# Patient Record
Sex: Male | Born: 1970 | Hispanic: Yes | Smoking: Former smoker
Health system: Southern US, Community
[De-identification: ages and names within clinical notes are randomized; demographics above are authoritative.]

## PROBLEM LIST (undated history)

## (undated) ENCOUNTER — Ambulatory Visit (HOSPITAL_COMMUNITY): Discharge status: Absent without leave | Payer: Self-pay

## (undated) DIAGNOSIS — K429 Umbilical hernia without obstruction or gangrene: Secondary | ICD-10-CM

## (undated) DIAGNOSIS — F419 Anxiety disorder, unspecified: Secondary | ICD-10-CM

## (undated) DIAGNOSIS — J45909 Unspecified asthma, uncomplicated: Secondary | ICD-10-CM

## (undated) DIAGNOSIS — B029 Zoster without complications: Secondary | ICD-10-CM

## (undated) DIAGNOSIS — B009 Herpesviral infection, unspecified: Secondary | ICD-10-CM

## (undated) DIAGNOSIS — K219 Gastro-esophageal reflux disease without esophagitis: Secondary | ICD-10-CM

## (undated) HISTORY — PX: MOUTH SURGERY: SHX715

---

## 2004-10-08 ENCOUNTER — Emergency Department (HOSPITAL_COMMUNITY): Admission: EM | Admit: 2004-10-08 | Discharge: 2004-10-08 | Payer: Self-pay | Admitting: Emergency Medicine

## 2005-04-05 ENCOUNTER — Emergency Department (HOSPITAL_COMMUNITY): Admission: EM | Admit: 2005-04-05 | Discharge: 2005-04-06 | Payer: Self-pay | Admitting: Emergency Medicine

## 2005-04-14 ENCOUNTER — Emergency Department (HOSPITAL_COMMUNITY): Admission: EM | Admit: 2005-04-14 | Discharge: 2005-04-15 | Payer: Self-pay | Admitting: Emergency Medicine

## 2005-06-06 ENCOUNTER — Emergency Department (HOSPITAL_COMMUNITY): Admission: EM | Admit: 2005-06-06 | Discharge: 2005-06-06 | Payer: Self-pay | Admitting: Emergency Medicine

## 2005-07-28 ENCOUNTER — Emergency Department (HOSPITAL_COMMUNITY): Admission: EM | Admit: 2005-07-28 | Discharge: 2005-07-28 | Payer: Self-pay | Admitting: Emergency Medicine

## 2006-02-08 ENCOUNTER — Emergency Department (HOSPITAL_COMMUNITY): Admission: EM | Admit: 2006-02-08 | Discharge: 2006-02-08 | Payer: Self-pay | Admitting: Emergency Medicine

## 2006-08-26 ENCOUNTER — Emergency Department (HOSPITAL_COMMUNITY): Admission: EM | Admit: 2006-08-26 | Discharge: 2006-08-26 | Payer: Self-pay | Admitting: Emergency Medicine

## 2006-09-15 ENCOUNTER — Emergency Department (HOSPITAL_COMMUNITY): Admission: EM | Admit: 2006-09-15 | Discharge: 2006-09-15 | Payer: Self-pay | Admitting: Emergency Medicine

## 2006-09-25 ENCOUNTER — Emergency Department (HOSPITAL_COMMUNITY): Admission: EM | Admit: 2006-09-25 | Discharge: 2006-09-25 | Payer: Self-pay | Admitting: Emergency Medicine

## 2008-01-23 ENCOUNTER — Emergency Department (HOSPITAL_COMMUNITY): Admission: EM | Admit: 2008-01-23 | Discharge: 2008-01-23 | Payer: Self-pay | Admitting: Emergency Medicine

## 2010-05-16 ENCOUNTER — Emergency Department (HOSPITAL_COMMUNITY)
Admission: EM | Admit: 2010-05-16 | Discharge: 2010-05-16 | Disposition: A | Discharge status: Home / Self Care | Payer: Self-pay | Attending: Emergency Medicine | Admitting: Emergency Medicine

## 2010-05-16 DIAGNOSIS — Y929 Unspecified place or not applicable: Secondary | ICD-10-CM | POA: Insufficient documentation

## 2010-05-16 DIAGNOSIS — S91009A Unspecified open wound, unspecified ankle, initial encounter: Secondary | ICD-10-CM | POA: Insufficient documentation

## 2010-05-16 DIAGNOSIS — M79609 Pain in unspecified limb: Secondary | ICD-10-CM | POA: Insufficient documentation

## 2010-05-16 DIAGNOSIS — IMO0002 Reserved for concepts with insufficient information to code with codable children: Secondary | ICD-10-CM | POA: Insufficient documentation

## 2010-05-16 DIAGNOSIS — W268XXA Contact with other sharp object(s), not elsewhere classified, initial encounter: Secondary | ICD-10-CM | POA: Insufficient documentation

## 2010-05-16 DIAGNOSIS — S81009A Unspecified open wound, unspecified knee, initial encounter: Secondary | ICD-10-CM | POA: Insufficient documentation

## 2010-05-16 LAB — GLUCOSE, CAPILLARY: Glucose-Capillary: 119 mg/dL — ABNORMAL HIGH (ref 70–99)

## 2010-05-22 ENCOUNTER — Emergency Department (HOSPITAL_COMMUNITY)
Admission: EM | Admit: 2010-05-22 | Discharge: 2010-05-22 | Disposition: A | Discharge status: Home / Self Care | Payer: Self-pay | Attending: Emergency Medicine | Admitting: Emergency Medicine

## 2010-05-22 DIAGNOSIS — J029 Acute pharyngitis, unspecified: Secondary | ICD-10-CM | POA: Insufficient documentation

## 2010-05-22 DIAGNOSIS — J3489 Other specified disorders of nose and nasal sinuses: Secondary | ICD-10-CM | POA: Insufficient documentation

## 2010-05-22 DIAGNOSIS — R05 Cough: Secondary | ICD-10-CM | POA: Insufficient documentation

## 2010-05-22 DIAGNOSIS — W268XXA Contact with other sharp object(s), not elsewhere classified, initial encounter: Secondary | ICD-10-CM | POA: Insufficient documentation

## 2010-05-22 DIAGNOSIS — S81009A Unspecified open wound, unspecified knee, initial encounter: Secondary | ICD-10-CM | POA: Insufficient documentation

## 2010-05-22 DIAGNOSIS — J069 Acute upper respiratory infection, unspecified: Secondary | ICD-10-CM | POA: Insufficient documentation

## 2010-05-22 DIAGNOSIS — Z09 Encounter for follow-up examination after completed treatment for conditions other than malignant neoplasm: Secondary | ICD-10-CM | POA: Insufficient documentation

## 2010-05-22 DIAGNOSIS — R059 Cough, unspecified: Secondary | ICD-10-CM | POA: Insufficient documentation

## 2010-07-28 ENCOUNTER — Emergency Department (HOSPITAL_COMMUNITY)
Admission: EM | Admit: 2010-07-28 | Discharge: 2010-07-28 | Disposition: A | Discharge status: Home / Self Care | Payer: Self-pay | Attending: Emergency Medicine | Admitting: Emergency Medicine

## 2010-07-28 DIAGNOSIS — H579 Unspecified disorder of eye and adnexa: Secondary | ICD-10-CM | POA: Insufficient documentation

## 2010-07-28 DIAGNOSIS — H5789 Other specified disorders of eye and adnexa: Secondary | ICD-10-CM | POA: Insufficient documentation

## 2010-07-28 DIAGNOSIS — H1089 Other conjunctivitis: Secondary | ICD-10-CM | POA: Insufficient documentation

## 2010-09-24 ENCOUNTER — Emergency Department (HOSPITAL_COMMUNITY)
Admission: EM | Admit: 2010-09-24 | Discharge: 2010-09-24 | Disposition: A | Discharge status: Home / Self Care | Payer: Self-pay | Attending: Emergency Medicine | Admitting: Emergency Medicine

## 2010-09-24 DIAGNOSIS — L293 Anogenital pruritus, unspecified: Secondary | ICD-10-CM | POA: Insufficient documentation

## 2010-09-24 DIAGNOSIS — B356 Tinea cruris: Secondary | ICD-10-CM | POA: Insufficient documentation

## 2010-11-02 LAB — URINALYSIS, ROUTINE W REFLEX MICROSCOPIC
Glucose, UA: NEGATIVE
Hgb urine dipstick: NEGATIVE
Ketones, ur: 15 — AB
Urobilinogen, UA: 1
pH: 6

## 2010-11-02 LAB — URINE CULTURE

## 2011-06-18 ENCOUNTER — Emergency Department (HOSPITAL_COMMUNITY)
Admission: EM | Admit: 2011-06-18 | Discharge: 2011-06-18 | Disposition: A | Discharge status: Home / Self Care | Payer: Self-pay | Attending: Emergency Medicine | Admitting: Emergency Medicine

## 2011-06-18 ENCOUNTER — Encounter (HOSPITAL_COMMUNITY): Payer: Self-pay

## 2011-06-18 DIAGNOSIS — K130 Diseases of lips: Secondary | ICD-10-CM | POA: Insufficient documentation

## 2011-06-18 HISTORY — DX: Herpesviral infection, unspecified: B00.9

## 2011-06-18 LAB — CBC
HCT: 44.5 % (ref 39.0–52.0)
MCH: 30.1 pg (ref 26.0–34.0)
MCHC: 36 g/dL (ref 30.0–36.0)
Platelets: 238 10*3/uL (ref 150–400)

## 2011-06-18 LAB — URINALYSIS, ROUTINE W REFLEX MICROSCOPIC
Glucose, UA: NEGATIVE mg/dL
Ketones, ur: NEGATIVE mg/dL
Nitrite: NEGATIVE
Protein, ur: NEGATIVE mg/dL
Specific Gravity, Urine: 1.02 (ref 1.005–1.030)
Urobilinogen, UA: 0.2 mg/dL (ref 0.0–1.0)

## 2011-06-18 LAB — DIFFERENTIAL
Basophils Absolute: 0 10*3/uL (ref 0.0–0.1)
Lymphocytes Relative: 27 % (ref 12–46)
Lymphs Abs: 2 10*3/uL (ref 0.7–4.0)
Monocytes Absolute: 0.6 10*3/uL (ref 0.1–1.0)
Monocytes Relative: 9 % (ref 3–12)
Neutrophils Relative %: 61 % (ref 43–77)

## 2011-06-18 LAB — BASIC METABOLIC PANEL
Calcium: 9 mg/dL (ref 8.4–10.5)
Chloride: 103 mEq/L (ref 96–112)
GFR calc Af Amer: >90 mL/min (ref >90)
Potassium: 3.7 mEq/L (ref 3.5–5.1)
Sodium: 139 mEq/L (ref 135–145)

## 2011-06-18 MED ORDER — TRAMADOL HCL 50 MG PO TABS: 50.0000 mg | ORAL_TABLET | Freq: Four times a day (QID) | ORAL | Status: AC | PRN

## 2011-06-18 MED ORDER — NAPROXEN 500 MG PO TABS: 500.0000 mg | ORAL_TABLET | Freq: Two times a day (BID) | ORAL | Status: DC

## 2011-06-18 NOTE — ED Provider Notes (Signed)
History     CSN: [id number]  Arrival date & time 06/18/11  1346   First MD Initiated Contact with Patient 06/18/11 1401      Chief Complaint  Patient presents with  . Penis Pain    (Consider location/radiation/quality/duration/timing/severity/associated sxs/prior treatment) Patient is a 41 y.o. male presenting with penile pain. The history is provided by the patient. A language interpreter was used.  Penis Pain  He complains of pain and itching in his mouth, tongue, and groin for the last 2 months. He states he sees blisters on his tongue and sometimes his lips are cracked. His mouth is dry all the time and he has been very thirsty and been drinking a lot. He has been urinating a lot. He thinks that the pain is due 2 herpes. He has also had a groin pain and itching. He denies any discharge and has not noted any blisters in his groin. He denies fever, chills, sweats. He denies nausea, vomiting, diarrhea. Denies arthralgias and myalgias.  Past Medical History  Diagnosis Date  . Herpes     History reviewed. No pertinent past surgical history.  No family history on file.  History  Substance Use Topics  . Smoking status: Never Smoker   . Smokeless tobacco: Not on file  . Alcohol Use: Yes      Review of Systems  Genitourinary: Positive for penile pain.  All other systems reviewed and are negative.    Allergies  Review of patient's allergies indicates no known allergies.  Home Medications  No current outpatient prescriptions on file.  BP 113/45  Pulse 88  Temp(Src) 99.1 F (37.3 C) (Oral)  Resp 14  Ht 5\' 7"  (1.702 m)  Wt 180 lb (81.647 kg)  BMI 28.19 kg/m2  SpO2 98%  Physical Exam  Nursing note and vitals reviewed.  41 year old male resting comfortably and in no acute distress. Vital signs are normal. Oxygen saturation is 98% which is normal. Head is normocephalic and atraumatic. PERRLA, EOMI. Oral cavity appears normal. I do not see any vesicles on the tongue  or buccal mucosa. Lips appear normal without any cracking or redness or vesicles. Neck is nontender and supple without adenopathy. Back is nontender. Lungs are clear without rales, wheezes, or rhonchi. Heart has regular rate and rhythm without murmur. Abdomen is soft, flat, nontender without masses or hepatosplenomegaly. Genitalia: Uncircumcised penis without discharge. Doses are descended without masses. I do not see any scrotal rash. There is no inguinal adenopathy. Extremities have full range of motion, no cyanosis or edema. Skin is warm and dry without rash. Neurologic: Mental status is normal, cranial nerves are intact, there no focal motor or sensory deficits.  ED Course  Procedures (including critical care time)  Results for orders placed during the hospital encounter of 06/18/11  URINALYSIS, ROUTINE W REFLEX MICROSCOPIC      Component Value Range   Color, Urine YELLOW  YELLOW    APPearance CLEAR  CLEAR    Specific Gravity, Urine 1.020  1.005 - 1.030    pH 8.0  5.0 - 8.0    Glucose, UA NEGATIVE  NEGATIVE (mg/dL)   Hgb urine dipstick NEGATIVE  NEGATIVE    Bilirubin Urine NEGATIVE  NEGATIVE    Ketones, ur NEGATIVE  NEGATIVE (mg/dL)   Protein, ur NEGATIVE  NEGATIVE (mg/dL)   Urobilinogen, UA 0.2  0.0 - 1.0 (mg/dL)   Nitrite NEGATIVE  NEGATIVE    Leukocytes, UA NEGATIVE  NEGATIVE   CBC  Component Value Range   WBC 7.3  4.0 - 10.5 (K/uL)   RBC 5.32  4.22 - 5.81 (MIL/uL)   Hemoglobin 16.0  13.0 - 17.0 (g/dL)   HCT 46.9  62.9 - 52.8 (%)   MCV 83.6  78.0 - 100.0 (fL)   MCH 30.1  26.0 - 34.0 (pg)   MCHC 36.0  30.0 - 36.0 (g/dL)   RDW 41.3  24.4 - 01.0 (%)   Platelets 238  150 - 400 (K/uL)  DIFFERENTIAL      Component Value Range   Neutrophils Relative 61  43 - 77 (%)   Neutro Abs 4.4  1.7 - 7.7 (K/uL)   Lymphocytes Relative 27  12 - 46 (%)   Lymphs Abs 2.0  0.7 - 4.0 (K/uL)   Monocytes Relative 9  3 - 12 (%)   Monocytes Absolute 0.6  0.1 - 1.0 (K/uL)   Eosinophils Relative 3   0 - 5 (%)   Eosinophils Absolute 0.2  0.0 - 0.7 (K/uL)   Basophils Relative 0  0 - 1 (%)   Basophils Absolute 0.0  0.0 - 0.1 (K/uL)  BASIC METABOLIC PANEL      Component Value Range   Sodium 139  135 - 145 (mEq/L)   Potassium 3.7  3.5 - 5.1 (mEq/L)   Chloride 103  96 - 112 (mEq/L)   CO2 25  19 - 32 (mEq/L)   Glucose, Bld 97  70 - 99 (mg/dL)   BUN 8  6 - 23 (mg/dL)   Creatinine, Ser 2.72  0.50 - 1.35 (mg/dL)   Calcium 9.0  8.4 - 53.6 (mg/dL)   GFR calc non Af Amer >90  >90 (mL/min)   GFR calc Af Amer >90  >90 (mL/min)    1. Cheilitis       MDM  Dry mouth with the intermittent pain with reports of polyuria and polydipsia worrisome for new onset of diabetes. Urinalysis and metabolic panel will be checked. I do not see any lesions that are suggestive of herpes.   Workup is negative including normal blood sugar. It is possible that his complaints of constant thirst and dry mouth may be early presentation of Sjogren's syndrome. He is referred to ENT for followup of his oral pain and urine prescriptions for naproxen and tramadol.     Dione Booze, MD 06/18/11 [phone]

## 2011-06-18 NOTE — Discharge Instructions (Signed)
I do not see any sign of herpes infection. Follow up with the ENT doctor.  Naproxen and naproxen sodium oral immediate-release tablets Qu es este medicamento? El NAPROXENO es un medicamento antiinflamatorio no esteroideo (AINE). Se utiliza para reducir la inflamacin y Corporate treasurer. Este medicamento se puede Chemical engineer para Nurse, adult, dolor de [place] y perodos menstruales dolorosos. Este medicamento tambin sirve para tratar problemas dolorosos de las articulaciones y los msculos, tales como artritis, tendinitis, bursitis y Secondary school teacher. Este medicamento puede ser utilizado para otros usos; si tiene alguna pregunta consulte con su proveedor de atencin mdica o con su farmacutico. Qu le debo informar a mi profesional de la salud antes de tomar este medicamento? Necesita saber si usted presenta alguno de los siguientes problemas o situaciones: -asma -fuma -consume ms de 3 bebidas alcohlicas por da -enfermedad cardiaca o problemas circulatorios, tales como insuficiencia cardiaca o edema de pierna (retencin de lquido) -alta presin sangunea -enfermedad renal -enfermedad heptica -lceras o sangrado estomacal -una reaccin alrgica o inusual al naproxeno, a la aspirina, a otros AINE, otros medicamentos, alimentos, colorantes o conservantes -si est embarazada o buscando quedar embarazada -si est amamantando a un beb Cmo debo [employer]? Tome este medicamento por va oral con un vaso de agua. Siga las instrucciones de la etiqueta del [place]. Si este medicamento le produce Programme researcher, broadcasting/film/video, tmelo con alimentos. Trate de no acostarse por lo menos 10 minutos despus de tomarlo. Tome sus dosis a intervalos regulares. No tome su medicamento con una frecuencia mayor a la indicada. El uso prolongado puede aumentar el riesgo de sufrir un ataque cardiaco o un derrame cerebral. Su farmacutico le dar una [place] del medicamento especial con cada receta y relleno. Asegrese  de leer esta informacin cada vez cuidadosamente. Hable con su pediatra para informarse acerca del uso de este medicamento en nios. Puede requerir atencin especial. Sobredosis: Pngase en contacto inmediatamente con un centro toxicolgico o una sala de urgencia si usted cree que haya tomado demasiado medicamento. ATENCIN: [employer] es solo para usted. No comparta este medicamento con nadie. Qu sucede si me olvido de una dosis? Si olvida una dosis, tmela lo antes posible. Si es casi la hora de la prxima dosis, tome slo esa dosis. No tome dosis adicionales o dobles. Qu puede interactuar con este medicamento? -alcohol -aspirina -cidofovir -diurticos -litio -metotrexato -otros medicamentos antiinflamatorios, tales Teacher, English as a foreign language o prednisona -pemetrexed -probenecid -warfarina Puede ser que esta lista no menciona todas las posibles interacciones. Informe a su profesional de [employer] de [employer] productos a base de hierbas, medicamentos de [place] o suplementos nutritivos que est tomando. Si usted fuma, consume bebidas alcohlicas o si utiliza drogas ilegales, indqueselo tambin a su profesional de [employer]. Algunas sustancias pueden interactuar con su medicamento. A qu debo estar atento al usar [employer]? Si el dolor no mejora, informe a su mdico o a su profesional de [employer]. Consulte con su mdico antes de tomar otros analgsicos. No se trate usted mismo. Este medicamento no previene ataques cardiacos o derrames cerebrales. De hecho, este medicamento puede aumentar la posibilidad de Marine scientist un ataque cardiaco o un derrame cerebral. La posibilidad puede aumentar con el uso prolongado de este medicamento y en pacientes con enfermedad cardiaca. Si est tomando aspirina para la prevencin de ataques cardiacos o derrames cerebrales, comunquese con su mdico o su profesional de [employer]. Evite tomar otros medicamentos que contienen aspirina, ibuprofeno o naproxeno  con [employer]. Es probable que se  produzcan efectos secundarios, tales como molestias estomacales, nuseas o lceras. No debe de tomar [employer] con muchos medicamentos disponibles de [employer]. Este medicamento puede provocar lceras y hemorragia del estmago e intestinos en cualquier momento durante tratamiento. No fume ni ingiera alcohol. Esto irrita an ms el estmago y puede hacerlo ms susceptible a dao por el uso de [employer]. Pueden ocurrir lceras y hemorragia sin sntomas de Control and instrumentation engineer y Financial controller la [place]. Puede experimentar mareos o somnolencia. No conduzca ni utilice maquinaria ni haga nada que Scientist, research (life sciences) en estado de alerta hasta que sepa cmo le afecta este medicamento. No se siente ni se ponga de pie con rapidez, especialmente si es un paciente de edad avanzada. Esto reduce el riesgo de mareos o [employer]. Este medicamento puede hacerle sangrar con mayor facilidad. Trate de no lastimarse los dientes y las encas al cepillarlos o limpiarlos con hilo dental. Qu efectos secundarios puedo tener al [employer] este medicamento? Efectos secundarios que debe informar a su mdico o a Producer, television/film/video de la salud tan pronto como sea posible: -heces de color oscuro o con sangre, sangre en la orina o vmito con sangre -visin borrosa -dolor en el pecho -dificultad al respirar o sibilancias -nuseas o vmito -dolor de estmago severo -erupcin cutnea, enrojecimiento, ampollas o descamacin de la piel, urticarias o picazn -hablar arrastrando las palabras o debilidad en un lado del cuerpo -hinchazn de prpados, garganta o labios -aumento de peso o hinchazn que no tienen explicacin -cansancio o debilidad inusual -color amarillento de los ojos o la piel Efectos secundarios que, por lo general, no requieren atencin mdica (debe informarlos a su mdico o a su profesional de la salud si persisten o si son molestos): -estreimiento -dolor de cabeza -acidez de  estmago Puede ser que esta lista no menciona todos los posibles efectos secundarios. Comunquese a su mdico por asesoramiento mdico [employer]. Usted puede informar los efectos secundarios a la FDA por telfono al 1-800-FDA-1088. Dnde debo guardar mi medicina? Mantngala fuera del alcance de los nios. Gurdela a [employer], entre 15 y 30 grados C (14 y 48 grados F). Mantenga le envase bien cerrado. Deseche todo el medicamento que no haya utilizado, despus de la fecha de vencimiento. ATENCIN: Este folleto es un resumen. Puede ser que no cubra toda la posible informacin. Si usted tiene preguntas acerca de esta medicina, consulte con su mdico, su farmacutico o su profesional de Radiographer, therapeutic.  2012, Elsevier/Gold Standard. (02/10/2009 4:00:30 PM)  Tramadol tablets Qu es este medicamento? El TRAMADOL es un analgsico. Se utiliza para tratar dolores moderados o severos en adultos. Este medicamento puede ser utilizado para otros usos; si tiene alguna pregunta consulte con su proveedor de atencin mdica o con su farmacutico. Qu le debo informar a mi profesional de la salud antes de tomar este medicamento? Necesita saber si usted presenta alguno de los [employer] o situaciones: -tumor cerebral -depresin  -abuso de drogas o drogadiccin -lesin de la cabeza -si consume bebidas alcohlicas con frecuencia -enfermedad renal o problemas al orinar -enfermedad heptica -enfermedad pulmonar, asma o problemas respiratorios -convulsiones o epilepsia -ideas suicidas, planes o intento; si usted o alguien de su familia ha intentado un suicidio previo -una reaccin alrgica o inusual al tramadol, a la codena, a otros medicamentos, alimentos, colorantes o conservantes -si est embarazada o buscando quedar embarazada -si est amamantando a un beb Cmo debo utilizar este medicamento? Tome este medicamento por va oral con un vaso lleno de agua.  Siga las  instrucciones de la etiqueta del [place]. Si el Social worker, tmelo con alimentos o con [place]. No tome su medicamento con una frecuencia mayor que la indicada. Hable con su pediatra para informarse acerca del uso de este medicamento en nios. Puede requerir atencin especial. Sobredosis: Pngase en contacto inmediatamente con un centro toxicolgico o una sala de urgencia si usted cree que haya tomado demasiado medicamento. ATENCIN: [employer] es solo para usted. No comparta este medicamento con nadie. Qu sucede si me olvido de una dosis? Si olvida una dosis, tmela lo antes posible. Si es casi la hora de la prxima dosis, tome slo esa dosis. No tome dosis adicionales o dobles. Qu puede interactuar con este medicamento? No tome esta medicina con ninguno de los siguientes medicamentos: -IMAOs, tales como Carbex, Eldepryl, Marplan, [place] y [place] Esta medicina tambin puede interactuar con los siguientes medicamentos: -alcohol o medicamentos que contienen alcohol -antihistamnicos -benzodiacepinas -bupropion -carbamazepina u oxcarbazepina -clozapina -ciclobenzaprina -digoxina -furazolidona -linezolid -medicamentos para la depresin, ansiedad o trastornos psicticos -medicamentos para las migraas, tales como almotriptn, eletriptn, frovatriptn, naratriptn, rizatriptn, sumatriptn, zolmitriptn -analgsicos, incluyendo pentazocina, buprenorfina, butorfanol, meperidina, nalbufina y propoxifeno -medicamentos para conciliar el sueo -relajantes musculares -naltrexona -fenobarbital -fenotiazinas, tales como perfenacina, tioridazina, clorpromacina, mesoridazina, flufenazina, proclorperazina, promazina y trifluoperazina -procarbazina -warfarina Puede ser que esta lista no menciona todas las posibles interacciones. Informe a su profesional de [employer] de [employer] productos a base de hierbas, medicamentos de [place] o suplementos nutritivos  que est tomando. Si usted fuma, consume bebidas alcohlicas o si utiliza drogas ilegales, indqueselo tambin a su profesional de [employer]. Algunas sustancias pueden interactuar con su medicamento. A qu debo estar atento al usar [employer]? Si el dolor no desaparece, si empeora o si experimenta un dolor nuevo o de tipo diferente, consulte a su mdico o a su profesional de [employer]. Usted puede desarrollar tolerancia al medicamento. La tolerancia significa que necesitar una dosis ms alta para Engineer, materials. Tolerancia es normal y esperada cuando est tomando este medicamento por un largo perodo de [place]. No suspenda el uso de su medicamento repentinamente debido a que puede Copywriter, advertising reaccin severa. Su cuerpo se acostumbra a Industrial/product designer. Esto NO significa que sea adicto. La adiccin es un comportamiento que hace referencia a la obtencin y utilizacin de un medicamento con fines que no son mdicos. Si tiene Engineer, mining, existe una razn mdica para que usted tome un analgsico. Su mdico le indicar la cantidad de medicamento que Mudlogger. Si su mdico desea que [employer], la dosis ser reducida gradualmente para Psychiatric nurse secundarios. Puede experimentar mareos o somnolencia. No conduzca ni utilice maquinaria, ni haga nada que Scientist, research (life sciences) en estado de alerta hasta que sepa cmo le afecta este medicamento. No se siente ni se ponga de pie con rapidez, especialmente si es un paciente de edad avanzada. Esto reduce el riesgo de mareos o [employer]. El alcohol puede aumentar o disminuir el efecto de [place]. Evite consumir bebidas alcohlicas. Este medicamento puede causar estreimiento. Trate de evacuar los intestinos al menos cada 2  3 das. Si no evacua los intestinos durante [address]  [address], comunquese con su mdico o con su profesional de [employer]. Se le podr secar la boca. Masticar chicle sin azcar, chupar caramelos duros y tomar agua en abundancia le  ayudar a mantener la boca hmeda. Si el problema no desaparece o es severo, consulte a su mdico. Qu efectos secundarios puedo  tener al [employer] este medicamento? Efectos secundarios que debe informar a su mdico o a Producer, television/film/video de la salud tan pronto como sea posible: -Therapist, art como erupcin cutnea, picazn o urticarias, hinchazn de la cara, labios o lengua -dificultades respiratorias, sibilancias -confusin -picazn -aturdimiento o desmayos -enrojecimiento, formacin de ampollas, descamacin o aflojamiento de la piel, inclusive dentro de la boca -convulsiones Efectos secundarios que, por lo general, no requieren atencin mdica (debe informarlos a su mdico o a su profesional de la salud si persisten o si son molestos): -estreimiento -mareos -somnolencia -dolor de cabeza -nuseas, vmitos Puede ser que esta lista no menciona todos los posibles efectos secundarios. Comunquese a su mdico por asesoramiento mdico [employer]. Usted puede informar los efectos secundarios a la FDA por telfono al 1-800-FDA-1088. Dnde debo guardar mi medicina? Mantngala fuera del alcance de los nios. Gurdela a [employer], entre 15 y 30 grados C (41 y 43 grados F). Mantenga el envase bien cerrado. Deseche los medicamentos que no haya utilizado, despus de la fecha de vencimiento. ATENCIN: Este folleto es un resumen. Puede ser que no cubra toda la posible informacin. Si usted tiene preguntas acerca de esta medicina, consulte con su mdico, su farmacutico o su profesional de Radiographer, therapeutic.  2012, Elsevier/Gold Standard. (10/15/2009 5:11:17 PM)

## 2011-06-18 NOTE — ED Notes (Signed)
Pt presents with 2 month h/o penile pain.  Pt reports "I have herpes" reports to mouth and penis.

## 2011-06-28 ENCOUNTER — Encounter (HOSPITAL_COMMUNITY): Payer: Self-pay

## 2011-06-28 ENCOUNTER — Emergency Department (HOSPITAL_COMMUNITY)
Admission: EM | Admit: 2011-06-28 | Discharge: 2011-06-28 | Disposition: A | Discharge status: Home / Self Care | Payer: Self-pay | Attending: Emergency Medicine | Admitting: Emergency Medicine

## 2011-06-28 DIAGNOSIS — R220 Localized swelling, mass and lump, head: Secondary | ICD-10-CM

## 2011-06-28 DIAGNOSIS — K0510 Chronic gingivitis, plaque induced: Secondary | ICD-10-CM | POA: Insufficient documentation

## 2011-06-28 NOTE — ED Notes (Signed)
Lips are swollen and red. Airway patent

## 2011-06-28 NOTE — Discharge Instructions (Signed)
Gingivitis  (Gingivitis) La gingivitis es una forma de enfermedad de las encas (periodontal) que causa enrojecimiento, dolor e hinchazn (inflamacin).  CAUSAS  La causa ms comn es la mala higiene bucal. Se deposita una sustancia pegajosa formada por bacterias, mucus y partculas de comida (placa) se deposita en la parte expuesta del diente. A medida que la placa se forma, reacciona con la saliva dentro de la boca para formar lo que se denomina sarro .El sarro es un depsito duro que queda pegado alrededor de la base del diente. La placa y el sarro irritan las encas, y conduce a la aparicin de la gingivitis. Otros factores que aumentan el riesgo de gingivitis son:   Consumo de tabaco.   Diabetes.   La edad avanzada.   Ciertos medicamentos.   Ciertas infecciones virales o por hongos.   [employer].   Cambios hormonales [employer] se producen durante el [place].   Dficit nutricional.   Abuso de drogas.   Arreglos dentales o prtesis que no ajustan bien.  SNTOMAS  Nota inflamacin del tejido blando (gingiva) alrededor del diente. Cuando estos tejidos se inflaman, sangran con facilidad, especialmente durante la limpieza con hilo dental o el cepillado. Tambin las encas podrn estar:   Sensibles al tacto.   Tener un color rojo brillante, prpura o estar brillantes.   Hinchadas   Separarse del diente (retroceder) lo que hace que el diente se exponga an ms.  Con frecuencia hay mal aliento. Una infeccin continua alrededor del diente puede Interior and spatial designer caries y hacer que el diente se afloje. Esto puede causarle la prdida del diente.  DIAGNSTICO  Deben tomarle una historia clnica y dental. Le examinarn la boca, los dientes y las encas. El dentista observar las encas para ver si estn blandas, hinchadas, de color prpura o rojas o irritadas. Puede haber depsitos de placa y sarro en la base del diente. Le evaluarn las encas para ver el grado de irritacin,  hinchazn o tendencia a la hemorragia. El dentista controlar si hay dientes flojos. Le tomarn radiografas para ver si la inflamacin se ha extendido a Nurse, learning disability.  TRATAMIENTO  El [place] del tratamiento es reducir y Hydrographic surveyor. Un tratamiento adecuado puede [employer] sntomas de la gingivitis y evitar el avance de la enfermedad. Hgase una limpieza de dientes. Durante la limpieza le quitarn toda la placa y el sarro. Le darn instrucciones para un cuidado Librarian, academic. Necesitar limpiezas profesionales y Forensic scientist futuro.  CUIDADOS EN EL HOGAR   Cepllese los dientes al [employer] veces por da y psese el hilo dental al menos una vez por da. Cuando se pase el hilo dental, es conveniente hacerlo antes del cepillado.   Limite el consumo de azcar entre comidas y Mindi Slicker dieta equilibrada.   La mejor higiene bucal no impedir que la placa se desarrolle. Es necesario que consulte con su odontlogo regularmente, segn se lo sugieran para Education officer, environmental limpiezas y Paediatric nurse.   El dentista lo asesorar acerca de la correcta higiene bucal y el cuidado de la boca y sugerir dentfricos o enjuagues bucales.   Deje de fumar.  SOLICITE ATENCIN ODONTOLGICA O MDICA SI:   Siente dolor u observa enrojecimiento en los tejidos que rodean los dientes o tiene las encas hinchadas.   Tiene dificultad para Product manager.   Nota que algn diente est flojo o infectado.   Tiene los ganglios hinchados.   Las [employer] sangran con facilidad cuando  se cepilla los dientes o las siente muy sensibles al tacto.  Document Released: 01/04/2005 Document Revised: 12/24/2010 [facility] Patient Information 2012 [place], [place].

## 2011-06-28 NOTE — ED Provider Notes (Signed)
History    This chart was scribed for Gabriel Gaskins, MD, MD by Smitty Pluck. The patient was seen in room STRE5 and the patient's care was started at 2:47PM.   CSN: [id number]  Arrival date & time 06/28/11  1420   First MD Initiated Contact with Patient 06/28/11 1445      Chief Complaint  Patient presents with  . Facial Swelling     The history is provided by the patient. A language interpreter was used.   Iam Lipson is a 41 y.o. male who presents to the Emergency Department complaining of intermittent mild lip swelling, itching and pain onset 2 months ago. Pt reports not responding to medication. Denies vomiting, difficulty breathing, rash and insect bites. Pt reports mild fever. There is no radiation of pain. Pt denies any other pain. Pt reports having swollen and dry but symptoms have improved.  Interpreter used for HPI.   Past Medical History  Diagnosis Date  . Herpes     History reviewed. No pertinent past surgical history.  No family history on file.  History  Substance Use Topics  . Smoking status: Never Smoker   . Smokeless tobacco: Not on file  . Alcohol Use: Yes      Review of Systems  Constitutional: Positive for fever. Negative for diaphoresis.  HENT: Negative for facial swelling.   Gastrointestinal: Negative for nausea and vomiting.    Allergies  Review of patient's allergies indicates no known allergies.  Home Medications   Current Outpatient Rx  Name Route Sig Dispense Refill  . DIPHENHYDRAMINE HCL 25 MG PO CAPS Oral Take 25 mg by mouth every 6 (six) hours as needed. For swelling/itching    . NAPROXEN 500 MG PO TABS Oral Take 1 tablet (500 mg total) by mouth 2 (two) times daily. 30 tablet 0  . TRAMADOL HCL 50 MG PO TABS Oral Take 1 tablet (50 mg total) by mouth every 6 (six) hours as needed for pain. 20 tablet 0    BP 113/64  Pulse 74  Temp(Src) 98.9 F (37.2 C) (Oral)  Resp 16  SpO2 100%  Physical Exam  Nursing note and vitals  reviewed.  CONSTITUTIONAL: Well developed/well nourished HEAD AND FACE: Normocephalic/atraumatic EYES: EOMI/PERRL ENMT: Mucous membranes moist, no angioedema, tongue appears normal, no herpetic lesions, no facial swelling, uvula midline.   Poor dentition (pt reports gum bleeding) NECK: supple no meningeal signs SPINE:entire spine nontender CV: S1/S2 noted, no murmurs/rubs/gallops noted LUNGS: Lungs are clear to auscultation bilaterally, no apparent distress ABDOMEN: soft, nontender, no rebound or guarding GU:no cva tenderness NEURO: Pt is awake/alert, moves all extremitiesx4 EXTREMITIES: pulses normal, full ROM SKIN: warm, color normal PSYCH: no abnormalities of mood noted  ED Course  Procedures  DIAGNOSTIC STUDIES: Oxygen Saturation is 100% on room air, normal by my interpretation.    COORDINATION OF CARE: 2:52PM EDP discusses pt ED treatment with pt and pt post ED treatment.  Pt essentially has normal exam For gingivitis referred to dental Similar to prior ED visit, I have referred to ENT as possibility of sjogren Pt has no signs of acute infection/allergic rxn to face  The patient appears reasonably screened and/or stabilized for discharge and I doubt any other medical condition or other [facility] requiring further screening, evaluation, or treatment in the ED at this time prior to discharge.     MDM  Nursing notes including past medical history, social history and family history reviewed and considered in documentation Previous records reviewed and considered -  previous ED visit reviewed    I personally performed the services described in this documentation, which was scribed in my presence. The recorded information has been reviewed and considered.         Gabriel Gaskins, MD 06/28/11 [phone]

## 2011-07-23 ENCOUNTER — Encounter (HOSPITAL_COMMUNITY): Payer: Self-pay | Admitting: Physical Medicine and Rehabilitation

## 2011-07-23 ENCOUNTER — Emergency Department (HOSPITAL_COMMUNITY)
Admission: EM | Admit: 2011-07-23 | Discharge: 2011-07-23 | Disposition: A | Discharge status: Home / Self Care | Payer: Self-pay | Attending: Emergency Medicine | Admitting: Emergency Medicine

## 2011-07-23 DIAGNOSIS — G510 Bell's palsy: Secondary | ICD-10-CM | POA: Insufficient documentation

## 2011-07-23 MED ORDER — ACYCLOVIR 400 MG PO TABS: 400.0000 mg | ORAL_TABLET | Freq: Every day | ORAL | Status: AC

## 2011-07-23 MED ORDER — OXYCODONE-ACETAMINOPHEN 5-325 MG PO TABS: 1.0000 | ORAL_TABLET | Freq: Four times a day (QID) | ORAL | Status: AC | PRN

## 2011-07-23 MED ORDER — TETRACAINE HCL 0.5 % OP SOLN
2.0000 [drp] | Freq: Once | OPHTHALMIC | Status: AC
  Administered 2011-07-23: 2 [drp] via OPHTHALMIC
  Filled 2011-07-23: qty 2

## 2011-07-23 MED ORDER — FLUORESCEIN SODIUM 1 MG OP STRP
1.0000 | ORAL_STRIP | Freq: Once | OPHTHALMIC | Status: AC
  Administered 2011-07-23: 1 via OPHTHALMIC
  Filled 2011-07-23: qty 1

## 2011-07-23 MED ORDER — ACYCLOVIR 200 MG PO CAPS
400.0000 mg | ORAL_CAPSULE | Freq: Once | ORAL | Status: AC
  Administered 2011-07-23: 400 mg via ORAL
  Filled 2011-07-23: qty 2

## 2011-07-23 NOTE — ED Notes (Signed)
Pt presents to department for evaluation of R eye pain. Ongoing x2 days. Pt speaks some english. States R eye has been watery and irritated. Also states blurred vision. Pupils equal and reactive. Pt is conscious alert and oriented x4. 5/10 pain at the time.

## 2011-07-23 NOTE — ED Provider Notes (Signed)
History     CSN: [id number]  Arrival date & time 07/23/11  1242   First MD Initiated Contact with Patient 07/23/11 1331      Chief Complaint  Patient presents with  . Eye Pain    (Consider location/radiation/quality/duration/timing/severity/associated sxs/prior treatment) HPI History via [employer] interpreter. Pt reports onset of severe R sided face pain 4 days ago, associated with numbness, but no swelling. He has had pain and tearing in his R eye. He denies any injury. He had similar symptoms about 13 years ago which he attributes to "stroke" but from his description was probably Bell's Palsy. He has been unable to sleep.    Past Medical History  Diagnosis Date  . Herpes     No past surgical history on file.  History reviewed. No pertinent family history.  History  Substance Use Topics  . Smoking status: Never Smoker   . Smokeless tobacco: Not on file  . Alcohol Use: Yes      Review of Systems All other systems reviewed and are negative except as noted in HPI.   Allergies  Review of patient's allergies indicates no known allergies.  Home Medications  No current outpatient prescriptions on file.  BP 119/70  Pulse 91  Temp 98.9 F (37.2 C) (Oral)  Resp 20  SpO2 100%  Physical Exam  Nursing note and vitals reviewed. Constitutional: He is oriented to person, place, and time. He appears well-developed and well-nourished.  HENT:  Head: Normocephalic and atraumatic.  Right Ear: Tympanic membrane and ear canal normal.  Left Ear: Tympanic membrane and ear canal normal.  Eyes: EOM are normal. Pupils are equal, round, and reactive to light. Right eye exhibits no discharge. Left eye exhibits no discharge. No scleral icterus.       Mild R conjunctival injection, fluorescein neg for abrasion  Neck: Normal range of motion. Neck supple.  Cardiovascular: Normal rate, normal heart sounds and intact distal pulses.   Pulmonary/Chest: Effort normal and breath sounds normal.    Abdominal: Bowel sounds are normal. He exhibits no distension. There is no tenderness.  Musculoskeletal: Normal range of motion. He exhibits no edema and no tenderness.  Neurological: He is alert and oriented to person, place, and time. He has normal strength and normal reflexes. A cranial nerve deficit (Slight flattening of the R nasolabial fold, and decreased strength of R eyelid) is present. No sensory deficit. Coordination and gait normal.  Skin: Skin is warm and dry. No rash noted.  Psychiatric: He has a normal mood and affect.    ED Course  Procedures (including critical care time)  Labs Reviewed - No data to display No results found.   No diagnosis found.    MDM  Symptoms consistent with early and mild R Bell's Palsy. Will Rx Acyclovir, pain medications. Advised to tape eye shut at night.         Charles B. Bernette Mayers, MD 07/23/11 1408

## 2011-07-23 NOTE — ED Notes (Signed)
Patient to [facility] ED with C/O right eye pain.  Patient is non-english speaking. [employer] interpreters contacted.

## 2011-07-29 ENCOUNTER — Encounter (HOSPITAL_COMMUNITY): Payer: Self-pay | Admitting: Nurse Practitioner

## 2011-07-29 ENCOUNTER — Emergency Department (HOSPITAL_COMMUNITY)
Admission: EM | Admit: 2011-07-29 | Discharge: 2011-07-29 | Disposition: A | Discharge status: Home / Self Care | Payer: Self-pay | Attending: Emergency Medicine | Admitting: Emergency Medicine

## 2011-07-29 DIAGNOSIS — R21 Rash and other nonspecific skin eruption: Secondary | ICD-10-CM | POA: Insufficient documentation

## 2011-07-29 DIAGNOSIS — F17200 Nicotine dependence, unspecified, uncomplicated: Secondary | ICD-10-CM | POA: Insufficient documentation

## 2011-07-29 MED ORDER — PREDNISONE 20 MG PO TABS: 40.0000 mg | ORAL_TABLET | Freq: Every day | ORAL | Status: AC

## 2011-07-29 MED ORDER — PREDNISONE 20 MG PO TABS
60.0000 mg | ORAL_TABLET | Freq: Once | ORAL | Status: AC
  Administered 2011-07-29: 60 mg via ORAL
  Filled 2011-07-29: qty 3

## 2011-07-29 NOTE — ED Provider Notes (Signed)
History   This chart was scribed for Raeford Razor, MD by Charolett Bumpers . The patient was seen in room TR04C/TR04C.    CSN: [id number]  Arrival date & time 07/29/11  1215   First MD Initiated Contact with Patient 07/29/11 1231      Chief Complaint  Patient presents with  . Rash    (Consider location/radiation/quality/duration/timing/severity/associated sxs/prior treatment) HPI Translated by son.   Gabriel Ball is a 41 y.o. male who presents to the Emergency Department complaining of constant, moderate rash to his left armpit and chest for the past 2 day. Pt reports that the rash is itchy but denies any pain. Pt states that the rash is worsening. Pt denies any fevers. Pt denies any difficulty breathing, but reports an associated sore throat. Pt denies any cough.  Pt denies any new medications but started taking Acyclovir last week for Bells palsy on July 5th as well as Percocet.  Pt denies any other new exposures.     Past Medical History  Diagnosis Date  . Herpes     History reviewed. No pertinent past surgical history.  History reviewed. No pertinent family history.  History  Substance Use Topics  . Smoking status: Current Some Day Smoker  . Smokeless tobacco: Not on file  . Alcohol Use: Yes     rare      Review of Systems  Constitutional: Negative for fever.  HENT: Positive for sore throat.   Respiratory: Negative for cough and shortness of breath.   Skin: Positive for rash.    Allergies  Review of patient's allergies indicates no known allergies.  Home Medications   Current Outpatient Rx  Name Route Sig Dispense Refill  . ACYCLOVIR 400 MG PO TABS Oral Take 1 tablet (400 mg total) by mouth 5 (five) times daily. 50 tablet 0  . OXYCODONE-ACETAMINOPHEN 5-325 MG PO TABS Oral Take 1-2 tablets by mouth every 6 (six) hours as needed for pain. 20 tablet 0    BP 113/69  Pulse 85  Temp 98.6 F (37 C) (Oral)  Resp 18  SpO2 99%  Physical Exam    Nursing note and vitals reviewed. Constitutional: He is oriented to person, place, and time. He appears well-developed and well-nourished. No distress.  HENT:  Head: Normocephalic and atraumatic.  Mouth/Throat: Oropharynx is clear and moist. No oropharyngeal exudate.  Eyes: Conjunctivae and EOM are normal. Pupils are equal, round, and reactive to light.  Neck: Neck supple. No tracheal deviation present.  Cardiovascular: Normal rate, regular rhythm and normal heart sounds.   No murmur heard. Pulmonary/Chest: Effort normal and breath sounds normal. No respiratory distress. He has no wheezes.  Musculoskeletal: Normal range of motion.  Neurological: He is alert and oriented to person, place, and time. He exhibits normal muscle tone. Coordination normal.       Slight R eye ptosis and facial droop. CN otherwise intact.  Skin: Skin is warm and dry. Rash noted.       Scattered slightly raised rash to the upper trunk, worse in central chest, extending to axilla. No mucous membrane involvement.   Psychiatric: He has a normal mood and affect. His behavior is normal.    ED Course  Procedures (including critical care time)  DIAGNOSTIC STUDIES: Oxygen Saturation is 99% on room air, normal by my interpretation.    COORDINATION OF CARE:  1237: Discussed planned course of treatment with the patient, who is agreeable at this time. Discussed stopping Acyclovir and starting pt on steroids.  1245: Medication Orders: Prednisone (Deltasone) tablet 60 mg-once.    Labs Reviewed - No data to display No results found.   1. Rash and nonspecific skin eruption       MDM  41 year old gentleman with pruritic rash. Nonspecific. This pain possibly be a drug rash. Patient was recently started on acyclovir for Bell's palsy. Given the questionable efficacy of antivirals for bells palsy and possible precipitator of rash, will have patient discontinue. Will place him course of steroids. This may potentially help  his Bell's palsy as well. There is no evidence of airway compromise. No mucous membrane or her ocular involvement. Return precautions discussed. Outpatient followup.  I personally preformed the services scribed in my presence. The recorded information has been reviewed and considered. Raeford Razor, MD.         Raeford Razor, MD 07/29/11 [phone]

## 2011-07-29 NOTE — ED Notes (Signed)
Pt reports itchy rash under L armpit and spreading onto chest x 2 days. No new products but did start taking acyclovir last week for bells palsy.

## 2011-07-29 NOTE — ED Notes (Signed)
Red, pin point itchy rash to chest and left armpit. Pt in no respiratory distress.

## 2011-08-29 ENCOUNTER — Emergency Department (HOSPITAL_COMMUNITY)
Admission: EM | Admit: 2011-08-29 | Discharge: 2011-08-29 | Disposition: A | Discharge status: Home / Self Care | Payer: Self-pay | Attending: Emergency Medicine | Admitting: Emergency Medicine

## 2011-08-29 ENCOUNTER — Encounter (HOSPITAL_COMMUNITY): Payer: Self-pay | Admitting: Physical Medicine and Rehabilitation

## 2011-08-29 ENCOUNTER — Emergency Department (HOSPITAL_COMMUNITY): Payer: Self-pay

## 2011-08-29 DIAGNOSIS — IMO0001 Reserved for inherently not codable concepts without codable children: Secondary | ICD-10-CM

## 2011-08-29 DIAGNOSIS — S41111A Laceration without foreign body of right upper arm, initial encounter: Secondary | ICD-10-CM

## 2011-08-29 DIAGNOSIS — S62639A Displaced fracture of distal phalanx of unspecified finger, initial encounter for closed fracture: Secondary | ICD-10-CM | POA: Insufficient documentation

## 2011-08-29 DIAGNOSIS — S39012A Strain of muscle, fascia and tendon of lower back, initial encounter: Secondary | ICD-10-CM

## 2011-08-29 DIAGNOSIS — S335XXA Sprain of ligaments of lumbar spine, initial encounter: Secondary | ICD-10-CM | POA: Insufficient documentation

## 2011-08-29 DIAGNOSIS — Y92009 Unspecified place in unspecified non-institutional (private) residence as the place of occurrence of the external cause: Secondary | ICD-10-CM | POA: Insufficient documentation

## 2011-08-29 DIAGNOSIS — W1789XA Other fall from one level to another, initial encounter: Secondary | ICD-10-CM | POA: Insufficient documentation

## 2011-08-29 DIAGNOSIS — S41109A Unspecified open wound of unspecified upper arm, initial encounter: Secondary | ICD-10-CM | POA: Insufficient documentation

## 2011-08-29 DIAGNOSIS — F17200 Nicotine dependence, unspecified, uncomplicated: Secondary | ICD-10-CM | POA: Insufficient documentation

## 2011-08-29 MED ORDER — IBUPROFEN 800 MG PO TABS: 800.0000 mg | ORAL_TABLET | Freq: Three times a day (TID) | ORAL | Status: AC | PRN

## 2011-08-29 MED ORDER — BACITRACIN-NEOMYCIN-POLYMYXIN 400-5-5000 EX OINT: TOPICAL_OINTMENT | Freq: Two times a day (BID) | CUTANEOUS | Status: AC

## 2011-08-29 MED ORDER — BACITRACIN-NEOMYCIN-POLYMYXIN 400-5-5000 EX OINT
TOPICAL_OINTMENT | Freq: Once | CUTANEOUS | Status: AC
  Administered 2011-08-29: 1 via TOPICAL
  Filled 2011-08-29: qty 1

## 2011-08-29 NOTE — ED Notes (Signed)
Pt presents to department for evaluation of R first finger injury. States he was "scratched" by a nail x3 days ago. Red area with yellow discharge also noted to R forearm. R first finger red and painful to touch. Tetanus x1 year ago. Does not speak english, son at bedside to translate.

## 2011-08-29 NOTE — ED Notes (Signed)
Interpreter phone used to explain discharge instructions. Pt had no further questions. Wound care instructions explained. Pt encouraged to follow up with PCP if symptoms worsen.

## 2011-08-29 NOTE — ED Provider Notes (Signed)
History     CSN: [id number]  Arrival date & time 08/29/11  1324   First MD Initiated Contact with Patient 08/29/11 0831      Chief Complaint  Patient presents with  . Finger Injury   HPI:  Patient fell 3 days ago.  He was leaning up against some decking when it broke and he fell to the pavement.  During the fall, a nail lacerated the medial aspect of his right arm and he injured his right fingers when they hit the pavement.  He also injured his back with the fall and was unable to walk for a day because of the back pain.  He has had no numbness or weakness in his legs and no urinary problems.  He has been washing his wounds each day with soap and water and he has been using an OTC antibiotic ointment on his arm wound.  It began producing "puss" 1 day ago.  He received vaccinations as a child and received a Td booster 1 year ago for an injury.  Past Medical History  Diagnosis Date  . Herpes     No past surgical history on file.  History reviewed. No pertinent family history.  History  Substance Use Topics  . Smoking status: Current Some Day Smoker  . Smokeless tobacco: Not on file  . Alcohol Use: Yes     rare      Review of Systems  All other systems reviewed and are negative.    Allergies  Review of patient's allergies indicates no known allergies.  Home Medications  No current outpatient prescriptions on file.  BP 106/74  Pulse 65  Temp 98.5 F (36.9 C) (Oral)  Resp 16  SpO2 98%  Physical Exam  Constitutional: He appears well-developed and well-nourished. No distress.  Cardiovascular:  Pulses:      Radial pulses are 2+ on the right side, and 2+ on the left side.  Musculoskeletal:       Right shoulder: Normal.       Left shoulder: Normal.       Right elbow: Normal.      Left elbow: Normal.       Right wrist: Normal.       Left wrist: Normal.       Cervical back: Normal.       Thoracic back: Normal.       Lumbar back: He exhibits tenderness and bony  tenderness. He exhibits no swelling, no deformity and no spasm.       Right upper arm: He exhibits laceration (4cm long with 4cm diameter area of surround erythema). He exhibits no tenderness, no bony tenderness and no deformity.       Left upper arm: Normal.       Right forearm: He exhibits tenderness. He exhibits no bony tenderness, no swelling, no edema, no deformity and no laceration.       Left forearm: Normal.       Right hand: He exhibits decreased range of motion (inability to flex 2nd digit completely), tenderness (distal phalanx of 2nd and 4th digits, DIP 4th digit) and laceration (distal 2nd digit). He exhibits no deformity.       Left hand: Normal.  Neurological: He has normal strength.    ED Course  Procedures (including critical care time)   9:18 AM - Radiographs of lumbar spine and right hand ordered.  10:43 AM - Right upper arm wound dressed and splint applied to finger.  Used interpreter to  instruct patient on care of his finger and wound.  Patient acknowledged understanding.  Pt also educated on signs of worsening infections and instructed to return to the ED if wound worsens or fails to improve.   Labs Reviewed - No data to display Dg Lumbar Spine Complete  08/29/2011  *RADIOLOGY REPORT*  Clinical Data: Fall, lower back pain  LUMBAR SPINE - COMPLETE 4+ VIEW  Comparison: None.  Findings: Five lumbar-type vertebral bodies.  Normal lumbar lordosis.  No evidence of fracture or dislocation.  Vertebral body heights and intervertebral disc spaces are maintained.  Mild multilevel degenerative changes.  IMPRESSION: No fracture or dislocation is seen.  Mild multilevel degenerative changes.  Original Report Authenticated By: Charline Bills, M.D.   Dg Hand Complete Right  08/29/2011  *RADIOLOGY REPORT*  Clinical Data: Fall, pain in distal second and fourth digit  RIGHT HAND - COMPLETE 3+ VIEW  Comparison: None.  Findings: Nondisplaced fracture at the radial base of the fourth distal  phalanx, extending to the articular surface.  No additional fracture is seen.  The joint spaces are preserved.  The visualized soft tissues are unremarkable.  IMPRESSION: Nondisplaced fracture of the fourth distal phalanx, as described above.  Original Report Authenticated By: Charline Bills, M.D.    1. Laceration of upper arm, right, without mention of complication   2. Fracture of fourth finger, distal phalanx, right, closed   3. Low back strain     MDM  1.   Arm laceration:  Wound appears infected but it is not rapidly progressing.  Treatment with topical triple antibiotic ointment should be sufficient.  If this fails to improve or continues to worsen, patient will return to the ED. 2.   Finger laceration:  Does not appear infected on exam and is healing well. 3.   Finger pain:  Concerning for fracture of distal phalanx.  Radiographs demonstrate non-displaced fx of 4th distal phalanx.  D/C with finger splint for 3 to 4 week. 4.   Back pain:  Exam concerning for bony injury including spondylolysis and spondylolisthesis.  Radiographs negative.  D/C with ibuprofen 800mg  for 1 week.  Lollie Sails, MD 08/29/11 1045

## 2011-08-29 NOTE — ED Provider Notes (Signed)
I saw and evaluated the patient, reviewed the resident's note and I agree with the findings and plan. Patient with a mechanical fall who is complaining of hand pain and back pain. Patient denied any head injury or LOC. He was cut by a rusty nail on his upper arm has been cleaning and applying Neosporin.   Tetanus is up-to-date and patient's plain films were positive only for a distal phalanx fracture. Patient was placed in a splint and discharged  [name], MD 08/29/11 1304

## 2011-08-29 NOTE — ED Notes (Signed)
EDP and resident at the bedside using interpreter phones to obtain assessment.

## 2011-08-29 NOTE — Progress Notes (Signed)
Orthopedic Tech Progress Note Patient Details:  Gabriel Ball [birth year] [id number]  Ortho Devices Type of Ortho Device: Finger splint Ortho Device/Splint Interventions: Application   Cammer, Mickie Bail 08/29/2011, 10:37 AM

## 2012-06-23 ENCOUNTER — Emergency Department (HOSPITAL_COMMUNITY)
Admission: EM | Admit: 2012-06-23 | Discharge: 2012-06-23 | Disposition: A | Discharge status: Home / Self Care | Payer: Self-pay | Attending: Emergency Medicine | Admitting: Emergency Medicine

## 2012-06-23 ENCOUNTER — Encounter (HOSPITAL_COMMUNITY): Payer: Self-pay | Admitting: *Deleted

## 2012-06-23 DIAGNOSIS — F17200 Nicotine dependence, unspecified, uncomplicated: Secondary | ICD-10-CM | POA: Insufficient documentation

## 2012-06-23 DIAGNOSIS — B009 Herpesviral infection, unspecified: Secondary | ICD-10-CM | POA: Insufficient documentation

## 2012-06-23 DIAGNOSIS — J029 Acute pharyngitis, unspecified: Secondary | ICD-10-CM | POA: Insufficient documentation

## 2012-06-23 DIAGNOSIS — R1310 Dysphagia, unspecified: Secondary | ICD-10-CM | POA: Insufficient documentation

## 2012-06-23 DIAGNOSIS — B002 Herpesviral gingivostomatitis and pharyngotonsillitis: Secondary | ICD-10-CM

## 2012-06-23 MED ORDER — LIDOCAINE VISCOUS 2 % MT SOLN
15.0000 mL | Freq: Once | OROMUCOSAL | Status: AC
Start: 2012-06-23 — End: 2012-06-23
  Administered 2012-06-23: 15 mL via OROMUCOSAL
  Filled 2012-06-23: qty 15

## 2012-06-23 MED ORDER — ACYCLOVIR 400 MG PO TABS: 400.0000 mg | ORAL_TABLET | Freq: Four times a day (QID) | ORAL | Status: DC

## 2012-06-23 MED ORDER — ACYCLOVIR 200 MG PO CAPS
400.0000 mg | ORAL_CAPSULE | Freq: Once | ORAL | Status: AC
  Administered 2012-06-23: 400 mg via ORAL
  Filled 2012-06-23 (×2): qty 2

## 2012-06-23 MED ORDER — LIDOCAINE VISCOUS 2 % MT SOLN: 15.0000 mL | OROMUCOSAL | Status: DC | PRN

## 2012-06-23 NOTE — ED Notes (Signed)
The pt thinks he has herpes in his mouth.  Pain for 2 days

## 2012-06-23 NOTE — ED Provider Notes (Signed)
History  This chart was scribed for Gabriel Pel, MS, PA-C working with Gabriel Kaplan, MD by Gabriel Ball, ED Scribe. This patient was seen in room TR10C/TR10C and the patient's care was started at 7:55 PM.   CSN: [id number]  Arrival date & time 06/23/12  1951     Chief Complaint  Patient presents with  . Mouth Lesions    The history is provided by the patient. No language interpreter was used.   Level 5 Caveat due to language barrier. Interview done by Gabriel Pel, MS, PA-C.  HPI Comments: Gabriel Ball is a 42 y.o. male with a history of herpes who presents to the Emergency Department complaining of 2 days of constant, severe "sharp" oral pain which the patient believes to be due to a herpes outbreak. There is associated throat pain that is worsened with swallowing. Pt rates his pain at a 10/10. Pt states that he has lesions under his upper lip. Pt states that he does not have lesions on his tongue. Pt states that has tried Acyclovir which he was prescribed in 2013, with some relief, but he is now out of medication. Pt denies ear pain, fever, nausea, diarrhea, ras or any other symptoms.     Past Medical History  Diagnosis Date  . Herpes     History reviewed. No pertinent past surgical history.  No family history on file.  History  Substance Use Topics  . Smoking status: Current Some Day Smoker  . Smokeless tobacco: Not on file  . Alcohol Use: Yes     Comment: rare      Review of Systems  Constitutional: Negative for fever and chills.  HENT: Positive for sore throat, mouth sores and trouble swallowing. Negative for ear pain.   Gastrointestinal: Negative for nausea, vomiting and diarrhea.  Skin: Negative for rash.   A complete 10 system review of systems was obtained and all systems are negative except as noted in the HPI and PMH.   Allergies  Review of patient's allergies indicates no known allergies.  Home Medications   Current Outpatient Rx  Name  Route   Sig  Dispense  Refill  . acyclovir (ZOVIRAX) 400 MG tablet   Oral   Take 1 tablet (400 mg total) by mouth 4 (four) times daily.   50 tablet   0   . lidocaine (XYLOCAINE) 2 % solution   Oral   Take 15 mLs by mouth as needed for pain.   100 mL   0     Triage Vitals: BP 112/63  Pulse 88  Temp(Src) 99.8 F (37.7 C)  Resp 18  SpO2 99%  Physical Exam  Nursing note and vitals reviewed. Constitutional: He is oriented to person, place, and time. He appears well-developed and well-nourished.  HENT:  Head: Normocephalic.  Eyes: EOM are normal.  Neck: Normal range of motion.  Pulmonary/Chest: Effort normal.  Abdominal: He exhibits no distension.  Musculoskeletal: Normal range of motion.  Neurological: He is alert and oriented to person, place, and time.  Psychiatric: He has a normal mood and affect.    ED Course  Procedures (including critical care time)  DIAGNOSTIC STUDIES: Oxygen Saturation is 99% on RA, normal by my interpretation.    COORDINATION OF CARE: 8:03 PM- Pt advised of plan for treatment and pt agrees.  acyclovir (ZOVIRAX) 400 MG tablet Take 1 tablet (400 mg total) by mouth 4 (four) times daily. 50 tablet Dorthula Matas, PA-C lidocaine (XYLOCAINE) 2 % solution Take 15  mLs by mouth as needed for pain. 100 mL Dorthula Matas, PA-C    Labs Reviewed - No data to display No results found.   1. Oral herpes       MDM   41 y.o.Gabriel Ball's evaluation in the Emergency Department is complete. It has been determined that no acute conditions requiring further emergency intervention are present at this time. The patient/guardian have been advised of the diagnosis and plan. We have discussed signs and symptoms that warrant return to the ED, such as changes or worsening in symptoms.  Vital signs are stable at discharge. Filed Vitals:   06/23/12 1956  BP: 112/63  Pulse: 88  Temp: 99.8 F (37.7 C)  Resp: 18    Patient/guardian has voiced understanding and  agreed to follow-up with the PCP or specialist.  I personally performed the services described in this documentation, which was scribed in my presence. The recorded information has been reviewed and is accurate.    Dorthula Matas, PA-C 06/23/12 2346

## 2012-06-28 NOTE — ED Provider Notes (Signed)
Medical screening examination/treatment/procedure(s) were performed by non-physician practitioner and as supervising physician I was immediately available for consultation/collaboration.  Portia Wisdom, MD 06/28/12 1629 

## 2012-07-08 ENCOUNTER — Encounter (HOSPITAL_COMMUNITY): Payer: Self-pay | Admitting: *Deleted

## 2012-07-08 ENCOUNTER — Emergency Department (HOSPITAL_COMMUNITY)
Admission: EM | Admit: 2012-07-08 | Discharge: 2012-07-08 | Disposition: A | Discharge status: Home / Self Care | Payer: Self-pay | Attending: Emergency Medicine | Admitting: Emergency Medicine

## 2012-07-08 DIAGNOSIS — R21 Rash and other nonspecific skin eruption: Secondary | ICD-10-CM | POA: Insufficient documentation

## 2012-07-08 DIAGNOSIS — Z8619 Personal history of other infectious and parasitic diseases: Secondary | ICD-10-CM | POA: Insufficient documentation

## 2012-07-08 DIAGNOSIS — B356 Tinea cruris: Secondary | ICD-10-CM | POA: Insufficient documentation

## 2012-07-08 DIAGNOSIS — F17200 Nicotine dependence, unspecified, uncomplicated: Secondary | ICD-10-CM | POA: Insufficient documentation

## 2012-07-08 MED ORDER — FLUOCINONIDE EMULSIFIED BASE 0.05 % EX CREA: 1.0000 "application " | TOPICAL_CREAM | Freq: Two times a day (BID) | CUTANEOUS | Status: DC

## 2012-07-08 MED ORDER — TERBINAFINE HCL 1 % EX CREA: TOPICAL_CREAM | Freq: Two times a day (BID) | CUTANEOUS | Status: DC

## 2012-07-08 NOTE — ED Provider Notes (Signed)
History     CSN: [id number]  Arrival date & time 07/08/12  1315   First MD Initiated Contact with Patient 07/08/12 1334      Chief Complaint  Patient presents with  . Groin Pain    (Consider location/radiation/quality/duration/timing/severity/associated sxs/prior treatment) [name] is a 42 y/o Male with a PMH of herpes who presents to the ED wit cc of itching crotch.  Patient states for approximately 5 days.  He states that he sweats profusely since it is so hot out.  The patient has been applying Neosporin and Desitin without relief of his itching.  He denies any discharge from his penis, urinary symptoms, groin swelling or testicle pain.  He denies any recent unprotected sexual intercourse, Denies blisters or sxs of herpetic outbreak.  Past Medical History  Diagnosis Date  . Herpes     History reviewed. No pertinent past surgical history.  History reviewed. No pertinent family history.  History  Substance Use Topics  . Smoking status: Current Some Day Smoker  . Smokeless tobacco: Not on file  . Alcohol Use: Yes     Comment: rare      Review of Systems  Constitutional: Negative for fever and chills.  Respiratory: Negative for cough and shortness of breath.   Cardiovascular: Negative for chest pain and palpitations.  Gastrointestinal: Negative for vomiting, abdominal pain, diarrhea and constipation.  Genitourinary: Negative for dysuria, urgency, frequency, hematuria, flank pain, discharge, penile swelling, scrotal swelling, difficulty urinating, genital sores, penile pain and testicular pain.  Musculoskeletal: Negative for myalgias and arthralgias.  Skin: Positive for rash.  Neurological: Negative for headaches.    Allergies  Review of patient's allergies indicates no known allergies.  Home Medications  No current outpatient prescriptions on file.  BP 108/59  Pulse 80  Temp(Src) 98.3 F (36.8 C) (Oral)  Resp 16  SpO2 97%  Physical Exam  Nursing  note and vitals reviewed. Constitutional: He appears well-developed and well-nourished. No distress.  HENT:  Head: Normocephalic and atraumatic.  Eyes: Conjunctivae are normal. No scleral icterus.  Neck: Normal range of motion. Neck supple.  Cardiovascular: Normal rate, regular rhythm and normal heart sounds.   Pulmonary/Chest: Effort normal and breath sounds normal. No respiratory distress.  Abdominal: Soft. There is no tenderness.  Genitourinary: Testes normal and penis normal. Right testis shows no mass, no swelling and no tenderness. Left testis shows no mass, no swelling and no tenderness. Circumcised.     Musculoskeletal: He exhibits no edema.  Neurological: He is alert.  Skin: Skin is warm and dry. He is not diaphoretic.  Psychiatric: His behavior is normal.    ED Course  Procedures (including critical care time)  Labs Reviewed - No data to display No results found.   1. Tinea cruris       MDM  Patient with tinea cruris. The patient may have a component of neomycin allergy as well. Will d/c with lidex and Lamisil.  Discussed signs/sxs of infection. And resasons to seek immediate care.        Arthor Captain, PA-C 07/08/12 1651

## 2012-07-08 NOTE — ED Notes (Signed)
Pt reports having scratch to groin area x 5 days and thinks its irritated from the heat, having itching and no relief with otc creams.

## 2012-07-10 NOTE — ED Provider Notes (Signed)
Medical screening examination/treatment/procedure(s) were performed by non-physician practitioner and as supervising physician I was immediately available for consultation/collaboration.   Laray Anger, DO 07/10/12 1755

## 2012-09-15 ENCOUNTER — Emergency Department (HOSPITAL_COMMUNITY)
Admission: EM | Admit: 2012-09-15 | Discharge: 2012-09-15 | Disposition: A | Discharge status: Home / Self Care | Payer: Self-pay | Attending: Emergency Medicine | Admitting: Emergency Medicine

## 2012-09-15 ENCOUNTER — Encounter (HOSPITAL_COMMUNITY): Payer: Self-pay | Admitting: Emergency Medicine

## 2012-09-15 DIAGNOSIS — F17200 Nicotine dependence, unspecified, uncomplicated: Secondary | ICD-10-CM | POA: Insufficient documentation

## 2012-09-15 DIAGNOSIS — K089 Disorder of teeth and supporting structures, unspecified: Secondary | ICD-10-CM | POA: Insufficient documentation

## 2012-09-15 DIAGNOSIS — K0889 Other specified disorders of teeth and supporting structures: Secondary | ICD-10-CM

## 2012-09-15 DIAGNOSIS — Z8619 Personal history of other infectious and parasitic diseases: Secondary | ICD-10-CM | POA: Insufficient documentation

## 2012-09-15 MED ORDER — HYDROCODONE-ACETAMINOPHEN 5-325 MG PO TABS: 1.0000 | ORAL_TABLET | Freq: Four times a day (QID) | ORAL | Status: DC | PRN

## 2012-09-15 MED ORDER — PENICILLIN V POTASSIUM 500 MG PO TABS: 500.0000 mg | ORAL_TABLET | Freq: Four times a day (QID) | ORAL | Status: DC

## 2012-09-15 NOTE — ED Notes (Signed)
Patient states he is having dental pain in L upper molar area.   Patient states that he has been running a fever, but has not checked his temperature.   Patient states he went to the dentist 6 months ago and they advised no dental problems.

## 2012-09-15 NOTE — ED Provider Notes (Signed)
CSN: [id number]     Arrival date & time 09/15/12  0840 History   First MD Initiated Contact with Patient 09/15/12 [phone]     Chief Complaint  Patient presents with  . Dental Pain   (Consider location/radiation/quality/duration/timing/severity/associated sxs/prior Treatment) HPI Comments: Patient is a 42 year old male with no significant past medical history who presents for dental pain x10 days. Patient states the pain is been worsening since onset. The pain is constant, but waxing and waning in severity, and nonradiating. Patient has been trying ibuprofen and viscous lidocaine for the pain with mild relief. Patient states pain is worse when chewing. Patient denies associated fever, oral bleeding, difficulty swallowing, drooling, or shortness of breath.  Patient is a 42 y.o. male presenting with tooth pain. The history is provided by the patient. No language interpreter was used.  Dental Pain Associated symptoms: no drooling, no fever and no neck pain     Past Medical History  Diagnosis Date  . Herpes    History reviewed. No pertinent past surgical history. No family history on file. History  Substance Use Topics  . Smoking status: Current Some Day Smoker  . Smokeless tobacco: Not on file  . Alcohol Use: Yes     Comment: rare    Review of Systems  Constitutional: Negative for fever.  HENT: Positive for dental problem. Negative for drooling, trouble swallowing, neck pain and neck stiffness.   Respiratory: Negative for shortness of breath.   All other systems reviewed and are negative.    Allergies  Review of patient's allergies indicates no known allergies.  Home Medications   Current Outpatient Rx  Name  Route  Sig  Dispense  Refill  . HYDROcodone-acetaminophen (NORCO/VICODIN) 5-325 MG per tablet   Oral   Take 1 tablet by mouth every 6 (six) hours as needed for pain.   7 tablet   0   . penicillin v potassium (VEETID) 500 MG tablet   Oral   Take 1 tablet (500 mg  total) by mouth 4 (four) times daily.   40 tablet   0    BP 106/71  Pulse 70  Temp(Src) 97.4 F (36.3 C) (Oral)  Resp 20  Ht 5\' 7"  (1.702 m)  Wt 190 lb (86.183 kg)  BMI 29.75 kg/m2  SpO2 97%  Physical Exam  Nursing note and vitals reviewed. Constitutional: He is oriented to person, place, and time. He appears well-developed and well-nourished. No distress.  HENT:  Head: Normocephalic and atraumatic.  Right Ear: Tympanic membrane, external ear and ear canal normal. No mastoid tenderness.  Left Ear: Tympanic membrane, external ear and ear canal normal. No mastoid tenderness.  Nose: Nose normal.  Mouth/Throat: Uvula is midline, oropharynx is clear and moist and mucous membranes are normal. No oral lesions. No trismus in the jaw. Abnormal dentition. Dental caries present. No dental abscesses. No oropharyngeal exudate, posterior oropharyngeal edema or posterior oropharyngeal erythema.    Airway patent. No trismus or stridor. No area of fluctuance to suspect drainable dental abscess. Uvula midline.  Eyes: Conjunctivae and EOM are normal. Pupils are equal, round, and reactive to light. No scleral icterus.  Neck: Normal range of motion. Neck supple.  Cardiovascular: Normal rate, regular rhythm, normal heart sounds and intact distal pulses.   Pulmonary/Chest: Effort normal and breath sounds normal. No stridor. No respiratory distress. He has no wheezes. He has no rales.  Musculoskeletal: Normal range of motion.  Neurological: He is alert and oriented to person, place, and time.  Skin:  Skin is warm and dry. No rash noted. He is not diaphoretic. No erythema. No pallor.  Psychiatric: He has a normal mood and affect. His behavior is normal.    ED Course  Procedures (including critical care time) Labs Review Labs Reviewed - No data to display Imaging Review No results found.  MDM   1. Pain, dental    Uncomplicated dental pain. Patient well and nontoxic appearing, hemodynamically  stable, and afebrile. Physical exam findings as above. There is no trismus or stridor. Uvula midline without evidence of peritonsillar abscess. No area of fluctuance to suspect drainable dental abscess. Airway patent and patient tolerating secretions without difficulty. No red flags or signs concerning for Ludwig's angina. Patient appropriate for discharge with instruction for dental followup. Prescription for penicillin given as well as short course of Norco to take as needed for pain. Return precautions advised and patient agreeable to plan.     Antony Madura, PA-C 09/15/12 0930

## 2012-09-15 NOTE — ED Provider Notes (Signed)
Medical screening examination/treatment/procedure(s) were performed by non-physician practitioner and as supervising physician I was immediately available for consultation/collaboration.   Shanna Cisco, MD 09/15/12 [phone]

## 2012-09-28 ENCOUNTER — Ambulatory Visit: Payer: Self-pay | Attending: Internal Medicine

## 2012-10-19 ENCOUNTER — Ambulatory Visit: Payer: Self-pay | Admitting: Family Medicine

## 2012-10-31 ENCOUNTER — Encounter (HOSPITAL_COMMUNITY): Payer: Self-pay | Admitting: Emergency Medicine

## 2012-10-31 ENCOUNTER — Emergency Department (HOSPITAL_COMMUNITY)
Admission: EM | Admit: 2012-10-31 | Discharge: 2012-10-31 | Disposition: A | Discharge status: Home / Self Care | Payer: No Typology Code available for payment source | Attending: Emergency Medicine | Admitting: Emergency Medicine

## 2012-10-31 DIAGNOSIS — K148 Other diseases of tongue: Secondary | ICD-10-CM

## 2012-10-31 DIAGNOSIS — Z8619 Personal history of other infectious and parasitic diseases: Secondary | ICD-10-CM | POA: Insufficient documentation

## 2012-10-31 DIAGNOSIS — F17200 Nicotine dependence, unspecified, uncomplicated: Secondary | ICD-10-CM | POA: Insufficient documentation

## 2012-10-31 MED ORDER — ACYCLOVIR 400 MG PO TABS: 400.0000 mg | ORAL_TABLET | Freq: Four times a day (QID) | ORAL | Status: DC

## 2012-10-31 MED ORDER — LIDOCAINE VISCOUS 2 % MT SOLN: 15.0000 mL | OROMUCOSAL | Status: DC | PRN

## 2012-10-31 NOTE — ED Notes (Signed)
Pt discharged.Vital signs stable and GCS 15.Discharge instruction given. 

## 2012-10-31 NOTE — ED Provider Notes (Signed)
CSN: [id number]     Arrival date & time 10/31/12  2048 History  This chart was scribed for Gabriel Morn, NP working with Enid Skeens, MD by Carl Best, ED Scribe. This patient was seen in room TR08C/TR08C and the patient's care was started at 10:51 PM.     Chief Complaint  Patient presents with  . Oral Swelling    The history is provided by the patient. The history is limited by a language barrier. No language interpreter was used.   HPI Comments: Virgil Lightner is a 42 y.o. male who presents to the Emergency Department complaining of left lateral tongue pain and swelling that has persisted for the past three days.  He denies fever, cough, sore throat, and difficulty swallowing as associated symptoms.  The patient states that has a history of tongue and mouth sores for which he has been prescribed acyclovir.  He is requesting refill of acyclovir today. Past Medical History  Diagnosis Date  . Herpes    History reviewed. No pertinent past surgical history. No family history on file. History  Substance Use Topics  . Smoking status: Current Some Day Smoker  . Smokeless tobacco: Not on file  . Alcohol Use: Yes     Comment: rare    Review of Systems  Constitutional: Negative for fever.  HENT: Negative for sore throat and trouble swallowing.   Respiratory: Negative for cough.   All other systems reviewed and are negative.    Allergies  Review of patient's allergies indicates no known allergies.  Home Medications   Current Outpatient Rx  Name  Route  Sig  Dispense  Refill  . acyclovir (ZOVIRAX) 400 MG tablet   Oral   Take 400 mg by mouth 4 (four) times daily.          Triage Vitals: Pulse 87  Temp(Src) 98.2 F (36.8 C) (Oral)  Resp 14  Wt 198 lb (89.812 kg)  BMI 31 kg/m2  SpO2 98%  Physical Exam  Nursing note and vitals reviewed. Constitutional: He is oriented to person, place, and time. He appears well-developed and well-nourished. No distress.  HENT:  Head:  Normocephalic and atraumatic.  Painful lesion to tongue bilaterally.  Eyes: EOM are normal.  Neck: Neck supple. No tracheal deviation present.  Cardiovascular: Normal rate.   Pulmonary/Chest: Effort normal. No respiratory distress.  Musculoskeletal: Normal range of motion.  Neurological: He is alert and oriented to person, place, and time.  Skin: Skin is warm and dry.  Psychiatric: He has a normal mood and affect. His behavior is normal.  Tongue lesions appear herpetic.  ED Course  Procedures (including critical care time)  DIAGNOSTIC STUDIES: Oxygen Saturation is 98% on room air, normal by my interpretation.    COORDINATION OF CARE: 10:54 PM- Researched the medication the patient took pta.    Labs Review Labs Reviewed - No data to display Imaging Review No results found.  EKG Interpretation   None       MDM   1. Tongue lesion    I personally performed the services described in this documentation, which was scribed in my presence. The recorded information has been reviewed and is accurate.    Jimmye Norman, NP 11/01/12 [phone]

## 2012-10-31 NOTE — ED Notes (Signed)
Pt. reports left lateral tongue pain / swelling for 3 days , denies injury , pt. stated he has taken prescription Acyclovir with no relief , friend translating ( Spanish) for pt. Denies fever .

## 2012-11-01 NOTE — ED Provider Notes (Signed)
Medical screening examination/treatment/procedure(s) were performed by non-physician practitioner and as supervising physician I was immediately available for consultation/collaboration.   Enid Skeens, MD 11/01/12 [phone]

## 2013-01-22 ENCOUNTER — Emergency Department (HOSPITAL_COMMUNITY)
Admission: EM | Admit: 2013-01-22 | Discharge: 2013-01-22 | Discharge status: Against Medical Advice | Payer: No Typology Code available for payment source | Attending: Emergency Medicine | Admitting: Emergency Medicine

## 2013-01-22 ENCOUNTER — Encounter (HOSPITAL_COMMUNITY): Payer: Self-pay | Admitting: Emergency Medicine

## 2013-01-22 DIAGNOSIS — F17200 Nicotine dependence, unspecified, uncomplicated: Secondary | ICD-10-CM | POA: Insufficient documentation

## 2013-01-22 DIAGNOSIS — R51 Headache: Secondary | ICD-10-CM | POA: Insufficient documentation

## 2013-01-22 NOTE — ED Notes (Signed)
Pt c/o right sided head and face pain x 3 days

## 2013-01-22 NOTE — ED Notes (Signed)
Pt states he can not wait any longer he must go to work. Encouraged pt to stay for medical screening but he states he is going to come back later

## 2013-01-23 ENCOUNTER — Emergency Department (HOSPITAL_COMMUNITY)
Admission: EM | Admit: 2013-01-23 | Discharge: 2013-01-23 | Disposition: A | Discharge status: Home / Self Care | Payer: No Typology Code available for payment source | Source: Home / Self Care

## 2013-01-23 ENCOUNTER — Encounter (HOSPITAL_COMMUNITY): Payer: Self-pay | Admitting: Emergency Medicine

## 2013-01-23 DIAGNOSIS — H109 Unspecified conjunctivitis: Secondary | ICD-10-CM

## 2013-01-23 DIAGNOSIS — L538 Other specified erythematous conditions: Secondary | ICD-10-CM

## 2013-01-23 DIAGNOSIS — H938X9 Other specified disorders of ear, unspecified ear: Secondary | ICD-10-CM

## 2013-01-23 DIAGNOSIS — G519 Disorder of facial nerve, unspecified: Secondary | ICD-10-CM

## 2013-01-23 DIAGNOSIS — G510 Bell's palsy: Secondary | ICD-10-CM

## 2013-01-23 MED ORDER — METHYLPREDNISOLONE 4 MG PO KIT: PACK | ORAL | Status: DC

## 2013-01-23 MED ORDER — GABAPENTIN 300 MG PO CAPS: ORAL_CAPSULE | ORAL | Status: DC

## 2013-01-23 MED ORDER — CEPHALEXIN 500 MG PO CAPS: 500.0000 mg | ORAL_CAPSULE | Freq: Four times a day (QID) | ORAL | Status: DC

## 2013-01-23 MED ORDER — POLYMYXIN B-TRIMETHOPRIM 10000-0.1 UNIT/ML-% OP SOLN: 1.0000 [drp] | OPHTHALMIC | Status: DC

## 2013-01-23 MED ORDER — TETRACAINE HCL 0.5 % OP SOLN
OPHTHALMIC | Status: AC
  Filled 2013-01-23: qty 2

## 2013-01-23 NOTE — ED Notes (Signed)
Pt c/o right side face/head pain x4 days... [facility] due to long wait Hx of left side stroke about 10 yrs ago Also c/o right side eye irritation Denies: weakness, abn speech... Symmetrical facial w/NAD Alert w/no signs of acute distress.

## 2013-01-23 NOTE — Discharge Instructions (Signed)
Conjuntivitis (Conjunctivitis) Usted padece conjuntivitis. La conjuntivitis se conoce frecuentemente como "ojo rojo". Las causas de la conjuntivitis pueden ser las infecciones virales o Pepeekeobacterianas, Environmental consultantalergias o lesiones. Los sntomas son: enrojecimiento de la superficie del ojo, picazn, molestias y en algunos casos, secreciones. La secrecin se deposita en las pestaas. Las infecciones virales causan una secrecin acuosa, mientras que las infecciones bacterianas causan una secrecin amarillenta y espesa. La conjuntivitis es muy contagiosa y se disemina por el contacto directo. Devon EnergyComo parte del tratamiento le indicaran gotas oftlmicas con antibiticos. Antes de Apache Corporationutilizar el medicamento, retire todas la secreciones del ojo, lavndolo suavemente con agua tibia y algodn. Contine con el uso del medicamento hasta que se haya Entergy Corporationdespertado dos maanas sin secrecin ocular. No se frote los ojos. Esto hace que aumente la irritacin y favorece la extensin de la infeccin. No utilice las Lear Corporationmismas toallas que los miembros de Floridasu familia. Lvese las manos con agua y Bel[place]jabn antes y despus de tocarse los ojos. Utilice compresas fras para reducir Chief Technology Officerel dolor y anteojos de sol para disminuir la irritacin que ocasiona la luz. No debe usarse maquillaje ni lentes de contacto hasta que la infeccin haya desaparecido. SOLICITE ATENCIN MDICA SI:  Sus sntomas no mejoran luego de [address]  [address]das de Randalltratamiento.  Aumenta el dolor o las dificultades para ver.  La zona externa de los prpados est muy roja o hinchada. Document Released: 01/04/2005 Document Revised: 03/29/2011 [facility] Patient Information 2014 [place], [place].  Parestesia  (Paresthesia) La parestesia remite a un ardor anormal o sensacin de picor. Esta sensacin por lo general afecta a brazos, manos, piernas o pies. Puede ocurrir en cualquier parte del cuerpo. Esta enfermedad no es dolorosa. Tambin puede describirse como:  Hormigueo o  adormecimiento.  Pinchazos.  Piel de gallina.  Sensibilidad.  Miembros que se "duermen".  Picazn. La mayora de las personas ha experimentado parestesia temporaria (transitoria) en algn momento de sus vidas. CAUSAS La parestesia puede producirse al respirar muy rpidamente (hiperventilacin). Tambin puede ocurrir sin causa aparente. Comnmente se produce cuando se hace presin sobre un nervio. La sensacin desaparece rpidamente una vez que se elimina la presin. Sin embargo, en Time Warneralgunas personas la parestesia es una enfermedad de larga duracin (crnica) y la causa un trastorno subyacente. El trastorno subyacente puede ser:   Neomia DearUna lesin traumtica, directa, en los nervios. Algunos ejemplos son:  Clois ComberQuebradura Customer service manager(fractura) del cuello.  Crneo fracturado.  Una enfermedad que afecte el cerebro y la mdula espinal (sistema nervioso central). Por ejemplo:  Mielitis transversa.  Encefalitis.  Ataque isqumico transitorio.  Esclerosis mltiple.  Ictus.  Un problema de tumor o vaso sanguneo como una malformacin arteriovenosa que presiona contra el cerebro o la mdula espinal.  Un amplio rango de enfermedades tambin pueden daar a los nervios perifricos (neuropata perifrica). Los nervios perifricos son nervios que no son parte del cerebro y la mdula espinal. Estas enfermedades incluyen:  Diabetes.  Enfermedad vascular perifrica.  Sndrome de atrapamiento de nervios, como el sndrome de tnel carpiano.  Culebrilla.  Hipotiroidismo.  Dficit de vitamina B12.  Alcoholismo.  Envenenamiento con metales pesados (plomo, arsnico).  Artritis reumatoidea.  Lupus sistmico eritematoso. DIAGNSTICO El mdico tratar de encontrar la causa subyacente de la parestesia. El medico podr:   Education officer, environmentalealizar una historia clnica.  Hacer un examen fsico.  Indicar varios anlisis de laboratorio.  Solicitar diagnsticos por imgenes. TRATAMIENTO El tratamiento para la parestesia  depende de la causa subyacente.  INSTRUCCIONES PARA EL CUIDADO EN EL HOGAR   Evite consumir alcohol.  Considere los masajes o la acupuntura para [employer] sntomas.  Cumpla con todas las visitas de control, segn le indique su mdico. SOLICITE ATENCIN MDICA DE INMEDIATO SI:   Se siente dbil.  Tiene dificultad para caminar o moverse.  Tiene problemas para hablar o visuales.  Se siente confundido.  No puede controlar la vejiga o los movimientos intestinales.  Siente adormecimiento despus de un traumatismo.  Se desmaya.  La sensacin de ardor o pinchazos empeoran al caminar.  Siente dolor, calambres o mareos.  Aparece una erupcin cutnea. ASEGRESE DE QUE:   Comprende estas instrucciones.  Controlar su enfermedad.  Solicitar ayuda de inmediato si no mejora o si empeora. Document Released: 04/22/2008 Document Revised: 03/29/2011 [facility] Patient Information 2014 [place], [place].

## 2013-01-23 NOTE — ED Provider Notes (Signed)
CSN: [id number]     Arrival date & time 01/23/13  1424 History   First MD Initiated Contact with Patient 01/23/13 1508     Chief Complaint  Patient presents with  . Facial Pain   (Consider location/radiation/quality/duration/timing/severity/associated sxs/prior Treatment) HPI Comments: 43 year old Hispanic male is not speaking which (Ramon interpreter) presents with a four-day history of discomfort to the right side of his face and parietal scalp. There was a gradual onset of an abnormal feeling like a tingling sensation or bugs crawling on the right side of his scalp. He has noticed that there is erythema and mild swelling to the right earlobe as well as a change in sensation to the portion of the right side of the face and inability to completely close his right eye. He states his right eye feels dry and is often tearing. He feels as though there may be a foreign body in that eye. Approximately 10 years ago he had a similar episode except for the fact that he had no ear pain. He was told at that time as well as a neurological problem and he does not recall the treatment however it did get better. Denies headache or pain.   Past Medical History  Diagnosis Date  . Herpes    History reviewed. No pertinent past surgical history. No family history on file. History  Substance Use Topics  . Smoking status: Current Some Day Smoker  . Smokeless tobacco: Not on file  . Alcohol Use: Yes     Comment: rare    Review of Systems  Constitutional: Negative.  Negative for fever and fatigue.  HENT: Positive for ear pain. Negative for congestion, dental problem, drooling, ear discharge, facial swelling, hearing loss, mouth sores, postnasal drip, rhinorrhea, sinus pressure, sneezing, sore throat and tinnitus.   Eyes: Positive for discharge and redness.  Respiratory: Negative.   Cardiovascular: Negative.   Gastrointestinal: Negative.   Genitourinary: Negative.   Musculoskeletal: Negative for  arthralgias, gait problem, joint swelling and neck pain.  Neurological: Positive for light-headedness and numbness. Negative for dizziness, tremors, seizures, syncope, facial asymmetry, speech difficulty, weakness and headaches.  Psychiatric/Behavioral: Negative.     Allergies  Review of patient's allergies indicates no known allergies.  Home Medications   Current Outpatient Rx  Name  Route  Sig  Dispense  Refill  . acyclovir (ZOVIRAX) 400 MG tablet   Oral   Take 1 tablet (400 mg total) by mouth 4 (four) times daily.   50 tablet   0   . cephALEXin (KEFLEX) 500 MG capsule   Oral   Take 1 capsule (500 mg total) by mouth 4 (four) times daily.   28 capsule   0   . gabapentin (NEURONTIN) 300 MG capsule      1 tab po at bedtime 1st day, 1 tablet bid second day, then 1 tablet tid   20 capsule   0   . ibuprofen (ADVIL,MOTRIN) 200 MG tablet   Oral   Take 400 mg by mouth every 6 (six) hours as needed for moderate pain.         . methylPREDNISolone (MEDROL DOSEPAK) 4 MG tablet      follow package directions   21 tablet   0   . trimethoprim-polymyxin b (POLYTRIM) ophthalmic solution   Right Eye   Place 1 drop into the right eye every 4 (four) hours.   10 mL   0    BP 107/64  Pulse 77  Temp(Src) 98.5 F (36.9  C) (Oral)  Resp 16  SpO2 99% Physical Exam  Nursing note and vitals reviewed. Constitutional: He is oriented to person, place, and time. He appears well-developed and well-nourished. No distress.  HENT:  Head: Normocephalic and atraumatic.  Mouth/Throat: No oropharyngeal exudate.  The right earlobe has mild swelling and deep erythema. Minimal tenderness. TM partially visible but mostly it is cured by cerumen. Left outer ear and TM within normal limits.  Oropharynx with no erythema or other abnormalities. Soft palate rises symmetrically.  Eyes: EOM are normal. Pupils are equal, round, and reactive to light. No scleral icterus.  Mild erythema to the right  conjunctiva. Some tearing  Neck: Normal range of motion. Neck supple.  Cardiovascular: Normal rate, regular rhythm, normal heart sounds and intact distal pulses.   Pulmonary/Chest: Effort normal and breath sounds normal. No respiratory distress. He has no wheezes. He has no rales.  Musculoskeletal: Normal range of motion. He exhibits no edema and no tenderness.  Lymphadenopathy:    He has no cervical adenopathy.  Neurological: He is alert and oriented to person, place, and time. He has normal strength. He displays no tremor. A cranial nerve deficit and sensory deficit is present. He exhibits normal muscle tone. Coordination and gait normal.  The patient states that light touching of the scalp and face his perceived as normal. Denies tactile sensation changes. There is symmetry in the face with muscular movement. Able to close both eyes strongly with the ability to keep the examiner from forcibly opening the eyelids.  Skin: Skin is warm and dry. No rash noted.  Psychiatric: He has a normal mood and affect.    ED Course  Procedures (including critical care time) Labs Review Labs Reviewed - No data to display Imaging Review No results found.    MDM   1. Facial neuropathy   2. Conjunctivitis, right eye   3. 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 Cranial nerve 7  appears intact without asymmetry. No other abnormal neurologic signs to include the extremities or torso.  Polytrim eyedrops as directed Cephalexin 500 mg 4 times a day Neurontin as directed #20 tabs Medrol Dosepak Followup with adult [facility] clinic. For worsening new symptoms or problems go to the emergency department. The patient is discharged in a stable condition. For any worsening new symptoms problems to the emergency department.   Hayden Rasmussen, NP 01/23/13 1626

## 2013-01-24 NOTE — ED Provider Notes (Signed)
Medical screening examination/treatment/procedure(s) were performed by a resident physician or non-physician practitioner and as the supervising physician I was immediately available for consultation/collaboration.  Darvin Dials, MD    Berk Pilot S Mickle Campton, MD 01/24/13 0800 

## 2013-01-31 ENCOUNTER — Ambulatory Visit: Payer: No Typology Code available for payment source

## 2013-02-03 ENCOUNTER — Encounter (HOSPITAL_COMMUNITY): Payer: Self-pay | Admitting: Emergency Medicine

## 2013-02-03 ENCOUNTER — Emergency Department (HOSPITAL_COMMUNITY)
Admission: EM | Admit: 2013-02-03 | Discharge: 2013-02-03 | Disposition: A | Discharge status: Home / Self Care | Payer: No Typology Code available for payment source | Source: Home / Self Care

## 2013-02-03 DIAGNOSIS — B0089 Other herpesviral infection: Secondary | ICD-10-CM

## 2013-02-03 DIAGNOSIS — B009 Herpesviral infection, unspecified: Secondary | ICD-10-CM

## 2013-02-03 MED ORDER — ACYCLOVIR 400 MG PO TABS: 400.0000 mg | ORAL_TABLET | Freq: Four times a day (QID) | ORAL | Status: DC

## 2013-02-03 NOTE — ED Provider Notes (Signed)
Medical screening examination/treatment/procedure(s) were performed by non-physician practitioner and as supervising physician I was immediately available for consultation/collaboration.  Ethridge Sollenberger, M.D.  Hendryx Ricke C Corlene Sabia, MD 02/03/13 2221 

## 2013-02-03 NOTE — ED Notes (Signed)
Needs refill of his herpes Rx for bumps on tongue

## 2013-02-03 NOTE — ED Provider Notes (Signed)
CSN: [id number]     Arrival date & time 02/03/13  1159 History   None    Chief Complaint  Patient presents with  . Medication Refill   (Consider location/radiation/quality/duration/timing/severity/associated sxs/prior Treatment) HPI Comments: C/O recurrent herpetic lesions to the tongue, burning and painful Has been going to the ED and Urgent Care for refills of acyclovir and other Primary care issues   Past Medical History  Diagnosis Date  . Herpes    History reviewed. No pertinent past surgical history. History reviewed. No pertinent family history. History  Substance Use Topics  . Smoking status: Current Some Day Smoker  . Smokeless tobacco: Not on file  . Alcohol Use: Yes     Comment: rare    Review of Systems  All other systems reviewed and are negative.    Allergies  Review of patient's allergies indicates no known allergies.  Home Medications   Current Outpatient Rx  Name  Route  Sig  Dispense  Refill  . acyclovir (ZOVIRAX) 400 MG tablet   Oral   Take 1 tablet (400 mg total) by mouth 4 (four) times daily.   50 tablet   0   . acyclovir (ZOVIRAX) 400 MG tablet   Oral   Take 1 tablet (400 mg total) by mouth 4 (four) times daily.   50 tablet   0   . cephALEXin (KEFLEX) 500 MG capsule   Oral   Take 1 capsule (500 mg total) by mouth 4 (four) times daily.   28 capsule   0   . gabapentin (NEURONTIN) 300 MG capsule      1 tab po at bedtime 1st day, 1 tablet bid second day, then 1 tablet tid   20 capsule   0   . ibuprofen (ADVIL,MOTRIN) 200 MG tablet   Oral   Take 400 mg by mouth every 6 (six) hours as needed for moderate pain.         . methylPREDNISolone (MEDROL DOSEPAK) 4 MG tablet      follow package directions   21 tablet   0   . trimethoprim-polymyxin b (POLYTRIM) ophthalmic solution   Right Eye   Place 1 drop into the right eye every 4 (four) hours.   10 mL   0    BP 108/70  Pulse 74  Temp(Src) 98.3 F (36.8 C) (Oral)  Resp 10   SpO2 98% Physical Exam  Nursing note and vitals reviewed. Constitutional: He is oriented to person, place, and time. He appears well-developed and well-nourished. No distress.  HENT:  Mouth/Throat: No oropharyngeal exudate.  Edge of tongue with multiple tender ulcerations and small vessicles.  Pulmonary/Chest: Effort normal. No respiratory distress.  Neurological: He is alert and oriented to person, place, and time.  Skin: Skin is warm and dry.    ED Course  Procedures (including critical care time) Labs Review Labs Reviewed - No data to display Imaging Review No results found.    MDM   1. Recurrent oral herpes simplex infection      Refill acyclovir 400mg  See your doctor, st has one with an appointment soon.    Hayden Rasmussenavid Kameron Blethen, NP 02/03/13 1404

## 2013-02-03 NOTE — Discharge Instructions (Signed)
Herpes simple (Herpes Simplex) El herpes simple generalmente se clasifica en tipo 1 o tipo 2. Por lo general, el tipo 1 es el responsable de las lceras peribucales. El tipo 2 generalmente se relaciona con las enfermedades de transmisin sexual. Ahora sabemos que mucho de lo que pensbamos sobre estos virus no es exacto. Se ha descubierto que el HSV1 tambin est presente en los genitales y el HSV2 puede presentarse en la boca, aunque esto variar en las diferentes zonas del mundo. El herpes simple usualmente se detecta mediante un cultivo. Los anlisis de sangre tambin estn disponibles para este virus, sin embargo, la precisin a menudo no es tan adecuada.  PREPARACIN PARA EL ESTUDIO No se requiere una preparacin especial ni ayunar. HALLAZGOS NORMALES  No se observa virus.  No se observan anticuerpos o antgenos de HSV. Los rangos para los resultados normales pueden variar entre diferentes laboratorios y hospitales. Consulte siempre con su mdico despus de hacer el estudio para conocer el significado de los resultados y si los valores se consideran "dentro de los lmites normales". SIGNIFICADO DEL ESTUDIO  El mdico leer los resultados y hablar con usted sobre la importancia y el significado de los resultados, as como las opciones de tratamiento y la necesidad de realizar pruebas adicionales, si fuera necesario. OBTENCIN DE LOS RESULTADOS DE LAS PRUEBAS  Es su responsabilidad retirar el resultado del estudio. Consulte en el laboratorio cundo y cmo podr obtener los resultados. Document Released: 10/25/2012 [employer] Patient Information 2014 [employer].  

## 2013-02-09 ENCOUNTER — Ambulatory Visit: Payer: No Typology Code available for payment source

## 2013-05-16 ENCOUNTER — Emergency Department (INDEPENDENT_AMBULATORY_CARE_PROVIDER_SITE_OTHER)
Admission: EM | Admit: 2013-05-16 | Discharge: 2013-05-16 | Disposition: A | Discharge status: Home / Self Care | Payer: Self-pay | Source: Home / Self Care | Attending: Family Medicine | Admitting: Family Medicine

## 2013-05-16 ENCOUNTER — Encounter (HOSPITAL_COMMUNITY): Payer: Self-pay | Admitting: Emergency Medicine

## 2013-05-16 DIAGNOSIS — G44099 Other trigeminal autonomic cephalgias (TAC), not intractable: Secondary | ICD-10-CM

## 2013-05-16 HISTORY — DX: Zoster without complications: B02.9

## 2013-05-16 MED ORDER — KETOROLAC TROMETHAMINE 30 MG/ML IJ SOLN
INTRAMUSCULAR | Status: AC
  Filled 2013-05-16: qty 1

## 2013-05-16 MED ORDER — DIPHENHYDRAMINE HCL 50 MG/ML IJ SOLN
25.0000 mg | Freq: Once | INTRAMUSCULAR | Status: AC
  Administered 2013-05-16: 25 mg via INTRAVENOUS

## 2013-05-16 MED ORDER — ONDANSETRON HCL 4 MG PO TABS: 4.0000 mg | ORAL_TABLET | Freq: Three times a day (TID) | ORAL | Status: DC | PRN

## 2013-05-16 MED ORDER — INDOMETHACIN 50 MG PO CAPS: 50.0000 mg | ORAL_CAPSULE | Freq: Three times a day (TID) | ORAL | Status: DC | PRN

## 2013-05-16 MED ORDER — DIPHENHYDRAMINE HCL 50 MG/ML IJ SOLN
INTRAMUSCULAR | Status: AC
  Filled 2013-05-16: qty 1

## 2013-05-16 MED ORDER — KETOROLAC TROMETHAMINE 30 MG/ML IJ SOLN
30.0000 mg | Freq: Once | INTRAMUSCULAR | Status: AC
  Administered 2013-05-16: 30 mg via INTRAMUSCULAR

## 2013-05-16 MED ORDER — ONDANSETRON 4 MG PO TBDP
ORAL_TABLET | ORAL | Status: AC
  Filled 2013-05-16: qty 1

## 2013-05-16 MED ORDER — ONDANSETRON 4 MG PO TBDP
4.0000 mg | ORAL_TABLET | Freq: Once | ORAL | Status: AC
  Administered 2013-05-16: 4 mg via ORAL

## 2013-05-16 NOTE — Discharge Instructions (Signed)
Please call our account specialist [facility](Maggy Mena) tomorrow for assistance with a referral to [facility] for continued evaluation of your headaches. Please use medications as prescribed for your headache. If symptoms become suddenly worse or severe, report to your nearest emergency room for assistance.

## 2013-05-16 NOTE — ED Notes (Signed)
C/o 2 day duration of HA, arm pain, fever; using tylenol for symptoms w minimal relief . NAD. Son in room to help translate

## 2013-05-16 NOTE — ED Provider Notes (Signed)
Medical screening examination/treatment/procedure(s) were performed by a resident physician or non-physician practitioner and as the supervising physician I was immediately available for consultation/collaboration.  Darel Ricketts, MD    Shawntae Lowy S Nejla Reasor, MD 05/16/13 2142 

## 2013-05-16 NOTE — ED Provider Notes (Signed)
CSN: [id number]     Arrival date & time 05/16/13  1734 History   First MD Initiated Contact with Patient 05/16/13 1849     Chief Complaint  Patient presents with  . Muscle Pain   (Consider location/radiation/quality/duration/timing/severity/associated sxs/prior Treatment) HPI Comments: Patient reports he has a headache that began yesterday that is located around his right eye and pain extends down his right cheek, across his right forehead and then pain radiates to his right shoulder and arm. Reviewing his old records, it would appear that patient has had several visits for same over the past 2+ years. Has yet to establish with PCP for further evaluation and management. Endorses associated mild nausea, blurred vision, photophobia.  No fever or recent head injury No loss of strength or sensation. No difficulty with speech, swallowing or balance.  Works as Doctor, hospitalcommercial painter Is a smoker Has tried two doses of tylenol at home with minimal relief.   The history is provided by the patient and a relative. The history is limited by a language barrier. A language interpreter was used.    Past Medical History  Diagnosis Date  . Herpes   . Shingles    History reviewed. No pertinent past surgical history. History reviewed. No pertinent family history. History  Substance Use Topics  . Smoking status: Current Some Day Smoker  . Smokeless tobacco: Not on file  . Alcohol Use: Yes     Comment: rare    Review of Systems  Constitutional: Negative.   HENT: Positive for sinus pressure. Negative for congestion, ear discharge, ear pain, facial swelling, hearing loss, mouth sores, postnasal drip, rhinorrhea, sneezing, sore throat and voice change.   Eyes: Positive for photophobia and visual disturbance. Negative for pain, discharge, redness and itching.  Respiratory: Negative.   Cardiovascular: Negative.   Gastrointestinal: Negative.   Endocrine: Negative for polydipsia, polyphagia and polyuria.   Genitourinary: Negative.   Musculoskeletal: Negative.   Skin: Negative.   Allergic/Immunologic: Negative for immunocompromised state.  Neurological: Positive for headaches. Negative for dizziness, tremors, seizures, syncope, speech difficulty, weakness, light-headedness and numbness.  Psychiatric/Behavioral: Negative for confusion.    Allergies  Review of patient's allergies indicates no known allergies.  Home Medications   Prior to Admission medications   Medication Sig Start Date End Date Taking? Authorizing Provider  acyclovir (ZOVIRAX) 400 MG tablet Take 1 tablet (400 mg total) by mouth 4 (four) times daily. 10/31/12   Jimmye Normanavid John Smith, NP  acyclovir (ZOVIRAX) 400 MG tablet Take 1 tablet (400 mg total) by mouth 4 (four) times daily. 02/03/13   Hayden Rasmussenavid Mabe, NP  cephALEXin (KEFLEX) 500 MG capsule Take 1 capsule (500 mg total) by mouth 4 (four) times daily. 01/23/13   Hayden Rasmussenavid Mabe, NP  gabapentin (NEURONTIN) 300 MG capsule 1 tab po at bedtime 1st day, 1 tablet bid second day, then 1 tablet tid 01/23/13   Hayden Rasmussenavid Mabe, NP  ibuprofen (ADVIL,MOTRIN) 200 MG tablet Take 400 mg by mouth every 6 (six) hours as needed for moderate pain.    Historical Provider, MD  methylPREDNISolone (MEDROL DOSEPAK) 4 MG tablet follow package directions 01/23/13   Hayden Rasmussenavid Mabe, NP  trimethoprim-polymyxin b (POLYTRIM) ophthalmic solution Place 1 drop into the right eye every 4 (four) hours. 01/23/13   Hayden Rasmussenavid Mabe, NP   BP 111/79  Pulse 87  Temp(Src) 98.1 F (36.7 C) (Oral)  Resp 16  SpO2 100% Physical Exam  Nursing note and vitals reviewed. Constitutional: He is oriented to person, place, and time. He  appears well-developed and well-nourished.  HENT:  Head: Normocephalic and atraumatic.  Right Ear: Hearing, tympanic membrane, external ear and ear canal normal. No drainage, swelling or tenderness. No mastoid tenderness. Tympanic membrane is not injected. No middle ear effusion.  Left Ear: Hearing, tympanic membrane,  external ear and ear canal normal. No drainage, swelling or tenderness. No mastoid tenderness. Tympanic membrane is not injected.  No middle ear effusion.  Nose: Nose normal.  Mouth/Throat: Uvula is midline, oropharynx is clear and moist and mucous membranes are normal. No oral lesions. No trismus in the jaw. No uvula swelling.  Eyes: Conjunctivae and EOM are normal. Pupils are equal, round, and reactive to light. Right eye exhibits no discharge. Left eye exhibits no discharge. No scleral icterus.  No ptosis  Neck: Trachea normal, normal range of motion, full passive range of motion without pain and phonation normal. Neck supple. No thyromegaly present.  Cardiovascular: Normal rate, regular rhythm and normal heart sounds.   Pulmonary/Chest: Effort normal and breath sounds normal. No respiratory distress. He has no wheezes.  Abdominal: Soft. Bowel sounds are normal. There is no tenderness.  Musculoskeletal: Normal range of motion.  Lymphadenopathy:    He has no cervical adenopathy.  Neurological: He is alert and oriented to person, place, and time. He has normal strength. No cranial nerve deficit or sensory deficit. He displays a negative Romberg sign. Coordination and gait normal. GCS eye subscore is 4. GCS verbal subscore is 5. GCS motor subscore is 6.  Facial movements symmetrical. No facial rash  Skin: Skin is warm and dry. No rash noted.  Psychiatric: He has a normal mood and affect. His behavior is normal.    ED Course  Procedures (including critical care time) Labs Review Labs Reviewed - No data to display  Imaging Review No results found.   MDM   1. Trigeminal autonomic cephalgias   Some improvement with toradol, [facility]ad[facility] and [facility]. No clinical indication of acute intracranial process. Will treat with indocin and zofran. Will have patient contact our patient access coordinator tomorrow (Maggy Mean) for referral to [facility] for further evaluation.   Jess BartersJennifer  [place], [place] 05/16/13 2012

## 2013-07-02 ENCOUNTER — Encounter (HOSPITAL_COMMUNITY): Payer: Self-pay | Admitting: Emergency Medicine

## 2013-07-02 ENCOUNTER — Emergency Department (HOSPITAL_COMMUNITY)
Admission: EM | Admit: 2013-07-02 | Discharge: 2013-07-02 | Disposition: A | Discharge status: Home / Self Care | Payer: Self-pay | Attending: Emergency Medicine | Admitting: Emergency Medicine

## 2013-07-02 DIAGNOSIS — Z79899 Other long term (current) drug therapy: Secondary | ICD-10-CM | POA: Insufficient documentation

## 2013-07-02 DIAGNOSIS — A6000 Herpesviral infection of urogenital system, unspecified: Secondary | ICD-10-CM | POA: Insufficient documentation

## 2013-07-02 DIAGNOSIS — Z792 Long term (current) use of antibiotics: Secondary | ICD-10-CM | POA: Insufficient documentation

## 2013-07-02 DIAGNOSIS — F17200 Nicotine dependence, unspecified, uncomplicated: Secondary | ICD-10-CM | POA: Insufficient documentation

## 2013-07-02 LAB — CBG MONITORING, ED: Glucose-Capillary: 85 mg/dL (ref 70–99)

## 2013-07-02 MED ORDER — ACYCLOVIR 400 MG PO TABS: 400.0000 mg | ORAL_TABLET | Freq: Three times a day (TID) | ORAL | Status: DC

## 2013-07-02 NOTE — Discharge Instructions (Signed)
Please read and follow all provided instructions.  Your diagnoses today include:  1. Herpes genitalis     Tests performed today include:  Vital signs. See below for your results today.   Medications prescribed:   Acyclovir - medication for flare of herpes infection  Home care instructions:  Follow any educational materials contained in this packet.  Follow-up instructions: Please follow-up with your primary care provider as needed for further evaluation of your symptoms.  If you do not have a primary care doctor -- see below for referral information.   Return instructions:   Please return to the Emergency Department if you experience worsening symptoms.   Please return if you have any other emergent concerns.  Additional Information:  Your vital signs today were: BP 115/62   Pulse 85   Temp(Src) 97.8 F (36.6 C) (Oral)   Resp 18   Ht 5\' 3"  (1.6 m)   Wt 190 lb (86.183 kg)   BMI 33.67 kg/m2 If your blood pressure (BP) was elevated above 135/85 this visit, please have this repeated by your doctor within one month. ---------------  Emergency Department Resource Guide 1) Find a Doctor and Pay Out of Pocket Although you won't have to find out who is covered by your insurance plan, it is a good idea to ask around and get recommendations. You will then need to call the office and see if the doctor you have chosen will accept you as a new patient and what types of options they offer for patients who are self-pay. Some doctors offer discounts or will set up payment plans for their patients who do not have insurance, but you will need to ask so you aren't surprised when you get to your appointment.  2) Contact Your Local Health Department Not all health departments have doctors that can see patients for sick visits, but many do, so it is worth a call to see if yours does. If you don't know where your local health department is, you can check in your phone book. The CDC also has a tool  to help you locate your state's health department, and many state websites also have listings of all of their local health departments.  3) Find a Walk-in Clinic If your illness is not likely to be very severe or complicated, you may want to try a walk in clinic. These are popping up all over the country in pharmacies, drugstores, and shopping centers. They're usually staffed by nurse practitioners or physician assistants that have been trained to treat common illnesses and complaints. They're usually fairly quick and inexpensive. However, if you have serious medical issues or chronic medical problems, these are probably not your best option.  No Primary Care Doctor: - Call Health Connect at  [phone] - they can help you locate a primary care doctor that  accepts your insurance, provides certain services, etc. - Physician Referral Service- [phone]  Chronic Pain Problems: Organization         Address  Phone   Notes  [facility]  [phone] Patients need to be referred by their primary care doctor.   Medication Assistance: Organization         Address  Phone   Notes  [facility] [address] [place], [place] [address] [phone] --Must be a resident of [facility] -- Must have NO insurance coverage whatsoever (no Medicaid/ Medicare, etc.) -- The pt. MUST have a primary care doctor that  directs their care regularly and follows them in the community   MedAssist  [phone]   [employer]  [phone]    Agencies that provide inexpensive medical care: Organization         Address  Phone   Notes  [facility]  [phone]   [facility]    [phone]   [facility] [address] [place], [place] [address] [phone]   [facility] [place] [address], [place] [phone]) [phone]   Planned Parenthood    [phone]    [facility]    [phone]) [phone]   [facility] and [facility]  [address], [place] Phone:  [phone], Fax:  [phone] Hours of Operation:  9 am - 6 pm, M-F.  Also accepts Medicaid/Medicare and self-pay.  [facility]  [address], [place] Phone: [phone], Fax: [phone]. Hours of Operation:  8:30 am - 5:30 pm, M-F.  Also accepts Medicaid and self-pay.  [facility] [place] [address], [place] Phone: [phone]   [facility] [address], [place], [place] [phone], Ext. [phone] Mondays & Thursdays: 7-9 AM.  First 15 patients are seen on a first come, first serve basis.    Medicaid-accepting [facility] Providers:  Organization         Address  Phone   Notes  [facility] [address], [place] [phone] Also accepts self-pay patients.  [facility] [address], [place]  [phone]) [phone]   [facility] [address], [place] [phone]) [phone]   [facility] [address] [place] [address], [place] [phone]   Renaye RakersVeita Bland [address], [place]   [phone]) [phone] Only accepts [place] Access [place] patients after they have their name applied to their card.   Self-Pay (no insurance) in [facility]:  Organization         Address  Phone   Notes  Sickle Cell Patients, [facility] [address], [place] [phone]   [facility] [address], [place] [phone]   [facility] [place]  [address], [place] [phone]) [phone]   [facility]/Dr. Osei-Bonsu  [address] [place] [address], [place] or [phone], [place] [phone]) [phone] Phone number for both [place] and [place] locations is the same.  [facility] [address], [place] [phone]   [facility] [address] [place] [address], [place] or [address] [phone]   [facility] [address], [place] [phone]) [phone], phone; [phone], fax Sees patients 1st and 3rd Saturday of every month.  Must not qualify for public or private insurance (i.e. Medicaid, Medicare, [place] Health Choice, Veterans' Benefits)  Household income should be no more than 200% of the poverty level The clinic cannot treat you if you are pregnant or think you are pregnant  Sexually transmitted diseases are not treated at the clinic.    Dental Care: Organization         Address  Phone  Notes  [facility] Department of [facility] [address], [place] [phone] Accepts children up  to age 42 who are enrolled in Medicaid or [place] Health Choice; pregnant women with a Medicaid card; and children who have applied for Medicaid or [place] Health Choice, but were declined, whose parents can pay a reduced fee at time of service.  [facility] of [facility] [place]  [address], [place] [phone]) [phone] Accepts children up to age 42 who are enrolled in [place] or [place] Health Choice; pregnant women with a Medicaid card; and children who have applied for Medicaid or [place] Health Choice, but were declined, whose parents can pay a reduced fee at time of service.  [facility]  [address], [place] [phone]) [phone] Patients are seen by appointment only. Walk-ins are not accepted. [facility] will see patients 42 years of age and older. Monday - Tuesday (8am-5pm) Most Wednesdays (8:30-5pm) $30 per visit, cash only  [facility]  [address], [facility] [phone] Patients are seen by appointment only. Walk-ins are not accepted. [facility] will see patients 42 years of age and older. One Wednesday  Evening (Monthly: Volunteer Based).  $30 per visit, cash only  [employer] [facility]  [phone] for adults; Children under age 334, call Graduate Pediatric Dentistry at [phone]. Children aged 564-14, please call [phone] to request a pediatric application.  Dental services are provided in all areas of dental care including fillings, crowns and bridges, complete and partial dentures, implants, gum treatment, root canals, and extractions. Preventive care is also provided. Treatment is provided to both adults and children. Patients are selected via a lottery and there is often a waiting list.   [facility] [address], [place]  [phone] [email or web address]   [facility] [address] [place] [address], [place], [place] [phone], Ext. [phone] Second and Fourth Thursday of each month, opens at 6:30 AM; Clinic ends at 9 AM.  Patients are seen on a first-come first-served basis, and a limited number are seen during each clinic.   [facility]  [address], [place], [place] [phone]) [phone]   Eligibility Requirements You must have lived in [place], [place], or [place] counties for at least the last three months.   You cannot be eligible for state or federal sponsored National Cityhealthcare insurance, including CIGNAVeterans Administration, [place]icaid, or [employer]Medicare.   You generally cannot be eligible for healthcare insurance through your employer.    How to apply: Eligibility screenings are held every Tuesday and Wednesday afternoon from 1:00 pm until 4:00 pm. You do not need an appointment for the interview!  [facility] [address], [place], [place] [phone]   [facility]  [phone]   [facility]  [phone]   [facility]  [phone]    Behavioral Health Resources in the Community: Intensive Outpatient Programs Organization         Address  Phone  Notes  [facility] [address], [place], [place] [phone]   [facility] [address], [place], [place] [phone]   [facility] [address], [place], [place]  [phone]   [facility] [address],  [place], [place] [phone] or [phone]   Substance Abuse Resources Organization         Address  Phone  Notes  [facility] Drug Services  [phone]   [facility]  [phone]   [facility] [place]  [phone]   Floydene Flock  [phone]   Residential & Outpatient Substance Abuse Program  [phone]   Psychological Services Organization         Address  Phone  Notes  [facility]  [phone]   [facility]  [phone]   [facility] [address], [place] [phone] or [phone]    Mobile Crisis Teams Organization         Address  Phone  Notes  [facility], Mobile Crisis Care Unit  [phone]   [facility]  [address] [place], [place] [phone]   Doristine Locks [address] [place] [phone]    Self-Help/Support Groups Organization         Address  Phone             Notes  [facility] [place] - variety of support groups  [phone]- I7437963 Call for more information  Narcotics Anonymous (NA), [facility] [address], [employer] [place]  2 meetings at this location   Statistician         Address  Phone  Notes  [facility] [address],    [place]  [phone]   [facility]  [address] [place] [address], [place], [place] [phone]   [facility] [address] [place], [place] [phone] Admissions: 8am-3pm M-F  [facility] [address],    [place], [place] [phone]   [facility] [address] [place],  [place], [place] [phone]   [facility] [address]  [place], [place] [phone]   [facility] - Intensive Outpatient [address], [place], [place] [phone]   [facility] ([facility] [address]  [place], [place] [phone] or [phone]   [facility] ([facility]) [address], [place], [place] [phone] Accepts Medicaid  [facility] [address]  [place] [phone] Substance Abuse/Addiction Treatment   [facility] Organization         Address  Phone  Notes  [facility]  [phone]   Angie Fava, PhD [address] [place], [place]   [phone] or [phone]   [facility] Behavioral   [address] [place], [place] [phone]   [facility] [address] [place], [place] [phone] Insurance/Medicaid/sponsorship through [facility] and Families [address]                                    [place], [place] [phone] Therapy/tele-psych/case  [facility] [address], [place] [phone]    Dr. Lolly Mustache  [phone]   [facility] [place]  [employer] [facility] Dept. 1) [address] [place] 2) [address], [place] 3)  [address], [place] [phone]  [phone]   [facility] Child Abuse Hotline 626-179-0659 or [phone] (After Hours)

## 2013-07-02 NOTE — ED Notes (Signed)
Offered pt a wheelchair to bring back and declined. Stated he wanted to walk

## 2013-07-02 NOTE — ED Notes (Addendum)
Pt here for bumps to penis for the past two days that are painful. Denies any urinary symptoms. Had a Rx for Acyclovir that was filled on 06/23/12. Has a hx of herpes. Also reports that when he gets cut at work takes a while to heal. Not sure if he has high blood sugar. No hx of diabetes in his family.

## 2013-07-02 NOTE — ED Provider Notes (Signed)
Medical screening examination/treatment/procedure(s) were performed by non-physician practitioner and as supervising physician I was immediately available for consultation/collaboration.   EKG Interpretation None        Adellyn Capek W Bralyn Espino, MD 07/02/13 1527 

## 2013-07-02 NOTE — ED Provider Notes (Signed)
CSN: [id number]     Arrival date & time 07/02/13  [id number] History   First MD Initiated Contact with Patient 07/02/13 1002     Chief Complaint  Patient presents with  . SEXUALLY TRANSMITTED DISEASE     (Consider location/radiation/quality/duration/timing/severity/associated sxs/prior Treatment) HPI Comments: Patient with history of herpes presents with 2 days of penile lesions consistent with previous herpes infection. No dysuria or penile discharge. No other lesions. Patient has been on acyclovir in the past.   Patient also raises concerns over slow wound healing. Patient states that when he cuts himself at work it takes 3 weeks for the cut to heal. No history of diabetes. Patient states that sometimes he urinates frequently. No polydipsia.  The onset of this condition was acute. The course is constant. Aggravating factors: none. Alleviating factors: none.    The history is provided by the patient.    Past Medical History  Diagnosis Date  . Herpes   . Shingles    History reviewed. No pertinent past surgical history. No family history on file. History  Substance Use Topics  . Smoking status: Current Some Day Smoker  . Smokeless tobacco: Not on file  . Alcohol Use: Yes     Comment: rare    Review of Systems  Constitutional: Negative for fever.  HENT: Negative for sore throat.   Eyes: Negative for discharge.  Gastrointestinal: Negative for rectal pain.  Genitourinary: Positive for frequency and penile pain. Negative for dysuria, discharge, genital sores and testicular pain.  Musculoskeletal: Negative for arthralgias.  Skin: Positive for rash.  Hematological: Negative for adenopathy.   Allergies  Review of patient's allergies indicates no known allergies.  Home Medications   Prior to Admission medications   Medication Sig Start Date End Date Taking? Authorizing Provider  acyclovir (ZOVIRAX) 400 MG tablet Take 1 tablet (400 mg total) by mouth 4 (four) times daily. 10/31/12    Jimmye Normanavid John Smith, NP  acyclovir (ZOVIRAX) 400 MG tablet Take 1 tablet (400 mg total) by mouth 4 (four) times daily. 02/03/13   Hayden Rasmussenavid Mabe, NP  cephALEXin (KEFLEX) 500 MG capsule Take 1 capsule (500 mg total) by mouth 4 (four) times daily. 01/23/13   Hayden Rasmussenavid Mabe, NP  gabapentin (NEURONTIN) 300 MG capsule 1 tab po at bedtime 1st day, 1 tablet bid second day, then 1 tablet tid 01/23/13   Hayden Rasmussenavid Mabe, NP  ibuprofen (ADVIL,MOTRIN) 200 MG tablet Take 400 mg by mouth every 6 (six) hours as needed for moderate pain.    Historical Provider, MD  indomethacin (INDOCIN) 50 MG capsule Take 1 capsule (50 mg total) by mouth 3 (three) times daily as needed for mild pain or moderate pain. 05/16/13   Ardis RowanJennifer Lee Presson, PA  methylPREDNISolone (MEDROL DOSEPAK) 4 MG tablet follow package directions 01/23/13   Hayden Rasmussenavid Mabe, NP  ondansetron (ZOFRAN) 4 MG tablet Take 1 tablet (4 mg total) by mouth every 8 (eight) hours as needed for nausea. 05/16/13   Ardis RowanJennifer Lee Presson, PA  trimethoprim-polymyxin b (POLYTRIM) ophthalmic solution Place 1 drop into the right eye every 4 (four) hours. 01/23/13   Hayden Rasmussenavid Mabe, NP   BP 115/62  Pulse 85  Temp(Src) 97.8 F (36.6 C) (Oral)  Resp 18  Ht 5\' 3"  (1.6 m)  Wt 190 lb (86.183 kg)  BMI 33.67 kg/m2  Physical Exam  Nursing note and vitals reviewed. Constitutional: He appears well-developed and well-nourished.  HENT:  Head: Normocephalic and atraumatic.  Eyes: Conjunctivae are normal.  Neck: Normal range of  motion. Neck supple.  Pulmonary/Chest: No respiratory distress.  Genitourinary: Uncircumcised. Penile erythema and penile tenderness present.  Vesicular lesions on an erythematous base noted on ventral aspect of the glans.  Neurological: He is alert.  Skin: Skin is warm and dry.  Psychiatric: He has a normal mood and affect.    ED Course  Procedures (including critical care time) Labs Review Labs Reviewed  CBG MONITORING, ED    Imaging Review No results found.   EKG  Interpretation None      10:29 AM Patient seen and examined.   Vital signs reviewed and are as follows: Filed Vitals:   07/02/13 1014  BP: 115/62  Pulse: 85  Temp: 97.8 F (36.6 C)  Resp: 18   Will give rx acyclovir.   Will check blood sugar and give PCP referral for wound healing concerns.  11:25 AM CBG normal range. Will discharge.    MDM   Final diagnoses:  Herpes genitalis   Exam supports herpes flare. Will treat with acyclovir.     Renne CriglerJoshua Aubri Gathright, PA-C 07/02/13 1125

## 2013-10-13 ENCOUNTER — Encounter (HOSPITAL_COMMUNITY): Payer: Self-pay | Admitting: Emergency Medicine

## 2013-10-13 ENCOUNTER — Emergency Department (HOSPITAL_COMMUNITY)
Admission: EM | Admit: 2013-10-13 | Discharge: 2013-10-13 | Disposition: A | Discharge status: Home / Self Care | Payer: Self-pay | Attending: Emergency Medicine | Admitting: Emergency Medicine

## 2013-10-13 DIAGNOSIS — B009 Herpesviral infection, unspecified: Secondary | ICD-10-CM | POA: Insufficient documentation

## 2013-10-13 DIAGNOSIS — K1370 Unspecified lesions of oral mucosa: Secondary | ICD-10-CM | POA: Insufficient documentation

## 2013-10-13 DIAGNOSIS — F17200 Nicotine dependence, unspecified, uncomplicated: Secondary | ICD-10-CM | POA: Insufficient documentation

## 2013-10-13 DIAGNOSIS — Z760 Encounter for issue of repeat prescription: Secondary | ICD-10-CM | POA: Insufficient documentation

## 2013-10-13 DIAGNOSIS — Z79899 Other long term (current) drug therapy: Secondary | ICD-10-CM | POA: Insufficient documentation

## 2013-10-13 DIAGNOSIS — K029 Dental caries, unspecified: Secondary | ICD-10-CM | POA: Insufficient documentation

## 2013-10-13 MED ORDER — ACYCLOVIR 400 MG PO TABS: 400.0000 mg | ORAL_TABLET | Freq: Four times a day (QID) | ORAL | Status: DC

## 2013-10-13 MED ORDER — HYDROCODONE-HOMATROPINE 5-1.5 MG/5ML PO SYRP: 5.0000 mL | ORAL_SOLUTION | Freq: Four times a day (QID) | ORAL | Status: DC | PRN

## 2013-10-13 NOTE — Discharge Instructions (Signed)
Please follow up with your primary care physician in 1-2 days. If you do not have one please call the [facility] number listed above. Please take the anti-viral as prescribed. Please keep your appointment with the dentist on Wednesday. Please read all discharge instructions and return precautions.    Herpes labial  (Cold Sore)  El herpes labial (herpes febril) es una infeccin cutnea causada por el virus del herpes simplex (HSV-1). El HSV-1 est muy relacionado con el virus que causa el herpes genital (HSV-2), pero no son iguales aunque ambos causen infecciones orales y Warehouse manager. El herpes labial son pequeas llagas llenas de lquido que se forman dentro de la boca o en los labios, [place], [place], [place], mejillas o dedos.  El herpes simplex se transmite fcilmente (se [place]) a Economist a travs del Pharmacologist personal, como los besos o el compartir utensilios personales. El tambin puede extenderse a otras partes del cuerpo, como a los ojos o a los genitales. El herpes labial se contagia hasta que las llagas se cicatrizan completamente. Generalmente se curan en 2 semanas.  Una vez que una persona se infecta, el herpes simplex permanece siempre en el organismo. Por lo tanto, no hay cura, y generalmente vuelve a aparecer cuando la persona est muy cansada, estresada, enferma o toma mucho sol. Los factores adicionales que pueden favorecer una recurrencia son los cambios hormonales de la menstruacin o el [place], ciertos frmacos y [place] fro.  CAUSAS  La causa del herpes labial es el virus del herpes simplex. El virus se contagia de Neomia Dear persona a otra a travs del contacto directo, como al besarse, tocar la zona afectada o compartir elementos personales como protector labial, afeitadoras o cubiertos.  SNTOMAS  La primera infeccin puede no causar sntomas. Si aparecen sntomas, tendrn diferentes etapas. De este modo se desarrolla el herpes labial:   [employer]  antes del brote, podr sentir pinchazos, picazn o sensacin ardiente.   Las [employer] de lquido aparecen [employer] labios, dentro de la boca, la nariz o en las [place].   Las ampollas comienzan a supurar un lquido Passenger transport manager.   Las ampollas se secan y aparece una Restaurant manager, fast food.   La costra se cae.  Los sntomas dependen si es el brote inicial o Retail buyer. Algunos otros sntomas del primer brote son:   Grant Ruts.   Dolor de Advertising copywriter.   Dolor de [place].   Dolores musculares.   Ganglios hinchados en el cuello.  DIAGNSTICO  El diagnstico se realiza segn los sntomas y la observacin de las llagas. En algunos casos, se pasa un hisopo en los labios y luego se examina en el laboratorio para Education officer, environmental un diagnstico final. Si no hay llagas, los anlisis de sangre podrn hallar el virus del herpes simplex.  TRATAMIENTO  No hay cura para el herpes labial y no existe una vacuna para el virus del herpes simplex. Dentro de las 2 semanas, la mayor parte de las Development worker, international aid sin tratamiento. Los medicamentos no curan la infeccin, pero algunos frmacos pueden ayudar a Engineer, materials asociado a las llagas, pueden impedir que el virus se multiplique y Customer service manager tiempo de curacin. Los medicamentos se presentan en forma de crema, gel, comprimidos o inyeccin.  INSTRUCCIONES PARA EL CUIDADO EN EL HOGAR   Solo tome medicamentos de venta libre o recetados para Chief Technology Officer, Dentist o fiebre, segn las indicaciones del mdico. No tome aspirina.   Use un  hisopo de algodn para aplicar crema o gel en las llagas.   No se toque las ampollas ni retire las Architectural technologist. Lave sus manos con frecuencia. No se toque los ojos sin antes lavarse las manos.   Evite los besos, el sexo oral y compartir artculos personales hasta que las llagas se curen.   Coloque una bolsa de hielo [employer] llagas durante 10 a 15 minutos para Acupuncturist.   Evite las comidas  calientes, fras o saladas ya que pueden lastimar la boca. Consuma una dieta blanda para evitar la irritacin de las llagas. Use un sorbete si siente dolor al beber con un vaso.   Mantenga las llagas higienizadas y secas para evitar la infeccin en otros tejidos.   Evite el sol y evite las situaciones de estrs si es lo que causa las llagas. Si el sol causa las llagas, aplique pantalla solar sobre los labios antes de salir al sol.  SOLICITE ATENCIN MDICA SI:   Tiene fiebre o sntomas persistentes durante ms de 2  3 das.   Tiene fiebre y los sntomas [address].   Observa pus, un lquido que no es claro, en las llagas.   El enrojecimiento se expande.   Siente dolor o irritacin en el ojo.   Tiene llagas en los genitales.   Las llagas no se curan en 2 semanas.   Su sistema inmunolgico est debilitado.   Tiene frecuentes recurrencias del herpes labial.  ASEGRESE DE QUE:   Comprende estas instrucciones.  Controlar su enfermedad.  Solicitar ayuda de inmediato si no mejora o si empeora. Document Released: 01/04/2005 Document Revised: 09/29/2011 [facility] Patient Information 2015 [place], [place]. This information is not intended to replace advice given to you by your health care provider. Make sure you discuss any questions you have with your health care provider. Cuidado de los dientes y visitas al Emergency planning/management officer  (Dental Care and Dentist Visits) El cuidado dental favorece la buena salud en general. Las visitas regulares al odontlogo evitarn el dolor dental, sangrado, infecciones y otros problemas de salud ms graves en el futuro. Es Primary school teacher la boca sana, porque las enfermedades de los dientes, las encas y otros tejidos de la boca pueden extenderse a otras reas del cuerpo. Algunas enfermedades, como la diabetes, enfermedades cardacas y Government social research officer se han relacionado con una mala salud oral.  Visite a su odontlogo cada seis meses. Si tiene Research scientist (physical sciences), como dolor de [place] o rotura de dientes consulte inmediatamente. Si concurre regularmente al [employer], podr [employer] antes de [place]. Es ms fcil realizar un tratamiento en las primeras etapas.  QU ESPERAR EN UNA VISITA AL DENTISTA  El odontlogo har un diagnstico de los problemas de salud de la boca e indicar el tratamiento [place]. En su visita habitual al odontlogo podr recibir:   Neomia Dear limpieza suave de los dientes y las encas. Incluye raspado y pulido. Esta prctica eliminar la sustancia pegajosa que cubre los dientes y las encas (placa). La placa se forma en la boca despus de comer. Con el tiempo, la placa se endurece en los dientes y se forma sarro. Si el sarro no se elimina regularmente, puede causar problemas. La limpieza tambin ayuda a [employer].  Radiografas peridicas. Estas imgenes de los dientes y del [employer] sostiene le permitirn al dentista evaluar la salud de sus dientes.  Tratamientos peridicos con flor. El flor es un mineral natural que se ha comprobado que fortalece los dientes. [place]  con flor consiste en la aplicacin de un gel o barniz de flor [employer]. Se realiza con ms frecuencia en los nios.  Examen de la boca, [place], [place], los dientes y las encas para ver si hay problemas de salud bucal como:  Caries (caries dentales). Es la erosin del diente debido a la placa, Insurance claims handler y el cido en la boca. Lo mejor es tratar la caries cuando es pequea.  Inflamacin de las encas causada por la acumulacin de placa (gingivitis).  Problemas con la boca o dientes mal formados o mal alineados.  Cncer bucal u otras enfermedades de los tejidos blandos o de las [place].  MANTENGA LA SALUD DE SUS DIENTES Y ENCAS  Para tener dientes y encas sanos siga estas pautas generales, as como asesoramiento especfico de su dentista:   Hgase una limpieza dental con el dentista cada seis  meses.  Cepllese dos veces al da con una crema dental con flor.  Pase hilo dental todos los das.  Pregntele al dentista si necesita suplementos, tratamientos o dentfrico con flor.  Consuma una dieta saludable. Reduzca el consumo de alimentos y bebidas con azcar agregada.  Evite fumar. TRATAMIENTO PARA LOS PROBLEMAS DE SALUD ORALES  Si tiene problemas de [employer] boca, el tratamiento variar segn el estado actual de los dientes y las encas.   Su mdico le recomendar una buena higiene oral en cada visita.  Para las caries, la gingivitis u otras enfermedades de la boca, el mdico llevar a cabo un procedimiento para tratar el problema. Esto se realiza generalmente en otra visita programada. En algunos casos lo derivar a Probation officer para tratar problemas especficos de los dientes o para realizar [place]. SOLICITE ATENCIN ODONTOLGICA DE INMEDIATO SI:   Siente dolor, tiene sangrado o sensibilidad en las encas, los dientes, la mandbula o la zona de la boca.  Un diente permanente se afloja o se separa de la cavidad de la enca.  Ha sufrido un golpe o una lesin en la boca o en la zona de la [place]. Document Released: 09/29/2011 [facility] Patient Information 2015 [place], [place]. This information is not intended to replace advice given to you by your health care provider. Make sure you discuss any questions you have with your health care provider.

## 2013-10-13 NOTE — ED Provider Notes (Signed)
CSN: [id number]     Arrival date & time 10/13/13  0935 History  This chart was scribed for non-physician practitioner working with Rolland Porter, MD by Elveria Rising, ED Scribe. This patient was seen in room TR07C/TR07C and the patient's care was started at 10:24 AM.   Chief Complaint  Patient presents with  . Mouth Lesions  . Medication Refill   The history is provided by the patient. A language interpreter was used.   HPI Comments: Gabriel Ball is a 43 y.o. male with history of herpes who presents to the Emergency Department complaining of severe herpetic mouth lesion for two days. Patient reports historical treatment of his outbreaks with Acyclovir.  Patient reports right molar dental pain. He was prescribed Clindamycin and completed the course. He is scheduled to have the tooth extracted in four days. He denies any new symptoms with his right molar pain. Denies any pain to the tooth.   Patient denies fever or chills.    Past Medical History  Diagnosis Date  . Herpes   . Shingles    History reviewed. No pertinent past surgical history. No family history on file. History  Substance Use Topics  . Smoking status: Current Some Day Smoker  . Smokeless tobacco: Not on file  . Alcohol Use: Yes     Comment: rare    Review of Systems  Constitutional: Negative for fever and chills.  HENT: Positive for mouth sores.   All other systems reviewed and are negative.  Allergies  Review of patient's allergies indicates no known allergies.  Home Medications   Prior to Admission medications   Medication Sig Start Date End Date Taking? Authorizing Provider  acyclovir (ZOVIRAX) 400 MG tablet Take 400 mg by mouth 4 (four) times daily.   Yes Historical Provider, MD  acyclovir (ZOVIRAX) 400 MG tablet Take 1 tablet (400 mg total) by mouth 4 (four) times daily. 10/13/13   Corbin Falck L Garret Teale, PA-C  HYDROcodone-homatropine (HYCODAN) 5-1.5 MG/5ML syrup Take 5 mLs by mouth every 6 (six) hours as  needed for cough. 10/13/13   Lise Auer Casey Fye, PA-C   Triage Vitals: BP 103/57  Pulse 64  Temp(Src) 98.5 F (36.9 C) (Oral)  Resp 16  Ht  (1.651 m)  Wt 190 lb (86.183 kg)  BMI 31.62 kg/m2  SpO2 99%  Physical Exam  Nursing note and vitals reviewed. Constitutional: He is oriented to person, place, and time. He appears well-developed and well-nourished. No distress.  HENT:  Head: Normocephalic and atraumatic.  Right Ear: External ear normal.  Left Ear: External ear normal.  Nose: Nose normal.  Mouth/Throat: Uvula is midline, oropharynx is clear and moist and mucous membranes are normal. No trismus in the jaw. Abnormal dentition. Dental caries present. No uvula swelling.  Sublingual and submental spaces are soft.   Eyes: Conjunctivae are normal.  Neck: Normal range of motion. Neck supple.  Cardiovascular: Normal rate, regular rhythm, normal heart sounds and intact distal pulses.   Pulmonary/Chest: Effort normal and breath sounds normal.  Abdominal: Soft.  Musculoskeletal: Normal range of motion.  Neurological: He is alert and oriented to person, place, and time.  Skin: Skin is warm and dry. Lesion (left side lower lip: gray shallow ulcer) noted. He is not diaphoretic.  Psychiatric: He has a normal mood and affect.    ED Course  Procedures (including critical care time) Medications - No data to display  COORDINATION OF CARE: 11:32 AM- Discussed treatment plan with patient at bedside and patient agreed  to plan.   Labs Review Labs Reviewed - No data to display  Imaging Review No results found.   EKG Interpretation None      MDM   Final diagnoses:  Herpes infection    Filed Vitals:   10/13/13 0946  BP: 103/57  Pulse: 64  Temp: 98.5 F (36.9 C)  Resp: 16   Afebrile, NAD, non-toxic appearing, AAOx4.   1) Herpes: Exam supports herpes flare. Will treat with acyclovir.   2) Dental Pain: Patient with toothache.  No gross abscess.  Exam unconcerning for  Ludwig's angina or spread of infection.  Patient finished Clindamycin course.  Advised to keep follow up appointment with dentist on Wednesday.    I personally performed the services described in this documentation, which was scribed in my presence. The recorded information has been reviewed and is accurate.    Jeannetta Ellis, PA-C 10/13/13 1152

## 2013-10-13 NOTE — ED Notes (Signed)
Friend/translator stated, He has herpes and has a place in his mouth and needs medication refill

## 2013-10-14 NOTE — ED Provider Notes (Signed)
Medical screening examination/treatment/procedure(s) were performed by non-physician practitioner and as supervising physician I was immediately available for consultation/collaboration.   EKG Interpretation None        Joclyn Alsobrook, MD 10/14/13 1520 

## 2013-11-10 ENCOUNTER — Encounter (HOSPITAL_COMMUNITY): Payer: Self-pay | Admitting: Emergency Medicine

## 2013-11-10 ENCOUNTER — Emergency Department (HOSPITAL_COMMUNITY)
Admission: EM | Admit: 2013-11-10 | Discharge: 2013-11-10 | Disposition: A | Discharge status: Home / Self Care | Payer: Self-pay | Attending: Emergency Medicine | Admitting: Emergency Medicine

## 2013-11-10 DIAGNOSIS — Z79891 Long term (current) use of opiate analgesic: Secondary | ICD-10-CM | POA: Insufficient documentation

## 2013-11-10 DIAGNOSIS — K088 Other specified disorders of teeth and supporting structures: Secondary | ICD-10-CM | POA: Insufficient documentation

## 2013-11-10 DIAGNOSIS — Z8619 Personal history of other infectious and parasitic diseases: Secondary | ICD-10-CM | POA: Insufficient documentation

## 2013-11-10 DIAGNOSIS — K0889 Other specified disorders of teeth and supporting structures: Secondary | ICD-10-CM

## 2013-11-10 DIAGNOSIS — Z720 Tobacco use: Secondary | ICD-10-CM | POA: Insufficient documentation

## 2013-11-10 DIAGNOSIS — K029 Dental caries, unspecified: Secondary | ICD-10-CM | POA: Insufficient documentation

## 2013-11-10 MED ORDER — HYDROCODONE-ACETAMINOPHEN 5-325 MG PO TABS: 1.0000 | ORAL_TABLET | Freq: Four times a day (QID) | ORAL | Status: DC | PRN

## 2013-11-10 MED ORDER — OXYCODONE-ACETAMINOPHEN 5-325 MG PO TABS
1.0000 | ORAL_TABLET | Freq: Once | ORAL | Status: AC
Start: 2013-11-10 — End: 2013-11-10
  Administered 2013-11-10: 1 via ORAL
  Filled 2013-11-10: qty 1

## 2013-11-10 MED ORDER — PENICILLIN V POTASSIUM 500 MG PO TABS: 500.0000 mg | ORAL_TABLET | Freq: Four times a day (QID) | ORAL | Status: AC

## 2013-11-10 NOTE — Discharge Instructions (Signed)
Follow-up with your dentist on Monday. Use hydrocodone 1-2 pills every 4-6 hours as needed for pain. Use penicillin 1 tablet 4 times a day for infection. Return to the ER if you develop a high fever, nausea, vomiting, drooling, voice change, sore throat, neck stiffness or if you're unable to open your mouth.  Dolor dental (Dental Pain) Usted ha consultado con el profesional que lo asiste porque sufre dolor en un diente. ste puede estar ocasionado en caries dentales, las que exponen el nervio del diente al aire, y a temperaturas fras o calientes, lo que Sports administratorproduce dolor. Puede provenir de una infeccin o absceso (tambin denominado fornculo) alrededor del diente, que tambin suele ser causado por caries dentales. Esto produce el dolor que usted siente. DIAGNSTICO El profesional que lo asiste puede diagnosticar el problema con un examen bucal. TRATAMIENTO  Si la causa es una infeccin, puede tratarse con antibiticos (medicamentos que combaten grmenes) y Media plannermedicamentos que Corporate investment bankeralivien el dolor, como le ha recetado el profesional que lo asiste. Tome la medicacin como se le indic.  Utilice los medicamentos de venta libre o de prescripcin para Chief Technology Officerel dolor, Environmental health practitionerel malestar o la Accordfiebre, segn se lo indique el profesional que lo asiste.  Debido a que Nurse, adultel dolor dental generalmente es causado por infeccin o por una enfermedad dental, es aconsejable que acuda a su dentista lo antes posible para que le realice un tratamiento ms profundo. SOLICITE ATENCIN MDICA DE INMEDIATO SI: El examen y el tratamiento que recibi hoy fue indicado slo para hacer frente a la Teaching laboratory technicianurgencia. No constituye un sustituto de la atencin mdica o Musiciandental completa. Si su problema empeora o surgen nuevos sntomas (problemas) y no puede concertar una cita con su odontlogo para un pronto seguimiento, llame o acuda nuevamente a Educational psychologisteste centro. SOLICITE ATENCIN MDICA DE INMEDIATO SI:  Tiene fiebre.  Presenta enrojecimiento e hinchazn en el  rostro, la mandbula o el cuello.  No puede abrir Government social research officerla boca.  Siente un dolor intenso que no puede ser Intelcontrolado con los medicamentos. EST SEGURO QUE:   Comprende las instrucciones para el alta mdica.  Controlar su enfermedad.  Solicitar atencin mdica de inmediato segn las indicaciones. Document Released: 01/04/2005 Document Revised: 03/29/2011 [facility] Patient Information 2015 [place], [place]. This information is not intended to replace advice given to you by your health care provider. Make sure you discuss any questions you have with your health care provider.

## 2013-11-10 NOTE — ED Notes (Signed)
Declined W/C at D/C and was escorted to lobby by RN. 

## 2013-11-10 NOTE — ED Notes (Signed)
Pt in c/o continued dental pain, pt states he was seen at his dentist office and they opened an area above his gum an put a stitch in, and states this stitch is bothering him and he wants it removed

## 2013-11-10 NOTE — ED Provider Notes (Signed)
CSN: [id number]     Arrival date & time 11/10/13  1021 History   This chart was scribed for a non-physician practitioner, Jinny SandersJoseph Bailei Buist, PA-C working with Linwood DibblesJon Knapp, MD by [place]azil[place]Jordan Peace, ED Scribe. The patient was seen in TR11C/TR11C. The patient's care was started at 12:19 PM.     Chief Complaint  Patient presents with  . Dental Pain      Patient is a 43 y.o. male presenting with tooth pain. The history is provided by the patient. No language interpreter was used.  Dental Pain Associated symptoms: no fever    HPI Comments: Gabriel Ball is a 43 y.o. male who presents to the Emergency Department complaining of dental pain to upper right aspect of mouth onset 10 days ago. Pt reports he was seen at his dentist office previously where they opened up an area above his gum and put stiches in. He explains that sutures are bothering him and wants to get them removed here in ED. He notes purulent drainage from affected site with right sided facial pain. Pt denies fever or vomiting.    Past Medical History  Diagnosis Date  . Herpes   . Shingles    History reviewed. No pertinent past surgical history. History reviewed. No pertinent family history. History  Substance Use Topics  . Smoking status: Current Some Day Smoker  . Smokeless tobacco: Not on file  . Alcohol Use: Yes     Comment: rare    Review of Systems  Constitutional: Negative for fever.  HENT: Positive for dental problem.   Gastrointestinal: Negative for vomiting.      Allergies  Review of patient's allergies indicates no known allergies.  Home Medications   Prior to Admission medications   Medication Sig Start Date End Date Taking? Authorizing Provider  acyclovir (ZOVIRAX) 400 MG tablet Take 400 mg by mouth 4 (four) times daily.    Historical Provider, MD  acyclovir (ZOVIRAX) 400 MG tablet Take 1 tablet (400 mg total) by mouth 4 (four) times daily. 10/13/13   Jennifer L Piepenbrink, PA-C  HYDROcodone-acetaminophen  (NORCO/VICODIN) 5-325 MG per tablet Take 1-2 tablets by mouth every 6 (six) hours as needed for moderate pain or severe pain. 11/10/13   Monte FantasiaJoseph W Bernisha Verma, PA-C  HYDROcodone-homatropine [facility](HYCODAN) 5-1.5 MG/5ML syrup Take 5 mLs by mouth every 6 (six) hours as needed for cough. 10/13/13   Jennifer L Piepenbrink, PA-C  penicillin v potassium (VEETID) 500 MG tablet Take 1 tablet (500 mg total) by mouth 4 (four) times daily. 11/10/13 11/17/13  Monte FantasiaJoseph W Byrd Rushlow, PA-C   BP 100/66  Pulse 66  Temp(Src) 98.1 F (36.7 C) (Oral)  Resp 18  SpO2 100% Physical Exam  Nursing note and vitals reviewed. Constitutional: He is oriented to person, place, and time. He appears well-developed and well-nourished. No distress.  HENT:  Head: Normocephalic and atraumatic.  Mouth/Throat: Uvula is midline, oropharynx is clear and moist and mucous membranes are normal. No trismus in the jaw. Dental caries present. No dental abscesses or uvula swelling. No oropharyngeal exudate, posterior oropharyngeal edema, posterior oropharyngeal erythema or tonsillar abscesses.  Dental carries in right upper canine, lateral incisors and premolars. R Lateral incisor, second pre-molar removed with vicryl sutures placed. No obvious erythema, edema, purulent discharge, gross abscess.  Eyes: Conjunctivae and EOM are normal.  Neck: Neck supple. No tracheal deviation present.  Cardiovascular: Normal rate.   Pulmonary/Chest: Effort normal. No respiratory distress.  Musculoskeletal: Normal range of motion.  Neurological: He is alert and oriented  to person, place, and time.  Skin: Skin is warm and dry.  Psychiatric: He has a normal mood and affect. His behavior is normal.    ED Course  Procedures (including critical care time) Labs Review Labs Reviewed - No data to display  Results for orders placed during the hospital encounter of 07/02/13  CBG MONITORING, ED      Result Value Ref Range   Glucose-Capillary 85  70 - 99 mg/dL   No results  found.   Imaging Review No results found.   EKG Interpretation None     Medications  oxyCODONE-acetaminophen (PERCOCET/ROXICET) 5-325 MG per tablet 1 tablet (1 tablet Oral Given 11/10/13 1039)   12:24 PM- Treatment plan was discussed with patient who verbalizes understanding and agrees.   MDM   Final diagnoses:  Pain, dental   Patient with toothache.  No gross abscess.  Exam unconcerning for Ludwig's angina or spread of infection.  Will treat with penicillin and pain medicine.  Urged patient to follow-up with dentist.    BP 98/62  Pulse 66  Temp(Src) 98.8 F (37.1 C) (Oral)  Resp 16  SpO2 99%  I personally performed the services described in this documentation, which was scribed in my presence. The recorded information has been reviewed and is accurate.  Signed,  Ladona MowJoe Caramia Boutin, PA-C 6:07 PM   Monte FantasiaJoseph W Shuayb Schepers, PA-C 11/10/13 1806  Monte FantasiaJoseph W Tnia Anglada, PA-C 11/10/13 1806  Monte FantasiaJoseph W Alzina Golda, PA-C 11/10/13 [phone]

## 2013-11-13 NOTE — ED Provider Notes (Signed)
Medical screening examination/treatment/procedure(s) were performed by non-physician practitioner and as supervising physician I was immediately available for consultation/collaboration.    Tayten Bergdoll, MD 11/13/13 1519 

## 2013-11-15 ENCOUNTER — Encounter (HOSPITAL_COMMUNITY): Payer: Self-pay | Admitting: Emergency Medicine

## 2013-11-15 ENCOUNTER — Emergency Department (HOSPITAL_COMMUNITY)
Admission: EM | Admit: 2013-11-15 | Discharge: 2013-11-15 | Disposition: A | Discharge status: Home / Self Care | Payer: Self-pay | Attending: Emergency Medicine | Admitting: Emergency Medicine

## 2013-11-15 DIAGNOSIS — Z792 Long term (current) use of antibiotics: Secondary | ICD-10-CM | POA: Insufficient documentation

## 2013-11-15 DIAGNOSIS — K006 Disturbances in tooth eruption: Secondary | ICD-10-CM | POA: Insufficient documentation

## 2013-11-15 DIAGNOSIS — K91840 Postprocedural hemorrhage and hematoma of a digestive system organ or structure following a digestive system procedure: Secondary | ICD-10-CM | POA: Insufficient documentation

## 2013-11-15 DIAGNOSIS — B009 Herpesviral infection, unspecified: Secondary | ICD-10-CM | POA: Insufficient documentation

## 2013-11-15 DIAGNOSIS — Z720 Tobacco use: Secondary | ICD-10-CM | POA: Insufficient documentation

## 2013-11-15 DIAGNOSIS — K1379 Other lesions of oral mucosa: Secondary | ICD-10-CM

## 2013-11-15 DIAGNOSIS — Z79899 Other long term (current) drug therapy: Secondary | ICD-10-CM | POA: Insufficient documentation

## 2013-11-15 DIAGNOSIS — Z760 Encounter for issue of repeat prescription: Secondary | ICD-10-CM | POA: Insufficient documentation

## 2013-11-15 MED ORDER — ACYCLOVIR 400 MG PO TABS: 400.0000 mg | ORAL_TABLET | Freq: Four times a day (QID) | ORAL | Status: DC

## 2013-11-15 NOTE — ED Provider Notes (Signed)
CSN: [id number]     Arrival date & time 11/15/13  2037 History   First MD Initiated Contact with Patient 11/15/13 2059     Chief Complaint  Patient presents with  . Dental Problem   HPI  Patint is a 43 y.o. Male who presents to the ED for dental bleeding.  Patient states that he was seen at a dental clinic 14 days ago and had an area above his right frontal incisor which was opened up and sutured.  Patient had the stiches removed 10 days later here in the ED on 11/10/13.  Patient states that tonight while he was eating dinner he experienced some bleeding from his gums which would not stop.  He has also noticed a new HSV lesion on his tongue.  He has a history of HSV lesions and was previously on acyclovir for the same.  Patient states that he has been trying to take ibuprofen for his oral pain, but has had little relief.  Patient would like a refill on his acyclovir.     Past Medical History  Diagnosis Date  . Herpes   . Shingles    History reviewed. No pertinent past surgical history. No family history on file. History  Substance Use Topics  . Smoking status: Current Some Day Smoker  . Smokeless tobacco: Not on file  . Alcohol Use: Yes     Comment: rare    Review of Systems  Constitutional: Negative for fever, chills and fatigue.  HENT: Positive for dental problem. Negative for congestion, drooling, nosebleeds, rhinorrhea, sore throat, tinnitus and trouble swallowing.   Respiratory: Negative for chest tightness, shortness of breath and wheezing.   Cardiovascular: Negative for chest pain and palpitations.  All other systems reviewed and are negative.     Allergies  Review of patient's allergies indicates no known allergies.  Home Medications   Prior to Admission medications   Medication Sig Start Date End Date Taking? Authorizing Provider  acyclovir (ZOVIRAX) 400 MG tablet Take 400 mg by mouth 4 (four) times daily.    Historical Provider, MD  acyclovir (ZOVIRAX) 400 MG  tablet Take 1 tablet (400 mg total) by mouth 4 (four) times daily. 10/13/13   Jennifer L Piepenbrink, PA-C  acyclovir (ZOVIRAX) 400 MG tablet Take 1 tablet (400 mg total) by mouth 4 (four) times daily. 11/15/13   Earma Nicolaou A Forcucci, PA-C  HYDROcodone-acetaminophen (NORCO/VICODIN) 5-325 MG per tablet Take 1-2 tablets by mouth every 6 (six) hours as needed for moderate pain or severe pain. 11/10/13   Monte FantasiaJoseph W Mintz, PA-C  HYDROcodone-homatropine [facility](HYCODAN) 5-1.5 MG/5ML syrup Take 5 mLs by mouth every 6 (six) hours as needed for cough. 10/13/13   Jennifer L Piepenbrink, PA-C  penicillin v potassium (VEETID) 500 MG tablet Take 1 tablet (500 mg total) by mouth 4 (four) times daily. 11/10/13 11/17/13  Monte FantasiaJoseph W Mintz, PA-C   BP 118/64  Pulse 81  Temp(Src) 98.8 F (37.1 C) (Oral)  Resp 18  Ht 5\' 5"  (1.651 m)  Wt 190 lb (86.183 kg)  BMI 31.62 kg/m2  SpO2 98% Physical Exam  Nursing note and vitals reviewed. Constitutional: He is oriented to person, place, and time. He appears well-developed and well-nourished. No distress.  HENT:  Head: Normocephalic and atraumatic.  Right Ear: Hearing, tympanic membrane and external ear normal.  Left Ear: Hearing, tympanic membrane and external ear normal.  Nose: Nose normal.  Mouth/Throat: Oropharynx is clear and moist. No oropharyngeal exudate.  Poor dentition with recession of the  gum lines over the majority of the teeth.  There is a small 0.5 cm shallow laceration of the right frontal incisor with no evidence of erythema or tenderness to palpation.  No active bleeding is seen.  There is a vessicular 0.5 cm diameter lesion on an erythematous base on the left lateral tongue.    Eyes: Conjunctivae and EOM are normal. Pupils are equal, round, and reactive to light. No scleral icterus.  Neck: Normal range of motion. Neck supple. No JVD present. No tracheal deviation present. No thyromegaly present.  Cardiovascular: Normal rate, regular rhythm, normal heart sounds and  intact distal pulses.  Exam reveals no gallop and no friction rub.   No murmur heard. Pulmonary/Chest: Effort normal and breath sounds normal. No respiratory distress. He has no wheezes. He has no rales. He exhibits no tenderness.  Lymphadenopathy:    He has no cervical adenopathy.  Neurological: He is alert and oriented to person, place, and time.  Skin: Skin is warm and dry. He is not diaphoretic.  Psychiatric: He has a normal mood and affect. His behavior is normal. Judgment and thought content normal.    ED Course  Procedures (including critical care time) Labs Review Labs Reviewed - No data to display  Imaging Review No results found.   EKG Interpretation None      MDM   Final diagnoses:  Oral bleeding  Recurrent HSV (herpes simplex virus)   Patient is a 43 y.o. Male who presents with dental problem.  Patient has small laceration of the anterior superior gingiva with no evidence of active bleeding.  There is an HSV lesion on the left lateral tongue.  Patient is currently on Pen VK and ibuprofen for pain.  He is requesting a refill of his acyclovir.  Will discharge home with acyclovir.  Patient instructed to eat a soft diet.  If bleeding returns he was instructed to hold pressure and if that did not work after 45 minutes to return to the ED.  Patient to follow-up with his dentist for further evaluation and care.  Patient states understanding and agreement.  Patient is stable for discharge.      Eben Burowourtney A Forcucci, PA-C 11/15/13 2126

## 2013-11-15 NOTE — ED Notes (Signed)
Pt A&OX4, ambulatory at d/c with steady gait, NAD 

## 2013-11-15 NOTE — ED Provider Notes (Signed)
Medical screening examination/treatment/procedure(s) were performed by non-physician practitioner and as supervising physician I was immediately available for consultation/collaboration.    Linwood DibblesJon Lacrystal Barbe, MD 11/15/13 2131

## 2013-11-15 NOTE — ED Notes (Signed)
Pt. reports bleeding at upper gum today , states sutures removed at upper gum yesterday at dentist clinic , no bleeding atv triage .

## 2013-11-15 NOTE — Discharge Instructions (Signed)
Herpes labial  ([place])  El herpes labial (herpes febril) es una infeccin cutnea causada por el virus del herpes simplex (HSV-1). El HSV-1 est muy relacionado con el virus que causa el herpes genital (HSV-2), pero no son iguales aunque ambos causen infecciones orales y Teaching laboratory technician. El herpes labial son pequeas llagas llenas de lquido que se forman dentro de la boca o en los labios, [place], [place], [place], mejillas o dedos.  El herpes simplex se transmite fcilmente (se [place]) a Producer, television/film/video a travs del Diplomatic Services operational officer personal, como los besos o el compartir utensilios personales. El tambin puede extenderse a otras partes del cuerpo, como a los ojos o a los genitales. El herpes labial se contagia hasta que las llagas se cicatrizan completamente. Generalmente se curan en 2 semanas.  Una vez que una persona se infecta, el herpes simplex permanece siempre en el organismo. Por lo tanto, no hay cura, y generalmente vuelve a aparecer cuando la persona est muy cansada, estresada, enferma o toma mucho sol. Los factores adicionales que pueden favorecer una recurrencia son los cambios hormonales de la menstruacin o el [place], ciertos frmacos y [place] fro.  CAUSAS  La causa del herpes labial es el virus del herpes simplex. El virus se contagia de Ardelia Mems persona a otra a travs del contacto directo, como al besarse, tocar la zona afectada o compartir elementos personales como protector labial, afeitadoras o cubiertos.  SNTOMAS  La primera infeccin puede no causar sntomas. Si aparecen sntomas, tendrn diferentes etapas. De este modo se desarrolla el herpes labial:   [employer] antes del brote, podr sentir pinchazos, picazn o sensacin ardiente.   Las [employer] de lquido aparecen [employer] labios, dentro de la boca, la nariz o en las [place].   Las ampollas comienzan a supurar un lquido Engineer, structural.   Las ampollas se secan y aparece una Journalist, newspaper.   La costra  se cae.  Los sntomas dependen si es el brote inicial o Engineer, building services. Algunos otros sntomas del primer brote son:   Cristy Hilts.   Dolor de Investment banker, operational.   Dolor de [place].   Dolores musculares.   Ganglios hinchados en el cuello.  [place] se realiza segn los sntomas y la observacin de las llagas. En algunos casos, se pasa un hisopo en los labios y luego se examina en el laboratorio para Optometrist un diagnstico final. Si no hay llagas, los anlisis de sangre podrn hallar el virus del herpes simplex.  TRATAMIENTO  No hay cura para el herpes labial y no existe una vacuna para el virus del herpes simplex. Dentro de las 2 semanas, la mayor parte de las Doctor, general practice sin tratamiento. Los medicamentos no curan la infeccin, pero algunos frmacos pueden ayudar a Best boy asociado a las llagas, pueden impedir que el virus se multiplique y Training and development officer tiempo de curacin. Los medicamentos se presentan en forma de crema, gel, comprimidos o inyeccin.  INSTRUCCIONES PARA EL CUIDADO EN EL HOGAR   Solo tome medicamentos de venta libre o recetados para Conservation officer, historic buildings, Tree surgeon o fiebre, segn las indicaciones del mdico. No tome aspirina.   Use un hisopo de algodn para aplicar crema o gel en las llagas.   No se toque las ampollas ni retire las Surveyor, mining. Lave sus manos con frecuencia. No se toque los ojos sin antes lavarse las manos.   Evite los besos, el sexo oral y compartir artculos personales hasta que las llagas se  curen.   Dian Situoloque una bolsa de hielo sobre las llagas durante 10 a 15 minutos para Acupuncturistaliviar el malestar.   Evite las comidas calientes, fras o saladas ya que pueden lastimar la boca. Consuma una dieta blanda para evitar la irritacin de las llagas. Use un sorbete si siente dolor al beber con un vaso.   Mantenga las llagas higienizadas y secas para evitar la infeccin en otros tejidos.   Evite el sol y evite las situaciones de estrs si es lo que  causa las llagas. Si el sol causa las llagas, aplique pantalla solar sobre los labios antes de salir al sol.  SOLICITE ATENCIN MDICA SI:   Tiene fiebre o sntomas persistentes durante ms de 2  3 das.   Tiene fiebre y los sntomas [address]empeoran.   Observa pus, un lquido que no es claro, en las llagas.   El enrojecimiento se expande.   Siente dolor o irritacin en el ojo.   Tiene llagas en los genitales.   Las llagas no se curan en 2 semanas.   Su sistema inmunolgico est debilitado.   Tiene frecuentes recurrencias del herpes labial.  ASEGRESE DE QUE:   Comprende estas instrucciones.  Controlar su enfermedad.  Solicitar ayuda de inmediato si no mejora o si empeora. Document Released: 01/04/2005 Document Revised: 09/29/2011 [facility]agesExitCare Patient Information 2015 StaplesExitCare, MarylandLLC. This information is not intended to replace advice given to you by your health care provider. Make sure you discuss any questions you have with your health care provider.  Lesin Dental (Dental Injury) Su examen muestra que usted ha sufrido una lesin en un diente. El tratamiento de un diente roto u otras lesiones dentarias depende de la gravedad de la lesin. Una lesin en un diente debe controlarse lo antes posible por un dentista en los siguientes casos:  Si el diente est flojo necesitar sujetarlo con alambre o rodearlo con una tablilla plstica para Bankermantenerlo en el lugar.  Un diente roto con exposicin de la pulpa que puede ocasionar una infeccin de gravedad.  Dolor en el diente, especialmente luego de morder o Product managermasticar.  Filo en el borde de los dientes que pude producir cortes en la lengua o en los labios. Le indicarn antibiticos o analgsicos para prevenir la infeccin y Human resources officercontrolar el dolor. Consuma una dieta blanda o lquida y enjuguese la boca con agua tibia despus de las comidas. Consulte con un dentista o regrese a este establecimiento si tiene ms inflamacin o dolor, o sufre  Chiropractoruna hemorragia en el lugar de la lesin que no Magazine features editorpuede controlar. SOLICITE ATENCIN MDICA SI:  El dolor aumenta y no se alivia con los medicamentos.  Hay hinchazn alrededor del diente, en la cara o en el cuello.  Comienza, contina o Control and instrumentation engineerempeora la hemorragia.  Tiene fiebre. Document Released: 01/04/2005 Document Revised: 03/29/2011 [facility] Bell[facility]Care Patient Information 2015 [place]ExitCare, [place]LLC. This information is not intended to replace advice given to you by your health care provider. Make sure you discuss any questions you have with your health care provider.

## 2013-11-19 ENCOUNTER — Ambulatory Visit: Payer: Self-pay | Attending: Internal Medicine | Admitting: Internal Medicine

## 2013-11-19 ENCOUNTER — Encounter: Payer: Self-pay | Admitting: Internal Medicine

## 2013-11-19 VITALS — BP 96/59 | HR 65 | Temp 98.2°F | Resp 16 | Ht 65.0 in | Wt 193.0 lb

## 2013-11-19 DIAGNOSIS — K088 Other specified disorders of teeth and supporting structures: Secondary | ICD-10-CM

## 2013-11-19 DIAGNOSIS — B009 Herpesviral infection, unspecified: Secondary | ICD-10-CM | POA: Insufficient documentation

## 2013-11-19 DIAGNOSIS — F17200 Nicotine dependence, unspecified, uncomplicated: Secondary | ICD-10-CM | POA: Insufficient documentation

## 2013-11-19 DIAGNOSIS — K0889 Other specified disorders of teeth and supporting structures: Secondary | ICD-10-CM

## 2013-11-19 DIAGNOSIS — B002 Herpesviral gingivostomatitis and pharyngotonsillitis: Secondary | ICD-10-CM

## 2013-11-19 DIAGNOSIS — K029 Dental caries, unspecified: Secondary | ICD-10-CM | POA: Insufficient documentation

## 2013-11-19 DIAGNOSIS — K047 Periapical abscess without sinus: Secondary | ICD-10-CM | POA: Insufficient documentation

## 2013-11-19 MED ORDER — TRAMADOL HCL 50 MG PO TABS: 50.0000 mg | ORAL_TABLET | Freq: Three times a day (TID) | ORAL | Status: DC | PRN

## 2013-11-19 MED ORDER — ACYCLOVIR 400 MG PO TABS: 400.0000 mg | ORAL_TABLET | Freq: Three times a day (TID) | ORAL | Status: DC

## 2013-11-19 NOTE — Progress Notes (Signed)
Establish Care Complaining of oral infection Upper area about 16 days had open gums with pus  Still with pain

## 2013-11-19 NOTE — Progress Notes (Signed)
Patient ID: Gabriel Ball, male   DOB: [birth year], 43 y.o.   MRN: [id number]  [id number]  [id number]  DOB - [birth year]  CC:  Chief Complaint  Patient presents with  . Establish Care       HPI: Gabriel Ball is a 43 y.o. male who presents to the clinic for dental pain and infection. Patient states that he was seen at a dental clinic a couple of weeks ago and had an area above his right frontal incisor which was opened up and sutured. Patient had the stiches removed 10 days later in the ED on 11/10/13.  He has also noticed a new HSV lesion on his tongue and was given a refill of acyclovir while in the ER a few days back.  Patient states that he has been taking ibuprofen for his oral pain, but has had little relief.  Patient reports that he has already had three upper teeth removed and was told that that was the only way to get rid of the infection.  The patient has concerns about the dentist removing all his teeth.  He is currently being seen by Leone BrandJavier Lazaro, DMD. He has currently been taking pencillin V for the infection.     No Known Allergies Past Medical History  Diagnosis Date  . Herpes   . Shingles    Current Outpatient Prescriptions on File Prior to Visit  Medication Sig Dispense Refill  . HYDROcodone-acetaminophen (NORCO/VICODIN) 5-325 MG per tablet Take 1-2 tablets by mouth every 6 (six) hours as needed for moderate pain or severe pain. 15 tablet 0  . acyclovir (ZOVIRAX) 400 MG tablet Take 1 tablet (400 mg total) by mouth 4 (four) times daily. 50 tablet 0   No current facility-administered medications on file prior to visit.   Family History  Problem Relation Age of Onset  . Cancer Father    History   Social History  . Marital Status: Single    Spouse Name: N/A    Number of Children: N/A  . Years of Education: N/A   Occupational History  . Not on file.   Social History Main Topics  . Smoking status: Current Some Day Smoker  . Smokeless tobacco: Not on  file  . Alcohol Use: Yes     Comment: rare  . Drug Use: No  . Sexual Activity: Not on file   Other Topics Concern  . Not on file   Social History Narrative    Review of Systems: Constitutional: Negative for fever, chills, diaphoresis, activity change, appetite change and fatigue. HENT: Negative for ear pain, nosebleeds, congestion, facial swelling, rhinorrhea, neck pain, neck stiffness and ear discharge.  Eyes: Negative for pain, discharge, redness, itching and visual disturbance. Respiratory: Negative for cough, choking, chest tightness, shortness of breath, wheezing and stridor.  Cardiovascular: Negative for chest pain, palpitations and leg swelling. Gastrointestinal: Negative for abdominal distention. Genitourinary: Negative for dysuria, urgency, frequency, hematuria, flank pain, decreased urine volume, difficulty urinating and dyspareunia.  Musculoskeletal: Negative for back pain, joint swelling, arthralgia and gait problem. Neurological: Negative for dizziness, tremors, seizures, syncope, facial asymmetry, speech difficulty, weakness, light-headedness, numbness and headaches.  Hematological: Negative for adenopathy. Does not bruise/bleed easily. Psychiatric/Behavioral: Negative for hallucinations, behavioral problems, confusion, dysphoric mood, decreased concentration and agitation.    Objective:   Filed Vitals:   11/19/13 1135  BP: 96/59  Pulse: 65  Temp: 98.2 F (36.8 C)  Resp: 16    Physical Exam  HENT:  Right Ear: External ear normal.  Left Ear: External ear normal.  Poor dentition with severe decay across upper front row  Eyes: Conjunctivae and EOM are normal. Pupils are equal, round, and reactive to light.  Cardiovascular: Normal rate, regular rhythm and normal heart sounds.   Pulmonary/Chest: Effort normal and breath sounds normal.     Lab Results  Component Value Date   WBC 7.3 06/18/2011   HGB 16.0 06/18/2011   HCT 44.5 06/18/2011   MCV 83.6  06/18/2011   PLT 238 06/18/2011   Lab Results  Component Value Date   CREATININE 0.88 06/18/2011   BUN 8 06/18/2011   NA 139 06/18/2011   K 3.7 06/18/2011   CL 103 06/18/2011   CO2 25 06/18/2011    No results found for: HGBA1C Lipid Panel  No results found for: CHOL, TRIG, HDL, CHOLHDL, VLDL, LDLCALC     Assessment and plan:   Donald PoreJorge was seen today for establish care.  Diagnoses and associated orders for this visit:  Recurrent oral herpes simplex - Refill acyclovir (ZOVIRAX) 400 MG tablet; Take 1 tablet (400 mg total) by mouth 3 (three) times daily.  Pain, dental - traMADol (ULTRAM) 50 MG tablet; Take 1 tablet (50 mg total) by mouth every 8 (eight) hours as needed. Gave patient list of low cost dental options if he chooses to seek a second opinion.     Return if symptoms worsen or fail to improve.   Due to language barrier, an interpreter was present during the history-taking and subsequent discussion (and for part of the physical exam) with this patient.   The patient was given clear instructions to go to ER or return to medical center if symptoms don't improve, worsen or new problems develop. The patient verbalized understanding.   Holland CommonsKECK, Lakea Mittelman, NP-C [facility] [phone] 11/19/2013, 12:07 PM

## 2013-11-19 NOTE — Patient Instructions (Signed)
Smoking Cessation Quitting smoking is important to your health and has many advantages. However, it is not always easy to quit since nicotine is a very addictive drug. Oftentimes, people try 3 times or more before being able to quit. This document explains the best ways for you to prepare to quit smoking. Quitting takes hard work and a lot of effort, but you can do it. ADVANTAGES OF QUITTING SMOKING  You will live longer, feel better, and live better.  Your body will feel the impact of quitting smoking almost immediately.  Within 20 minutes, blood pressure decreases. Your pulse returns to its normal level.  After 8 hours, carbon monoxide levels in the blood return to normal. Your oxygen level increases.  After 24 hours, the chance of having a heart attack starts to decrease. Your breath, hair, and body stop smelling like smoke.  After 48 hours, damaged nerve endings begin to recover. Your sense of taste and smell improve.  After 72 hours, the body is virtually free of nicotine. Your bronchial tubes relax and breathing becomes easier.  After 2 to 12 weeks, lungs can hold more air. Exercise becomes easier and circulation improves.  The risk of having a heart attack, stroke, cancer, or lung disease is greatly reduced.  After 1 year, the risk of coronary heart disease is cut in half.  After 5 years, the risk of stroke falls to the same as a nonsmoker.  After 10 years, the risk of lung cancer is cut in half and the risk of other cancers decreases significantly.  After 15 years, the risk of coronary heart disease drops, usually to the level of a nonsmoker.  If you are pregnant, quitting smoking will improve your chances of having a healthy baby.  The people you live with, especially any children, will be healthier.  You will have extra money to spend on things other than cigarettes. QUESTIONS TO THINK ABOUT BEFORE ATTEMPTING TO QUIT You may want to talk about your answers with your  health care provider.  Why do you want to quit?  If you tried to quit in the past, what helped and what did not?  What will be the most difficult situations for you after you quit? How will you plan to handle them?  Who can help you through the tough times? Your family? Friends? A health care provider?  What pleasures do you get from smoking? What ways can you still get pleasure if you quit? Here are some questions to ask your health care provider:  How can you help me to be successful at quitting?  What medicine do you think would be best for me and how should I take it?  What should I do if I need more help?  What is smoking withdrawal like? How can I get information on withdrawal? GET READY  Set a quit date.  Change your environment by getting rid of all cigarettes, ashtrays, matches, and lighters in your home, car, or work. Do not let people smoke in your home.  Review your past attempts to quit. Think about what worked and what did not. GET SUPPORT AND ENCOURAGEMENT You have a better chance of being successful if you have help. You can get support in many ways.  Tell your family, friends, and coworkers that you are going to quit and need their support. Ask them not to smoke around you.  Get individual, group, or telephone counseling and support. Programs are available at local hospitals and health centers. Call   your local health department for information about programs in your area.  Spiritual beliefs and practices may help some smokers quit.  Download a "quit meter" on your computer to keep track of quit statistics, such as how long you have gone without smoking, cigarettes not smoked, and money saved.  Get a self-help book about quitting smoking and staying off tobacco. LEARN NEW SKILLS AND BEHAVIORS  Distract yourself from urges to smoke. Talk to someone, go for a walk, or occupy your time with a task.  Change your normal routine. Take a different route to work.  Drink tea instead of coffee. Eat breakfast in a different place.  Reduce your stress. Take a hot bath, exercise, or read a book.  Plan something enjoyable to do every day. Reward yourself for not smoking.  Explore interactive web-based programs that specialize in helping you quit. GET MEDICINE AND USE IT CORRECTLY Medicines can help you stop smoking and decrease the urge to smoke. Combining medicine with the above behavioral methods and support can greatly increase your chances of successfully quitting smoking.  Nicotine replacement therapy helps deliver nicotine to your body without the negative effects and risks of smoking. Nicotine replacement therapy includes nicotine gum, lozenges, inhalers, nasal sprays, and skin patches. Some may be available over-the-counter and others require a prescription.  Antidepressant medicine helps people abstain from smoking, but how this works is unknown. This medicine is available by prescription.  Nicotinic receptor partial agonist medicine simulates the effect of nicotine in your brain. This medicine is available by prescription. Ask your health care provider for advice about which medicines to use and how to use them based on your health history. Your health care provider will tell you what side effects to look out for if you choose to be on a medicine or therapy. Carefully read the information on the package. Do not use any other product containing nicotine while using a nicotine replacement product.  RELAPSE OR DIFFICULT SITUATIONS Most relapses occur within the first 3 months after quitting. Do not be discouraged if you start smoking again. Remember, most people try several times before finally quitting. You may have symptoms of withdrawal because your body is used to nicotine. You may crave cigarettes, be irritable, feel very hungry, cough often, get headaches, or have difficulty concentrating. The withdrawal symptoms are only temporary. They are strongest  when you first quit, but they will go away within 10-14 days. To reduce the chances of relapse, try to:  Avoid drinking alcohol. Drinking lowers your chances of successfully quitting.  Reduce the amount of caffeine you consume. Once you quit smoking, the amount of caffeine in your body increases and can give you symptoms, such as a rapid heartbeat, sweating, and anxiety.  Avoid smokers because they can make you want to smoke.  Do not let weight gain distract you. Many smokers will gain weight when they quit, usually less than 10 pounds. Eat a healthy diet and stay active. You can always lose the weight gained after you quit.  Find ways to improve your mood other than smoking. FOR MORE INFORMATION  www.smokefree.gov  Document Released: 12/29/2000 Document Revised: 05/21/2013 Document Reviewed: 04/15/2011 ExitCare Patient Information 2015 [employer]. This information is not intended to replace advice given to you by your health care provider. Make sure you discuss any questions you have with your health care provider.  

## 2013-12-01 ENCOUNTER — Encounter (HOSPITAL_COMMUNITY): Payer: Self-pay | Admitting: Nurse Practitioner

## 2013-12-01 ENCOUNTER — Emergency Department (HOSPITAL_COMMUNITY): Payer: Self-pay

## 2013-12-01 ENCOUNTER — Emergency Department (HOSPITAL_COMMUNITY)
Admission: EM | Admit: 2013-12-01 | Discharge: 2013-12-01 | Disposition: A | Discharge status: Home / Self Care | Payer: Self-pay | Attending: Emergency Medicine | Admitting: Emergency Medicine

## 2013-12-01 DIAGNOSIS — Z792 Long term (current) use of antibiotics: Secondary | ICD-10-CM | POA: Insufficient documentation

## 2013-12-01 DIAGNOSIS — H5711 Ocular pain, right eye: Secondary | ICD-10-CM | POA: Insufficient documentation

## 2013-12-01 DIAGNOSIS — Z720 Tobacco use: Secondary | ICD-10-CM | POA: Insufficient documentation

## 2013-12-01 DIAGNOSIS — B009 Herpesviral infection, unspecified: Secondary | ICD-10-CM | POA: Insufficient documentation

## 2013-12-01 DIAGNOSIS — H578 Other specified disorders of eye and adnexa: Secondary | ICD-10-CM | POA: Insufficient documentation

## 2013-12-01 DIAGNOSIS — R519 Headache, unspecified: Secondary | ICD-10-CM

## 2013-12-01 DIAGNOSIS — R51 Headache: Secondary | ICD-10-CM | POA: Insufficient documentation

## 2013-12-01 DIAGNOSIS — M792 Neuralgia and neuritis, unspecified: Secondary | ICD-10-CM | POA: Insufficient documentation

## 2013-12-01 LAB — RAPID URINE DRUG SCREEN, HOSP PERFORMED
Amphetamines: NOT DETECTED
BARBITURATES: NOT DETECTED
BENZODIAZEPINES: NOT DETECTED
Cocaine: NOT DETECTED
Opiates: NOT DETECTED
Tetrahydrocannabinol: NOT DETECTED

## 2013-12-01 LAB — CBC
HCT: 43.1 % (ref 39.0–52.0)
Hemoglobin: 15.4 g/dL (ref 13.0–17.0)
MCH: 29.2 pg (ref 26.0–34.0)
MCHC: 35.7 g/dL (ref 30.0–36.0)
MCV: 81.8 fL (ref 78.0–100.0)
Platelets: 238 10*3/uL (ref 150–400)
RBC: 5.27 MIL/uL (ref 4.22–5.81)
RDW: 13.3 % (ref 11.5–15.5)
WBC: 9.8 10*3/uL (ref 4.0–10.5)

## 2013-12-01 LAB — URINALYSIS, ROUTINE W REFLEX MICROSCOPIC
Bilirubin Urine: NEGATIVE
GLUCOSE, UA: NEGATIVE mg/dL
Hgb urine dipstick: NEGATIVE
KETONES UR: NEGATIVE mg/dL
Leukocytes, UA: NEGATIVE
Nitrite: NEGATIVE
PH: 5 (ref 5.0–8.0)
Protein, ur: NEGATIVE mg/dL
Specific Gravity, Urine: 1.023 (ref 1.005–1.030)
Urobilinogen, UA: 0.2 mg/dL (ref 0.0–1.0)

## 2013-12-01 LAB — PROTIME-INR
INR: 1.03 (ref 0.00–1.49)
PROTHROMBIN TIME: 13.6 s (ref 11.6–15.2)

## 2013-12-01 LAB — COMPREHENSIVE METABOLIC PANEL
ALT: 24 U/L (ref 0–53)
ANION GAP: 12 (ref 5–15)
AST: 18 U/L (ref 0–37)
Albumin: 4.1 g/dL (ref 3.5–5.2)
Alkaline Phosphatase: 79 U/L (ref 39–117)
BUN: 11 mg/dL (ref 6–23)
CALCIUM: 9.2 mg/dL (ref 8.4–10.5)
CO2: 25 mEq/L (ref 19–32)
Chloride: 101 mEq/L (ref 96–112)
Creatinine, Ser: 0.7 mg/dL (ref 0.50–1.35)
GFR calc non Af Amer: >90 mL/min (ref >90)
GLUCOSE: 84 mg/dL (ref 70–99)
Potassium: 3.8 mEq/L (ref 3.7–5.3)
Sodium: 138 mEq/L (ref 137–147)
TOTAL PROTEIN: 7.2 g/dL (ref 6.0–8.3)
Total Bilirubin: 0.3 mg/dL (ref 0.3–1.2)

## 2013-12-01 LAB — APTT: aPTT: 44 seconds — ABNORMAL HIGH (ref 24–37)

## 2013-12-01 LAB — DIFFERENTIAL
Basophils Absolute: 0 10*3/uL (ref 0.0–0.1)
Basophils Relative: 0 % (ref 0–1)
EOS ABS: 0.3 10*3/uL (ref 0.0–0.7)
Eosinophils Relative: 3 % (ref 0–5)
LYMPHS ABS: 2.3 10*3/uL (ref 0.7–4.0)
Lymphocytes Relative: 24 % (ref 12–46)
Monocytes Absolute: 0.5 10*3/uL (ref 0.1–1.0)
Monocytes Relative: 5 % (ref 3–12)
Neutro Abs: 6.7 10*3/uL (ref 1.7–7.7)
Neutrophils Relative %: 68 % (ref 43–77)

## 2013-12-01 LAB — I-STAT TROPONIN, ED: TROPONIN I, POC: 0.01 ng/mL (ref 0.00–0.08)

## 2013-12-01 LAB — ETHANOL

## 2013-12-01 MED ORDER — FLUORESCEIN SODIUM 1 MG OP STRP
1.0000 | ORAL_STRIP | Freq: Once | OPHTHALMIC | Status: AC
  Administered 2013-12-01: 1 via OPHTHALMIC
  Filled 2013-12-01: qty 1

## 2013-12-01 MED ORDER — LORAZEPAM 1 MG PO TABS
1.0000 mg | ORAL_TABLET | Freq: Once | ORAL | Status: AC
  Administered 2013-12-01: 1 mg via ORAL
  Filled 2013-12-01: qty 1
  Filled 2013-12-01: qty 2

## 2013-12-01 MED ORDER — TETRACAINE HCL 0.5 % OP SOLN
1.0000 [drp] | Freq: Once | OPHTHALMIC | Status: AC
  Administered 2013-12-01: 1 [drp] via OPHTHALMIC
  Filled 2013-12-01: qty 2

## 2013-12-01 MED ORDER — TRAMADOL HCL 50 MG PO TABS: 50.0000 mg | ORAL_TABLET | Freq: Four times a day (QID) | ORAL | Status: DC | PRN

## 2013-12-01 MED ORDER — LORAZEPAM 1 MG PO TABS: 1.0000 mg | ORAL_TABLET | Freq: Four times a day (QID) | ORAL | Status: DC | PRN

## 2013-12-01 NOTE — ED Notes (Signed)
Right eye 20/50. Left eye pt could not see at all. Both eyes 20/50.

## 2013-12-01 NOTE — ED Notes (Signed)
He c/o R eye pain since yesterday. He denies any injuries to the eye. He states it feels dry and his vision seems blurry from this eye

## 2013-12-01 NOTE — ED Notes (Signed)
Info via language line.  Onset 15 years ago pt had similar thing to happen to left side of face (tingling, left eye drooping and left side mouth drooping).  Pt states he still has left eyelid drooping. Pt described it as "paralysis of left side of face".  Pt was seen by doctor but can't remember what the cause was.  Onset yesterday after pt got really mad right side of face, eye got tingly, right eye feels dry, sleepy and tired.  Right jaw feels like it is tingling.  PA made aware.  D/c Ativan, orders obtained.

## 2013-12-01 NOTE — Discharge Instructions (Signed)
°Emergency Department Resource Guide °1) Find a Doctor and Pay Out of Pocket °Although you won't have to find out who is covered by your insurance plan, it is a good idea to ask around and get recommendations. You will then need to call the office and see if the doctor you have chosen will accept you as a new patient and what types of options they offer for patients who are self-pay. Some doctors offer discounts or will set up payment plans for their patients who do not have insurance, but you will need to ask so you aren't surprised when you get to your appointment. ° °2) Contact Your Local Health Department °Not all health departments have doctors that can see patients for sick visits, but many do, so it is worth a call to see if yours does. If you don't know where your local health department is, you can check in your phone book. The CDC also has a tool to help you locate your state's health department, and many state websites also have listings of all of their local health departments. ° °3) Find a Walk-in Clinic °If your illness is not likely to be very severe or complicated, you may want to try a walk in clinic. These are popping up all over the country in pharmacies, drugstores, and shopping centers. They're usually staffed by nurse practitioners or physician assistants that have been trained to treat common illnesses and complaints. They're usually fairly quick and inexpensive. However, if you have serious medical issues or chronic medical problems, these are probably not your best option. ° °No Primary Care Doctor: °- Call [facility] at  832-8000 - they can help you locate a primary care doctor that  accepts your insurance, provides certain services, etc. °- Physician Referral Service- 1-800-533-3463 ° °Chronic Pain Problems: °Organization         Address  Phone   Notes  °[facility]  ([phone]) [phone] Patients need to be referred by their primary care doctor.  ° °Medication  Assistance: °Organization         Address  Phone   Notes  °[place] [facility] [address] °[place], [place] [address] (336) 641-8030 --Must be a resident of [place] °-- Must have NO insurance coverage whatsoever (no Medicaid/ Medicare, etc.) °-- The pt. MUST have a primary care doctor that directs their care regularly and follows them in the community °  °[facility]  (866) 331-1348   °[employer]  (888) 892-1162   ° °Agencies that provide inexpensive medical care: °Organization         Address  Phone   Notes  °[facility]  (336) 832-8035   °[facility]    (336) 832-7272   °[facility] [address] °[place], [place] [address] (336) 832-4777   °[facility] [address], °[place] (336) 271-4999   °[facility]    (336) 373-0678   °[facility]    (336) 272-1050   °[facility] ° [address], [place] Phone:  (336) 832-4444, Fax:  (336) 832-4440 Hours of Operation:  9 am - 6 pm, M-F.  Also accepts Medicaid/Medicare and self-pay.  °[facility] ° [address], [place] Phone: (336) [phone], Fax: (336) [phone]. Hours of Operation:  8:30 am - 5:30 pm, M-F.  Also accepts Medicaid and self-pay.  °[facility] [place] [address], [place] Phone: (336) 878-6027   °[facility] [address], [place], [place] (336)723-1848, Ext. 123 Mondays & Thursdays: 7-9 AM.  First 15 patients are seen on a first come, first serve basis. °  ° °Medicaid-accepting [place] Providers: ° °Organization         Address  Phone   Notes  °[facility] [address], [place] (336) 641-2100 Also accepts self-pay patients.  °[facility] [address], [place] ° (336) 856-9996   °[facility] [address], [place]  (336) 288-8857   °[facility] [address] [place] [address], [place] (336) 299-7000   °Veita Bland [address], [place]  ° (336) [phone] Only accepts [place] Access Medicaid patients after they have their name applied to their card.  ° °Self-Pay (no insurance) in [place]: ° °Organization         Address  Phone   Notes  °Sickle Cell Patients, [facility] [address], [place] (336) 832-1970   °[facility] [address], [place] (336) 832-4400   °[facility] ° [address], [place] (336) 992-4800   °[facility]/Dr. Osei-Bonsu ° [address] [place] [address], [place] or [address], [place] (336) [phone] Phone number for both [place] and [place] locations is the same.  °[facility] [address], [place] (336) 299-0000   °[facility] [address] [place] [address], [place] or [address] (336) 852-7530 °(336) 878-2260   °[facility] [address], [place] (336) 350-1642, phone; (336) 294-5005, fax Sees patients 1st and 3rd Saturday of every month.  Must not qualify for public or private insurance (i.e. Medicaid, Medicare, [place] Health Choice, Veterans' Benefits) • Household income should be no more than 200% of the poverty level •The clinic cannot treat you if you are pregnant or think you are pregnant • Sexually transmitted diseases are not treated at the clinic.  ° ° °Dental Care: °Organization         Address  Phone  Notes  °[place] [facility] [address], [place] (336) 641-6152 Accepts children up to age 21 who are enrolled in Medicaid or [place] Health Choice; pregnant women with a Medicaid card; and children who have applied for Medicaid or [place] Health Choice, but were declined, whose parents can pay a reduced fee at time of service.  °[place]  [facility] [place]  [address], [place] (336) 641-7733 Accepts children up to age 21 who are enrolled in Medicaid or [place] Health Choice; pregnant women with a Medicaid card; and children who have applied for Medicaid or [place] Health Choice, but were declined, whose parents can pay a reduced fee at time of service.  °[facility] ° [address], [place] (336) 641-4533 Patients are seen by appointment only. Walk-ins are not accepted. [facility] will see patients 18 years of age and older. °Monday - Tuesday (8am-5pm) °Most Wednesdays (8:30-5pm) °$30 per visit, cash only  °[facility] ° [address], [place] (336) [phone] Patients are seen by appointment only. Walk-ins are not accepted. [facility] will see patients 18 years of age and older. °One   Wednesday Evening (Monthly: Volunteer Based).  $30 per visit, cash only  °[facility]  (919) 537-3737 for adults; Children under age 4, call Graduate Pediatric Dentistry at ([phone]) [phone]. Children aged 4-14, please call ([phone]) [phone] to request a pediatric application. ° Dental services are provided in all areas of dental care including fillings, crowns and bridges, complete and partial dentures, implants, gum treatment, root canals, and extractions. Preventive care is also provided. Treatment is provided to both adults and children. °Patients are selected via a lottery and there is often a waiting list. °  °[facility] [address], °[place] ° (336) 763-8833 [email or web address] °  °[facility] [address], [place], [place] (336)723-1848, Ext. 123 Second and Fourth Thursday of each month, opens at 6:30 AM; Clinic ends at 9 AM.  Patients are seen on a first-come first-served basis, and a limited number are seen during each clinic.  ° °[facility] ° [address], [place], [place] (336) [phone]    Eligibility Requirements °You must have lived in [place], [place], or [place] counties for at least the last three months. °  You cannot be eligible for state or federal sponsored healthcare insurance, including Veterans Administration, Medicaid, or Medicare. °  You generally cannot be eligible for healthcare insurance through your employer.  °  How to apply: °Eligibility screenings are held every Tuesday and Wednesday afternoon from 1:00 pm until 4:00 pm. You do not need an appointment for the interview!  °[facility] [address], [place], [place] 336-631-2330   °[facility]  336-342-8273   °[facility]  336-703-3100   °[facility]  336-570-6415   ° °Behavioral Health Resources in the Community: °Intensive Outpatient Programs °Organization         Address  Phone  Notes  °[facility] [address], [place], [place] 336-878-6098   °[facility] [address], [place], [place] 336-832-9800   °[facility] [address], [place], [place] ° 336-882-2125   °[facility] [address],  °[place], [place] 1-800-853-5163 or 336-641-4981   °Substance Abuse Resources °Organization         Address  Phone  Notes  °[facility]  336-882-2125   °[facility]  336-784-9470   °[facility]  336-285-9073   °[facility]  336-845-3988   °Residential & Outpatient Substance Abuse Program  1-800-659-3381   °Psychological Services °Organization         Address  Phone  Notes  °[facility]  336- 832-9600   °[facility]  336- 378-7881   °[facility] [address], [place] 1-800-853-5163 or 336-641-4981   ° °Mobile Crisis Teams °Organization         Address  Phone  Notes  °[facility], Mobile Crisis Care Unit  1-877-626-1772   °Assertive °[facility] ° [address]  [place], [place] 336-834-9664   °Sharon DeEsch [address] °[place] 336-554-5454   ° °Self-Help/Support Groups °Organization         Address  Phone             Notes  °[facility] [place] - variety of support groups  336- 373-1402 Call for more information  °Narcotics Anonymous (NA), [facility] [address], °[place]  2 meetings at this location  ° °  Residential Treatment Programs °Organization         Address  Phone  Notes  °[facility] [address],    °[place]  1-866-801-8205   °[facility] ° [address], [place], [place] 704-293-8524   °[facility] [address], [place] 336-845-3988 Admissions: 8am-3pm M-F  °[facility] [address]    °[place], [place] 336-841-1104   °[facility] [address], [place], [place] 336-379-7146   °[facility] [address]  °[place], [place] 336-285-9073   °[facility] Intensive Outpatient [address], [place], [place] 336-852-3033   °[facility] [address]  °[place], [place] 1-877-615-2722 or 336-784-9470   °[facility] [address] [place], [place] 336-227-7417 Accepts Medicaid  °[facility] [address]  ° [place] 1-800-659-3381 Substance Abuse/Addiction Treatment  ° °[place] Behavioral Health Resources °Organization         Address  Phone  Notes  °[facility]  (888) 581-9988   °Julie Brannon, PhD [address] [place], [place]   (336) 349-5553 or (336) 951-0000   °[facility]   [address] °[place], [place] (336) 349-4454   °[facility] [address], [place], [place] (336) 342-8316 Insurance/Medicaid/sponsorship through [facility]  °[facility] [address]                                    [place], [place] (336) 342-8316 Therapy/tele-psych/case    °[facility] [address]  ° [place], [place] (336) 349-2233    °Dr. Arfeen  (336) 349-4544   °[facility] [place]  [employer] [place] [facility] 1) [address], [place] °2) [address], [place] °3)  [address], [place] (336) 349-3220 °(336) 342-7768 ° °(336) 342-8140   °[place] [facility] (336) 342-1394 or (336) 342-3537 (After Hours)    ° ° °

## 2013-12-01 NOTE — ED Notes (Signed)
Report given to Stephen RN.

## 2013-12-01 NOTE — ED Notes (Signed)
Patient transported to CT 

## 2013-12-01 NOTE — ED Provider Notes (Signed)
CSN: [id number]     Arrival date & time 12/01/13  1113 History  This chart was scribed for Junious SilkHannah Aeson Sawyers, PA-C working with Vanetta MuldersScott Zackowski, MD by Angelene GiovanniEmmanuella Mensah, ED Scribe. The patient was seen in room TR04C/TR04C and the patient's care was started at 12:34 PM    Chief Complaint  Patient presents with  . Eye Pain   The history is provided by the patient and a relative. The history is limited by a language barrier. A language interpreter was used Multimedia programmer([employer]).   HPI Comments: Gabriel Ball is a 43 y.o. male who presents to the Emergency Department complaining of intermittent burning and tearing of his eyes onset yesterday in the afternoon. His symptoms began after getting very angry. He denies any fever, chills, or N/V. He reports associated blurriness and sensitivity to light and nerve pain in his face around the eyes. He states that it hurts when he blinks and it feels better when his eyes are closed. He denies wearing glasses or contacts and has not seen an eye doctor. This happened to him once before approximately 15 years ago. He reports he is unsure of what the results of his test were, but his symptoms resolved and he has not had additional issues.     Past Medical History  Diagnosis Date  . Herpes   . Shingles    History reviewed. No pertinent past surgical history. Family History  Problem Relation Age of Onset  . Cancer Father    History  Substance Use Topics  . Smoking status: Current Some Day Smoker  . Smokeless tobacco: Not on file  . Alcohol Use: Yes     Comment: rare    Review of Systems  Constitutional: Negative for fever and chills.  Eyes: Positive for pain and discharge (tearing).  Respiratory: Negative for cough.   Gastrointestinal: Negative for nausea and vomiting.  All other systems reviewed and are negative.     Allergies  Review of patient's allergies indicates no known allergies.  Home Medications   Prior to Admission medications    Medication Sig Start Date End Date Taking? Authorizing Provider  acyclovir (ZOVIRAX) 400 MG tablet Take 1 tablet (400 mg total) by mouth 3 (three) times daily. 11/19/13   Ambrose FinlandValerie A Keck, NP  HYDROcodone-acetaminophen (NORCO/VICODIN) 5-325 MG per tablet Take 1-2 tablets by mouth every 6 (six) hours as needed for moderate pain or severe pain. 11/10/13   Monte FantasiaJoseph W Mintz, PA-C  penicillin v potassium (VEETID) 500 MG tablet Take 500 mg by mouth 4 (four) times daily.    Historical Provider, MD  traMADol (ULTRAM) 50 MG tablet Take 1 tablet (50 mg total) by mouth every 8 (eight) hours as needed. 11/19/13   Ambrose FinlandValerie A Keck, NP   BP 113/62 mmHg  Pulse 72  Temp(Src) 98.2 F (36.8 C) (Oral)  Resp 18  Ht 5\' 8"  (1.727 m)  Wt 190 lb (86.183 kg)  BMI 28.90 kg/m2  SpO2 95% Physical Exam  Constitutional: He is oriented to person, place, and time. He appears well-developed and well-nourished. No distress.  HENT:  Head: Normocephalic and atraumatic.  Right Ear: External ear normal.  Left Ear: External ear normal.  Nose: Nose normal.  Normal sensation to face No facial droop No temporal artery tenderness  Eyes: Conjunctivae, EOM and lids are normal. Pupils are equal, round, and reactive to light.  Slit lamp exam:      The right eye shows no corneal abrasion, no corneal flare, no corneal ulcer,  no foreign body, no hyphema, no hypopyon and no fluorescein uptake.  Neck: Normal range of motion. No tracheal deviation present.  No nuchal rigidity or meningeal signs  Cardiovascular: Normal rate, regular rhythm, normal heart sounds, intact distal pulses and normal pulses.   Pulmonary/Chest: Effort normal and breath sounds normal. No stridor.  Abdominal: Soft. He exhibits no distension. There is no tenderness.  Musculoskeletal: Normal range of motion.  Neurological: He is alert and oriented to person, place, and time. He has normal strength. No sensory deficit. He exhibits normal muscle tone. Coordination and gait  normal. GCS eye subscore is 4. GCS verbal subscore is 5. GCS motor subscore is 6.  Skin: Skin is warm and dry. He is not diaphoretic.  Psychiatric: He has a normal mood and affect. His behavior is normal.  Nursing note and vitals reviewed.   ED Course  Procedures (including critical care time) DIAGNOSTIC STUDIES: Oxygen Saturation is 95% on RA, adequate by my interpretation.    COORDINATION OF CARE: 12:38 PM- Pt advised of plan for treatment and pt agrees.    Labs Review Labs Reviewed  APTT - Abnormal; Notable for the following:    aPTT 44 (*)    All other components within normal limits  ETHANOL  PROTIME-INR  CBC  DIFFERENTIAL  COMPREHENSIVE METABOLIC PANEL  URINE RAPID DRUG SCREEN (HOSP PERFORMED)  URINALYSIS, ROUTINE W REFLEX MICROSCOPIC  I-STAT TROPOININ, ED  I-STAT TROPOININ, ED    Imaging Review Ct Head Wo Contrast  12/01/2013   CLINICAL DATA:  Headache behind right eye  since yesterday  EXAM: CT HEAD WITHOUT CONTRAST  TECHNIQUE: Contiguous axial images were obtained from the base of the skull through the vertex without intravenous contrast.  COMPARISON:  None.  FINDINGS: No skull fracture is noted. Paranasal sinuses and mastoid air cells are unremarkable.  No acute cortical infarction. No hydrocephalus. No intracranial hemorrhage, mass effect or midline shift. No intra or extra-axial fluid collection. The gray and white-matter differentiation is preserved. No mass lesion is noted on this unenhanced scan.  IMPRESSION: No acute intracranial abnormality.   Electronically Signed   By: Natasha MeadLiviu  Pop M.D.   On: 12/01/2013 14:47     EKG Interpretation None      MDM   Final diagnoses:  Headache  Eye pain, right    Patient presents emergency department for evaluation of "nerve pain" and eye pain. He sometimes reports right eye feels droopy. Fluorescein exam of eye is unremarkable. Patient feels improved after Ativan. Labs and CT are negative for acute pathology. I have some  suspicion for at the beginning of shingles. Discussed with patient that he must return to the emergency department if he develops a rash. Vital signs stable for discharge. Dr. Deretha EmoryZackowski evaluated patient and agrees with plan.Patient / Family / Caregiver informed of clinical course, understand medical decision-making process, and agree with plan.   I personally performed the services described in this documentation, which was scribed in my presence. The recorded information has been reviewed and is accurate.    Mora BellmanHannah S Kin Galbraith, PA-C 12/01/13 1700

## 2013-12-01 NOTE — ED Provider Notes (Addendum)
Medical screening examination/treatment/procedure(s) were conducted as a shared visit with non-physician practitioner(s) and myself.  I personally evaluated the patient during the encounter.   EKG Interpretation None        EKG Interpretation None       Date: 12/01/2013  Rate: 70  Rhythm: normal sinus rhythm  QRS Axis: normal  Intervals: normal  ST/T Wave abnormalities: normal  Conduction Disutrbances:none  Narrative Interpretation:   Old EKG Reviewed: none available    Results for orders placed or performed during the hospital encounter of 12/01/13  Ethanol  Result Value Ref Range   Alcohol, Ethyl (B) <11 0 - 11 mg/dL  Protime-INR  Result Value Ref Range   Prothrombin Time 13.6 11.6 - 15.2 seconds   INR 1.03 0.00 - 1.49  APTT  Result Value Ref Range   aPTT 44 (H) 24 - 37 seconds  CBC  Result Value Ref Range   WBC 9.8 4.0 - 10.5 K/uL   RBC 5.27 4.22 - 5.81 MIL/uL   Hemoglobin 15.4 13.0 - 17.0 g/dL   HCT 16.143.1 09.639.0 - 04.552.0 %   MCV 81.8 78.0 - 100.0 fL   MCH 29.2 26.0 - 34.0 pg   MCHC 35.7 30.0 - 36.0 g/dL   RDW 40.913.3 81.111.5 - 91.415.5 %   Platelets 238 150 - 400 K/uL  Differential  Result Value Ref Range   Neutrophils Relative % 68 43 - 77 %   Neutro Abs 6.7 1.7 - 7.7 K/uL   Lymphocytes Relative 24 12 - 46 %   Lymphs Abs 2.3 0.7 - 4.0 K/uL   Monocytes Relative 5 3 - 12 %   Monocytes Absolute 0.5 0.1 - 1.0 K/uL   Eosinophils Relative 3 0 - 5 %   Eosinophils Absolute 0.3 0.0 - 0.7 K/uL   Basophils Relative 0 0 - 1 %   Basophils Absolute 0.0 0.0 - 0.1 K/uL  Comprehensive metabolic panel  Result Value Ref Range   Sodium 138 137 - 147 mEq/L   Potassium 3.8 3.7 - 5.3 mEq/L   Chloride 101 96 - 112 mEq/L   CO2 25 19 - 32 mEq/L   Glucose, Bld 84 70 - 99 mg/dL   BUN 11 6 - 23 mg/dL   Creatinine, Ser 7.820.70 0.50 - 1.35 mg/dL   Calcium 9.2 8.4 - 95.610.5 mg/dL   Total Protein 7.2 6.0 - 8.3 g/dL   Albumin 4.1 3.5 - 5.2 g/dL   AST 18 0 - 37 U/L   ALT 24 0 - 53 U/L   Alkaline  Phosphatase 79 39 - 117 U/L   Total Bilirubin 0.3 0.3 - 1.2 mg/dL   GFR calc non Af Amer >90 >90 mL/min   GFR calc Af Amer >90 >90 mL/min   Anion gap 12 5 - 15  Urine Drug Screen  Result Value Ref Range   Opiates NONE DETECTED NONE DETECTED   Cocaine NONE DETECTED NONE DETECTED   Benzodiazepines NONE DETECTED NONE DETECTED   Amphetamines NONE DETECTED NONE DETECTED   Tetrahydrocannabinol NONE DETECTED NONE DETECTED   Barbiturates NONE DETECTED NONE DETECTED  Urinalysis, Routine w reflex microscopic  Result Value Ref Range   Color, Urine YELLOW YELLOW   APPearance CLEAR CLEAR   Specific Gravity, Urine 1.023 1.005 - 1.030   pH 5.0 5.0 - 8.0   Glucose, UA NEGATIVE NEGATIVE mg/dL   Hgb urine dipstick NEGATIVE NEGATIVE   Bilirubin Urine NEGATIVE NEGATIVE   Ketones, ur NEGATIVE NEGATIVE mg/dL   Protein,  ur NEGATIVE NEGATIVE mg/dL   Urobilinogen, UA 0.2 0.0 - 1.0 mg/dL   Nitrite NEGATIVE NEGATIVE   Leukocytes, UA NEGATIVE NEGATIVE  I-Stat Troponin, ED [facility])  Result Value Ref Range   Troponin i, poc 0.01 0.00 - 0.08 ng/mL   Comment 3           Ct Head Wo Contrast  12/01/2013   CLINICAL DATA:  Headache behind right eye  since yesterday  EXAM: CT HEAD WITHOUT CONTRAST  TECHNIQUE: Contiguous axial images were obtained from the base of the skull through the vertex without intravenous contrast.  COMPARISON:  None.  FINDINGS: No skull fracture is noted. Paranasal sinuses and mastoid air cells are unremarkable.  No acute cortical infarction. No hydrocephalus. No intracranial hemorrhage, mass effect or midline shift. No intra or extra-axial fluid collection. The gray and white-matter differentiation is preserved. No mass lesion is noted on this unenhanced scan.  IMPRESSION: No acute intracranial abnormality.   Electronically Signed   By: Natasha MeadLiviu  Pop M.D.   On: 12/01/2013 14:47    Patient seen by me. Interpreters were used as well as the son was used. Some difficulty with communication with  the patient. Patient seemed to me to be complaining of pain and discomfort around his right eye and the right side of his face. For the past 2 days. I exam done by the mid-level showed no evidence of any of ocular abnormalities. Head CT was negative. Labs without significant abnormalities. Patient does not have a rash clinically was concerned about developing herpes zoster. However no clinical evidence at this time. Patient given precautions. Patient felt it was due to emotional stress. Not sure that that's related. Symptoms being of unilateral very well could be developing zoster rash if it occurs will determine the diagnosis. No evidence of Bell's palsy at this time as well. Patient is a nontoxic no acute distress cleared for discharge. Precautions provided.    Vanetta MuldersScott Shivaay Stormont, MD 12/01/13 1350  Vanetta MuldersScott [place] Giammona, MD 12/02/13 0800  Vanetta MuldersScott Temperence Zenor, MD 12/02/13 [phone]

## 2014-01-21 ENCOUNTER — Encounter: Payer: Self-pay | Admitting: Internal Medicine

## 2014-01-21 ENCOUNTER — Ambulatory Visit: Payer: Self-pay | Attending: Internal Medicine | Admitting: Internal Medicine

## 2014-01-21 VITALS — BP 108/66 | HR 74 | Temp 98.5°F | Resp 16 | Ht 65.0 in | Wt 194.0 lb

## 2014-01-21 DIAGNOSIS — K0889 Other specified disorders of teeth and supporting structures: Secondary | ICD-10-CM

## 2014-01-21 DIAGNOSIS — IMO0001 Reserved for inherently not codable concepts without codable children: Secondary | ICD-10-CM

## 2014-01-21 DIAGNOSIS — Z79899 Other long term (current) drug therapy: Secondary | ICD-10-CM | POA: Insufficient documentation

## 2014-01-21 DIAGNOSIS — K088 Other specified disorders of teeth and supporting structures: Secondary | ICD-10-CM | POA: Insufficient documentation

## 2014-01-21 DIAGNOSIS — K1370 Unspecified lesions of oral mucosa: Secondary | ICD-10-CM

## 2014-01-21 DIAGNOSIS — F17210 Nicotine dependence, cigarettes, uncomplicated: Secondary | ICD-10-CM | POA: Insufficient documentation

## 2014-01-21 MED ORDER — AMOXICILLIN 500 MG PO CAPS: 500.0000 mg | ORAL_CAPSULE | Freq: Three times a day (TID) | ORAL | Status: DC

## 2014-01-21 MED ORDER — TRAMADOL HCL 50 MG PO TABS: 50.0000 mg | ORAL_TABLET | Freq: Three times a day (TID) | ORAL | Status: DC | PRN

## 2014-01-21 NOTE — Progress Notes (Signed)
Patient ID: Gabriel Ball, male   DOB: 16-Jun-1970, 44 y.o.   MRN: [id number]  CC: gum irritation  HPI: Gabriel Ball is a 44 y.o. male here today for a follow up visit.  Patient has past medical history of herpes zoster.  He reports that he believes he has a gum infection. He reports erythematous gums that bleed with flossing and brushing.  He has had this problem for 2 months.  He reports that before he was having pain on the right side and now he is having difficulty with the upper left side.  He has a bridge and believes he has a infection under the tooth.    Patient has No headache, No chest pain, No abdominal pain - No Nausea, No new weakness tingling or numbness, No Cough - SOB.  No Known Allergies Past Medical History  Diagnosis Date  . Herpes   . Shingles    Current Outpatient Prescriptions on File Prior to Visit  Medication Sig Dispense Refill  . acyclovir (ZOVIRAX) 400 MG tablet Take 1 tablet (400 mg total) by mouth 3 (three) times daily. (Patient not taking: Reported on 01/21/2014) 60 tablet 1  . amoxicillin (AMOXIL) 500 MG capsule Take 500 mg by mouth 3 (three) times daily.    . clindamycin (CLEOCIN) 150 MG capsule Take 150 mg by mouth every 6 (six) hours.  0  . HYDROcodone-acetaminophen (NORCO/VICODIN) 5-325 MG per tablet Take 1-2 tablets by mouth every 6 (six) hours as needed for moderate pain or severe pain. (Patient not taking: Reported on 01/21/2014) 15 tablet 0  . LORazepam (ATIVAN) 1 MG tablet Take 1 tablet (1 mg total) by mouth every 6 (six) hours as needed for anxiety. (Patient not taking: Reported on 01/21/2014) 10 tablet 0  . traMADol (ULTRAM) 50 MG tablet Take 1 tablet (50 mg total) by mouth every 6 (six) hours as needed. (Patient not taking: Reported on 01/21/2014) 15 tablet 0   No current facility-administered medications on file prior to visit.   Family History  Problem Relation Age of Onset  . Cancer Father    History   Social History  . Marital Status: Single   Spouse Name: N/A    Number of Children: N/A  . Years of Education: N/A   Occupational History  . Not on file.   Social History Main Topics  . Smoking status: Current Some Day Smoker  . Smokeless tobacco: Not on file  . Alcohol Use: Yes     Comment: rare  . Drug Use: No  . Sexual Activity: Not on file   Other Topics Concern  . Not on file   Social History Narrative    Review of Systems: See HPI   Objective:   Filed Vitals:   01/21/14 1153  BP: 108/66  Pulse: 74  Temp: 98.5 F (36.9 C)  Resp: 16    Physical Exam  Constitutional: He is oriented to person, place, and time.  HENT:  Erythematous gums  Cardiovascular: Normal rate, regular rhythm and normal heart sounds.   Pulmonary/Chest: Effort normal and breath sounds normal.  Abdominal: Soft. Bowel sounds are normal.  Neurological: He is alert and oriented to person, place, and time.  Skin: Skin is warm and dry.  Psychiatric: He has a normal mood and affect.     Lab Results  Component Value Date   WBC 9.8 12/01/2013   HGB 15.4 12/01/2013   HCT 43.1 12/01/2013   MCV 81.8 12/01/2013   PLT 238 12/01/2013  Lab Results  Component Value Date   CREATININE 0.70 12/01/2013   BUN 11 12/01/2013   NA 138 12/01/2013   K 3.8 12/01/2013   CL 101 12/01/2013   CO2 25 12/01/2013    No results found for: HGBA1C Lipid Panel  No results found for: CHOL, TRIG, HDL, CHOLHDL, VLDL, LDLCALC     Assessment and plan:   Gabriel Ball was seen today for osteoarthritis and follow-up.  Diagnoses and associated orders for this visit:  Irritation of gums and throat - Ambulatory referral to Dentistry  Pain, dental -  amoxicillin (AMOXIL) 500 MG capsule; Take 1 capsule (500 mg total) by mouth 3 (three) times daily. - traMADol (ULTRAM) 50 MG tablet; Take 1 tablet (50 mg total) by mouth every 8 (eight) hours as needed.  Due to language barrier, an interpreter was present during the history-taking and subsequent discussion (and  for part of the physical exam) with this patient.   Return if symptoms worsen or fail to improve.        Holland Commons, NP-C [facility] [phone] 01/21/2014, 12:16 PM

## 2014-01-21 NOTE — Progress Notes (Signed)
Patient is here for dental referral for left upper gum pain Patient not taking medications other than advil Patient inquiring about shingles shot Patient refused flu shot  Lance Bosch

## 2014-01-26 ENCOUNTER — Encounter (HOSPITAL_COMMUNITY): Payer: Self-pay | Admitting: *Deleted

## 2014-01-26 ENCOUNTER — Telehealth: Payer: Self-pay | Admitting: Internal Medicine

## 2014-01-26 ENCOUNTER — Emergency Department (INDEPENDENT_AMBULATORY_CARE_PROVIDER_SITE_OTHER)
Admission: EM | Admit: 2014-01-26 | Discharge: 2014-01-26 | Disposition: A | Discharge status: Home / Self Care | Payer: No Typology Code available for payment source | Source: Home / Self Care | Attending: Emergency Medicine | Admitting: Emergency Medicine

## 2014-01-26 DIAGNOSIS — K122 Cellulitis and abscess of mouth: Secondary | ICD-10-CM

## 2014-01-26 DIAGNOSIS — T7840XA Allergy, unspecified, initial encounter: Secondary | ICD-10-CM

## 2014-01-26 DIAGNOSIS — Z889 Allergy status to unspecified drugs, medicaments and biological substances status: Secondary | ICD-10-CM

## 2014-01-26 MED ORDER — IBUPROFEN 800 MG PO TABS: 800.0000 mg | ORAL_TABLET | Freq: Three times a day (TID) | ORAL | Status: DC

## 2014-01-26 MED ORDER — CLINDAMYCIN HCL 150 MG PO CAPS: 150.0000 mg | ORAL_CAPSULE | Freq: Three times a day (TID) | ORAL | Status: DC

## 2014-01-26 MED ORDER — CLINDAMYCIN HCL 150 MG PO CAPS: 150.0000 mg | ORAL_CAPSULE | Freq: Four times a day (QID) | ORAL | Status: AC

## 2014-01-26 NOTE — ED Provider Notes (Signed)
CSN: [id number]     Arrival date & time 01/26/14  1036 History   First MD Initiated Contact with Patient 01/26/14 1102     Chief Complaint  Patient presents with  . Allergic Reaction   (Consider location/radiation/quality/duration/timing/severity/associated sxs/prior Treatment) HPI Comments: [name] is a 44 yo Hispanic male who presents with pruritis following Amoxicillin for 2 days. English is marginal;  His son is assistanting with language barrier. Patient has been on Amoxicillin for 2 days; given in the ER for dental abscess along the right upper gum. Shorty after starting this he notes a rash to arms and chest. He reports never taking the medication before. HE stopped it after 24 hours and the rash is gone; but mild itching remains. He wishes to have another antibiotic until he can see the Dentist; having a appt on Monday. He denies fever or chills. Noted swelling remains in his right upper gum line with pain along the incisor. Remainder of ROS are reviewed.   Patient is a 44 y.o. male presenting with allergic reaction. The history is provided by the patient and a relative.  Allergic Reaction Presenting symptoms: no rash     Past Medical History  Diagnosis Date  . Herpes   . Shingles    History reviewed. No pertinent past surgical history. Family History  Problem Relation Age of Onset  . Cancer Father    History  Substance Use Topics  . Smoking status: Current Some Day Smoker  . Smokeless tobacco: Not on file  . Alcohol Use: Yes     Comment: rare    Review of Systems  Constitutional: Negative for fever and fatigue.  HENT: Positive for dental problem. Negative for facial swelling, mouth sores and sinus pressure.   Eyes: Negative.   Respiratory: Negative.   Skin: Negative for rash.  Allergic/Immunologic: Negative.     Allergies  Amoxicillin  Home Medications   Prior to Admission medications   Medication Sig Start Date End Date Taking? Authorizing Provider   acyclovir (ZOVIRAX) 400 MG tablet Take 1 tablet (400 mg total) by mouth 3 (three) times daily. Patient not taking: Reported on 01/21/2014 11/19/13   Ambrose Finland, NP  clindamycin (CLEOCIN) 150 MG capsule Take 1 capsule (150 mg total) by mouth 4 (four) times daily. 01/26/14 01/30/14  Riki Sheer, PA-C  HYDROcodone-acetaminophen (NORCO/VICODIN) 5-325 MG per tablet Take 1-2 tablets by mouth every 6 (six) hours as needed for moderate pain or severe pain. Patient not taking: Reported on 01/21/2014 11/10/13   Monte Fantasia, PA-C  ibuprofen (ADVIL,MOTRIN) 800 MG tablet Take 1 tablet (800 mg total) by mouth 3 (three) times daily. 01/26/14   Riki Sheer, PA-C  LORazepam (ATIVAN) 1 MG tablet Take 1 tablet (1 mg total) by mouth every 6 (six) hours as needed for anxiety. Patient not taking: Reported on 01/21/2014 12/01/13   Mora Bellman, PA-C  traMADol (ULTRAM) 50 MG tablet Take 1 tablet (50 mg total) by mouth every 6 (six) hours as needed. Patient not taking: Reported on 01/21/2014 12/01/13   Mora Bellman, PA-C  traMADol (ULTRAM) 50 MG tablet Take 1 tablet (50 mg total) by mouth every 8 (eight) hours as needed. 01/21/14   Ambrose Finland, NP   BP 102/64 mmHg  Pulse 68  Temp(Src) 99 F (37.2 C) (Oral)  Resp 18  SpO2 98% Physical Exam  Constitutional: He is oriented to person, place, and time. He appears well-developed and well-nourished. No distress.  HENT:  Head: Normocephalic and atraumatic.  Mouth/Throat: No oropharyngeal exudate.  Left upper gum line with erythema and mild swelling along the left upper incisor. No drainage or frankl abscess noted, tender to palpaiton  Pulmonary/Chest: Effort normal.  Neurological: He is alert and oriented to person, place, and time.  Skin: Skin is warm and dry. No rash noted.  Psychiatric: His behavior is normal.  Nursing note and vitals reviewed.   ED Course  Procedures (including critical care time) Labs Review Labs Reviewed - No data to  display  Imaging Review No results found.   MDM   1. Oral infection   2. Allergic reaction caused by a drug    1. Stip Amox and added to Allergies: Changed to Cleocin and recommended to keep f/u with Dentist on Monday. Ibuprofen given for pain.  2. May use Benadryl when needed for itching.     Riki SheerMichelle G Saxton Chain, PA-C 01/26/14 1141

## 2014-01-26 NOTE — ED Notes (Signed)
Pt reports  Itching     Fr   sev  Days   He  Has  Been taking  amox and  ibuprophen  For dental  Problems       hE  IS  SITTING  UPRIGHT ON  THE  EXAM TABLE  SPEAKING IN  COMPLETE  SENTANCES  AND  APPEARS  IN NO  ACUTE  DISTRESS

## 2014-01-26 NOTE — Telephone Encounter (Signed)
Patient has come in today to say that he has been itching all over from his anti biotic medication amoxicillin (AMOXIL) 500 MG capsule;  Patient has been on medication since Monday and say that sometimes he fells as though he can not breathe; please f/u with patient about his request;

## 2014-01-26 NOTE — Discharge Instructions (Signed)
Absceso (Abscess)  Un absceso es una zona infectada que contiene pus y desechos.Puede aparecer en cualquier parte del cuerpo. Tambin se lo conoce como fornculo o divieso. CAUSAS  Ocurre cuando los tejidos se infectan. Tambin puede formarse por obstruccin de las glndulas sebceas o las glndulas sudorparas, infeccin de los folculos pilosos o por una lesin pequea en la piel. A medida que el organismo lucha contra la infeccin, se acumula pus en la zona y hace presin debajo de la piel. Esta presin causa dolor. Las personas con un sistema inmunolgico debilitado tienen dificultad para Industrial/product designerluchar contra las infecciones y pueden formar abscesos con ms frecuencia.  SNTOMAS  Generalmente un absceso se forma sobre la piel y se vuelve una masa dolorosa, roja, caliente y sensible. Si se forma debajo de la piel, podr sentir como una zona blanda, que se [place], debajo de la piel. Algunos abscesos se abren (ruptura) por s mismos, pero la mayora seguir empeorando si no se lo trata. La infeccin puede diseminarse hacia otros sitios del cuerpo y finalmente al torrente sanguneo y hace que el enfermo se sienta mal.  DIAGNSTICO  El mdico le har una historia clnica y un examen fsico. Podrn tomarle Lauris Poaguna muestra de lquido del absceso y Public librariananalizarlo para Clinical research associateencontrar la causa de la infeccin. .  TRATAMIENTO  El mdico le indicar antibiticos para combatir la infeccin. Sin embargo, el uso de antibiticos solamente no curar el absceso. El mdico tendr que hacer un pequeo corte (incisin) en el absceso para drenar el pus. En algunos casos se introduce una gasa en el absceso para reducir Chief Technology Officerel dolor y que siga drenando la zona.  INSTRUCCIONES PARA EL CUIDADO EN EL HOGAR   Solo tome medicamentos de venta libre o recetados para Chief Technology Officerel dolor, Dentistmalestar o fiebre, segn las indicaciones del mdico.  Si le han recetado antibiticos, tmelos segn las indicaciones. Tmelos todos, aunque se sienta mejor.  Si le aplicaron  una gasa, siga las indicaciones del mdico para Nigeriacambiarla.  Para evitar la propagacin de la infeccin:  Mantenga el absceso cubierto con el vendaje.  Lvese bien las manos.  No comparta artculos de cuidado personal, toallas o jacuzzis con los dems.  Evite el contacto con la piel de Economistotras personas.  Mantenga la piel y la ropa limpia alrededor del absceso.  Cumpla con todas las visitas de control, segn le indique su mdico. SOLICITE ATENCIN MDICA SI:   Aumenta el dolor, la hinchazn, el enrojecimiento, drena lquido o sangra.  Siente dolores musculares, escalofros, o una sensacin general de Dentistmalestar.  Tiene fiebre. ASEGRESE DE QUE:   Comprende estas instrucciones.  Controlar su enfermedad.  Solicitar ayuda de inmediato si no mejora o si empeora. Document Released: 01/04/2005 Document Revised: 07/06/2011 [facility] Patient Information 2015 [employer], [place]. This information is not intended to replace advice given to you by your health care provider. Make sure you discuss any questions you have with your health care provider.     Take Benadry as directed if needed for itching. May use the NEW antibiotic until Dental appt. The Dentist may change this. Keep appt for further evaluation.

## 2014-01-28 ENCOUNTER — Telehealth: Payer: Self-pay | Admitting: Internal Medicine

## 2014-01-28 NOTE — Telephone Encounter (Signed)
Patient is calling to speak to nurse, please f/u with pt. °

## 2014-01-28 NOTE — Telephone Encounter (Signed)
Returned call- no answer. 

## 2014-01-28 NOTE — Telephone Encounter (Signed)
Left voice message to return call (left message in Spanish)

## 2014-02-03 ENCOUNTER — Encounter: Payer: Self-pay | Admitting: Internal Medicine

## 2014-02-16 ENCOUNTER — Encounter (HOSPITAL_COMMUNITY): Payer: Self-pay | Admitting: *Deleted

## 2014-02-16 ENCOUNTER — Emergency Department (INDEPENDENT_AMBULATORY_CARE_PROVIDER_SITE_OTHER)
Admission: EM | Admit: 2014-02-16 | Discharge: 2014-02-16 | Disposition: A | Discharge status: Home / Self Care | Payer: Self-pay | Source: Home / Self Care | Attending: Emergency Medicine | Admitting: Emergency Medicine

## 2014-02-16 DIAGNOSIS — N451 Epididymitis: Secondary | ICD-10-CM

## 2014-02-16 LAB — POCT URINALYSIS DIP (DEVICE)
Bilirubin Urine: NEGATIVE
Glucose, UA: NEGATIVE mg/dL
HGB URINE DIPSTICK: NEGATIVE
Ketones, ur: NEGATIVE mg/dL
Leukocytes, UA: NEGATIVE
Nitrite: NEGATIVE
Protein, ur: NEGATIVE mg/dL
Specific Gravity, Urine: 1.025 (ref 1.005–1.030)
Urobilinogen, UA: 0.2 mg/dL (ref 0.0–1.0)
pH: 5.5 (ref 5.0–8.0)

## 2014-02-16 MED ORDER — IBUPROFEN 800 MG PO TABS: 800.0000 mg | ORAL_TABLET | Freq: Three times a day (TID) | ORAL | Status: DC

## 2014-02-16 NOTE — ED Notes (Signed)
C/O right testicular pain and swelling without injury x 4 days.  States stopped clindamycin 3 wks ago - Rx insert states it can cause testicular problems.

## 2014-02-16 NOTE — Discharge Instructions (Signed)
Epididimitis (Epididymitis) La epididimitis es una inflamacin (reaccin del organismo a una lesin o infeccin) del epiddimo. El epiddimo es [place] estructura similar una cuerda ubicada en la parte posterior de los testculos. Generalmente la causa es una infeccin, aunque no siempre. Generalmente se trata de un trastorno sbito, que comienza con escalofros, fiebre y Engineer, miningdolor detrs del escroto y en el testculo. Puede haber hinchazn y enrojecimiento de los testculos. DIAGNSTICO El examen fsico puede revelar un epiddimo sensible e hinchado. Los cultivos de [place] y de las secreciones prostticas ayudarn a Production assistant, radiodeterminar la causa de la infeccin. Algunas veces se practica un anlisis de sangre para observar si el recuento de glbulos blancos es elevado y si se trata de una infeccin bacteriana (grmenes) o viral. Con estos datos, el profesional que lo asiste podr Psychologist, counsellingrecetarle un antibitico (medicamentos que destruyen los grmenes) adecuado para la infeccin bacteriana. La infeccin viral que ocasiona la epididimitis generalmente se resolver sin tratamiento. INSTRUCCIONES PARA EL CUIDADO DOMICILIARIO  Para aliviar el dolor, tome baos de asiento calientes durante 20 minutos, cuatro veces por Futures traderda.  Utilice los medicamentos de venta libre o de prescripcin para Chief Technology Officerel dolor, Environmental health practitionerel malestar o la [place]bre, segn se lo indique el profesional que lo asiste.  Tome la medicacin, incluidos los antibiticos, como se le indic. Tome los antibiticos durante todo el tiempo que le han indicado, aun si se siente mejor.  Es muy importante concurrir a todas las citas para Animatorel seguimiento. SOLICITE ATENCIN MDICA DE INMEDIATO SI:  Tiene fiebre.  El dolor no se alivia con los United Parcelmedicamentos.  Los sntomas (problemas) que originalmente lo trajeron a Stage managerla consulta empeoran.  El dolor puede aparecer y Geneticist, moleculardesaparecer.  Comienza a Financial risk analystsentir dolor, observa enrojecimiento e hinchazn en el escroto y en las zonas que lo rodean. EST  SEGURO QUE:  Comprende las instrucciones para el alta mdica.  Controlar su enfermedad.  Solicitar atencin mdica de inmediato segn las indicaciones. Document Released: 01/04/2005 Document Revised: 03/29/2011 [facility] Patient Information 2015 [place], [place]. This information is not intended to replace advice given to you by your health care provider. Make sure you discuss any questions you have with your health care provider.    No signs of infection is noted. Treat symptomatically with rest, ice and Ibuprofen.

## 2014-02-16 NOTE — ED Provider Notes (Signed)
CSN: [id number]     Arrival date & time 02/16/14  [id number] History   First MD Initiated Contact with Patient 02/16/14 1051     Chief Complaint  Patient presents with  . Testicle Pain   (Consider location/radiation/quality/duration/timing/severity/associated sxs/prior Treatment) HPI Comments: Language barrier; helped interpret through son. Patient presents with non-painful right testicular swelling. No redness or warmth. Onset 4 days. No fever or chills. Overall feels well. No urinary symptoms are noted. No prior history.   The history is provided by the patient.    Past Medical History  Diagnosis Date  . Herpes   . Shingles    History reviewed. No pertinent past surgical history. Family History  Problem Relation Age of Onset  . Cancer Father    History  Substance Use Topics  . Smoking status: Former Games developermoker  . Smokeless tobacco: Not on file  . Alcohol Use: Yes    Review of Systems  All other systems reviewed and are negative.   Allergies  Amoxicillin  Home Medications   Prior to Admission medications   Medication Sig Start Date End Date Taking? Authorizing Provider  acyclovir (ZOVIRAX) 400 MG tablet Take 1 tablet (400 mg total) by mouth 3 (three) times daily. Patient not taking: Reported on 01/21/2014 11/19/13   Ambrose FinlandValerie A Keck, NP  HYDROcodone-acetaminophen (NORCO/VICODIN) 5-325 MG per tablet Take 1-2 tablets by mouth every 6 (six) hours as needed for moderate pain or severe pain. Patient not taking: Reported on 01/21/2014 11/10/13   Monte FantasiaJoseph W Mintz, PA-C  ibuprofen (ADVIL,MOTRIN) 800 MG tablet Take 1 tablet (800 mg total) by mouth 3 (three) times daily. 02/16/14   Riki SheerMichelle G Young, PA-C  ibuprofen (ADVIL,MOTRIN) 800 MG tablet Take 1 tablet (800 mg total) by mouth 3 (three) times daily. 02/16/14   Riki SheerMichelle G Young, PA-C  LORazepam (ATIVAN) 1 MG tablet Take 1 tablet (1 mg total) by mouth every 6 (six) hours as needed for anxiety. Patient not taking: Reported on 01/21/2014 12/01/13    Mora BellmanHannah S Merrell, PA-C  traMADol (ULTRAM) 50 MG tablet Take 1 tablet (50 mg total) by mouth every 6 (six) hours as needed. Patient not taking: Reported on 01/21/2014 12/01/13   Mora BellmanHannah S Merrell, PA-C  traMADol (ULTRAM) 50 MG tablet Take 1 tablet (50 mg total) by mouth every 8 (eight) hours as needed. 01/21/14   Ambrose FinlandValerie A Keck, NP   BP 100/64 mmHg  Pulse 75  Temp(Src) 97.7 F (36.5 C) (Oral)  Resp 16  SpO2 96% Physical Exam  Constitutional: He appears well-developed and well-nourished. No distress.  HENT:  Head: Normocephalic and atraumatic.  Abdominal: Soft. Bowel sounds are normal. There is no tenderness.  Genitourinary: Penis normal. No penile tenderness.  No swelling appreciated on exam. No tenderness, erythema or warmth. No inguinal hernia is appreciated.   Skin: Skin is warm and dry. He is not diaphoretic.  Psychiatric: His behavior is normal.  Nursing note and vitals reviewed.   ED Course  Procedures (including critical care time) Labs Review Labs Reviewed  POCT URINALYSIS DIP (DEVICE)    Imaging Review No results found.   MDM   1. Epididymitis without abscess    Non-infectious based on presentation and normal exam and urine.  Indication for rest, NSAIDs and icing. No antibiotic indicated. F/U if becomes painful, red or worsening swelling.     Riki SheerMichelle G Young, PA-C 02/16/14 1131

## 2014-02-23 ENCOUNTER — Emergency Department (HOSPITAL_COMMUNITY)
Admission: EM | Admit: 2014-02-23 | Discharge: 2014-02-23 | Disposition: A | Discharge status: Home / Self Care | Payer: No Typology Code available for payment source | Attending: Emergency Medicine | Admitting: Emergency Medicine

## 2014-02-23 ENCOUNTER — Emergency Department (HOSPITAL_COMMUNITY): Payer: No Typology Code available for payment source

## 2014-02-23 ENCOUNTER — Encounter (HOSPITAL_COMMUNITY): Payer: Self-pay | Admitting: Physical Medicine and Rehabilitation

## 2014-02-23 DIAGNOSIS — N508 Other specified disorders of male genital organs: Secondary | ICD-10-CM | POA: Insufficient documentation

## 2014-02-23 DIAGNOSIS — N50819 Testicular pain, unspecified: Secondary | ICD-10-CM

## 2014-02-23 DIAGNOSIS — Z880 Allergy status to penicillin: Secondary | ICD-10-CM | POA: Insufficient documentation

## 2014-02-23 DIAGNOSIS — Z8619 Personal history of other infectious and parasitic diseases: Secondary | ICD-10-CM | POA: Insufficient documentation

## 2014-02-23 DIAGNOSIS — Z791 Long term (current) use of non-steroidal anti-inflammatories (NSAID): Secondary | ICD-10-CM | POA: Insufficient documentation

## 2014-02-23 DIAGNOSIS — Z87891 Personal history of nicotine dependence: Secondary | ICD-10-CM | POA: Insufficient documentation

## 2014-02-23 LAB — URINALYSIS, ROUTINE W REFLEX MICROSCOPIC
Bilirubin Urine: NEGATIVE
GLUCOSE, UA: NEGATIVE mg/dL
Hgb urine dipstick: NEGATIVE
Ketones, ur: NEGATIVE mg/dL
Leukocytes, UA: NEGATIVE
NITRITE: NEGATIVE
Protein, ur: NEGATIVE mg/dL
SPECIFIC GRAVITY, URINE: 1.021 (ref 1.005–1.030)
Urobilinogen, UA: 0.2 mg/dL (ref 0.0–1.0)
pH: 6.5 (ref 5.0–8.0)

## 2014-02-23 LAB — CBC WITH DIFFERENTIAL/PLATELET
BASOS ABS: 0 10*3/uL (ref 0.0–0.1)
Basophils Relative: 1 % (ref 0–1)
EOS ABS: 0.3 10*3/uL (ref 0.0–0.7)
Eosinophils Relative: 5 % (ref 0–5)
HCT: 40.3 % (ref 39.0–52.0)
Hemoglobin: 14.3 g/dL (ref 13.0–17.0)
LYMPHS ABS: 2 10*3/uL (ref 0.7–4.0)
LYMPHS PCT: 34 % (ref 12–46)
MCH: 28.7 pg (ref 26.0–34.0)
MCHC: 35.5 g/dL (ref 30.0–36.0)
MCV: 80.8 fL (ref 78.0–100.0)
Monocytes Absolute: 0.4 10*3/uL (ref 0.1–1.0)
Monocytes Relative: 7 % (ref 3–12)
Neutro Abs: 3.2 10*3/uL (ref 1.7–7.7)
Neutrophils Relative %: 55 % (ref 43–77)
PLATELETS: 198 10*3/uL (ref 150–400)
RBC: 4.99 MIL/uL (ref 4.22–5.81)
RDW: 13.3 % (ref 11.5–15.5)
WBC: 5.8 10*3/uL (ref 4.0–10.5)

## 2014-02-23 LAB — COMPREHENSIVE METABOLIC PANEL
ALBUMIN: 3.8 g/dL (ref 3.5–5.2)
ALK PHOS: 68 U/L (ref 39–117)
ALT: 29 U/L (ref 0–53)
AST: 24 U/L (ref 0–37)
Anion gap: 3 — ABNORMAL LOW (ref 5–15)
BUN: 10 mg/dL (ref 6–23)
CHLORIDE: 109 mmol/L (ref 96–112)
CO2: 28 mmol/L (ref 19–32)
CREATININE: 0.6 mg/dL (ref 0.50–1.35)
Calcium: 8.6 mg/dL (ref 8.4–10.5)
GFR calc non Af Amer: >90 mL/min (ref >90)
GLUCOSE: 93 mg/dL (ref 70–99)
POTASSIUM: 3.6 mmol/L (ref 3.5–5.1)
SODIUM: 140 mmol/L (ref 135–145)
TOTAL PROTEIN: 6.5 g/dL (ref 6.0–8.3)
Total Bilirubin: 0.4 mg/dL (ref 0.3–1.2)

## 2014-02-23 MED ORDER — OXYCODONE-ACETAMINOPHEN 5-325 MG PO TABS: 1.0000 | ORAL_TABLET | ORAL | Status: DC | PRN

## 2014-02-23 NOTE — ED Provider Notes (Signed)
CSN: [id number]     Arrival date & time 02/23/14  1019 History   First MD Initiated Contact with Patient 02/23/14 1028     Chief Complaint  Patient presents with  . Testicle Pain  . Abdominal Pain     (Consider location/radiation/quality/duration/timing/severity/associated sxs/prior Treatment) Patient is a 44 y.o. male presenting with testicular pain and abdominal pain. The history is provided by the patient and medical records. The history is limited by a language barrier. A language interpreter was used.  Testicle Pain Associated symptoms include abdominal pain.  Abdominal Pain   This is a 44 year old male with no significant past medical history presenting to the ED for right testicle pain. He was seen at urgent care 1 week ago for the same and discharged home with supportive care. He did not have any imaging at that time. He states pain has been present for the past 3 weeks, described as constant and aching. He states he has some intermittent pains into his lower abdomen. No associated nausea, vomiting, diarrhea, or urinary symptoms. No hx of hernias or recent heavy lifting.  No pelvic or abdominal trauma.  No fever, chills, urethral discharge.  Past Medical History  Diagnosis Date  . Herpes   . Shingles    History reviewed. No pertinent past surgical history. Family History  Problem Relation Age of Onset  . Cancer Father    History  Substance Use Topics  . Smoking status: Former Games developer  . Smokeless tobacco: Not on file  . Alcohol Use: Yes    Review of Systems  Gastrointestinal: Positive for abdominal pain.  Genitourinary: Positive for testicular pain.  All other systems reviewed and are negative.     Allergies  Amoxicillin  Home Medications   Prior to Admission medications   Medication Sig Start Date End Date Taking? Authorizing Provider  acyclovir (ZOVIRAX) 400 MG tablet Take 1 tablet (400 mg total) by mouth 3 (three) times daily. Patient not taking: Reported  on 01/21/2014 11/19/13   Ambrose Finland, NP  HYDROcodone-acetaminophen (NORCO/VICODIN) 5-325 MG per tablet Take 1-2 tablets by mouth every 6 (six) hours as needed for moderate pain or severe pain. Patient not taking: Reported on 01/21/2014 11/10/13   Monte Fantasia, PA-C  ibuprofen (ADVIL,MOTRIN) 800 MG tablet Take 1 tablet (800 mg total) by mouth 3 (three) times daily. 02/16/14   Riki Sheer, PA-C  ibuprofen (ADVIL,MOTRIN) 800 MG tablet Take 1 tablet (800 mg total) by mouth 3 (three) times daily. 02/16/14   Riki Sheer, PA-C  LORazepam (ATIVAN) 1 MG tablet Take 1 tablet (1 mg total) by mouth every 6 (six) hours as needed for anxiety. Patient not taking: Reported on 01/21/2014 12/01/13   Mora Bellman, PA-C  traMADol (ULTRAM) 50 MG tablet Take 1 tablet (50 mg total) by mouth every 6 (six) hours as needed. Patient not taking: Reported on 01/21/2014 12/01/13   Mora Bellman, PA-C  traMADol (ULTRAM) 50 MG tablet Take 1 tablet (50 mg total) by mouth every 8 (eight) hours as needed. 01/21/14   Ambrose Finland, NP   BP 114/55 mmHg  Pulse 71  Temp(Src) 97.9 F (36.6 C) (Oral)  Resp 18  SpO2 98%   Physical Exam  Constitutional: He is oriented to person, place, and time. He appears well-developed and well-nourished.  HENT:  Head: Normocephalic and atraumatic.  Mouth/Throat: Oropharynx is clear and moist.  Eyes: Conjunctivae and EOM are normal. Pupils are equal, round, and reactive to light.  Neck:  Normal range of motion.  Cardiovascular: Normal rate, regular rhythm and normal heart sounds.   Pulmonary/Chest: Effort normal and breath sounds normal. No respiratory distress. He has no wheezes.  Abdominal: Soft. Bowel sounds are normal. There is no tenderness. There is no guarding.  Genitourinary: Penis normal. Cremasteric reflex is present. Uncircumcised. No penile erythema. No discharge found.  Right testicle slightly higher than left without abnormal lye; no appreciable swelling; mild tenderness  noted along lateral right testicle with appreciable mass or abscess formation; left testicle non-tender; no urethral discharge noted; no inguinal hernia appreciated  Musculoskeletal: Normal range of motion.  Neurological: He is alert and oriented to person, place, and time.  Skin: Skin is warm and dry.  Psychiatric: He has a normal mood and affect.  Nursing note and vitals reviewed.   ED Course  Procedures (including critical care time) Labs Review Labs Reviewed  COMPREHENSIVE METABOLIC PANEL - Abnormal; Notable for the following:    Anion gap 3 (*)    All other components within normal limits  CBC WITH DIFFERENTIAL/PLATELET  URINALYSIS, ROUTINE W REFLEX MICROSCOPIC    Imaging Review US Scrotum  02/23/2014   CLINICAL DATA:  Right-sided testicular pain for 3 weeks.  EXAM: SCROTAL ULTRASOUND  DOPPLER ULTRASOUND OF THE TESTICLES  TECHNIQUE: Complete ultrasound examination of the testicles, epididymis, and other scrotal structures was performed. Color and spectral Doppler ultrasound were also utilized to evaluate blood flow to the testicles.  COMPARISON:  None.  FINDINGS: Right testicle  Measurements: Normal in size measuring 4.3 x 2.7 x 3.2 cm. There is relative homogeneous echogenicity of the right testicular parenchyma. No discrete intra or extra testicular mass.  Left testicle  Measurements: Normal in size measuring 4.3 x 2.4 x 2.7 cm. There is an ill-defined punctate area of increased echogenicity within the lateral aspect of the left testis (representative images 41 and 45) which is favored to represent portion of the rete testis. No discrete intra or extratesticular mass.  Right epididymis:  Normal in size and appearance.  Left epididymis: Normal in size and appearance. Note is made of a punctate (approximately 0.3 cm) anechoic spermatocele/epididymal cysts.  Hydrocele: Note is made of small right-sided and trace left-sided hydroceles  Varicocele:  Small bilateral varicoceles, left greater  than right.  Pulsed Doppler interrogation of both testes demonstrates normal low resistance arterial and venous waveforms bilaterally.  IMPRESSION: 1. No evidence of testicular mass or torsion. 2. Nonspecific small right and trace left-sided hydroceles. 3. Note is made of small bilateral varicoceles, left greater than right.   Electronically Signed   By: Simonne Come M.D.   On: 02/23/2014 11:57   [place] Art/ven Flow Abd Pelv Doppler  02/23/2014   CLINICAL DATA:  Right-sided testicular pain for 3 weeks.  EXAM: SCROTAL ULTRASOUND  DOPPLER ULTRASOUND OF THE TESTICLES  TECHNIQUE: Complete ultrasound examination of the testicles, epididymis, and other scrotal structures was performed. Color and spectral Doppler ultrasound were also utilized to evaluate blood flow to the testicles.  COMPARISON:  None.  FINDINGS: Right testicle  Measurements: Normal in size measuring 4.3 x 2.7 x 3.2 cm. There is relative homogeneous echogenicity of the right testicular parenchyma. No discrete intra or extra testicular mass.  Left testicle  Measurements: Normal in size measuring 4.3 x 2.4 x 2.7 cm. There is an ill-defined punctate area of increased echogenicity within the lateral aspect of the left testis (representative images 41 and 45) which is favored to represent portion of the rete testis. No discrete intra  or extratesticular mass.  Right epididymis:  Normal in size and appearance.  Left epididymis: Normal in size and appearance. Note is made of a punctate (approximately 0.3 cm) anechoic spermatocele/epididymal cysts.  Hydrocele: Note is made of small right-sided and trace left-sided hydroceles  Varicocele:  Small bilateral varicoceles, left greater than right.  Pulsed Doppler interrogation of both testes demonstrates normal low resistance arterial and venous waveforms bilaterally.  IMPRESSION: 1. No evidence of testicular mass or torsion. 2. Nonspecific small right and trace left-sided hydroceles. 3. Note is made of small bilateral  varicoceles, left greater than right.   Electronically Signed   By: Simonne ComeJohn  Watts M.D.   On: 02/23/2014 11:57     EKG Interpretation None      MDM   Final diagnoses:  Testicle pain   44 year old male with 3 week hx of right testicle pain.  Seen at urgent care 1 week ago for the same without imaging at that time, pain persists now with some radiation to lower abdomen.  Abdominal exam is benign.  No noted swelling, abnormal lye, or signs of abscess formation.  No hernia appreciated.  Will obtain labs, u/a, scrotal u/s.  Lab work reassuring.  Scrotal u/s negative for torsion-- small hydroceles noted as well as varicoceles.  U/a non-infectious.   Abdominal exam remains benign.  Patient will be d/c home with urology FU.  Rx percocet for pain control.  Discussed plan with patient, he/she acknowledged understanding and agreed with plan of care.  Return precautions given for new or worsening symptoms.  Garlon HatchetLisa M Izaiyah Kleinman, PA-C 02/23/14 1411  Toy CookeyMegan Docherty, MD 02/24/14 [id number]

## 2014-02-23 NOTE — ED Notes (Signed)
Pt states pain to bilateral testicles, also states lower abdominal pain. Ongoing x3 weeks. Denies urinary symptoms.

## 2014-02-23 NOTE — ED Notes (Signed)
PT just returned from US.

## 2014-02-23 NOTE — Discharge Instructions (Signed)
Your ultrasound and lab work was normal today.  Take the prescribed medication as needed for pain. Follow-up with urology-- call to schedule appt. Return to the ED for new or worsening symptoms.

## 2014-02-27 ENCOUNTER — Emergency Department (HOSPITAL_COMMUNITY): Payer: No Typology Code available for payment source

## 2014-02-27 ENCOUNTER — Emergency Department (HOSPITAL_COMMUNITY)
Admission: EM | Admit: 2014-02-27 | Discharge: 2014-02-27 | Disposition: A | Discharge status: Home / Self Care | Payer: Self-pay | Attending: Emergency Medicine | Admitting: Emergency Medicine

## 2014-02-27 ENCOUNTER — Encounter (HOSPITAL_COMMUNITY): Payer: Self-pay | Admitting: Emergency Medicine

## 2014-02-27 DIAGNOSIS — Z880 Allergy status to penicillin: Secondary | ICD-10-CM | POA: Insufficient documentation

## 2014-02-27 DIAGNOSIS — Z79899 Other long term (current) drug therapy: Secondary | ICD-10-CM | POA: Insufficient documentation

## 2014-02-27 DIAGNOSIS — Z8619 Personal history of other infectious and parasitic diseases: Secondary | ICD-10-CM | POA: Insufficient documentation

## 2014-02-27 DIAGNOSIS — Z87891 Personal history of nicotine dependence: Secondary | ICD-10-CM | POA: Insufficient documentation

## 2014-02-27 DIAGNOSIS — F419 Anxiety disorder, unspecified: Secondary | ICD-10-CM | POA: Insufficient documentation

## 2014-02-27 DIAGNOSIS — Z791 Long term (current) use of non-steroidal anti-inflammatories (NSAID): Secondary | ICD-10-CM | POA: Insufficient documentation

## 2014-02-27 MED ORDER — LORAZEPAM 0.5 MG PO TABS
1.0000 mg | ORAL_TABLET | Freq: Once | ORAL | Status: AC
  Administered 2014-02-27: 1 mg via ORAL
  Filled 2014-02-27: qty 2

## 2014-02-27 MED ORDER — LORAZEPAM 1 MG PO TABS: 1.0000 mg | ORAL_TABLET | Freq: Three times a day (TID) | ORAL | Status: DC | PRN

## 2014-02-27 MED ORDER — TETRACAINE HCL 0.5 % OP SOLN
2.0000 [drp] | Freq: Once | OPHTHALMIC | Status: AC
  Administered 2014-02-27: 2 [drp] via OPHTHALMIC
  Filled 2014-02-27: qty 2

## 2014-02-27 MED ORDER — FLUORESCEIN SODIUM 1 MG OP STRP
1.0000 | ORAL_STRIP | Freq: Once | OPHTHALMIC | Status: AC
  Administered 2014-02-27: 1 via OPHTHALMIC
  Filled 2014-02-27: qty 1

## 2014-02-27 NOTE — ED Notes (Signed)
Patient states R eye pain that started yesterday.  Patient denies injury.  Patient states he doesn't think anything is in his eye.

## 2014-02-27 NOTE — ED Provider Notes (Signed)
CSN: [id number]     Arrival date & time 02/27/14  1309 History  This chart was scribed for non-physician practitioner Wynetta EmeryNicole Stashia Sia, PA-C working with Samuel JesterKathleen McManus, DO by Littie Deedsichard Sun, ED Scribe. This patient was seen in room TR04C/TR04C and the patient's care was started at 2:20 PM.       Chief Complaint  Patient presents with  . Eye Pain   The history is provided by the patient. A language interpreter was used.    HPI Comments: Gabriel Ball is a 44 y.o. Spanish-speaking male who presents to the Emergency Department complaining of right eye dryness that started 3 days ago. Patient states that the nerves in his face have been bothering him. Describes it as a sensation of little animals running all over his right cheek and in the right scalp area. He also reports having increased stress, difficulty sleeping, nausea, and lightheadedness. Patient has been having increased stress because his father in [place] has been ill. He has tried polymixin eyedrops without relief; however, he did not realize that he was using antibiotic eyedrops. Patient denies eye pain (note that this contradicts nursing note) change in vision, eye discharge, eye redness, eye trauma, HA, chest pain, SOB, dysarthria, ataxia. He also denies hx of DM and HTN. He states that he has had similar symptoms in the past whenever he is stressed. He has been seen for this in the past; he was given some medications then which provided relief to his symptoms. Patient has an appointment with his PCP next week.   Past Medical History  Diagnosis Date  . Herpes   . Shingles    History reviewed. No pertinent past surgical history. Family History  Problem Relation Age of Onset  . Cancer Father    History  Substance Use Topics  . Smoking status: Former Games developermoker  . Smokeless tobacco: Not on file  . Alcohol Use: Yes    Review of Systems A complete 10 system review of systems was obtained and all systems are negative except as noted in  the HPI and PMH.     Allergies  Penicillins and Amoxicillin  Home Medications   Prior to Admission medications   Medication Sig Start Date End Date Taking? Authorizing Provider  acyclovir (ZOVIRAX) 400 MG tablet Take 1 tablet (400 mg total) by mouth 3 (three) times daily. Patient not taking: Reported on 01/21/2014 11/19/13   Ambrose FinlandValerie A Keck, NP  HYDROcodone-acetaminophen (NORCO/VICODIN) 5-325 MG per tablet Take 1-2 tablets by mouth every 6 (six) hours as needed for moderate pain or severe pain. Patient not taking: Reported on 01/21/2014 11/10/13   Monte FantasiaJoseph W Mintz, PA-C  ibuprofen (ADVIL,MOTRIN) 800 MG tablet Take 1 tablet (800 mg total) by mouth 3 (three) times daily. 02/16/14   Riki SheerMichelle G Young, PA-C  ibuprofen (ADVIL,MOTRIN) 800 MG tablet Take 1 tablet (800 mg total) by mouth 3 (three) times daily. 02/16/14   Riki SheerMichelle G Young, PA-C  LORazepam (ATIVAN) 1 MG tablet Take 1 tablet (1 mg total) by mouth every 6 (six) hours as needed for anxiety. Patient not taking: Reported on 01/21/2014 12/01/13   Mora BellmanHannah S Merrell, PA-C  oxyCODONE-acetaminophen (PERCOCET/ROXICET) 5-325 MG per tablet Take 1 tablet by mouth every 4 (four) hours as needed. 02/23/14   Garlon HatchetLisa M Sanders, PA-C  traMADol (ULTRAM) 50 MG tablet Take 1 tablet (50 mg total) by mouth every 6 (six) hours as needed. Patient not taking: Reported on 01/21/2014 12/01/13   Mora BellmanHannah S Merrell, PA-C  traMADol (ULTRAM) 50 MG  tablet Take 1 tablet (50 mg total) by mouth every 8 (eight) hours as needed. 01/21/14   Ambrose Finland, NP   BP 113/66 mmHg  Pulse 86  Temp(Src) 98.4 F (36.9 C) (Oral)  Resp 16  SpO2 99% Physical Exam  Constitutional: He is oriented to person, place, and time. He appears well-developed and well-nourished. No distress.  HENT:  Head: Normocephalic and atraumatic.  Mouth/Throat: Oropharynx is clear and moist. No oropharyngeal exudate.  Eyes: Conjunctivae and EOM are normal. Pupils are equal, round, and reactive to light. Right eye exhibits  no discharge. Left eye exhibits no discharge.  Neck: Normal range of motion. Neck supple.  Cardiovascular: Normal rate, regular rhythm and intact distal pulses.   Pulmonary/Chest: Effort normal and breath sounds normal. No respiratory distress. He has no wheezes. He has no rales. He exhibits no tenderness.  Abdominal: Soft. Bowel sounds are normal. He exhibits no distension and no mass. There is no tenderness. There is no rebound and no guarding.  Musculoskeletal: He exhibits no edema.  Neurological: He is alert and oriented to person, place, and time. No cranial nerve deficit.  II-Visual fields grossly intact. III/IV/VI-Extraocular movements intact.  Pupils reactive bilaterally. V/VII-Smile symmetric, equal eyebrow raise,  facial sensation intact VIII- Hearing grossly intact IX/X-Normal gag XI-bilateral shoulder shrug XII-midline tongue extension Motor: 5/5 bilaterally with normal tone and bulk Cerebellar: Normal finger-to-nose  and normal heel-to-shin test.   Romberg negative Ambulates with a coordinated gait   Skin: Skin is warm and dry. No rash noted.  Psychiatric: He has a normal mood and affect. His behavior is normal.  Nursing note and vitals reviewed.   ED Course  Procedures  DIAGNOSTIC STUDIES: Oxygen Saturation is 99% on room air, normal by my interpretation.    COORDINATION OF CARE: 2:34 PM-Discussed treatment plan which includes medication with pt at bedside and pt agreed to plan.    Labs Review Labs Reviewed - No data to display  Imaging Review No results found.   EKG Interpretation None      MDM   Final diagnoses:  Anxiety    Filed Vitals:   02/27/14 1325 02/27/14 1632  BP: 113/66 111/64  Pulse: 86 78  Temp: 98.4 F (36.9 C) 98.6 F (37 C)  TempSrc: Oral Oral  Resp: 16 16  SpO2: 99% 99%    Medications  tetracaine (PONTOCAINE) 0.5 % ophthalmic solution 2 drop (2 drops Right Eye Given 02/27/14 1412)  fluorescein ophthalmic strip 1 strip (1  strip Both Eyes Given 02/27/14 1412)  LORazepam (ATIVAN) tablet 1 mg (1 mg Oral Given 02/27/14 1457)    Gabriel Ball is a pleasant 44 y.o. male presenting with paresthesia in the right face states that he gets this whenever he is stressed. Neuro exam is nonfocal, head CT is negative, we'll treat for anxiety exacerbation with Ativan.  Evaluation does not show pathology that would require ongoing emergent intervention or inpatient treatment. Pt is hemodynamically stable and mentating appropriately. Discussed findings and plan with patient/guardian, who agrees with care plan. All questions answered. Return precautions discussed and outpatient follow up given.  I personally performed the services described in this documentation, which was scribed in my presence. The recorded information has been reviewed and is accurate.    Wynetta Emery, PA-C 02/27/14 1643  Kia Stavros, PA-C 02/27/14 1643  Samuel Jester, DO 02/27/14 1940

## 2014-03-05 ENCOUNTER — Encounter (HOSPITAL_COMMUNITY): Payer: Self-pay | Admitting: Emergency Medicine

## 2014-03-05 ENCOUNTER — Emergency Department (HOSPITAL_COMMUNITY)
Admission: EM | Admit: 2014-03-05 | Discharge: 2014-03-05 | Discharge status: Absent without leave | Payer: No Typology Code available for payment source

## 2014-03-05 ENCOUNTER — Emergency Department (HOSPITAL_COMMUNITY)
Admission: EM | Admit: 2014-03-05 | Discharge: 2014-03-05 | Disposition: A | Discharge status: Home / Self Care | Payer: No Typology Code available for payment source | Source: Home / Self Care | Attending: Family Medicine | Admitting: Family Medicine

## 2014-03-05 DIAGNOSIS — H6121 Impacted cerumen, right ear: Secondary | ICD-10-CM

## 2014-03-05 DIAGNOSIS — G44209 Tension-type headache, unspecified, not intractable: Secondary | ICD-10-CM

## 2014-03-05 MED ORDER — TRAZODONE HCL 50 MG PO TABS: 50.0000 mg | ORAL_TABLET | Freq: Every day | ORAL | Status: DC

## 2014-03-05 NOTE — ED Notes (Signed)
C/o constant headache on right side x3 days [facility]; dx w/anxiety; having lots of family stress; father has been dx [place] Was given Lorazepam w/relief but has ran out.  Sx also include SOB Alert, no signs of acute distress.

## 2014-03-05 NOTE — ED Provider Notes (Signed)
CSN: [id number]     Arrival date & time 03/05/14  1724 History   First MD Initiated Contact with Patient 03/05/14 1851     No chief complaint on file.  (Consider location/radiation/quality/duration/timing/severity/associated sxs/prior Treatment) Patient is a 44 y.o. male presenting with headaches. The history is provided by the patient.  Headache Pain location:  R parietal and R temporal Quality:  Stabbing Radiates to:  Does not radiate Onset quality:  Gradual Duration:  3 days Progression:  Unchanged Chronicity:  New Context: emotional stress   Context comment:  [place] ill in [place]. Relieved by:  Prescription medications Associated symptoms: ear pain   Associated symptoms: no dizziness, no fever, no numbness, no paresthesias, no photophobia, no sinus pressure, no sore throat, no visual change and no vomiting     Past Medical History  Diagnosis Date  . Herpes   . Shingles    History reviewed. No pertinent past surgical history. Family History  Problem Relation Age of Onset  . Cancer Father    History  Substance Use Topics  . Smoking status: Former Games developermoker  . Smokeless tobacco: Not on file  . Alcohol Use: Yes    Review of Systems  Constitutional: Negative.  Negative for fever.  HENT: Positive for ear pain. Negative for sinus pressure and sore throat.   Eyes: Negative for photophobia.  Gastrointestinal: Negative for vomiting.  Neurological: Positive for headaches. Negative for dizziness, light-headedness, numbness and paresthesias.    Allergies  Penicillins and Amoxicillin  Home Medications   Prior to Admission medications   Medication Sig Start Date End Date Taking? Authorizing Provider  LORazepam (ATIVAN) 1 MG tablet Take 1 tablet (1 mg total) by mouth 3 (three) times daily as needed for anxiety. 02/27/14  Yes Nicole Pisciotta, PA-C  acyclovir (ZOVIRAX) 400 MG tablet Take 1 tablet (400 mg total) by mouth 3 (three) times daily. Patient not taking: Reported on  01/21/2014 11/19/13   Ambrose FinlandValerie A Keck, NP  HYDROcodone-acetaminophen (NORCO/VICODIN) 5-325 MG per tablet Take 1-2 tablets by mouth every 6 (six) hours as needed for moderate pain or severe pain. Patient not taking: Reported on 01/21/2014 11/10/13   Monte FantasiaJoseph W Mintz, PA-C  ibuprofen (ADVIL,MOTRIN) 800 MG tablet Take 1 tablet (800 mg total) by mouth 3 (three) times daily. 02/16/14   Riki SheerMichelle G Young, PA-C  ibuprofen (ADVIL,MOTRIN) 800 MG tablet Take 1 tablet (800 mg total) by mouth 3 (three) times daily. 02/16/14   Riki SheerMichelle G Young, PA-C  oxyCODONE-acetaminophen (PERCOCET/ROXICET) 5-325 MG per tablet Take 1 tablet by mouth every 4 (four) hours as needed. 02/23/14   Garlon HatchetLisa M Sanders, PA-C  traMADol (ULTRAM) 50 MG tablet Take 1 tablet (50 mg total) by mouth every 6 (six) hours as needed. Patient not taking: Reported on 01/21/2014 12/01/13   Mora BellmanHannah S Merrell, PA-C  traMADol (ULTRAM) 50 MG tablet Take 1 tablet (50 mg total) by mouth every 8 (eight) hours as needed. 01/21/14   Ambrose FinlandValerie A Keck, NP  traZODone (DESYREL) 50 MG tablet Take 1 tablet (50 mg total) by mouth at bedtime. 03/05/14   Linna HoffJames D Caeli Linehan, MD   BP 135/78 mmHg  Temp(Src) 99.5 F (37.5 C) (Oral)  Resp 20  SpO2 99% Physical Exam  Constitutional: He is oriented to person, place, and time. He appears well-developed and well-nourished.  HENT:  Head: Normocephalic.  Left Ear: Tympanic membrane, external ear and ear canal normal.  Ears:  Mouth/Throat: Oropharynx is clear and moist.  Eyes: Conjunctivae and EOM are normal. Pupils are  equal, round, and reactive to light.  Neck: Normal range of motion. Neck supple.  Lymphadenopathy:    He has no cervical adenopathy.  Neurological: He is alert and oriented to person, place, and time.  Skin: Skin is warm and dry.  Nursing note and vitals reviewed.   ED Course  Procedures (including critical care time) Labs Review Labs Reviewed - No data to display  Imaging Review No results found.   MDM   1.  Tension-type headache, not intractable, unspecified chronicity pattern   2. Cerumen impaction, right        Linna Hoff, MD 03/05/14 2048

## 2014-03-13 ENCOUNTER — Encounter (HOSPITAL_COMMUNITY): Payer: Self-pay | Admitting: *Deleted

## 2014-03-13 ENCOUNTER — Emergency Department (HOSPITAL_COMMUNITY)
Admission: EM | Admit: 2014-03-13 | Discharge: 2014-03-13 | Disposition: A | Discharge status: Home / Self Care | Payer: No Typology Code available for payment source | Source: Home / Self Care | Attending: Family Medicine | Admitting: Family Medicine

## 2014-03-13 ENCOUNTER — Telehealth: Payer: Self-pay | Admitting: Internal Medicine

## 2014-03-13 DIAGNOSIS — M792 Neuralgia and neuritis, unspecified: Secondary | ICD-10-CM

## 2014-03-13 DIAGNOSIS — G609 Hereditary and idiopathic neuropathy, unspecified: Secondary | ICD-10-CM

## 2014-03-13 MED ORDER — CLONAZEPAM 1 MG PO TABS: 1.0000 mg | ORAL_TABLET | Freq: Two times a day (BID) | ORAL | Status: DC

## 2014-03-13 NOTE — Telephone Encounter (Signed)
Patient called stating that his anxiety medication is not working, patient states that he is having bad anxiety and needs to see somebody. Please f/u with pt.

## 2014-03-13 NOTE — ED Notes (Addendum)
States he is having the same problem he had on 2/16 and the medicine we gave ( Trazadone ) is not working.  He sleeps a little bit for 3-4 hrs or sometimes none.  He has an appointment with Dr. Luna GlasgowKeck on Friday.   He called  the office today and [facility] and [facility] told him they don't give medicine, just talk to him. The Dr. Glenford PeersKeck's office said they were not going to change his medicine.

## 2014-03-13 NOTE — Discharge Instructions (Signed)
Take the trazodone and clonazepam together at bedtime. See neurologist if further problems.

## 2014-03-13 NOTE — ED Provider Notes (Signed)
CSN: [id number]     Arrival date & time 03/13/14  1641 History   First MD Initiated Contact with Patient 03/13/14 1806     Chief Complaint  Patient presents with  . Anxiety   (Consider location/radiation/quality/duration/timing/severity/associated sxs/prior Treatment) Patient is a 45 y.o. male presenting with anxiety. The history is provided by the patient and a relative. A language interpreter was used (son translated as before.).  Anxiety The current episode started more than 1 week ago. The problem has not changed since onset.Associated symptoms include headaches. Pertinent negatives include no chest pain and no shortness of breath. The symptoms are aggravated by stress.    Past Medical History  Diagnosis Date  . Herpes   . Shingles    History reviewed. No pertinent past surgical history. Family History  Problem Relation Age of Onset  . Cancer Father    History  Substance Use Topics  . Smoking status: Former Games developer  . Smokeless tobacco: Not on file  . Alcohol Use: Yes    Review of Systems  Constitutional: Negative.   Respiratory: Negative for shortness of breath.   Cardiovascular: Negative for chest pain.  Neurological: Positive for headaches. Negative for dizziness, facial asymmetry, weakness, light-headedness and numbness.    Allergies  Penicillins and Amoxicillin  Home Medications   Prior to Admission medications   Medication Sig Start Date End Date Taking? Authorizing Provider  ibuprofen (ADVIL,MOTRIN) 800 MG tablet Take 1 tablet (800 mg total) by mouth 3 (three) times daily. 02/16/14  Yes Riki Sheer, PA-C  traMADol (ULTRAM) 50 MG tablet Take 1 tablet (50 mg total) by mouth every 8 (eight) hours as needed. 01/21/14  Yes Ambrose Finland, NP  acyclovir (ZOVIRAX) 400 MG tablet Take 1 tablet (400 mg total) by mouth 3 (three) times daily. Patient not taking: Reported on 01/21/2014 11/19/13   Ambrose Finland, NP  clonazePAM (KLONOPIN) 1 MG tablet Take 1 tablet (1 mg  total) by mouth 2 (two) times daily. 03/13/14   Linna Hoff, MD  HYDROcodone-acetaminophen (NORCO/VICODIN) 5-325 MG per tablet Take 1-2 tablets by mouth every 6 (six) hours as needed for moderate pain or severe pain. Patient not taking: Reported on 01/21/2014 11/10/13   Monte Fantasia, PA-C  ibuprofen (ADVIL,MOTRIN) 800 MG tablet Take 1 tablet (800 mg total) by mouth 3 (three) times daily. 02/16/14   Riki Sheer, PA-C  LORazepam (ATIVAN) 1 MG tablet Take 1 tablet (1 mg total) by mouth 3 (three) times daily as needed for anxiety. 02/27/14   Nicole Pisciotta, PA-C  oxyCODONE-acetaminophen (PERCOCET/ROXICET) 5-325 MG per tablet Take 1 tablet by mouth every 4 (four) hours as needed. 02/23/14   Garlon Hatchet, PA-C  traMADol (ULTRAM) 50 MG tablet Take 1 tablet (50 mg total) by mouth every 6 (six) hours as needed. Patient not taking: Reported on 01/21/2014 12/01/13   Mora Bellman, PA-C  traZODone (DESYREL) 50 MG tablet Take 1 tablet (50 mg total) by mouth at bedtime. 03/05/14   Linna Hoff, MD   BP 106/68 mmHg  Pulse 81  Temp(Src) 98.1 F (36.7 C) (Oral)  Resp 18  SpO2 97% Physical Exam  Constitutional: He is oriented to person, place, and time. He appears well-developed and well-nourished.  HENT:  Head: Normocephalic and atraumatic.  Right Ear: External ear normal.  Left Ear: External ear normal.  Musculoskeletal: Normal range of motion.  Neurological: He is alert and oriented to person, place, and time. No cranial nerve deficit. Coordination normal.  Skin: Skin is warm and dry.  Nursing note and vitals reviewed.   ED Course  Procedures (including critical care time) Labs Review Labs Reviewed - No data to display  Imaging Review No results found.   MDM   1. Neuralgia involving scalp        Linna HoffJames D Kindl, MD 03/13/14 [phone]

## 2014-03-13 NOTE — Telephone Encounter (Signed)
Pt stated feeling like worms walking in his Rt side of his head.  Complaining of Anxiety and unable to sleep, medication not helping Was in the ER and had CT of the head done with normal results, per pt.  Advised to go to Outpatient Mental Health. Pt was given information. Appointment with PCP on Friday (Spoke with Pt in [place])

## 2014-03-15 ENCOUNTER — Ambulatory Visit: Payer: No Typology Code available for payment source | Attending: Internal Medicine | Admitting: Internal Medicine

## 2014-03-15 ENCOUNTER — Encounter: Payer: Self-pay | Admitting: Internal Medicine

## 2014-03-15 VITALS — BP 103/68 | HR 77 | Temp 98.6°F | Resp 16 | Ht 65.0 in | Wt 198.0 lb

## 2014-03-15 DIAGNOSIS — F419 Anxiety disorder, unspecified: Secondary | ICD-10-CM | POA: Insufficient documentation

## 2014-03-15 DIAGNOSIS — G4700 Insomnia, unspecified: Secondary | ICD-10-CM

## 2014-03-15 DIAGNOSIS — F32A Depression, unspecified: Secondary | ICD-10-CM

## 2014-03-15 DIAGNOSIS — F329 Major depressive disorder, single episode, unspecified: Secondary | ICD-10-CM

## 2014-03-15 MED ORDER — TRAZODONE HCL 50 MG PO TABS: 100.0000 mg | ORAL_TABLET | Freq: Every day | ORAL | Status: DC

## 2014-03-15 MED ORDER — TRAZODONE HCL 50 MG PO TABS
100.0000 mg | ORAL_TABLET | Freq: Every day | ORAL | Status: DC
Start: 2014-03-15 — End: 2017-03-23

## 2014-03-15 NOTE — Patient Instructions (Signed)
Trastorno de ansiedad generalizada (Generalized Anxiety Disorder) El trastorno de ansiedad generalizada es un trastorno mental. Interfiere en las funciones vitales, incluyendo las relaciones, el trabajo y la escuela.  Es diferente de la ansiedad normal que todas las personas experimentan en algn momento de su vida en respuesta a sucesos y actividades especficas. En verdad, la ansiedad normal nos ayuda a prepararnos y atravesar estos acontecimientos y actividades de la vida. La ansiedad normal desaparece despus de que el evento o la actividad ha finalizado.  El trastorno de ansiedad generalizada no est necesariamente relacionada con eventos o actividades especficas. Tambin causa un exceso de ansiedad en proporcin a sucesos o actividades especficas. En este trastorno la ansiedad es difcil de controlar. Los sntomas pueden variar de leves a muy graves. Las personas que sufren de trastorno de ansiedad generalizada pueden tener intensas olas de ansiedad con sntomas fsicos (ataques de pnico).  SNTOMAS  La ansiedad y la preocupacin asociada a este trastorno son difciles de controlar. Esta ansiedad y la preocupacin estn relacionados con muchos eventos de la vida y sus actividades y tambin ocurre durante ms das de los que no ocurre, durante 6 meses o ms. Las personas que la sufren pueden tener tres o ms de los siguientes sntomas (uno o ms en los nios):   Agitacin   Fatiga.  Dificultades de concentracin.   Irritabilidad.  Tensin muscular  Dificultad para dormirse o sueo poco satisfactorio. DIAGNSTICO  Se diagnostica a travs de una evaluacin realizada por el mdico. El mdico le har preguntas acerca de su estado de nimo, sntomas fsicos y sucesos de su vida. Le har preguntas sobre su historia clnica, el consumo de alcohol o drogas, incluyendo los medicamentos recetados. Tambin le har un examen fsico e indicar anlisis de sangre. Ciertas enfermedades y el uso de  determinadas sustancias pueden causar sntomas similares a este trastorno. Su mdico lo puede derivar a un especialista en salud mental para una evaluacin ms profunda..  TRATAMIENTO  Las terapias siguientes se utilizan en el tratamiento de este trastorno:   Medicamentos - Se recetan antidepresivos para el control diario a largo plazo. Pueden indicarse tambin medicamentos para combatir la ansiedad en los casos graves, especialmente cuando ocurren ataques de pnico.   Terapia conversada (psicoterapia) Ciertos tipos de psicoterapia pueden ser tiles en el tratamiento del trastorno de ansiedad generalizada, proporcionando apoyo, educacin y orientacin. Una forma de psicoterapia llamada terapia cognitivo-conductual puede ensearle formas saludables de pensar y reaccionar a los eventos y actividades de la vida diaria.  Tcnicasde manejo del estrs- Estas tcnicas incluyen el yoga, la meditacin y el ejercicio y pueden ser muy tiles cuando se practican con regularidad. Un especialista en salud mental puede ayudar a determinar qu tratamiento es mejor para usted. Algunas personas obtienen mejora con una terapia. Sin embargo, otras personas requieren una combinacin de terapias.  Document Released: 05/01/2012 Document Revised: 05/21/2013 Exit[employer] Patient Information 2015 [employer]. This information is not intended to replace advice given to you by your health care provider. Make sure you discuss any questions you have with your health care provider.  

## 2014-03-15 NOTE — Progress Notes (Signed)
Pt is here following up on his anxiety. Pt states that he has painful nerves on the right side of his face and head and ear.

## 2014-03-25 ENCOUNTER — Telehealth: Payer: Self-pay | Admitting: Internal Medicine

## 2014-03-25 NOTE — Telephone Encounter (Signed)
Pt is saying that he feels better now and is asking if its necessary to finish all his medications for clonazePAM (KLONOPIN) 1 MG tablet or is it okay to stop taking them. Please follow up with pt.

## 2014-03-26 NOTE — Telephone Encounter (Signed)
Pt is saying that he feels better now and is asking if its necessary to finish all his medications for clonazePAM (KLONOPIN) 1 MG tablet or is it okay to stop taking them. Please follow up with pt.              Please f/u with pt

## 2014-04-02 DIAGNOSIS — F419 Anxiety disorder, unspecified: Secondary | ICD-10-CM | POA: Insufficient documentation

## 2014-04-02 NOTE — Progress Notes (Signed)
   Subjective:    Patient ID: Gabriel Ball, male    DOB: [birth year], 44 y.o.   MRN: [id number]  Anxiety Presents for follow-up visit. Onset was 1 to 6 months ago. The problem has been unchanged. Symptoms include excessive worry, insomnia, nervous/anxious behavior and restlessness. Patient reports no suicidal ideas. Primary symptoms comment: tingling of face. Symptoms occur most days. The severity of symptoms is mild. The symptoms are aggravated by family issues. The quality of sleep is non-restorative.   His past medical history is significant for depression. There is no history of bipolar disorder, hyperthyroidism or suicide attempts. Past treatments include lifestyle changes and benzodiazephines. The treatment provided mild relief.     Review of Systems  Neurological: Positive for numbness and headaches. Negative for weakness.  Psychiatric/Behavioral: Negative for suicidal ideas. The patient is nervous/anxious and has insomnia.   All other systems reviewed and are negative.     Objective:   Physical Exam  Constitutional: He is oriented to person, place, and time.  Neck: No thyromegaly present.  Cardiovascular: Normal rate, regular rhythm and normal heart sounds.   Pulmonary/Chest: Effort normal and breath sounds normal.  Neurological: He is alert and oriented to person, place, and time. No cranial nerve deficit.      Assessment & Plan:  Gabriel Ball was seen today for follow-up.  Diagnoses and all orders for this visit:  Depression/Insomnia/Anxiety Orders: -     traZODone (DESYREL) 50 MG tablet; Take 2 tablets (100 mg total) by mouth at bedtime. Explained to patient the trazodone will assist with depression and insomnia. Increase dose to 100 mg because patient was previously sewn 50 mg that did not help. Plan is to get patient off benzos by increasing dose of trazodone. If plan is ineffective may add on SSRI on next exam.  Due to language barrier, an interpreter was present during the  history-taking and subsequent discussion (and for part of the physical exam) with this patient.  Return in about 2 weeks (around 03/29/2014) for f/u anxiety.  Gabriel Ball, Keyani Rigdon, NP 04/02/2014 11:18 AM

## 2014-04-04 ENCOUNTER — Telehealth: Payer: Self-pay | Admitting: Emergency Medicine

## 2014-04-04 NOTE — Telephone Encounter (Signed)
Left message pt may stop taking Clonazepam if he feels better, and continue Trazadone for sleep and depression per Vikki PortsValerie, NP [employer] interpretor ID# [phone]

## 2014-05-19 ENCOUNTER — Other Ambulatory Visit: Payer: Self-pay | Admitting: Internal Medicine

## 2014-07-27 ENCOUNTER — Encounter (HOSPITAL_COMMUNITY): Payer: Self-pay | Admitting: *Deleted

## 2014-07-27 ENCOUNTER — Emergency Department (INDEPENDENT_AMBULATORY_CARE_PROVIDER_SITE_OTHER)
Admission: EM | Admit: 2014-07-27 | Discharge: 2014-07-27 | Disposition: A | Discharge status: Home / Self Care | Payer: Self-pay | Source: Home / Self Care | Attending: Family Medicine | Admitting: Family Medicine

## 2014-07-27 DIAGNOSIS — M7652 Patellar tendinitis, left knee: Secondary | ICD-10-CM

## 2014-07-27 MED ORDER — DICLOFENAC POTASSIUM 50 MG PO TABS: 50.0000 mg | ORAL_TABLET | Freq: Three times a day (TID) | ORAL | Status: DC

## 2014-07-27 NOTE — Discharge Instructions (Signed)
Wear knee pads at work, use ice pack and medicine as needed for soreness. See your doctor if further problems.

## 2014-07-27 NOTE — ED Notes (Signed)
Pt  Reports  Pain  In both  Knees   -  The  Left  Is  Worse    Than the  Right     He reports  The  Pain is  Worse  When he   Is  On his knees   Working         Pt  Reports  The    Symptoms  X  1  Month        Denies   A  specefic  injury

## 2014-07-27 NOTE — ED Provider Notes (Signed)
CSN: [id number]     Arrival date & time 07/27/14  1300 History   First MD Initiated Contact with Patient 07/27/14 1320     Chief Complaint  Patient presents with  . Knee Pain   (Consider location/radiation/quality/duration/timing/severity/associated sxs/prior Treatment) Patient is a 44 y.o. male presenting with knee pain. The history is provided by the patient.  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 Herpes   . Shingles    History reviewed. No pertinent past surgical history. Family History  Problem Relation Age of Onset  . Cancer Father    History  Substance Use Topics  . Smoking status: Former Games developermoker  . Smokeless tobacco: Not on file  . Alcohol Use: Yes    Review of Systems  Constitutional: Negative.   Musculoskeletal: Negative for joint swelling and gait problem.  Skin: Negative for rash and wound.    Allergies  Penicillins and Amoxicillin  Home Medications   Prior to Admission medications   Medication Sig Start Date End Date Taking? Authorizing Provider  acyclovir (ZOVIRAX) 400 MG tablet Take 1 tablet (400 mg total) by mouth 3 (three) times daily. Patient not taking: Reported on 01/21/2014 11/19/13   Ambrose FinlandValerie A Keck, NP  clonazePAM (KLONOPIN) 1 MG tablet Take 1 tablet (1 mg total) by mouth 2 (two) times daily. 03/13/14   Linna HoffJames D Kindl, MD  diclofenac (CATAFLAM) 50 MG tablet Take 1 tablet (50 mg total) by mouth 3 (three) times daily. For knee pain 07/27/14   Linna HoffJames D Kindl, MD  ibuprofen (ADVIL,MOTRIN) 800 MG tablet Take 1 tablet (800 mg total) by mouth 3 (three) times daily. Patient not taking: Reported on 03/15/2014 02/16/14   Riki SheerMichelle  G Young, PA-C  traZODone (DESYREL) 50 MG tablet Take 2 tablets (100 mg total) by mouth at bedtime. 03/15/14   Ambrose FinlandValerie A Keck, NP   BP 113/72 mmHg  Pulse 81  Temp(Src) 98.8 F (37.1 C) (Oral)  Resp 16  SpO2 97% Physical Exam  Constitutional: He is oriented to person, place, and time. He appears well-developed and well-nourished.  Musculoskeletal: Normal range of motion. He exhibits tenderness.       Left knee: He exhibits normal range of motion, no swelling, no effusion, no erythema and normal patellar mobility. Tenderness found. Patellar tendon tenderness noted.  Neurological: He is alert and oriented to person, place, and time.  Skin: Skin is warm and dry.  Nursing note and vitals reviewed.   ED Course  Procedures (including critical care time) Labs Review Labs Reviewed - No data to display  Imaging Review No results found.   MDM   1. Patellar tendonitis of left knee       Linna HoffJames D Kindl, MD 07/27/14 1400

## 2014-08-02 ENCOUNTER — Emergency Department (INDEPENDENT_AMBULATORY_CARE_PROVIDER_SITE_OTHER)
Admission: EM | Admit: 2014-08-02 | Discharge: 2014-08-02 | Disposition: A | Discharge status: Home / Self Care | Payer: Self-pay | Source: Home / Self Care

## 2014-08-02 ENCOUNTER — Encounter (HOSPITAL_COMMUNITY): Payer: Self-pay | Admitting: Emergency Medicine

## 2014-08-02 DIAGNOSIS — B002 Herpesviral gingivostomatitis and pharyngotonsillitis: Secondary | ICD-10-CM

## 2014-08-02 DIAGNOSIS — M792 Neuralgia and neuritis, unspecified: Secondary | ICD-10-CM

## 2014-08-02 MED ORDER — ACYCLOVIR 800 MG PO TABS: 800.0000 mg | ORAL_TABLET | Freq: Every day | ORAL | Status: DC

## 2014-08-02 NOTE — ED Notes (Signed)
C/o right side facial numbness onset Monday Seen here on 7/9 for left knee pain Alert, no signs of acute distress.

## 2014-08-02 NOTE — Discharge Instructions (Signed)
THe cause of your pain is not immediately clear but may be due to a shingles infection or nerve injury pain Please follow up with your regular doctor. Please start taking the Acyclovir Please got to the ER if you get worse Please discuss taking Carbamazepine with your regular doctor and consider going to a neurologist. .    Gabriel PersiaLa causa de su dolor no est claro , pero puede ser debido a una infeccin por herpes zster o dolor lesin del nervio Por favor, el seguimiento con su mdico de cabecera . Por favor, empezar a tomar el aciclovir Por favor, llegamos a la sala de emergencias si empeoran Por favor, hable tomando carbamazepina con su mdico de cabecera y considerar ir a un neurlogo . .Marland Kitchen

## 2014-08-02 NOTE — ED Provider Notes (Signed)
CSN: [id number]     Arrival date & time 08/02/14  1751 History   None    Chief Complaint  Patient presents with  . Numbness   (Consider location/radiation/quality/duration/timing/severity/associated sxs/prior Treatment) HPI  Son acting as interpreter.   R sided head pain: started 3 days ago. Constant. Taking voltaren for knee pain but this made his stomach hurt adn dd not help the head. Blurry vision in R eye during this time.  Patient states that this is the same head pain and I pain that he has had for years. States he went to the neurologist for this condition in the past but was told that he was "crazy." They provided no additional medications for him at this time. Patient has had multiple different treatments for this in the past by multiple providers and states that the one that treated a nerve infection is the only one that has worked. Denies any syncope, neck stiffness, headache, head trauma, chest pain, shortness of breath, palpitations, rash. Patient does have a history of cold sores, from time to time but no active lesions at this point time.  Past Medical History  Diagnosis Date  . Herpes   . Shingles    History reviewed. No pertinent past surgical history. Family History  Problem Relation Age of Onset  . Cancer Father    History  Substance Use Topics  . Smoking status: Former Games developermoker  . Smokeless tobacco: Not on file  . Alcohol Use: Yes    Review of Systems Per HPI with all other pertinent systems negative.   Allergies  Penicillins and Amoxicillin  Home Medications   Prior to Admission medications   Medication Sig Start Date End Date Taking? Authorizing Provider  acyclovir (ZOVIRAX) 800 MG tablet Take 1 tablet (800 mg total) by mouth 5 (five) times daily. 08/02/14   Ozella Rocksavid J Jacqualin Shirkey, MD  clonazePAM (KLONOPIN) 1 MG tablet Take 1 tablet (1 mg total) by mouth 2 (two) times daily. 03/13/14   Linna HoffJames D Kindl, MD  diclofenac (CATAFLAM) 50 MG tablet Take 1 tablet (50 mg  total) by mouth 3 (three) times daily. For knee pain 07/27/14   Linna HoffJames D Kindl, MD  ibuprofen (ADVIL,MOTRIN) 800 MG tablet Take 1 tablet (800 mg total) by mouth 3 (three) times daily. Patient not taking: Reported on 03/15/2014 02/16/14   Riki SheerMichelle G Young, PA-C  traZODone (DESYREL) 50 MG tablet Take 2 tablets (100 mg total) by mouth at bedtime. 03/15/14   Ambrose FinlandValerie A Keck, NP   BP 103/66 mmHg  Pulse 64  Temp(Src) 98 F (36.7 C) (Oral)  Resp 16  SpO2 99% Physical Exam Physical Exam  Constitutional: oriented to person, place, and time. appears well-developed and well-nourished. No distress.  HENT:  Head: Normocephalic and atraumatic.  Eyes: EOMI. PERRL.  visual fields intact Neck: Normal range of motion.  Cardiovascular: RRR, no m/r/g, 2+ distal pulses,  Pulmonary/Chest: Effort normal and breath sounds normal. No respiratory distress.  Abdominal: Soft. Bowel sounds are normal. NonTTP, no distension.  Musculoskeletal: Normal range of motion. Non ttp, no effusion.  Neurological: alert and oriented to person, place, and time.  Skin: Skin is warm. No rash noted. non diaphoretic.  Psychiatric: normal mood and affect. behavior is normal. Judgment and thought content normal.   ED Course  Procedures (including critical care time) Labs Review Labs Reviewed - No data to display  Imaging Review No results found.   MDM   1. Neuralgia   2. Recurrent oral herpes simplex  Etiology not immediately clear. No need for emergent ED visit S patient has had extensive workup for this in the past including CT imaging in multiple different medical regimens. No need for ophthalmology consult at this time his vision is still preserved. This is more of a perceived deficit as opposed to a true deficit and this is very common for this patient as he states that the symptoms coming on a regular basis. Will treat with acyclovir at this point time as patient does have a history of possible shingles.   This is true  trigeminal neuralgia would suspect patient would benefit from carbamazepine. Discussed further patients need to follow-up with primary care physician and get on a regular medical regimen for this condition as well as follow-up with neurology and maybe see a different neurologist this time.    Ozella Rocks, MD 08/02/14 [phone]

## 2014-08-04 ENCOUNTER — Emergency Department (HOSPITAL_COMMUNITY)
Admission: EM | Admit: 2014-08-04 | Discharge: 2014-08-04 | Disposition: A | Discharge status: Home / Self Care | Payer: Self-pay | Attending: Emergency Medicine | Admitting: Emergency Medicine

## 2014-08-04 ENCOUNTER — Encounter (HOSPITAL_COMMUNITY): Payer: Self-pay | Admitting: *Deleted

## 2014-08-04 DIAGNOSIS — K029 Dental caries, unspecified: Secondary | ICD-10-CM | POA: Insufficient documentation

## 2014-08-04 DIAGNOSIS — Z880 Allergy status to penicillin: Secondary | ICD-10-CM | POA: Insufficient documentation

## 2014-08-04 DIAGNOSIS — G4763 Sleep related bruxism: Secondary | ICD-10-CM | POA: Insufficient documentation

## 2014-08-04 DIAGNOSIS — R600 Localized edema: Secondary | ICD-10-CM | POA: Insufficient documentation

## 2014-08-04 DIAGNOSIS — F458 Other somatoform disorders: Secondary | ICD-10-CM

## 2014-08-04 DIAGNOSIS — Z87891 Personal history of nicotine dependence: Secondary | ICD-10-CM | POA: Insufficient documentation

## 2014-08-04 DIAGNOSIS — Z79899 Other long term (current) drug therapy: Secondary | ICD-10-CM | POA: Insufficient documentation

## 2014-08-04 DIAGNOSIS — Z8619 Personal history of other infectious and parasitic diseases: Secondary | ICD-10-CM | POA: Insufficient documentation

## 2014-08-04 DIAGNOSIS — R220 Localized swelling, mass and lump, head: Secondary | ICD-10-CM

## 2014-08-04 DIAGNOSIS — Z791 Long term (current) use of non-steroidal anti-inflammatories (NSAID): Secondary | ICD-10-CM | POA: Insufficient documentation

## 2014-08-04 MED ORDER — KETOROLAC TROMETHAMINE 60 MG/2ML IM SOLN
60.0000 mg | Freq: Once | INTRAMUSCULAR | Status: AC
  Administered 2014-08-04: 60 mg via INTRAMUSCULAR
  Filled 2014-08-04: qty 2

## 2014-08-04 MED ORDER — IBUPROFEN 600 MG PO TABS: 600.0000 mg | ORAL_TABLET | Freq: Four times a day (QID) | ORAL | Status: DC | PRN

## 2014-08-04 MED ORDER — DIAZEPAM 2 MG PO TABS: 2.0000 mg | ORAL_TABLET | Freq: Four times a day (QID) | ORAL | Status: DC | PRN

## 2014-08-04 NOTE — ED Provider Notes (Signed)
CSN: [id number]     Arrival date & time 08/04/14  7829 History   First MD Initiated Contact with Patient 08/04/14 [phone]     Chief Complaint  Patient presents with  . Jaw Pain      (Consider location/radiation/quality/duration/timing/severity/associated sxs/prior Treatment) HPI Comments: Pt. Is a 44 y/o M here with history of chronic right cheek pain over the past 5 months in the setting of having multiple infections of his teeth on the right requiring antibiotic treatment. He has also required multiple teeth to be removed in his upper right jaw 6 months ago. He describes the sensation as stiffness / pain of the right cheek. He denies pain with eating, difficulty swallowing, fever, chills, nausea, or vomiting. He denies drooling or difficulty closing his mouth. He says he has tried NSAID's with little relief, and that Valium has helped him the most. He does admit to grinding his teeth at night chronically, and he says that this has been going on for several years now. He has not pain of the side of his head or temple. No rashes. No pain in the back of his head or aching. He says he has had some chronic vision changes, and that the vision in his left eye is worse than his right. He is following up with Opthalmology. He has no tenderness to palpation of his cheek, and he denies numbness. He has no significant PMH.   The history is provided by the patient. The history is limited by a language barrier. A language interpreter was used.    Past Medical History  Diagnosis Date  . Herpes   . Shingles    History reviewed. No pertinent past surgical history. Family History  Problem Relation Age of Onset  . Cancer Father    History  Substance Use Topics  . Smoking status: Former Games developer  . Smokeless tobacco: Not on file  . Alcohol Use: No    Review of Systems  Constitutional: Negative for fever, chills, diaphoresis, activity change and appetite change.  HENT: Positive for dental problem and  facial swelling. Negative for congestion, drooling, ear discharge, ear pain, hearing loss, mouth sores, nosebleeds, postnasal drip, rhinorrhea, sinus pressure, sneezing, sore throat, tinnitus, trouble swallowing and voice change.   Eyes: Negative.  Negative for pain, discharge, redness and visual disturbance.  Respiratory: Negative for cough, choking, chest tightness, shortness of breath and stridor.   Cardiovascular: Negative.   Gastrointestinal: Negative.  Negative for nausea and vomiting.  Endocrine: Negative.   Genitourinary: Negative.   Musculoskeletal: Negative.  Negative for neck pain and neck stiffness.  Skin: Negative.  Negative for pallor and rash.  Allergic/Immunologic: Negative.  Negative for food allergies.  Neurological: Negative for dizziness, syncope, weakness, light-headedness, numbness and headaches.  Hematological: Negative.   Psychiatric/Behavioral: Negative.  Negative for behavioral problems and agitation.      Allergies  Penicillins and Amoxicillin  Home Medications   Prior to Admission medications   Medication Sig Start Date End Date Taking? Authorizing Provider  acyclovir (ZOVIRAX) 800 MG tablet Take 1 tablet (800 mg total) by mouth 5 (five) times daily. 08/02/14   Ozella Rocks, MD  clonazePAM (KLONOPIN) 1 MG tablet Take 1 tablet (1 mg total) by mouth 2 (two) times daily. 03/13/14   Linna Hoff, MD  diazepam (VALIUM) 2 MG tablet Take 1 tablet (2 mg total) by mouth every 6 (six) hours as needed for muscle spasms. 08/04/14   Yolande Jolly, MD  diclofenac (CATAFLAM) 50 MG  tablet Take 1 tablet (50 mg total) by mouth 3 (three) times daily. For knee pain 07/27/14   Linna HoffJames D Kindl, MD  ibuprofen (ADVIL,MOTRIN) 600 MG tablet Take 1 tablet (600 mg total) by mouth every 6 (six) hours as needed. 08/04/14   Yolande Jollyaleb G Reeve Mallo, MD  traZODone (DESYREL) 50 MG tablet Take 2 tablets (100 mg total) by mouth at bedtime. 03/15/14   Ambrose FinlandValerie A Keck, NP   BP 109/61 mmHg  Pulse 58   Temp(Src) 98 F (36.7 C) (Oral)  Resp 18  SpO2 98% Physical Exam  Constitutional: He is oriented to person, place, and time. He appears well-developed and well-nourished. No distress.  HENT:  Head: Normocephalic and atraumatic. Head is without abrasion and without contusion.  Right Ear: External ear normal.  Left Ear: External ear normal.  Nose: Nose normal. No rhinorrhea or sinus tenderness. No epistaxis. Right sinus exhibits no maxillary sinus tenderness and no frontal sinus tenderness. Left sinus exhibits no maxillary sinus tenderness and no frontal sinus tenderness.  Mouth/Throat: Uvula is midline, oropharynx is clear and moist and mucous membranes are normal. Mucous membranes are not pale and not dry. He does not have dentures. No oral lesions. No trismus in the jaw. Abnormal dentition. Dental caries present. No dental abscesses, uvula swelling or lacerations. No oropharyngeal exudate, posterior oropharyngeal edema, posterior oropharyngeal erythema or tonsillar abscesses.    Eyes: Conjunctivae and EOM are normal. Pupils are equal, round, and reactive to light. Right eye exhibits no discharge. Left eye exhibits no discharge.  Neck: Normal range of motion. Neck supple. No thyromegaly present.  Cardiovascular: Normal rate, regular rhythm and normal heart sounds.  Exam reveals no gallop and no friction rub.   No murmur heard. Pulmonary/Chest: Effort normal and breath sounds normal. No stridor. He has no wheezes. He has no rales.  Abdominal: Soft. Bowel sounds are normal. He exhibits no distension. There is no tenderness. There is no rebound and no guarding.  Musculoskeletal: Normal range of motion. He exhibits no edema or tenderness.  No scalp TTP, or Temporal TTP. TMJ without clicking or pain with palpation.   Lymphadenopathy:    He has no cervical adenopathy.  Neurological: He is alert and oriented to person, place, and time. He displays normal reflexes. No cranial nerve deficit.  Coordination normal.  Skin: Skin is warm and dry. No rash noted. He is not diaphoretic. No erythema.  Psychiatric: He has a normal mood and affect. His behavior is normal.    ED Course  Procedures (including critical care time) Labs Review Labs Reviewed - No data to display  Imaging Review No results found.   EKG Interpretation None      MDM   Final diagnoses:  Bruxism (teeth grinding)  Cheek swelling    Pt. Is a 44 y/o M here with jaw pain on the right that has been ongoing for some time in the setting of multiple right sided tooth infections and removal. He has also been grinding his teeth for years. Likely a combination of the above resulting in Trigeminal irritation as well as jaw / mucosal inflammation from teeth grinding. Was given valium before with some relief. Will give a few to make it to his PCP. Will also likely need mouth guard at night to help with his pain. Will give Motrin for inflammation. Close follow up with PCP within the next week if possible. Safe for discharge with close follow up.        Yolande Jollyaleb G Gwen Edler, MD  08/04/14 1012  Nelva Nay, MD 08/04/14 1014

## 2014-08-04 NOTE — ED Notes (Signed)
C/o pain in his right jaw onset 8 days ago . States he was seen at urgent care and was given valium of which helped [facility] and was given Cataflam which isn't helping,.Has also had problems with tooth pain in the past

## 2014-08-04 NOTE — Discharge Instructions (Signed)
Teeth Grinding (Bruxism)  Many people grind or clench their teeth during the day or night. Most are unaware they are doing it. Teeth grinding may be triggered by teeth being improperly aligned (malocclusion). Other causes may be:  · Stress.  · Worry.  · Sleep disorders.  Teeth grinding sometimes causes muscle spasms and headaches. Most often, these headaches are classified as tension type. Teeth grinding may also lead to:  · Jaw problems (temporomandibular joint disorder [TMJ]).  · Facial pain.  · Damaged teeth.  Teeth grinding is quite common in children, usually without pain. Most of the time it will disappear by adolescence.  TREATMENT   Seek dental care if you or your child has:  · Pain.  · Trouble when opening the mouth.  · Damage to teeth.  A dentist often can ease those symptoms by fitting a small splint or mouth guard (appliance) to the upper or lower teeth. It is usually worn during sleep. Medications and other stress reduction techniques are sometimes used.  Document Released: 01/07/2003 Document Revised: 03/29/2011 Document Reviewed: 04/02/2009  ExitCare® Patient Information ©2015 [employer]. This information is not intended to replace advice given to you by your health care provider. Make sure you discuss any questions you have with your health care provider.

## 2014-08-13 ENCOUNTER — Ambulatory Visit: Payer: Self-pay | Attending: Internal Medicine | Admitting: Internal Medicine

## 2014-08-13 ENCOUNTER — Encounter: Payer: Self-pay | Admitting: Internal Medicine

## 2014-08-13 VITALS — BP 118/72 | HR 82 | Temp 98.5°F | Resp 18 | Ht 65.0 in | Wt 195.0 lb

## 2014-08-13 DIAGNOSIS — R51 Headache: Secondary | ICD-10-CM | POA: Insufficient documentation

## 2014-08-13 DIAGNOSIS — Z79899 Other long term (current) drug therapy: Secondary | ICD-10-CM | POA: Insufficient documentation

## 2014-08-13 DIAGNOSIS — F419 Anxiety disorder, unspecified: Secondary | ICD-10-CM | POA: Insufficient documentation

## 2014-08-13 DIAGNOSIS — F458 Other somatoform disorders: Secondary | ICD-10-CM | POA: Insufficient documentation

## 2014-08-13 DIAGNOSIS — Z87891 Personal history of nicotine dependence: Secondary | ICD-10-CM | POA: Insufficient documentation

## 2014-08-13 DIAGNOSIS — R6884 Jaw pain: Secondary | ICD-10-CM | POA: Insufficient documentation

## 2014-08-13 NOTE — Progress Notes (Signed)
Patient ID: Gabriel Ball, male   DOB: 1970/03/11, 44 y.o.   MRN: [id number]  CC: anxiety, jaw pain   HPI: Gabriel Ball is a 44 y.o. male here today for a follow up visit.  Patient has past medical history of anxiety and herpes. Patient reports that he went to the Urgent Care "for my nerves breaking down, they didn't give me any medicine". He later went to the ER 2 days later and was given Diazepam. He was recommended to come see his PCP. He reports that he completed the diazepam 3 days ago but reports that it took 4 days before he noticed a difference. He continues to c/o right sided head pain and right cheek pain. He reports that the right cheek/jaw gets tight.  He denies pain today and believes that he has gum infection again. He has a scheduled Dentist appointment next week.   Patient has No chest pain, No abdominal pain - No Nausea, No new weakness tingling or numbness, No Cough - SOB.  Allergies  Allergen Reactions  . Penicillins   . Amoxicillin Itching    Pruritic rash   Past Medical History  Diagnosis Date  . Herpes   . Shingles    Current Outpatient Prescriptions on File Prior to Visit  Medication Sig Dispense Refill  . ibuprofen (ADVIL,MOTRIN) 600 MG tablet Take 1 tablet (600 mg total) by mouth every 6 (six) hours as needed. 30 tablet 0  . acyclovir (ZOVIRAX) 800 MG tablet Take 1 tablet (800 mg total) by mouth 5 (five) times daily. (Patient not taking: Reported on 08/13/2014) 35 tablet 0  . clonazePAM (KLONOPIN) 1 MG tablet Take 1 tablet (1 mg total) by mouth 2 (two) times daily. (Patient not taking: Reported on 08/13/2014) 30 tablet 0  . diazepam (VALIUM) 2 MG tablet Take 1 tablet (2 mg total) by mouth every 6 (six) hours as needed for muscle spasms. (Patient not taking: Reported on 08/13/2014) 20 tablet 0  . diclofenac (CATAFLAM) 50 MG tablet Take 1 tablet (50 mg total) by mouth 3 (three) times daily. For knee pain (Patient not taking: Reported on 08/13/2014) 30 tablet 0  . traZODone  (DESYREL) 50 MG tablet Take 2 tablets (100 mg total) by mouth at bedtime. (Patient not taking: Reported on 08/13/2014) 60 tablet 1   No current facility-administered medications on file prior to visit.   Family History  Problem Relation Age of Onset  . Cancer Father    History   Social History  . Marital Status: Single    Spouse Name: N/A  . Number of Children: N/A  . Years of Education: N/A   Occupational History  . Not on file.   Social History Main Topics  . Smoking status: Former Games developer  . Smokeless tobacco: Not on file  . Alcohol Use: No  . Drug Use: No  . Sexual Activity: Not on file   Other Topics Concern  . Not on file   Social History Narrative    Review of Systems  HENT:       Teeth grinding, gum pain  Eyes: Positive for blurred vision.  Neurological: Positive for headaches.  Psychiatric/Behavioral: Negative for depression and suicidal ideas. The patient is nervous/anxious.   All other systems reviewed and are negative.   Objective:   Filed Vitals:   08/13/14 1707  BP: 118/72  Pulse: 82  Temp: 98.5 F (36.9 C)  Resp: 18    Physical Exam  Constitutional: He is oriented to person, place, and  time.  HENT:  TMJ tenderness Gums appear normal   Eyes: EOM are normal. Pupils are equal, round, and reactive to light.  Neurological: He is alert and oriented to person, place, and time. No cranial nerve deficit.      Lab Results  Component Value Date   WBC 5.8 02/23/2014   HGB 14.3 02/23/2014   HCT 40.3 02/23/2014   MCV 80.8 02/23/2014   PLT 198 02/23/2014   Lab Results  Component Value Date   CREATININE 0.60 02/23/2014   BUN 10 02/23/2014   NA 140 02/23/2014   K 3.6 02/23/2014   CL 109 02/23/2014   CO2 28 02/23/2014    No results found for: HGBA1C Lipid Panel  No results found for: CHOL, TRIG, HDL, CHOLHDL, VLDL, LDLCALC     Assessment and plan:   Cora was seen today for jaw pain.  Diagnoses and all orders for this  visit:  Anxiety I have explained to patient that I will not refill his benzo. I have advised him to seek counseling with [facility] [place]. He will then have counseling and possible med management   Bruxism (teeth grinding) I have explained that teeth grinding may cause irritation to trigeminal nerve which may also cause headache and jaw pain. I have advised patient to get a mouth guard to sleep in. He may take ibuprofen to help with pain and inflammation. Patient verbalized understanding.   Due to language barrier, an interpreter was present during the history-taking and subsequent discussion (and for part of the physical exam) with this patient.   Return if symptoms worsen or fail to improve.      Ambrose Finland, NP-C [facility] [phone] 08/13/2014, 5:26 PM

## 2014-08-13 NOTE — Progress Notes (Signed)
Patient went to clinic next door, "for my nerves breaking down", they didn't give me any medicine, patient went to ED and they gave patient shot and diazepam 2 mg. ED recommended patient to see PCP.   Patient finished diazepam 3 days ago. Diazepam took 4 days before patient "started feeling it".    Vikki Ports gave patient something that dissolves under patients tongue, they work "right away".  Patient has pain in right side of head and sometimes right cheek to jaw gets thick.   Patient denies pain at this time. Patient reports having a gum infection.   GAD 7 score 11

## 2014-08-13 NOTE — Patient Instructions (Signed)
Nighttime mouth guard for teeth grinding---may pick up at local pharmacy or [facility]. Ask pharmacist to help you

## 2014-10-17 ENCOUNTER — Emergency Department (HOSPITAL_COMMUNITY)
Admission: EM | Admit: 2014-10-17 | Discharge: 2014-10-17 | Disposition: A | Discharge status: Home / Self Care | Payer: Self-pay | Attending: Physician Assistant | Admitting: Physician Assistant

## 2014-10-17 ENCOUNTER — Encounter (HOSPITAL_COMMUNITY): Payer: Self-pay | Admitting: Emergency Medicine

## 2014-10-17 DIAGNOSIS — Z87891 Personal history of nicotine dependence: Secondary | ICD-10-CM | POA: Insufficient documentation

## 2014-10-17 DIAGNOSIS — B353 Tinea pedis: Secondary | ICD-10-CM | POA: Insufficient documentation

## 2014-10-17 DIAGNOSIS — Z880 Allergy status to penicillin: Secondary | ICD-10-CM | POA: Insufficient documentation

## 2014-10-17 MED ORDER — TERBINAFINE HCL 1 % EX CREA: 1.0000 "application " | TOPICAL_CREAM | Freq: Two times a day (BID) | CUTANEOUS | Status: DC

## 2014-10-17 NOTE — ED Notes (Signed)
Interpreter phone used for discharge instructions; pt verbalized understanding

## 2014-10-17 NOTE — ED Notes (Signed)
The patient says his feet have been burning and sweaty for about two weeks.  The patient said he has tried everything from vinegar to salt soaks, and aloe vera and nothing is working.  He denies pain or any other symptoms.

## 2014-10-17 NOTE — ED Provider Notes (Signed)
CSN: [id number]     Arrival date & time 10/17/14  1834 History  By signing my name below, I, Gabriel Ball, attest that this documentation has been prepared under the direction and in the presence of Gabriel Morn, NP.  Electronically Signed: [name], ED Scribe. 10/17/2014. 7:02 PM.    Chief Complaint  Patient presents with  . Foot Pain    The patient says his feet have been burning and sweaty for about two weeks.  The patient said he has tried everything from vinegar to salt soaks, and aloe vera and nothing is working.   Patient is a 44 y.o. male presenting with lower extremity pain. The history is provided by the patient and a relative. No language interpreter was used.  Foot Pain This is a new problem. The current episode started more than 1 week ago. The problem occurs constantly. The problem has not changed since onset.Nothing aggravates the symptoms. Nothing relieves the symptoms. Treatments tried: foot powder, synacthen  The treatment provided no relief.    HPI Comments: Gabriel Ball is a 44 y.o. male who presents to the Emergency Department complaining of moderate burning pain between the toes to bilateral feet onset 2 weeks ago with associated increased sweating. Pt notes that he also has a fungus to his toenails and has not been seen by a podiatrist for these symptoms. He states he has tried [employer] foot powder, Synacthen with no relief. PCP is Dr. Luna Glasgow. No hx of HTN, renal failure. He denies any other symptoms.   Past Medical History  Diagnosis Date  . Herpes   . Shingles    History reviewed. No pertinent past surgical history. Family History  Problem Relation Age of Onset  . Cancer Father    Social History  Substance Use Topics  . Smoking status: Former Games developer  . Smokeless tobacco: None  . Alcohol Use: No    Review of Systems  Skin:       +burning foot pain between the toes, increased sweating  All other systems reviewed and are negative.  Allergies  Penicillins  and Amoxicillin  Home Medications   Prior to Admission medications   Medication Sig Start Date End Date Taking? Authorizing Provider  acyclovir (ZOVIRAX) 800 MG tablet Take 1 tablet (800 mg total) by mouth 5 (five) times daily. Patient not taking: Reported on 08/13/2014 08/02/14   Ozella Rocks, MD  clonazePAM (KLONOPIN) 1 MG tablet Take 1 tablet (1 mg total) by mouth 2 (two) times daily. Patient not taking: Reported on 08/13/2014 03/13/14   Linna Hoff, MD  diazepam (VALIUM) 2 MG tablet Take 1 tablet (2 mg total) by mouth every 6 (six) hours as needed for muscle spasms. Patient not taking: Reported on 08/13/2014 08/04/14   Yolande Jolly, MD  diclofenac (CATAFLAM) 50 MG tablet Take 1 tablet (50 mg total) by mouth 3 (three) times daily. For knee pain Patient not taking: Reported on 08/13/2014 07/27/14   Linna Hoff, MD  ibuprofen (ADVIL,MOTRIN) 600 MG tablet Take 1 tablet (600 mg total) by mouth every 6 (six) hours as needed. 08/04/14   Yolande Jolly, MD  traZODone (DESYREL) 50 MG tablet Take 2 tablets (100 mg total) by mouth at bedtime. Patient not taking: Reported on 08/13/2014 03/15/14   Ambrose Finland, NP   BP 112/60 mmHg  Pulse 79  Temp(Src) 98.3 F (36.8 C) (Oral)  Resp 22  SpO2 99% Physical Exam  Constitutional: He is oriented to person, place,  and time. He appears well-developed and well-nourished.  HENT:  Head: Normocephalic and atraumatic.  Eyes: Conjunctivae and EOM are normal. Pupils are equal, round, and reactive to light.  Neck: Normal range of motion. Neck supple.  Cardiovascular: Normal rate, regular rhythm and normal heart sounds.   Pulmonary/Chest: Effort normal and breath sounds normal. No respiratory distress.  Lungs CTA bilaterally.   Abdominal: Soft. Bowel sounds are normal. He exhibits no distension. There is no tenderness.  Musculoskeletal: Normal range of motion.  Neurological: He is alert and oriented to person, place, and time.  Skin: Skin is warm and  dry.  Cracked skin between the toes. Appears to be tinea pedis.   Psychiatric: He has a normal mood and affect. His behavior is normal.  Nursing note and vitals reviewed.  ED Course  Procedures (including critical care time) DIAGNOSTIC STUDIES: Oxygen Saturation is 99% on RA, normal by my interpretation.    COORDINATION OF CARE: 7:00 PM Discussed treatment plan with pt at bedside and pt agreed to plan.   Labs Review Labs Reviewed - No data to display  Imaging Review No results found. I have personally reviewed and evaluated these images and lab results as part of my medical decision-making.   EKG Interpretation None     Tinea Pedis. RX for lamisil. Follow-up with PCP. MDM   Final diagnoses:  Tinea pedis of both feet     I personally performed the services described in this documentation, which was scribed in my presence. The recorded information has been reviewed and is accurate.   Gabriel Morn, NP 10/18/14 0024  Courteney Randall An, MD 10/18/14 1610

## 2014-10-17 NOTE — Discharge Instructions (Signed)
Pie de atleta (Athlete's Foot) El pie de atleta (tinea pedis) es una infeccin por hongos en la piel de los pies. Generalmente aparece en la piel que se encuentra entre los dedos o debajo de los mismos. Tambin puede aparecer en la planta de los pies. El pie de atleta se produce con ms frecuencia cuando el clima es clido y hmedo. La falta de higiene en los pies o no cambiarse los calcetines con frecuencia puede contribuir a la aparicin del pie de atleta. La infeccin puede transmitirse de [place] persona a otra (escontagiosa).  CAUSAS La causa del pie de atleta es un hongo. Este hongo se desarrolla en lugares clidos y hmedos. La [employer] de las personas se contagian al compartir duchas, toallas y pisos mojados con una persona infectada. Las personas con el sistema inmunolgico dbil, incluidas la personas con diabetes, son ms propensas al pie de atleta. SNTOMAS  Picazn entre los dedos o en las plantas de los pies.  Aparecen zonas blanquecinas o escamosas entre los dedos o las plantas de los pies.  Pueden aparecer tambin pequeas ampollas que producen una picazn intensa.  Pequeos cortes en la piel. En estos cortes puede desarrollarse una infeccin bacteriana.  Las uas de los pies pueden volverse ms finas o descoloridas. DIAGNSTICO El mdico puede diagnosticar el problema haciendo un examen fsico. Tambin podr tomar Lauris Poag de piel de la zona de la erupcin. La muestra de piel es examinada en el microscopio o puede realizarse una prueba para ver si se desarrollan hongos. Tambin podr tomarse una muestra de la ua para Chiropractor.  TRATAMIENTO Para eliminar los hongos se utilizan medicamentos de venta libre o recetados. Estos medicamentos estn disponibles en forma de polvos o cremas. El mdico podr sugerirle medicamentos. Las infecciones por hongos responden lentamente al [employer]. Puede ser que necesite usar el medicamento durante varias semanas.  PREVENCIN   No comparta  toallas.  Use sandalias cuando tenga que pisar zonas hmedas. como duchas y vestuarios compartidos.  Mantenga sus pies secos. Use zapatos que permitan la circulacin de aire. Use calcetines de algodn o lana. INSTRUCCIONES PARA EL CUIDADO DOMICILIARIO  Tome todos los medicamentos segn le indic su mdico. No se aplique cremas con corticoides en el pie de atleta.  Mantenga los pies limpios y frescos. Lave sus pies todos los [address]  [address] y squelos cuidadosamente, [employer] dedos.  Cmbiese los calcetines [employer]. Use calcetines de algodn o lana. En climas clidos puede ser necesario cambiarse los calcetines 2  3 veces por da.  Use sandalias o zapatillas de lona que tengan buena ventilacin.  Si tiene ampollas, remoje los pies en solucin de [place] o sales de [place] durante 20 a 30 minutos 2 veces por da para [employer]. Luego asegrese de [employer]. SOLICITE ATENCIN MDICA SI:  Lance Muss.  Aumenta la hinchazn, el dolor, el calor o el enrojecimiento en el pie.  No mejora luego de 7 [address]  No se cura completamente luego de 30 das.  Tiene problemas relacionados con los medicamentos. EST SEGURO QUE:   Comprende las instrucciones para el alta mdica.  Controlar su enfermedad.  Solicitar atencin mdica de inmediato segn las indicaciones. Document Released: 01/04/2005 Document Revised: 03/29/2011 [facility] Patient Information 2015 [place], [place]. This information is not intended to replace advice given to you by your health care provider. Make sure you discuss any questions you have with your health care provider. Athlete's Foot Athlete's foot (tinea pedis) is a  fungal infection of the skin on the feet. It often occurs on the skin between the toes or underneath the toes. It can also occur on the soles of the feet. Athlete's foot is more likely to occur in hot, humid weather. Not washing your feet or changing your  socks often enough can contribute to athlete's foot. The infection can spread from person to person (contagious). CAUSES Athlete's foot is caused by a fungus. This fungus thrives in warm, moist places. Most people get athlete's foot by sharing shower stalls, towels, and wet floors with an infected person. People with weakened immune systems, including those with diabetes, may be more likely to get athlete's foot. SYMPTOMS   Itchy areas between the toes or on the soles of the feet.  White, flaky, or scaly areas between the toes or on the soles of the feet.  Tiny, intensely itchy blisters between the toes or on the soles of the feet.  Tiny cuts on the skin. These cuts can develop a bacterial infection.  Thick or discolored toenails. DIAGNOSIS  Your caregiver can usually tell what the problem is by doing a physical exam. Your caregiver may also take a skin sample from the rash area. The skin sample may be examined under a microscope, or it may be tested to see if fungus will grow in the sample. A sample may also be taken from your toenail for testing. TREATMENT  Over-the-counter and prescription medicines can be used to kill the fungus. These medicines are available as powders or creams. Your caregiver can suggest medicines for you. Fungal infections respond slowly to treatment. You may need to continue using your medicine for several weeks. PREVENTION   Do not share towels.  Wear sandals in wet areas, such as shared locker rooms and shared showers.  Keep your feet dry. Wear shoes that allow air to circulate. Wear cotton or wool socks. HOME CARE INSTRUCTIONS   Take medicines as directed by your caregiver. Do not use steroid creams on athlete's foot.  Keep your feet clean and cool. Wash your feet daily and dry them thoroughly, especially between your toes.  Change your socks every day. Wear cotton or wool socks. In hot climates, you may need to change your socks 2 to 3 times per  day.  Wear sandals or canvas tennis shoes with good air circulation.  If you have blisters, soak your feet in [place]'s solution or Epsom salts for 20 to 30 minutes, 2 times a day to dry out the blisters. Make sure you dry your feet thoroughly afterward. SEEK MEDICAL CARE IF:   You have a fever.  You have swelling, soreness, warmth, or redness in your foot.  You are not getting better after 7 days of treatment.  You are not completely cured after 30 days.  You have any problems caused by your medicines. MAKE SURE YOU:   Understand these instructions.  Will watch your condition.  Will get help right away if you are not doing well or get worse. Document Released: 01/02/2000 Document Revised: 03/29/2011 Document Reviewed: 10/23/2010 [facility] Patient Information 2015 [place], [place]. This information is not intended to replace advice given to you by your health care provider. Make sure you discuss any questions you have with your health care provider.

## 2014-10-31 ENCOUNTER — Encounter: Payer: Self-pay | Admitting: Internal Medicine

## 2014-10-31 ENCOUNTER — Ambulatory Visit: Payer: Self-pay | Attending: Internal Medicine | Admitting: Internal Medicine

## 2014-10-31 VITALS — BP 117/71 | HR 81 | Temp 98.0°F | Resp 16 | Ht 65.0 in | Wt 190.0 lb

## 2014-10-31 DIAGNOSIS — Z0000 Encounter for general adult medical examination without abnormal findings: Secondary | ICD-10-CM

## 2014-10-31 DIAGNOSIS — L74513 Primary focal hyperhidrosis, soles: Secondary | ICD-10-CM

## 2014-10-31 LAB — POCT GLYCOSYLATED HEMOGLOBIN (HGB A1C): HEMOGLOBIN A1C: 5.1

## 2014-10-31 NOTE — Progress Notes (Signed)
Patient complains of having sweaty feet for about  Two weeks Patient was seen in the ed and told to use epsom salts and soak his feet The epsom salts are not helping

## 2014-10-31 NOTE — Progress Notes (Signed)
Patient ID: Gabriel Ball, male   DOB: Jun 07, 1970, 44 y.o.   MRN: [id number]  CC: sweaty feet  HPI: Gabriel Ball is a 44 y.o. male here today for a follow up visit.  Patient has past medical history of Herpes. Patient reports that he was seen in the ER 2 weeks ago for symptoms of sweaty feet. He reports that he has been seen by a podiatrist in the past and was told to use gold bond powder and epsom salt soaks but he has not had any relief. He has also use lamisil cream in the past as well. He denies itching, burning, or peeling skin.   Patient has No headache, No chest pain, No abdominal pain - No Nausea, No new weakness tingling or numbness, No Cough - SOB.  Allergies  Allergen Reactions  . Penicillins   . Amoxicillin Itching    Pruritic rash   Past Medical History  Diagnosis Date  . Herpes   . Shingles    Current Outpatient Prescriptions on File Prior to Visit  Medication Sig Dispense Refill  . ibuprofen (ADVIL,MOTRIN) 600 MG tablet Take 1 tablet (600 mg total) by mouth every 6 (six) hours as needed. 30 tablet 0  . acyclovir (ZOVIRAX) 800 MG tablet Take 1 tablet (800 mg total) by mouth 5 (five) times daily. (Patient not taking: Reported on 08/13/2014) 35 tablet 0  . clonazePAM (KLONOPIN) 1 MG tablet Take 1 tablet (1 mg total) by mouth 2 (two) times daily. (Patient not taking: Reported on 08/13/2014) 30 tablet 0  . diazepam (VALIUM) 2 MG tablet Take 1 tablet (2 mg total) by mouth every 6 (six) hours as needed for muscle spasms. (Patient not taking: Reported on 08/13/2014) 20 tablet 0  . diclofenac (CATAFLAM) 50 MG tablet Take 1 tablet (50 mg total) by mouth 3 (three) times daily. For knee pain (Patient not taking: Reported on 08/13/2014) 30 tablet 0  . terbinafine (LAMISIL AT) 1 % cream Apply 1 application topically 2 (two) times daily. 30 g 0  . traZODone (DESYREL) 50 MG tablet Take 2 tablets (100 mg total) by mouth at bedtime. (Patient not taking: Reported on 08/13/2014) 60 tablet 1   No  current facility-administered medications on file prior to visit.   Family History  Problem Relation Age of Onset  . Cancer Father    Social History   Social History  . Marital Status: Single    Spouse Name: N/A  . Number of Children: N/A  . Years of Education: N/A   Occupational History  . Not on file.   Social History Main Topics  . Smoking status: Former Games developermoker  . Smokeless tobacco: Not on file  . Alcohol Use: No  . Drug Use: No  . Sexual Activity: Not on file   Other Topics Concern  . Not on file   Social History Narrative    Review of Systems: Other than what is stated in HPI, all other systems are negative.   Objective:   Filed Vitals:   10/31/14 1702  BP: 117/71  Pulse: 81  Temp: 98 F (36.7 C)  Resp: 16    Physical Exam  Constitutional: He is oriented to person, place, and time.  Cardiovascular: Normal rate, regular rhythm and normal heart sounds.   Pulses:      Dorsalis pedis pulses are 2+ on the right side, and 2+ on the left side.  Pulmonary/Chest: Effort normal and breath sounds normal.  Feet:  Right Foot:  Skin Integrity: Negative  for skin breakdown, erythema or dry skin.  Left Foot:  Skin Integrity: Negative for skin breakdown, erythema or dry skin.  Neurological: He is alert and oriented to person, place, and time.  Skin: Skin is warm and dry. No erythema.     Lab Results  Component Value Date   WBC 5.8 02/23/2014   HGB 14.3 02/23/2014   HCT 40.3 02/23/2014   MCV 80.8 02/23/2014   PLT 198 02/23/2014   Lab Results  Component Value Date   CREATININE 0.60 02/23/2014   BUN 10 02/23/2014   NA 140 02/23/2014   K 3.6 02/23/2014   CL 109 02/23/2014   CO2 28 02/23/2014    Lab Results  Component Value Date   HGBA1C 5.10 10/31/2014   Lipid Panel  No results found for: CHOL, TRIG, HDL, CHOLHDL, VLDL, LDLCALC     Assessment and plan:   Kairon was seen today for sweaty feet.  Diagnoses and all orders for this visit:  Sweaty  feet I do not see any evidence of tinea pedis or anything for the patient to be concerned about. I have advised patient to change socks often and continue gold bond powder as needed. I have went over signs of tinea pedis and advised him to return if he notes any of these symptoms.   Preventative health care -     HgB A1c   Due to language barrier, an interpreter was present during the history-taking and subsequent discussion (and for part of the physical exam) with this patient.  Return if symptoms worsen or fail to improve.        Ambrose Finland, NP-C [facility] [phone] 10/31/2014, 5:16 PM

## 2014-11-23 ENCOUNTER — Emergency Department (HOSPITAL_COMMUNITY): Payer: Self-pay

## 2014-11-23 ENCOUNTER — Encounter (HOSPITAL_COMMUNITY): Payer: Self-pay | Admitting: *Deleted

## 2014-11-23 ENCOUNTER — Emergency Department (HOSPITAL_COMMUNITY)
Admission: EM | Admit: 2014-11-23 | Discharge: 2014-11-23 | Disposition: A | Discharge status: Home / Self Care | Payer: Self-pay | Attending: Emergency Medicine | Admitting: Emergency Medicine

## 2014-11-23 DIAGNOSIS — R1031 Right lower quadrant pain: Secondary | ICD-10-CM

## 2014-11-23 DIAGNOSIS — Z880 Allergy status to penicillin: Secondary | ICD-10-CM | POA: Insufficient documentation

## 2014-11-23 DIAGNOSIS — Z87891 Personal history of nicotine dependence: Secondary | ICD-10-CM | POA: Insufficient documentation

## 2014-11-23 DIAGNOSIS — Z8619 Personal history of other infectious and parasitic diseases: Secondary | ICD-10-CM | POA: Insufficient documentation

## 2014-11-23 DIAGNOSIS — R52 Pain, unspecified: Secondary | ICD-10-CM

## 2014-11-23 DIAGNOSIS — M549 Dorsalgia, unspecified: Secondary | ICD-10-CM | POA: Insufficient documentation

## 2014-11-23 LAB — URINALYSIS, ROUTINE W REFLEX MICROSCOPIC
Bilirubin Urine: NEGATIVE
Glucose, UA: NEGATIVE mg/dL
Hgb urine dipstick: NEGATIVE
KETONES UR: 15 mg/dL — AB
LEUKOCYTES UA: NEGATIVE
NITRITE: NEGATIVE
Protein, ur: NEGATIVE mg/dL
Specific Gravity, Urine: 1.03 (ref 1.005–1.030)
Urobilinogen, UA: 1 mg/dL (ref 0.0–1.0)
pH: 6.5 (ref 5.0–8.0)

## 2014-11-23 MED ORDER — OXYCODONE-ACETAMINOPHEN 5-325 MG PO TABS
1.0000 | ORAL_TABLET | Freq: Once | ORAL | Status: AC
  Administered 2014-11-23: 1 via ORAL
  Filled 2014-11-23: qty 1

## 2014-11-23 MED ORDER — IBUPROFEN 600 MG PO TABS: 600.0000 mg | ORAL_TABLET | Freq: Four times a day (QID) | ORAL | Status: DC | PRN

## 2014-11-23 NOTE — ED Notes (Signed)
Patient returned from US department.  

## 2014-11-23 NOTE — ED Notes (Signed)
Pt reports right testicle pain and swelling x 15 days, pain radiates into right groin and into back. Denies urinary symptoms.

## 2014-11-23 NOTE — ED Notes (Signed)
EDP explained tests results and discharge plan to pt.  

## 2014-11-23 NOTE — ED Provider Notes (Signed)
CSN: [id number]     Arrival date & time 11/23/14  1517 History   First MD Initiated Contact with Patient 11/23/14 1734     Chief Complaint  Patient presents with  . Groin Pain     Patient is a 44 y.o. male presenting with groin pain. The history is provided by the patient. A language interpreter was used ([id number]).  Groin Pain This is a new problem. The current episode started more than 1 week ago. The problem occurs daily. The problem has been gradually worsening. Pertinent negatives include no chest pain, no abdominal pain and no shortness of breath. Exacerbated by: movement. The symptoms are relieved by rest.  pt reports after heavy lifting 15 days ago he had onset of low back pain Soon after he was masturbating and he had onset of right testicle pain No fever/vomiting No dysuria No penile discharge He reports he is not sexually active No h/o GC/chlamydia No trauma or falls No nocturia reported    Past Medical History  Diagnosis Date  . Herpes   . Shingles    History reviewed. No pertinent past surgical history. Family History  Problem Relation Age of Onset  . Cancer Father    Social History  Substance Use Topics  . Smoking status: Former Games developer  . Smokeless tobacco: None  . Alcohol Use: No    Review of Systems  Constitutional: Negative for fever.  Respiratory: Negative for shortness of breath.   Cardiovascular: Negative for chest pain.  Gastrointestinal: Negative for abdominal pain.  Genitourinary: Positive for testicular pain. Negative for dysuria, discharge and difficulty urinating.      Allergies  Penicillins and Amoxicillin  Home Medications   Prior to Admission medications   Medication Sig Start Date End Date Taking? Authorizing Provider  acyclovir (ZOVIRAX) 800 MG tablet Take 1 tablet (800 mg total) by mouth 5 (five) times daily. Patient not taking: Reported on 08/13/2014 08/02/14   Ozella Rocks, MD  clonazePAM (KLONOPIN) 1 MG tablet Take 1 tablet  (1 mg total) by mouth 2 (two) times daily. Patient not taking: Reported on 08/13/2014 03/13/14   Linna Hoff, MD  diazepam (VALIUM) 2 MG tablet Take 1 tablet (2 mg total) by mouth every 6 (six) hours as needed for muscle spasms. Patient not taking: Reported on 08/13/2014 08/04/14   Yolande Jolly, MD  diclofenac (CATAFLAM) 50 MG tablet Take 1 tablet (50 mg total) by mouth 3 (three) times daily. For knee pain Patient not taking: Reported on 08/13/2014 07/27/14   Linna Hoff, MD  ibuprofen (ADVIL,MOTRIN) 600 MG tablet Take 1 tablet (600 mg total) by mouth every 6 (six) hours as needed. 08/04/14   Hillery Hunter Melancon, MD  terbinafine (LAMISIL AT) 1 % cream Apply 1 application topically 2 (two) times daily. 10/17/14   [name], NP  traZODone (DESYREL) 50 MG tablet Take 2 tablets (100 mg total) by mouth at bedtime. Patient not taking: Reported on 08/13/2014 03/15/14   Ambrose Finland, NP   BP 103/45 mmHg  Pulse 72  Temp(Src) 98.7 F (37.1 C) (Oral)  Resp 18  SpO2 98% Physical Exam CONSTITUTIONAL: Well developed/well nourished HEAD: Normocephalic/atraumatic EYES: EOMI/PERRL ENMT: Mucous membranes moist NECK: supple no meningeal signs SPINE/BACK:entire spine nontender, lumbar paraspinal tenderness noted CV: S1/S2 noted, no murmurs/rubs/gallops noted LUNGS: Lungs are clear to auscultation bilaterally, no apparent distress ABDOMEN: soft, nontender, no rebound or guarding, bowel sounds noted throughout abdomen GU:no cva tenderness. Tenderness to right testicle.  No overlying  erythema/edema.  No inguinal hernia.  No lymphadenopathy.  No penile discharge.  No penile lesions.   NEURO: Pt is awake/alert/appropriate, moves all extremitiesx4.  No facial droop.  No focal motor deficits in the lower extremities EXTREMITIES: pulses normal/equal, full ROM SKIN: warm, color normal PSYCH: no abnormalities of mood noted, alert and oriented to situation  ED Course  Procedures  6:35 PM Will get [place] and  urinalysis Pt agreeable 8:25 PM Imaging/labs negative Pt well appearing No signs of hernia on exam No abd tenderness He also reports back pain but no focal leg weakness I feel he is safe for d/c home We discussed strict return precautions via spanish interpreter, including worsening back pain, leg weakness or worsened scrotal pain/swelling  Labs Review Labs Reviewed  URINALYSIS, ROUTINE W REFLEX MICROSCOPIC (NOT AT [facility]) - Abnormal; Notable for the following:    Ketones, ur 15 (*)    All other components within normal limits    Imaging Review US Scrotum  11/23/2014  CLINICAL DATA:  Right scrotal region pain for 15 days.  No injury. EXAM: SCROTAL ULTRASOUND DOPPLER ULTRASOUND OF THE TESTICLES TECHNIQUE: Complete ultrasound examination of the testicles, epididymis, and other scrotal structures was performed. Color and spectral Doppler ultrasound were also utilized to evaluate blood flow to the testicles. COMPARISON:  None. FINDINGS: Right testicle Measurements: 4.2 x 2.5 x 3.6 cm. No mass or microlithiasis visualized. Left testicle Measurements: 4.7 x 2.2 x 3.2 cm. No testicular masses. Two small areas of increased echogenicity are noted, 1 measuring 4 mm and the other 2.2 mm in greatest dimension. These are nonspecific. They may reflect deposits of calcium but all are not suspicious for microlithiasis. Right epididymis:  Normal in size and appearance. Left epididymis: Tiny epididymal head cyst measuring 2.7 mm. No other abnormality. Hydrocele:  None visualized. Varicocele:  There are bilateral prominent varicoceles Pulsed Doppler interrogation of both testes demonstrates normal low resistance arterial and venous waveforms bilaterally. IMPRESSION: 1. No evidence of a testicular mass or of portion. No evidence of epididymitis/orchitis. 2. 2 small echogenic foci in the left testicle. These are nonspecific and not felt to be clinically significant. 3. Prominent bilateral varicoceles which could be the  source of scrotal pain. Electronically Signed   By: Amie Portland M.D.   On: 11/23/2014 19:45   [place] Art/ven Flow Abd Pelv Doppler  11/23/2014  CLINICAL DATA:  Right scrotal region pain for 15 days.  No injury. EXAM: SCROTAL ULTRASOUND DOPPLER ULTRASOUND OF THE TESTICLES TECHNIQUE: Complete ultrasound examination of the testicles, epididymis, and other scrotal structures was performed. Color and spectral Doppler ultrasound were also utilized to evaluate blood flow to the testicles. COMPARISON:  None. FINDINGS: Right testicle Measurements: 4.2 x 2.5 x 3.6 cm. No mass or microlithiasis visualized. Left testicle Measurements: 4.7 x 2.2 x 3.2 cm. No testicular masses. Two small areas of increased echogenicity are noted, 1 measuring 4 mm and the other 2.2 mm in greatest dimension. These are nonspecific. They may reflect deposits of calcium but all are not suspicious for microlithiasis. Right epididymis:  Normal in size and appearance. Left epididymis: Tiny epididymal head cyst measuring 2.7 mm. No other abnormality. Hydrocele:  None visualized. Varicocele:  There are bilateral prominent varicoceles Pulsed Doppler interrogation of both testes demonstrates normal low resistance arterial and venous waveforms bilaterally. IMPRESSION: 1. No evidence of a testicular mass or of portion. No evidence of epididymitis/orchitis. 2. 2 small echogenic foci in the left testicle. These are nonspecific and not felt to be  clinically significant. 3. Prominent bilateral varicoceles which could be the source of scrotal pain. Electronically Signed   By: Amie Portlandavid  Ormond M.D.   On: 11/23/2014 19:45   I have personally reviewed and evaluated these images and lab results as part of my medical decision-making.   Medications  oxyCODONE-acetaminophen (PERCOCET/ROXICET) 5-325 MG per tablet 1 tablet (1 tablet Oral Given 11/23/14 1820)    MDM   Final diagnoses:  Pain  Right groin pain  Back pain, unspecified location    Nursing notes  including past medical history and social history reviewed and considered in documentation Labs/vital reviewed myself and considered during evaluation     Zadie Rhineonald Rome Echavarria, MD 11/23/14 2028

## 2015-01-17 ENCOUNTER — Telehealth: Payer: Self-pay | Admitting: Internal Medicine

## 2015-01-17 NOTE — Telephone Encounter (Signed)
Pt. Called requesting to speak to nurse regarding a medication that was prescribed to his for Herpes. Please f/u with pt.

## 2015-01-21 ENCOUNTER — Telehealth: Payer: Self-pay

## 2015-01-21 NOTE — Telephone Encounter (Signed)
Interpreter line used [place] ID# [id number] Returned call to patient  Patient not available  Message left on voice mail to return our call

## 2015-02-10 ENCOUNTER — Other Ambulatory Visit: Payer: Self-pay | Admitting: Internal Medicine

## 2015-02-10 NOTE — Telephone Encounter (Signed)
Pt. Is requesting a prescription refill for acyclovir (ZOVIRAX) 400 MG tablet [[id number]]......pt. Has sore on tongue....please follow up with patient .

## 2015-02-10 NOTE — Telephone Encounter (Signed)
Pt. Came into facility requesting a med refill on acyclovir (ZOVIRAX) 400 MG tablet. Please f/u with pt.

## 2015-02-24 ENCOUNTER — Ambulatory Visit: Payer: Self-pay | Attending: Internal Medicine | Admitting: Internal Medicine

## 2015-02-24 ENCOUNTER — Encounter: Payer: Self-pay | Admitting: Internal Medicine

## 2015-02-24 VITALS — BP 108/64 | HR 79 | Temp 98.0°F | Resp 16 | Ht 65.0 in | Wt 191.6 lb

## 2015-02-24 DIAGNOSIS — Z87891 Personal history of nicotine dependence: Secondary | ICD-10-CM | POA: Insufficient documentation

## 2015-02-24 DIAGNOSIS — M79671 Pain in right foot: Secondary | ICD-10-CM | POA: Insufficient documentation

## 2015-02-24 DIAGNOSIS — M779 Enthesopathy, unspecified: Secondary | ICD-10-CM | POA: Insufficient documentation

## 2015-02-24 DIAGNOSIS — Z880 Allergy status to penicillin: Secondary | ICD-10-CM | POA: Insufficient documentation

## 2015-02-24 DIAGNOSIS — R238 Other skin changes: Secondary | ICD-10-CM

## 2015-02-24 DIAGNOSIS — M7671 Peroneal tendinitis, right leg: Secondary | ICD-10-CM

## 2015-02-24 DIAGNOSIS — L989 Disorder of the skin and subcutaneous tissue, unspecified: Secondary | ICD-10-CM

## 2015-02-24 DIAGNOSIS — Z79899 Other long term (current) drug therapy: Secondary | ICD-10-CM | POA: Insufficient documentation

## 2015-02-24 DIAGNOSIS — B009 Herpesviral infection, unspecified: Secondary | ICD-10-CM | POA: Insufficient documentation

## 2015-02-24 MED ORDER — MELOXICAM 15 MG PO TABS: 15.0000 mg | ORAL_TABLET | Freq: Every day | ORAL | Status: DC

## 2015-02-24 MED ORDER — ACYCLOVIR 400 MG PO TABS: 400.0000 mg | ORAL_TABLET | Freq: Two times a day (BID) | ORAL | Status: DC

## 2015-02-24 NOTE — Patient Instructions (Signed)
Peroneal Tendinitis With Rehab Tendonitis is inflammation of a tendon. Inflammation of the tendons on the back of the outer ankle (peroneal tendons) is known as peroneal tendonitis. The peroneal tendons are responsible for connecting the muscles that allow you to stand on your tiptoes to the bones of the ankle. For this reason, peroneal tendonitis often causes pain when trying to complete such motions. Peroneal tendonitis often involves a tear (strain) of the peroneal tendons. Strains are classified into three categories. Grade 1 strains cause pain, but the tendon is not lengthened. Grade 2 strains include a lengthened ligament, due to the ligament being stretched or partially ruptured. With grade 2 strains there is still function, although function may be decreased. Grade 3 strains involve a complete tear of the tendon or muscle, and function is usually impaired. SYMPTOMS   Pain, tenderness, swelling, warmth, or redness over the back of the outer side of the ankle, the outer part of the mid-foot, or the bottom of the arch.  Pain that gets worse with ankle motion (especially when pushing off or pushing down with the front of the foot), or when standing on the ball of the foot or pushing the foot outward.  Crackling sound (crepitation) when the tendon is moved or touched. CAUSES  Peroneal tendinitis occurs when injury to the peroneal tendons causes the body to respond with inflammation. Common causes of injury include:  An overuse injury, in which the groove behind the outer ankle (where the tendon is located) causes wear on the tendon.  A sudden stress placed on the tendon, such as from an increase in the intensity, frequency, or duration of training.  Direct hit (trauma) to the tendon.  Return to activity too soon after a previous ankle injury. RISK INCREASES WITH:  Sports that require sudden, repetitive pushing off of the foot, such as jumping or quick starts.  Kicking and running sports,  especially running down hills or long distances.  Poor strength and flexibility.  Previous injury to the foot, ankle, or leg. PREVENTION  Warm up and stretch properly before activity.  Allow for adequate recovery between workouts.  Maintain physical fitness:  Strength, flexibility, and endurance.  Cardiovascular fitness.  Complete rehabilitation after previous injury. PROGNOSIS  If treated properly, peroneal tendonitis usually heals within 6 weeks.  RELATED COMPLICATIONS  Longer healing time, if not properly treated or if not given enough time to heal.  Recurring symptoms if activity is resumed too soon, with overuse, or when using poor technique.  If untreated, tendinitis may result in tendon rupture, requiring surgery. TREATMENT  Treatment first involves the use of ice and medicine to reduce pain and inflammation. The use of strengthening and stretching exercises may help reduce pain with activity. These exercises may be performed at unsuccessful, surgery to remove the inflamed tendon lining (sheath) may be advised.  MEDICATION   If pain medicine is needed, nonsteroidal anti-inflammatory medicines (aspirin and ibuprofen), or other minor pain relievers (acetaminophen), are often advised.  Do not take pain medicine for 7 days before surgery.  Prescription pain relievers may be given, if your caregiver thinks they are needed. Use only as directed and only as much as you need. HEAT AND COLD  Cold treatment (icing) should be applied for 10 to 15 minutes every 2 to 3 hours for inflammation and pain, and immediately after activity that aggravates your symptoms. Use ice packs or an ice massage.  Heat treatment may be used before performing stretching and strengthening activities prescribed by your   caregiver, physical therapist, or athletic trainer. Use a heat pack or a warm water soak. SEEK MEDICAL CARE IF:  Symptoms get worse or do not improve in 2 to 4 weeks, despite  treatment.  New, unexplained symptoms develop. (Drugs used in treatment may produce side effects.) EXERCISES RANGE OF MOTION (ROM) AND STRETCHING EXERCISES - Peroneal Tendinitis These exercises may help you when beginning to rehabilitate your injury. Your symptoms may resolve with or without further involvement from your physician, physical therapist or athletic trainer. While completing these exercises, remember:   Restoring tissue flexibility helps normal motion to return to the joints. This allows healthier, less painful movement and activity.  An effective stretch should be held for at least 30 seconds.  A stretch should never be painful. You should only feel a gentle lengthening or release in the stretched tissue. RANGE OF MOTION - Ankle Eversion  Sit with your right / left ankle crossed over your opposite knee.  Grip your foot with your opposite hand, placing your thumb on the top of your foot and your fingers across the bottom of your foot.  Gently push your foot downward with a slight rotation, so your littlest toes rise slightly toward the ceiling.  You should feel a gentle stretch on the inside of your ankle. Hold the stretch for __________ seconds. Repeat __________ times. Complete this exercise __________ times per day.  RANGE OF MOTION - Ankle Inversion  Sit with your right / left ankle crossed over your opposite knee.  Grip your foot with your opposite hand, placing your thumb on the bottom of your foot and your fingers across the top of your foot.  Gently pull your foot so the smallest toe comes toward you and your thumb pushes the inside of the ball of your foot away from you.  You should feel a gentle stretch on the outside of your ankle. Hold the stretch for __________ seconds. Repeat __________ times. Complete this exercise __________ times per day.  RANGE OF MOTION - Ankle Plantar Flexion  Sit with your right / left leg crossed over your opposite knee.  Use  your opposite hand to pull the top of your foot and toes toward you.  You should feel a gentle stretch on the top of your foot and ankle. Hold this position for __________ seconds. Repeat __________ times. Complete __________ times per day.  STRETCH - Gastroc, Standing  Place your hands on a wall.  Extend your right / left leg behind you, keeping the front knee somewhat bent.  Slightly point your toes inward on your back foot.  Keeping your right / left heel on the floor and your knee straight, shift your weight toward the wall, not allowing your back to arch.  You should feel a gentle stretch in the calf. Hold this position for __________ seconds. Repeat __________ times. Complete this stretch __________ times per day. STRETCH - Soleus, Standing  Place your hands on a wall.  Extend your right / left leg behind you, keeping the other knee somewhat bent.  Slightly point your toes inward on your back foot.  Keep your heel on the floor, bend your back knee, and slightly shift your weight over the back leg so that you feel a gentle stretch deep in your back calf.  Hold this position for __________ seconds. Repeat __________ times. Complete this stretch __________ times per day. STRETCH - Gastrocsoleus, Standing Note: This exercise can place a lot of stress on your foot and ankle. Please   complete this exercise only if specifically instructed by your caregiver.   Place the ball of your right / left foot on a step, keeping your other foot firmly on the same step.  Hold on to the wall or a rail for balance.  Slowly lift your other foot, allowing your body weight to press your heel down over the edge of the step.  You should feel a stretch in your right / left calf.  Hold this position for __________ seconds.  Repeat this exercise with a slight bend in your knee. Repeat __________ times. Complete this stretch __________ times per day.  STRENGTHENING EXERCISES - Peroneal Tendinitis   These exercises may help you when beginning to rehabilitate your injury. They may resolve your symptoms with or without further involvement from your physician, physical therapist or athletic trainer. While completing these exercises, remember:   Muscles can gain both the endurance and the strength needed for everyday activities through controlled exercises.  Complete these exercises as instructed by your physician, physical therapist or athletic trainer. Increase the resistance and repetitions only as guided by your caregiver. STRENGTH - Dorsiflexors  Secure a rubber exercise band or tubing to a fixed object (table, pole) and loop the other end around your right / left foot.  Sit on the floor facing the fixed object. The band should be slightly tense when your foot is relaxed.  Slowly draw your foot back toward you, using your ankle and toes.  Hold this position for __________ seconds. Slowly release the tension in the band and return your foot to the starting position. Repeat __________ times. Complete this exercise __________ times per day.  STRENGTH - Towel Curls  Sit in a chair, on a non-carpeted surface.  Place your foot on a towel, keeping your heel on the floor.  Pull the towel toward your heel only by curling your toes. Keep your heel on the floor.  If instructed by your physician, physical therapist or athletic trainer, add weight to the end of the towel. Repeat __________ times. Complete this exercise __________ times per day. STRENGTH - Ankle Eversion   Secure one end of a rubber exercise band or tubing to a fixed object (table, pole). Loop the other end around your foot, just before your toes.  Place your fists between your knees. This will focus your strengthening at your ankle.  Drawing the band across your opposite foot, away from the pole, slowly, pull your little toe out and up. Make sure the band is positioned to resist the entire motion.  Hold this position for  __________ seconds.  Have your muscles resist the band, as it slowly pulls your foot back to the starting position. Repeat __________ times. Complete this exercise __________ times per day.    This information is not intended to replace advice given to you by your health care provider. Make sure you discuss any questions you have with your health care provider.   Document Released: 01/04/2005 Document Revised: 05/21/2014 Document Reviewed: 04/18/2008 Elsevier Interactive Patient Education 2016 [employer]  

## 2015-02-24 NOTE — Progress Notes (Signed)
Patient ID: Gabriel Ball, male   DOB: 08-28-1970, 45 y.o.   MRN: [id number]  CC: right heel pain, dry hands  HPI: Gabriel Ball is a 45 y.o. male here today for a follow up visit.  Patient has past medical history of herpes. Patient states that he has been having pain in his right heel below the malleolus and lateral foot. Patient denies any injury. He states that the pain began in his heels and it radiates to his ankle.  Patient states that he has dry hands and feet. He works outside in [employer] and he feels like his hands crack.  He is concerned about tender area on his anus. Patient denies any bumps or bleeding from anus.   Allergies  Allergen Reactions  . Penicillins   . Amoxicillin Itching    Pruritic rash   Past Medical History  Diagnosis Date  . Herpes   . Shingles    Current Outpatient Prescriptions on File Prior to Visit  Medication Sig Dispense Refill  . ibuprofen (ADVIL,MOTRIN) 600 MG tablet Take 1 tablet (600 mg total) by mouth every 6 (six) hours as needed. 30 tablet 0  . terbinafine (LAMISIL AT) 1 % cream Apply 1 application topically 2 (two) times daily. 30 g 0  . [DISCONTINUED] clonazePAM (KLONOPIN) 1 MG tablet Take 1 tablet (1 mg total) by mouth 2 (two) times daily. (Patient not taking: Reported on 08/13/2014) 30 tablet 0  . [DISCONTINUED] traZODone (DESYREL) 50 MG tablet Take 2 tablets (100 mg total) by mouth at bedtime. (Patient not taking: Reported on 08/13/2014) 60 tablet 1   No current facility-administered medications on file prior to visit.   Family History  Problem Relation Age of Onset  . Cancer Father    Social History   Social History  . Marital Status: Single    Spouse Name: N/A  . Number of Children: N/A  . Years of Education: N/A   Occupational History  . Not on file.   Social History Main Topics  . Smoking status: Former Games developer  . Smokeless tobacco: Not on file  . Alcohol Use: No  . Drug Use: No  . Sexual Activity: Not on file   Other  Topics Concern  . Not on file   Social History Narrative    Review of Systems: Other than what is stated in HPI, all other systems are negative.   Objective:   Filed Vitals:   02/24/15 1714  BP: 108/64  Pulse: 79  Temp: 98 F (36.7 C)  Resp: 16    Physical Exam  Constitutional: He is oriented to person, place, and time.  Genitourinary: Rectal exam shows tenderness.     Area of tenderness. Small skin tear  Musculoskeletal: He exhibits tenderness (malleolus and heel). He exhibits no edema.  Neurological: He is alert and oriented to person, place, and time.  Skin: No erythema.     Lab Results  Component Value Date   WBC 5.8 02/23/2014   HGB 14.3 02/23/2014   HCT 40.3 02/23/2014   MCV 80.8 02/23/2014   PLT 198 02/23/2014   Lab Results  Component Value Date   CREATININE 0.60 02/23/2014   BUN 10 02/23/2014   NA 140 02/23/2014   K 3.6 02/23/2014   CL 109 02/23/2014   CO2 28 02/23/2014    Lab Results  Component Value Date   HGBA1C 5.10 10/31/2014   Lipid Panel  No results found for: CHOL, TRIG, HDL, CHOLHDL, VLDL, LDLCALC     Assessment  and plan:   Gabriel Ball was seen today for foot pain.  Diagnoses and all orders for this visit:  Peroneal tendinitis, right -     meloxicam (MOBIC) 15 MG tablet; Take 1 tablet (15 mg total) by mouth daily. For tendonitis Advised patient to get ankle brace.  HSV (herpes simplex virus) infection -     acyclovir (ZOVIRAX) 400 MG tablet; Take 1 tablet (400 mg total) by mouth 2 (two) times daily.  Skin tear I am unsure if area is a resolving herpes lesion or skin irritation from wiping. He may use Aquaphor on area     Return if symptoms worsen or fail to improve.   Ambrose Finland, NP-C [facility] [phone] 02/24/2015, 6:11 PM

## 2015-02-24 NOTE — Progress Notes (Signed)
Patient complains of right heel pain that radiates down the outside of his Foot Also complains of hand and feet being dry on and off

## 2015-03-16 ENCOUNTER — Emergency Department (HOSPITAL_COMMUNITY)
Admission: EM | Admit: 2015-03-16 | Discharge: 2015-03-16 | Disposition: A | Discharge status: Home / Self Care | Payer: Self-pay | Attending: Emergency Medicine | Admitting: Emergency Medicine

## 2015-03-16 ENCOUNTER — Encounter (HOSPITAL_COMMUNITY): Payer: Self-pay | Admitting: Emergency Medicine

## 2015-03-16 DIAGNOSIS — Z87891 Personal history of nicotine dependence: Secondary | ICD-10-CM | POA: Insufficient documentation

## 2015-03-16 DIAGNOSIS — K029 Dental caries, unspecified: Secondary | ICD-10-CM | POA: Insufficient documentation

## 2015-03-16 DIAGNOSIS — Z880 Allergy status to penicillin: Secondary | ICD-10-CM | POA: Insufficient documentation

## 2015-03-16 DIAGNOSIS — Z79899 Other long term (current) drug therapy: Secondary | ICD-10-CM | POA: Insufficient documentation

## 2015-03-16 DIAGNOSIS — B009 Herpesviral infection, unspecified: Secondary | ICD-10-CM | POA: Insufficient documentation

## 2015-03-16 MED ORDER — ACYCLOVIR 400 MG PO TABS: 400.0000 mg | ORAL_TABLET | Freq: Every day | ORAL | Status: DC

## 2015-03-16 MED ORDER — MAGIC MOUTHWASH: 5.0000 mL | Freq: Three times a day (TID) | ORAL | Status: DC | PRN

## 2015-03-16 NOTE — ED Provider Notes (Signed)
CSN: [id number]     Arrival date & time 03/16/15  1142 History  By signing my name below, I, Gabriel Ball, attest that this documentation has been prepared under the direction and in the presence of Cheri Fowler, PA-C. Electronically Signed: Octavia Ball, ED Scribe. 03/16/2015. 12:37 PM.   Chief Complaint  Patient presents with  . Mouth Lesions     The history is provided by the patient. No language interpreter was used.   HPI Comments: Gabriel Ball is a 45 y.o. male who has a hx of herpes and shingles presents to the Emergency Department complaining of constant, gradual worsening, moderate, burning, white mouth lesions on the right side of his cheek with surrounding erythema onset 8 days ago. There is no drainage. He notes his gums started to hurt so he flossed and that made the pain worse. He has a hx of similar mouth lesions that he was evaluated for and treated with acyclovir. Pt has not taken any medication to alleviate his pain. Pt denies fever, recent changes to diet, and teeth pain.  He does not smoke.   Past Medical History  Diagnosis Date  . Herpes   . Shingles    History reviewed. No pertinent past surgical history. Family History  Problem Relation Age of Onset  . Cancer Father    Social History  Substance Use Topics  . Smoking status: Former Games developer  . Smokeless tobacco: None  . Alcohol Use: No    Review of Systems  Constitutional: Negative for fever.  HENT: Positive for mouth sores. Negative for drooling and facial swelling.   Neurological: Negative for headaches.  All other systems reviewed and are negative.     Allergies  Penicillins and Amoxicillin  Home Medications   Prior to Admission medications   Medication Sig Start Date End Date Taking? Authorizing Provider  acyclovir (ZOVIRAX) 400 MG tablet Take 1 tablet (400 mg total) by mouth 2 (two) times daily. 02/24/15   Ambrose Finland, NP  ibuprofen (ADVIL,MOTRIN) 600 MG tablet Take 1 tablet (600 mg total) by  mouth every 6 (six) hours as needed. 11/23/14   Zadie Rhine, MD  meloxicam (MOBIC) 15 MG tablet Take 1 tablet (15 mg total) by mouth daily. For tendonitis 02/24/15   Ambrose Finland, NP  terbinafine (LAMISIL AT) 1 % cream Apply 1 application topically 2 (two) times daily. 10/17/14   [name], NP   Triage vitals: BP 104/61 mmHg  Pulse 80  Temp(Src) 98.8 F (37.1 C) (Oral)  Resp 20  SpO2 98% Physical Exam  Constitutional: He is oriented to person, place, and time. He appears well-developed and well-nourished.  HENT:  Head: Normocephalic and atraumatic.  Right Ear: External ear normal.  Left Ear: External ear normal.  Mouth/Throat: Uvula is midline, oropharynx is clear and moist and mucous membranes are normal. Oral lesions (shallow grey ulcer on right buccal mucosa near molars) present. No trismus in the jaw. Dental caries present.  No tongue swelling or facial swelling.  Airway patent. Patient tolerating secretions without difficulty.   Eyes: Conjunctivae are normal. No scleral icterus.  Neck: Normal range of motion. Neck supple. No tracheal deviation present.  Cardiovascular: Normal rate, regular rhythm and normal heart sounds.   Pulmonary/Chest: Effort normal and breath sounds normal. No respiratory distress.  Abdominal: Bowel sounds are normal. He exhibits no distension.  Musculoskeletal: Normal range of motion.  Moves all extremities spontaneously.  Lymphadenopathy:    He has no cervical adenopathy.  Neurological: He is  alert and oriented to person, place, and time.  Speech clear without dysarthria.   Skin: Skin is warm and dry.  Psychiatric: He has a normal mood and affect. His behavior is normal.  Nursing note and vitals reviewed.   ED Course  Procedures  DIAGNOSTIC STUDIES: Oxygen Saturation is 98% on RA, normal by my interpretation.  COORDINATION OF CARE:  12:35 PM Discussed treatment plan which include magic mouth wash TID and acyclovir with pt at bedside and pt  agreed to plan.  Labs Review Labs Reviewed - No data to display  Imaging Review No results found. I have personally reviewed and evaluated these images and lab results as part of my medical decision-making.   EKG Interpretation None      MDM   Final diagnoses:  Recurrent HSV (herpes simplex virus)    Findings consistent with herpes flare.  VSS, NAD.  Doubt leukoplakia, candida, or infectious etiology.  Plan to discharge home with Acyclovir and magic mouthwash.  Discussed return precautions. Follow up PCP.  Patient agrees and acknowledges the above plan for discharge.  I personally performed the services described in this documentation, which was scribed in my presence. The recorded information has been reviewed and is accurate.    Cheri Fowler, PA-C 03/16/15 1252  Cheri Fowler, PA-C 03/16/15 1255  Blane Ohara, MD 03/16/15 1538

## 2015-03-16 NOTE — Discharge Instructions (Signed)
Herpes labial  (Cold Sore)  El herpes labial (herpes febril) es una infeccin cutnea causada por el virus del herpes simplex (HSV-1). El HSV-1 est muy relacionado con el virus que causa el herpes genital (HSV-2), pero no son iguales aunque ambos causen infecciones orales y Teaching laboratory technician. El herpes labial son pequeas llagas llenas de lquido que se forman dentro de la boca o en los labios, [place], [place], [place], mejillas o dedos.  El herpes simplex se transmite fcilmente (se [place]) a Producer, television/film/video a travs del Diplomatic Services operational officer personal, como los besos o el compartir utensilios personales. El tambin puede extenderse a otras partes del cuerpo, como a los ojos o a los genitales. El herpes labial se contagia hasta que las llagas se cicatrizan completamente. Generalmente se curan en 2 semanas.  Una vez que una persona se infecta, el herpes simplex permanece siempre en el organismo. Por lo tanto, no hay cura, y generalmente vuelve a aparecer cuando la persona est muy cansada, estresada, enferma o toma mucho sol. Los factores adicionales que pueden favorecer una recurrencia son los cambios hormonales de la menstruacin o el [place], ciertos frmacos y [place] fro.  CAUSAS  La causa del herpes labial es el virus del herpes simplex. El virus se contagia de Ardelia Mems persona a otra a travs del contacto directo, como al besarse, tocar la zona afectada o compartir elementos personales como protector labial, afeitadoras o cubiertos.  SNTOMAS  La primera infeccin puede no causar sntomas. Si aparecen sntomas, tendrn diferentes etapas. De este modo se desarrolla el herpes labial:   [employer] antes del brote, podr sentir pinchazos, picazn o sensacin ardiente.   Las [employer] de lquido aparecen [employer] Computer labios, dentro de la boca, la nariz o en las Hunter.   Las ampollas comienzan a supurar un lquido Engineer, structural.   Las ampollas se secan y aparece una Journalist, newspaper.   La costra  se cae.  Los sntomas dependen si es el brote inicial o Engineer, building services. Algunos otros sntomas del primer brote son:   Cristy Hilts.   Dolor de Investment banker, operational.   Dolor de [place].   Dolores musculares.   Ganglios hinchados en el cuello.  [place] se realiza segn los sntomas y la observacin de las llagas. En algunos casos, se pasa un hisopo en los labios y luego se examina en el laboratorio para Optometrist un diagnstico final. Si no hay llagas, los anlisis de sangre podrn hallar el virus del herpes simplex.  TRATAMIENTO  No hay cura para el herpes labial y no existe una vacuna para el virus del herpes simplex. Dentro de las 2 semanas, la mayor parte de las Doctor, general practice sin tratamiento. Los medicamentos no curan la infeccin, pero algunos frmacos pueden ayudar a Best boy asociado a las llagas, pueden impedir que el virus se multiplique y Training and development officer tiempo de curacin. Los medicamentos se presentan en forma de crema, gel, comprimidos o inyeccin.  INSTRUCCIONES PARA EL CUIDADO EN EL HOGAR   Solo tome medicamentos de venta libre o recetados para Conservation officer, historic buildings, Tree surgeon o fiebre, segn las indicaciones del mdico. No tome aspirina.   Use un hisopo de algodn para aplicar crema o gel en las llagas.   No se toque las ampollas ni retire las Surveyor, mining. Lave sus manos con frecuencia. No se toque los ojos sin antes lavarse las manos.   Evite los besos, el sexo oral y compartir artculos personales hasta que las llagas se  curen.   Dian Situ bolsa de hielo sobre las llagas durante 10 a 15 minutos para Acupuncturist.   Evite las comidas calientes, fras o saladas ya que pueden lastimar la boca. Consuma una dieta blanda para evitar la irritacin de las llagas. Use un sorbete si siente dolor al beber con un vaso.   Mantenga las llagas higienizadas y secas para evitar la infeccin en otros tejidos.   Evite el sol y evite las situaciones de estrs si es lo que  causa las llagas. Si el sol causa las llagas, aplique pantalla solar sobre los labios antes de salir al sol.  SOLICITE ATENCIN MDICA SI:   Tiene fiebre o sntomas persistentes durante ms de 2  3 das.   Tiene fiebre y los sntomas [address].   Observa pus, un lquido que no es claro, en las llagas.   El enrojecimiento se expande.   Siente dolor o irritacin en el ojo.   Tiene llagas en los genitales.   Las llagas no se curan en 2 semanas.   Su sistema inmunolgico est debilitado.   Tiene frecuentes recurrencias del herpes labial.  ASEGRESE DE QUE:   Comprende estas instrucciones.  Controlar su enfermedad.  Solicitar ayuda de inmediato si no mejora o si empeora.   Esta informacin no tiene Theme park manager el consejo del mdico. Asegrese de hacerle al mdico cualquier pregunta que tenga.   Document Released: 01/04/2005 Document Revised: 09/29/2011 Elsevier Interactive Patient Education 2016 [employer].  Gabriel Ball (Oral Ulcers) Las lceras orales son llagas profundas y dolorosas alrededor de la boca. Esto puede [employer], la parte interna de los labios y las mejillas (las llagas por fuera de los labios y en la cara son diferentes). Suelen ocurrir en nios y adolescentes de Estate agent. Las lceras orales tambin se llaman aftas. CAUSAS Las aftas pueden producirse por diferentes factores, entre los que se incluyen:  Infeccion  Lesiones  Exposicin al sol  Medicamentos.  Estrs emocional  Alergia alimentaria  Dficit de vitaminas.  Pastas de dientes que contienen sulfato de lauril sodio. El virus del herpes puede ser la causa de lceras orales. La primera infeccin puede ser grave y causar 10 o ms lceras de las encas, lengua y labios con fiebre y dificultad para tragar. Esta infeccin suele ocurrir [employer] 1 y los 3 aos de [place].  SNTOMAS La llaga tpica es de aproximadamente  de pulgada (6 mm) y tiene forma oval o  redondeada con bordes rojos. DIAGNSTICO El profesional que lo asiste puede habitualmente diagnosticar esta infeccin examinndolo. Generalmente no se solicitan otras pruebas.  TRATAMIENTO El tratamiento apunta a Engineer, materials. Por lo general, las lceras orales se curan solas dentro de 1  2 semanas sin medicacin y no son contagiosas a menos que estn causadas por Herpes (y otros virus). Los antibiticos no son efectivos con las Engineer, site. Evite el contacto directo con otras personas a menos que la lcera est curada por completo. Comunquese con los profesionales que lo asisten para Education officer, environmental un seguimiento segn las indicaciones. Adems:  Ofrezca una dieta blanda.  Ofrzcales lquidos en abundancia para evitar la deshidratacin. Helados y batidos podrn ser tiles.  Evite alimentos cidos y salados y bebidas como jugo de [place].  Los bebs y nios podrn no querer beber debido al [employer]. Utilice una cuchara o jeringa para darle pequeas cantidades de lquidos de manera frecuente y Agricultural engineer.  Las compresas fras en la cara pueden ayudar a  disminuir el dolor.  Los medicamentos para Chief Technology Officer pueden ser tiles.  Una solucin de difenhidramina mezclada con un lquido anticido puede ser til para Teacher, early years/pre de las lceras. Consulte con un mdico para saber cul es la dosis correcta.  Podrn ser tiles los lquidos o pomadas con un ingrediente adormecedor segn le recomiende el mdico.  Los nios [employer] y los adolescentes pueden hacerse enjuagues con una mezcla de agua con sal (1/2 cucharada [2,5 cc] de sal in 8 onzas de agua [236 ml]) cuatro veces al [employer]. Este tratamiento es incmodo pero puede reducir el tiempo de las lceras.  Hay muchas pastillas para la garganta y medicamentos de venta libre disponibles para tratar las lceras orales. No se ha estudiado su efectividad.  Consulte con el mdico antes de Education officer, environmental un tratamiento homeoptico para las lceras  orales. SOLICITE ATENCIN MDICA SI:  Cree que el nio necesita atencin mdica.  El dolor empeora y no puede controlarlo.  Existen 4 o ms lceras.  Los labios y encas sangran y se forma Deno Lunger.  Una sola lcera est cerca de un diente y Passenger transport manager.  El nio presenta fiebre y la cara o ganglios inflamados.  Las lceras aparecen luego de comenzar con un medicamento.  Las lceras bucales pueden ser recurrentes o durar ms de 2 semanas.  Cree que el nio no bebe suficientes lquidos. SOLICITE ATENCIN MDICA DE INMEDIATO SI:  El nio tiene fiebre alta.  El nio no puede tragar o est deshidratado.  El nio se ve y acta como si estuviera enfermo.  La lcera est causada por un qumico que el nio se llev a la boca de [place] accidental.   Esta informacin no tiene Theme park manager el consejo del mdico. Asegrese de hacerle al mdico cualquier pregunta que tenga.   Document Released: 01/04/2005 Document Revised: 03/29/2011 Elsevier Interactive Patient Education [employer]

## 2015-03-16 NOTE — ED Notes (Signed)
Pt from home with mouth lesions on his right cheek starting 8 days ago.  Denies hx of the same.  NAD, A&O.

## 2015-06-02 ENCOUNTER — Emergency Department (HOSPITAL_COMMUNITY)
Admission: EM | Admit: 2015-06-02 | Discharge: 2015-06-03 | Disposition: A | Discharge status: Home / Self Care | Payer: Self-pay | Attending: Emergency Medicine | Admitting: Emergency Medicine

## 2015-06-02 ENCOUNTER — Encounter (HOSPITAL_COMMUNITY): Payer: Self-pay | Admitting: *Deleted

## 2015-06-02 DIAGNOSIS — R0989 Other specified symptoms and signs involving the circulatory and respiratory systems: Secondary | ICD-10-CM

## 2015-06-02 DIAGNOSIS — J3489 Other specified disorders of nose and nasal sinuses: Secondary | ICD-10-CM | POA: Insufficient documentation

## 2015-06-02 DIAGNOSIS — Z791 Long term (current) use of non-steroidal anti-inflammatories (NSAID): Secondary | ICD-10-CM | POA: Insufficient documentation

## 2015-06-02 DIAGNOSIS — Z8619 Personal history of other infectious and parasitic diseases: Secondary | ICD-10-CM | POA: Insufficient documentation

## 2015-06-02 DIAGNOSIS — Z79899 Other long term (current) drug therapy: Secondary | ICD-10-CM | POA: Insufficient documentation

## 2015-06-02 DIAGNOSIS — Z880 Allergy status to penicillin: Secondary | ICD-10-CM | POA: Insufficient documentation

## 2015-06-02 DIAGNOSIS — Z87891 Personal history of nicotine dependence: Secondary | ICD-10-CM | POA: Insufficient documentation

## 2015-06-02 NOTE — ED Notes (Signed)
Right nasal burning since this morning, feels like something is in his nose

## 2015-06-02 NOTE — ED Provider Notes (Signed)
CSN: [id number]     Arrival date & time 06/02/15  2234 History  By signing my name below, I, Linna Darner, attest that this documentation has been prepared under the direction and in the presence of non-physician practitioner, [name], NP. Electronically Signed: Linna Darner, Scribe. 06/02/2015. 11:49 PM.   Chief Complaint  Patient presents with  . Nasal Burning     Patient is a 45 y.o. male presenting with foreign body in nose. The history is provided by the patient and a friend. No language interpreter was used.  Foreign Body in Nose This is a new problem. The current episode started 12 to 24 hours ago. Pertinent negatives include no shortness of breath. He has tried nothing for the symptoms.     HPI Comments: Gabriel Ball is a 45 y.o. male who presents to the Emergency Department complaining of sudden onset, constant, right nasal burning and pain beginning this morning around 9AM. Pt states that it feels like there is something in his right nostril that is affecting his breathing. Pt works as a Education administrator and believes exposure to dust and fumes could be causing his symptoms. He denies rhinorrhea, sore throat, ear pain, SOB, or any other associated symptoms. [facility].  Past Medical History  Diagnosis Date  . Herpes   . Shingles    History reviewed. No pertinent past surgical history. Family History  Problem Relation Age of Onset  . Cancer Father    Social History  Substance Use Topics  . Smoking status: Former Games developer  . Smokeless tobacco: None  . Alcohol Use: No    Review of Systems  HENT: Negative for ear pain, rhinorrhea and sore throat.        Positive for right nasal pain.  Respiratory: Negative for shortness of breath.   All other systems reviewed and are negative.   Allergies  Penicillins and Amoxicillin  Home Medications   Prior to Admission medications   Medication Sig Start Date End Date Taking? Authorizing Provider   acyclovir (ZOVIRAX) 400 MG tablet Take 1 tablet (400 mg total) by mouth 5 (five) times daily. 03/16/15   Cheri Fowler, PA-C  ibuprofen (ADVIL,MOTRIN) 600 MG tablet Take 1 tablet (600 mg total) by mouth every 6 (six) hours as needed. 11/23/14   Zadie Rhine, MD  magic mouthwash SOLN Take 5 mLs by mouth 3 (three) times daily as needed for mouth pain. 03/16/15   Cheri Fowler, PA-C  meloxicam (MOBIC) 15 MG tablet Take 1 tablet (15 mg total) by mouth daily. For tendonitis 02/24/15   Ambrose Finland, NP  terbinafine (LAMISIL AT) 1 % cream Apply 1 application topically 2 (two) times daily. 10/17/14   [name], NP   BP 108/74 mmHg  Pulse 77  Temp(Src) 98.5 F (36.9 C) (Oral)  Resp 16  Ht  (1.651 m)  Wt 192 lb 8 oz (87.317 kg)  BMI 32.03 kg/m2  SpO2 99% Physical Exam  Constitutional: He is oriented to person, place, and time. He appears well-developed and well-nourished. No distress.  HENT:  Head: Normocephalic and atraumatic.  Irritation and minimal mucosal edema in the right nare.  Eyes: Conjunctivae and EOM are normal.  Neck: Neck supple. No tracheal deviation present.  Cardiovascular: Normal rate.   Pulmonary/Chest: Effort normal. No respiratory distress.  Musculoskeletal: Normal range of motion.  Neurological: He is alert and oriented to person, place, and time.  Skin: Skin is warm and dry.  Psychiatric:  He has a normal mood and affect. His behavior is normal.  Nursing note and vitals reviewed.   ED Course  Procedures (including critical care time)  DIAGNOSTIC STUDIES: Oxygen Saturation is 99% on RA, normal by my interpretation.    COORDINATION OF CARE: 11:49 PM Discussed treatment plan with pt at bedside and pt agreed to plan.  Labs Review Labs Reviewed - No data to display  Imaging Review No results found. I have personally reviewed and evaluated these images and lab results as part of my medical decision-making.   EKG Interpretation None      MDM   Final  diagnoses:  None  Nasal irritation and mucosal edema. ? Nasal allergies. Symptomatic care instructions provided. Return precautions discussed. [facility]  I personally performed the services described in this documentation, which was scribed in my presence. The recorded information has been reviewed and is accurate.   Felicie Mornavid Danielys Madry, NP 06/03/15 0210  Tilden FossaElizabeth Rees, MD 06/05/15 1034

## 2015-06-03 MED ORDER — CETIRIZINE HCL 10 MG PO TABS: 10.0000 mg | ORAL_TABLET | Freq: Every day | ORAL | Status: DC

## 2015-06-03 NOTE — Discharge Instructions (Signed)
Fiebre de heno (Hay Fever) La fiebre de heno se trata de una reaccin alrgica a determinadas partculas que se encuentran en el aire. La fiebre de heno no puede transmitirse de persona a Social workerpersona. Este trastorno no puede curarse Tree surgeonpero puede controlarse. CAUSAS La causa de la fiebre del heno es algn factor que desencadena una reaccin alrgica alergenos). A continuacin se indican algunos ejemplos de alrgenos:   La Barnie Aldermanambrosa.  Las plumas.  La caspa de los Busseyanimales.  Polen del csped y de los arboles  El humo del cigarrillo.  El polvo del hogar.  La polucin. SNTOMAS  Estornudos.  Enrojecimiento y AMR Corporationpicazn en los ojos.  Picazn en el paladar.  Lagrimeo.  Garganta spera y dolorida.  Dolor de Turkmenistancabeza.  Disminucin del sentido del olfato y del gusto. DIAGNSTICO  El Hydrologistmdico realizar un examen fsico y le har preguntas sobre sus sntomas.Podrn indicarle pruebas de alergia para determinar exactamente qu causa la fiebre.  TRATAMIENTO  Los medicamentos de venta libre pueden ayudar a Asbury Automotive Groupaliviar los sntomas. Entre ellos se incluyen:  Antihistamnicos  Programme researcher, broadcasting/film/videoDescongestivos Alivian la congestin nasal.  Si estos medicamentos no le Merchant navy officerresultan efectivos, existen muchos otros nuevos que el profesional que lo asiste puede prescribirle.  Algunas personas se benefician con las inyecciones para la [place]alergia, cuando otros medicamentos no les Ingram Micro Incdan resultados. INSTRUCCIONES PARA EL CUIDADO EN EL HOGAR   En lo posible, evite los alrgenos que causan los sntomas.  Tome todos los United Parcelmedicamentos como le indic el mdico. SOLICITE ATENCIN MDICA SI:   Tiene sntomas graves de [place]alergia y los medicamentos que utiliza no lo mejoran.  El tratamiento fue efectivo una vez, pero tiene nuevos sntomas.  Siente congestin y presin en los senos nasales.  Le sube la fiebre o tiene dolor de Turkmenistancabeza.  Tiene una secrecin nasal espesa.  Sufre asma y la tos y las sibilancias empeoran. SOLICITE ATENCIN  MDICA DE INMEDIATO SI:  Presenta hinchazn en la lengua o los labios.  Tiene problemas para respirar.  Se siente mareado o como si se estuviera por The First Americandesmayar.  Tiene sudor fro.  Tiene fiebre.   Esta informacin no tiene Theme park managercomo fin reemplazar el consejo del mdico. Asegrese de hacerle al mdico cualquier pregunta que tenga.   Document Released: 01/04/2005 Document Revised: 03/29/2011 Elsevier Interactive Patient Education 2016 Elsevier Inc. Hay Fever Hay fever is an allergic reaction to particles in the air. It cannot be passed from person to person. It cannot be cured, but it can be controlled. CAUSES  Hay fever is caused by something that triggers an allergic reaction (allergens). The following are examples of allergens:  Ragweed.  Feathers.  Animal dander.  Grass and tree pollens.  Cigarette smoke.  House dust.  Pollution. SYMPTOMS   Sneezing.  Runny or stuffy nose.  Tearing eyes.  Itchy eyes, nose, mouth, throat, skin, or other area.  Sore throat.  Headache.  Decreased sense of smell or taste. DIAGNOSIS Your caregiver will perform a physical exam and ask questions about the symptoms you are having.Allergy testing may be done to determine exactly what triggers your hay fever.  TREATMENT   Over-the-counter medicines may help symptoms. These include:  Antihistamines.  Decongestants. These may help with nasal congestion.  Your caregiver may prescribe medicines if over-the-counter medicines do not work.  Some people benefit from allergy shots when other medicines are not helpful. HOME CARE INSTRUCTIONS   Avoid the allergen that is causing your symptoms, if possible.  Take all medicine as told by your caregiver.  SEEK MEDICAL CARE IF:   You have severe allergy symptoms and your current medicines are not helping.  Your treatment was working at one time, but you are now experiencing symptoms.  You have sinus congestion and pressure.  You develop  a fever or headache.  You have thick nasal discharge.  You have asthma and have a worsening cough and wheezing. SEEK IMMEDIATE MEDICAL CARE IF:   You have swelling of your tongue or lips.  You have trouble breathing.  You feel lightheaded or like you are going to faint.  You have cold sweats.  You have a fever.   This information is not intended to replace advice given to you by your health care provider. Make sure you discuss any questions you have with your health care provider.   Document Released: 01/04/2005 Document Revised: 03/29/2011 Document Reviewed: 07/17/2014 Elsevier Interactive Patient Education [employer]

## 2015-07-06 ENCOUNTER — Encounter (HOSPITAL_COMMUNITY): Payer: Self-pay

## 2015-07-06 ENCOUNTER — Emergency Department (HOSPITAL_COMMUNITY)
Admission: EM | Admit: 2015-07-06 | Discharge: 2015-07-06 | Disposition: A | Discharge status: Home / Self Care | Payer: Self-pay | Attending: Emergency Medicine | Admitting: Emergency Medicine

## 2015-07-06 DIAGNOSIS — J069 Acute upper respiratory infection, unspecified: Secondary | ICD-10-CM | POA: Insufficient documentation

## 2015-07-06 DIAGNOSIS — Z79899 Other long term (current) drug therapy: Secondary | ICD-10-CM | POA: Insufficient documentation

## 2015-07-06 DIAGNOSIS — Z87891 Personal history of nicotine dependence: Secondary | ICD-10-CM | POA: Insufficient documentation

## 2015-07-06 NOTE — Discharge Instructions (Signed)
Follow-up with your primary care provider in 2 days to be reevaluated for your symptoms. Take OTC Zyrtec daily and OTC Flonase as prescribed on the packaging. Follow up with your Primary care provider if your symptoms do not improve.   Return to emergency department if you experience fever, chills, bloody sputum, nausea, vomiting, headache.  Infeccin del tracto respiratorio superior, adultos (Upper Respiratory Infection, Adult) La mayora de las infecciones del tracto respiratorio superior son infecciones virales de las vas que llevan el aire a los pulmones. Un infeccin del tracto respiratorio superior afecta la nariz, la garganta y las vas respiratorias superiores. El tipo ms frecuente de infeccin del tracto respiratorio superior es la nasofaringitis, que habitualmente se conoce como "resfro comn". Las infecciones del tracto respiratorio superior siguen su curso y por lo general se curan solas. En la [employer], la infeccin del tracto respiratorio superior no requiere atencin [place], Biomedical engineer a veces, despus de una infeccin viral, puede surgir una infeccin bacteriana en las vas respiratorias superiores. Esto se conoce como infeccin secundaria. Las infecciones sinusales y en el odo medio son tipos frecuentes de infecciones secundarias en el tracto respiratorio superior. La neumona bacteriana tambin puede complicar un cuadro de infeccin del tracto respiratorio superior. Este tipo de infeccin puede empeorar el asma y la enfermedad pulmonar obstructiva crnica (EPOC). En algunos casos, estas complicaciones pueden requerir atencin mdica de emergencia y poner en peligro la vida.  CAUSAS Casi todas las infecciones del tracto respiratorio superior se deben a los virus. Un virus es un tipo de microbio que puede contagiarse de Neomia Dear persona a Educational psychologist.  FACTORES DE RIESGO Puede estar en riesgo de sufrir una infeccin del tracto respiratorio superior si:   Fuma.  Tiene una enfermedad  pulmonar o cardaca crnica.  Tiene debilitado el sistema de defensa (inmunitario) del cuerpo.  Es [address] o de edad muy [place].  Tiene asma o alergias nasales.  Trabaja en reas donde hay mucha gente o poca ventilacin.  Rudi Coco en una escuela o en un centro de atencin mdica. SIGNOS Y SNTOMAS  Habitualmente, los sntomas aparecen de 2a 3das despus de entrar en contacto con el virus del resfro. La mayora de las infecciones virales en el tracto respiratorio superior duran de 7a 10das. Sin embargo, las infecciones virales en el tracto respiratorio superior a causa del virus de la gripe pueden durar de 14a 18das y, habitualmente, son ms graves. Entre los sntomas se pueden incluir los siguientes:   Secrecin o congestin nasal.  Estornudos.  Tos.  Dolor de Advertising copywriter.  Dolor de [place].  Fatiga.  Grant Ruts.  Prdida del apetito.  Dolor en la frente, detrs de los ojos y por encima de los pmulos (dolor sinusal).  Dolores musculares. DIAGNSTICO  El mdico puede diagnosticar una infeccin del tracto respiratorio superior mediante los siguientes estudios:  Examen fsico.  Pruebas para verificar si los sntomas no se deben a otra afeccin, por ejemplo:  Faringitis estreptoccica.  Sinusitis.  Neumona.  Asma. TRATAMIENTO  Esta infeccin desaparece sola, con el tiempo. No puede curarse con medicamentos, pero a menudo se prescriben para aliviar los sntomas. Los medicamentos pueden ser tiles para lo siguiente:  Personal assistant fiebre.  Reducir la tos.  Aliviar la congestin nasal. INSTRUCCIONES PARA EL CUIDADO EN EL HOGAR   Tome los medicamentos solamente como se lo haya indicado el mdico.  A fin de Engineer, materials de garganta, haga grgaras con solucin salina templada o consuma caramelos para la tos, como se  lo haya indicado el mdico.  Use un humidificador de vapor clido o inhale el vapor de la ducha para aumentar la humedad del aire. Esto facilitar  la respiracin.  Beba suficiente lquido para Photographermantener la orina clara o de color amarillo plido.  Consuma sopas y otros caldos transparentes, y Abbott Laboratoriesalimntese bien.  Descanse todo lo que sea necesario.  Regrese al Aleen Campitrabajo cuando la temperatura se le haya normalizado o cuando el mdico lo autorice. Es posible que deba quedarse en su casa durante un tiempo prolongado, para no infectar a los dems. Tambin puede usar un barbijo y lavarse las manos con cuidado para Transport plannerevitar la propagacin del virus.  Aumente el uso del inhalador si tiene asma.  No consuma ningn producto que contenga tabaco, lo que incluye cigarrillos, tabaco de Theatre managermascar o Administrator, Civil Servicecigarrillos electrnicos. Si necesita ayuda para dejar de fumar, consulte al American Expressmdico. PREVENCIN  La mejor manera de protegerse de un resfro es mantener una higiene Rock Houseadecuada.   Evite el contacto oral o fsico con personas que tengan sntomas de resfro.  En caso de contacto, lvese las manos con frecuencia. No hay pruebas claras de que la vitaminaC, la vitaminaE, la equincea o el ejercicio reduzcan la probabilidad de Primary school teachercontraer un resfro. Sin embargo, siempre se recomienda Insurance account managerdescansar mucho, hacer ejercicio y Engineering geologistalimentarse bien.  SOLICITE ATENCIN MDICA SI:   Su estado empeora en lugar de mejorar.  Los medicamentos no Estate agentlogran controlar los sntomas.  Tiene escalofros.  La sensacin de falta de aire empeora.  Tiene mucosidad marrn o roja.  Tiene secrecin nasal amarilla o marrn.  Le duele la cara, especialmente al inclinarse hacia adelante.  Tiene fiebre.  Tiene los ganglios del cuello hinchados.  Siente dolor al tragar.  Tiene zonas blancas en la parte de atrs de la garganta. SOLICITE ATENCIN MDICA DE INMEDIATO SI:   Tiene sntomas intensos o persistentes de:  Dolor de [place].  Dolor de odos.  Dolor sinusal.  Dolor en el pecho.  Tiene enfermedad pulmonar crnica y cualquiera de estos sntomas:  Sibilancias.  Tos  prolongada.  Tos con sangre.  Cambio en la mucosidad habitual.  Presenta rigidez en el cuello.  Tiene cambios en:  La visin.  La audicin.  El pensamiento.  El Coyote Acresestado de nimo. ASEGRESE DE QUE:   Comprende estas instrucciones.  Controlar su afeccin.  Recibir ayuda de inmediato si no mejora o si empeora.   Esta informacin no tiene Theme park managercomo fin reemplazar el consejo del mdico. Asegrese de hacerle al mdico cualquier pregunta que tenga.   Document Released: 10/14/2004 Document Revised: 05/21/2014 Elsevier Interactive Patient Education [employer]2016 Elsevier Inc.

## 2015-07-06 NOTE — ED Notes (Signed)
Pt states he started having a sore throat yesterday and ears feel like they have pressure. Pt also has a cough. Pt denies being around anyone sick.

## 2015-07-06 NOTE — ED Provider Notes (Signed)
CSN: [id number]     Arrival date & time 07/06/15  4098 History   First MD Initiated Contact with Patient 07/06/15 [id number]     Chief Complaint  Patient presents with  . Sore Throat     (Consider location/radiation/quality/duration/timing/severity/associated sxs/prior Treatment) HPI   Patient is a 45 year old male with no similar past medical history presents the ED with 1 day of dry cough, itchy dry throat, sinus congestion, ear pressure and sinus pressure. He has not taken anything for this. Patient states daughter has similar symptoms. Patient states he just moved into a new home one week ago and he is concerned there is "humidity". Patient is concerned about mold. Patient denies sore throat, fever, chills, abdominal pain, nausea, vomiting, diarrhea.  Past Medical History  Diagnosis Date  . Herpes   . Shingles    History reviewed. No pertinent past surgical history. Family History  Problem Relation Age of Onset  . Cancer Father    Social History  Substance Use Topics  . Smoking status: Former Games developer  . Smokeless tobacco: None  . Alcohol Use: No    Review of Systems  Constitutional: Negative for fever and chills.  HENT: Positive for congestion, rhinorrhea and sinus pressure. Negative for hearing loss, sore throat and trouble swallowing.   Eyes: Negative for visual disturbance.  Respiratory: Positive for cough. Negative for chest tightness and shortness of breath.   Cardiovascular: Negative for chest pain.  Gastrointestinal: Negative for nausea, vomiting, abdominal pain and diarrhea.  Skin: Negative for rash.  Neurological: Negative for syncope and headaches.      Allergies  Penicillins and Amoxicillin  Home Medications   Prior to Admission medications   Medication Sig Start Date End Date Taking? Authorizing Provider  acyclovir (ZOVIRAX) 400 MG tablet Take 1 tablet (400 mg total) by mouth 5 (five) times daily. Patient not taking: Reported on 07/06/2015 03/16/15   Cheri Fowler, PA-C  cetirizine (ZYRTEC) 10 MG tablet Take 1 tablet (10 mg total) by mouth daily. Patient not taking: Reported on 07/06/2015 06/03/15   [name], NP  ibuprofen (ADVIL,MOTRIN) 600 MG tablet Take 1 tablet (600 mg total) by mouth every 6 (six) hours as needed. Patient not taking: Reported on 07/06/2015 11/23/14   Zadie Rhine, MD  magic mouthwash SOLN Take 5 mLs by mouth 3 (three) times daily as needed for mouth pain. Patient not taking: Reported on 07/06/2015 03/16/15   Cheri Fowler, PA-C  meloxicam (MOBIC) 15 MG tablet Take 1 tablet (15 mg total) by mouth daily. For tendonitis Patient not taking: Reported on 07/06/2015 02/24/15   Ambrose Finland, NP  terbinafine (LAMISIL AT) 1 % cream Apply 1 application topically 2 (two) times daily. Patient not taking: Reported on 07/06/2015 10/17/14   [name], NP   BP 121/73 mmHg  Pulse 88  Temp(Src) 98.2 F (36.8 C) (Oral)  Resp 16  SpO2 98% Physical Exam  Constitutional: He appears well-developed and well-nourished. No distress.  HENT:  Head: Normocephalic and atraumatic.  Right Ear: Tympanic membrane, external ear and ear canal normal.  Left Ear: Tympanic membrane, external ear and ear canal normal.  Nose: Mucosal edema and rhinorrhea present.  Mouth/Throat: Uvula is midline, oropharynx is clear and moist and mucous membranes are normal. No oropharyngeal exudate, posterior oropharyngeal edema, posterior oropharyngeal erythema or tonsillar abscesses.  Eyes: Conjunctivae are normal.  Neck: Normal range of motion.  Cardiovascular: Normal rate, regular rhythm and normal heart sounds.  Exam reveals no gallop and no friction rub.  No murmur heard. Pulmonary/Chest: Effort normal and breath sounds normal. No respiratory distress. He has no wheezes. He has no rales.  Musculoskeletal: Normal range of motion.  Neurological: He is alert. Coordination normal.  Skin: Skin is warm and dry. No rash noted. He is not diaphoretic.  Psychiatric: He has a  normal mood and affect. His behavior is normal.    ED Course  Procedures (including critical care time) Labs Review Labs Reviewed - No data to display  Imaging Review No results found. I have personally reviewed and evaluated these images and lab results as part of my medical decision-making.   EKG Interpretation None      MDM   Final diagnoses:  URI (upper respiratory infection)   Patients symptoms are consistent with URI, likely viral etiology or allergies. Daughter with similar symptoms so more likely viral URI. Pt is concerned about mold in his home so this could be allergy related. He just moved into this home one week ago. I gave the pt information on mold testing products from [employer]. Pt afebrile, VSS, lungs CTA, no trismus, no pharyngitis or concerns for PTA.   Discussed OTC treatment options. Pt will be discharged with symptomatic treatment.  Verbalizes understanding and is agreeable with plan. Pt is hemodynamically stable & in NAD prior to dc.     Jerre SimonJessica L Focht, PA 07/06/15 [id number]  Gerhard Munchobert Lockwood, MD 07/07/15 1752

## 2015-07-24 ENCOUNTER — Encounter (HOSPITAL_COMMUNITY): Payer: Self-pay | Admitting: Emergency Medicine

## 2015-07-24 ENCOUNTER — Ambulatory Visit (HOSPITAL_COMMUNITY)
Admission: EM | Admit: 2015-07-24 | Discharge: 2015-07-24 | Disposition: A | Discharge status: Home / Self Care | Payer: Self-pay | Attending: Internal Medicine | Admitting: Internal Medicine

## 2015-07-24 DIAGNOSIS — J45909 Unspecified asthma, uncomplicated: Secondary | ICD-10-CM

## 2015-07-24 MED ORDER — LORATADINE 10 MG PO TABS: 10.0000 mg | ORAL_TABLET | Freq: Every day | ORAL | Status: DC

## 2015-07-24 MED ORDER — ALBUTEROL SULFATE HFA 108 (90 BASE) MCG/ACT IN AERS: 2.0000 | INHALATION_SPRAY | RESPIRATORY_TRACT | Status: DC | PRN

## 2015-07-24 NOTE — Discharge Instructions (Signed)
Asma, broncoespasmo agudo °(Asthma, Acute Bronchospasm) °El broncoespasmo agudo causado por el asma también se conoce como crisis de asma. Broncoespasmo significa que las vías respiratorias se han estrechado. La causa del estrechamiento es la inflamación y la constricción de los músculos de las vías respiratorias (bronquios) que se encuentran en los pulmones. Esto puede dificultar la respiración o provocarle sibilancias y tos. °CAUSAS °Los desencadenantes posibles son: °· La caspa que eliminan los animales de la piel, el pelo o las plumas de los animales. °· Los ácaros que se encuentran en el polvo de la casa. °· Cucarachas. °· El polen de los árboles o el césped. °· Moho. °· El humo del cigarrillo o del tabaco °· Sustancias contaminantes como el polvo, limpiadores hogareños, aerosoles (como los aerosoles para el cabello), vapores de pintura, sustancias químicas fuertes u olores intensos. °· El aire frío o cambios climáticos. El aire frío puede causar inflamación. El viento aumenta la cantidad de moho y polen del aire. °· Emociones fuertes, como llorar o reír intensamente. °· Estrés. °· Ciertos medicamentos como la aspirina o betabloqueantes. °· Los sulfitos que se encuentran en las comidas y bebidas como frutas secas y el vino. °· Enfermedades infecciosas o inflamatorias, como la gripe, el resfrío o la inflamación de las membranas nasales (rinitis). °· El reflujo gastroesofágico (ERGE). El reflujo gastroesofágico es una afección en la que los ácidos estomacales vuelven al esófago. °· Los ejercicios o actividades extenuantes. °SIGNOS Y SÍNTOMAS  °· Sibilancias. °· Tos intensa, especialmente por la noche. °· Opresión en el pecho. °· Falta de aire. °DIAGNÓSTICO  °El médico le hará una historia clínica y le hará un examen físico. Le indicarán radiografías o análisis de sangre para buscar otras causas de los síntomas u otras enfermedades que puedan desencadenar una crisis de asma.  °TRATAMIENTO  °El tratamiento está  dirigido a reducir la inflamación y abrir las vías respiratorias en los pulmones. La mayor parte de las crisis asmáticas se tratan con medicamentos por vía inhalatoria. Entre ellos se incluyen los medicamentos de alivio rápido o medicamentos de rescate (como los broncodilatadores) y los medicamentos de control (como los corticoides inhalados). Estos medicamentos se administran a través de un inhalador o de un nebulizador. Los corticoides sistémicos por vía oral o por vía intravenosa también se administran para reducir la inflamación cuando un ataque es moderado o grave. Los antibióticos se indican solo si hay infección bacteriana.  °INSTRUCCIONES PARA EL CUIDADO EN EL HOGAR  °· Reposo. °· Beba líquido en abundancia. Esto ayuda a diluir la mucosidad y a eliminarla fácilmente. Solo consuma productos con cafeína moderadamente y no consuma alcohol hasta que se haya recuperado de la enfermedad. °· No fume. Evite la exposición al humo de otros fumadores. °· Usted tiene un rol fundamental en mantener su buena salud. Evite la exposición a lo que le ocasiona los problemas respiratorios. °· Mantenga los medicamentos actualizados y al alcance. Siga cuidadosamente el plan de tratamiento del médico. °· Utilice los medicamentos tal como se le indicó. °· Cuando haya mucho polen o polución, mantenga las ventanas cerradas y use el aire acondicionado o vaya a lugares con aire acondicionado. °· El asma requiere atención médica exhaustiva. Concurra a los controles según las indicaciones. Si tiene un embarazo de más de 24 semanas y le han recetado medicamentos nuevos, coméntelo con su obstetra y cuál es su evolución. Concurra a las consultas de control con su médico según las indicaciones. °· Después de recuperarse de la crisis de asma, haga una cita con   el médico para conocer cómo puede reducir la probabilidad de futuros ataques. Si no cuenta con un médico para que controle su asma, haga una cita con un médico de atención primaria para  hablar de esta enfermedad. °SOLICITE ATENCIÓN MÉDICA DE INMEDIATO SI:  °· Empeora. °· Tiene dificultad para respirar. Si la dificultad es intensa comuníquese con el servicio de emergencias de su localidad (911 en los [place]). °· Siente dolor o molestias en el pecho. °· Tiene vómitos. °· No puede retener los líquidos. °· Elimina una expectoración verde, amarilla, amarronada o sanguinolenta. °· Tiene fiebre y los síntomas empeoran repentinamente. °· Presenta dificultad para tragar. °ASEGÚRESE DE QUE:  °· Comprende estas instrucciones. °· Controlará su afección. °· Recibirá ayuda de inmediato si no mejora o si empeora. °  °Esta información no tiene como fin reemplazar el consejo del médico. Asegúrese de hacerle al médico cualquier pregunta que tenga. °  °Document Released: 04/22/2008 Document Revised: 01/09/2013 °Elsevier Interactive Patient Education ©2016 Elsevier Inc. ° °

## 2015-07-24 NOTE — ED Notes (Signed)
Pt d/c by Dr. Sheryle Hailiamond

## 2015-07-24 NOTE — ED Notes (Signed)
C/o constant CP onset 15 days associated w/a prod cough [facility] for similar sx A&O x4... NAD

## 2015-07-24 NOTE — ED Provider Notes (Signed)
CSN: [id number]     Arrival date & time 07/24/15  1934 History   First MD Initiated Contact with Patient 07/24/15 2043     Chief Complaint  Patient presents with  . Chest Pain   (Consider location/radiation/quality/duration/timing/severity/associated sxs/prior Treatment) HPI Comments: The patient reports more than 2 weeks of chest pain and at least 8 days of cough. He states that he occasionally produces white phlegm with cough. His chest pain is circumferential and has been constant. He denies nausea, vomiting or diaphoresis. 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 He also relates his cough to the humidity in his house and the temperature of the air conditioning. He has placed the humidifiers as well as air. Vacation systems in the house. He and his daughter have similar symptoms. His began with a runny nose and tickling in his ears.  Patient is a 45 y.o. male presenting with chest pain. The history is provided by the patient.  Chest Pain Chest pain location: Circumferentially starting substernal. Pain quality: burning   Pain radiates to:  Does not radiate Timing:  Constant Chronicity:  Recurrent Context comment:  Any time Relieved by:  Nothing Worsened by:  Coughing Associated symptoms: shortness of breath (Sometimes)   Associated symptoms: no fever, no nausea, no palpitations and not vomiting     Past Medical History  Diagnosis Date  . Herpes   . Shingles    History reviewed. No pertinent past surgical history. Family History  Problem Relation Age of Onset  . Cancer Father    Social History  Substance Use Topics  . Smoking status: Former Games developermoker  . Smokeless tobacco: None  . Alcohol Use: No    Review of Systems  Constitutional: Negative for fever.  Respiratory: Positive for shortness of breath (Sometimes).   Cardiovascular: Positive for chest pain. Negative for palpitations.  Gastrointestinal: Negative for nausea and  vomiting.  All other systems reviewed and are negative.   Allergies  Penicillins and Amoxicillin  Home Medications   Prior to Admission medications   Medication Sig Start Date End Date Taking? Authorizing Provider  acyclovir (ZOVIRAX) 400 MG tablet Take 1 tablet (400 mg total) by mouth 5 (five) times daily. Patient not taking: Reported on 07/06/2015 03/16/15   Cheri FowlerKayla Rose, PA-C  albuterol (PROVENTIL HFA;VENTOLIN HFA) 108 (90 Base) MCG/ACT inhaler Inhale 2 puffs into the lungs every 4 (four) hours as needed for wheezing or shortness of breath. 07/24/15   Arnaldo NatalMichael S Trinette Vera, MD  cetirizine (ZYRTEC) 10 MG tablet Take 1 tablet (10 mg total) by mouth daily. Patient not taking: Reported on 07/06/2015 06/03/15   Felicie Mornavid Smith, NP  ibuprofen (ADVIL,MOTRIN) 600 MG tablet Take 1 tablet (600 mg total) by mouth every 6 (six) hours as needed. Patient not taking: Reported on 07/06/2015 11/23/14   Zadie Rhineonald Wickline, MD  loratadine (CLARITIN) 10 MG tablet Take 1 tablet (10 mg total) by mouth daily. 07/24/15   Arnaldo NatalMichael S Yani Coventry, MD  magic mouthwash SOLN Take 5 mLs by mouth 3 (three) times daily as needed for mouth pain. Patient not taking: Reported on 07/06/2015 03/16/15   Cheri FowlerKayla Rose, PA-C  meloxicam (MOBIC) 15 MG tablet Take 1 tablet (15 mg total) by mouth daily. For tendonitis Patient not taking: Reported on 07/06/2015 02/24/15   Ambrose FinlandValerie A Keck, NP  terbinafine (LAMISIL AT) 1 % cream Apply 1 application topically 2 (two) times daily. Patient not taking: Reported on 07/06/2015 10/17/14   Felicie Mornavid Smith, NP  Meds Ordered and Administered this Visit  Medications - No data to display  BP 98/56 mmHg  Pulse 109  Temp(Src) 98.9 F (37.2 C) (Oral)  Resp 16  SpO2 99% No data found.   Physical Exam  Constitutional: He is oriented to person, place, and time. He appears well-developed and well-nourished. No distress.  HENT:  Head: Normocephalic and atraumatic.  Mouth/Throat: Oropharynx is clear and moist.  Eyes: Conjunctivae  and EOM are normal. Pupils are equal, round, and reactive to light. No scleral icterus.  Neck: Normal range of motion. Neck supple. No JVD present. No tracheal deviation present. No thyromegaly present.  Cardiovascular: Normal rate, regular rhythm and normal heart sounds.  Exam reveals no gallop and no friction rub.   No murmur heard. Pulmonary/Chest: Effort normal and breath sounds normal. No respiratory distress.  Abdominal: Soft. Bowel sounds are normal. He exhibits no distension. There is no tenderness.  Musculoskeletal: Normal range of motion. He exhibits no edema.  Lymphadenopathy:    He has no cervical adenopathy.  Neurological: He is alert and oriented to person, place, and time. No cranial nerve deficit.  Skin: Skin is warm and dry. No rash noted. No erythema.  Psychiatric: He has a normal mood and affect. His behavior is normal. Judgment and thought content normal.  Vitals reviewed.   ED Course  Procedures (including critical care time)  Labs Review Labs Reviewed - No data to display  Imaging Review No results found.   Visual Acuity Review  Right Eye Distance:   Left Eye Distance:   Bilateral Distance:    Right Eye Near:   Left Eye Near:    Bilateral Near:         MDM   1. Asthma due to environmental allergies    Allergens likely a trigger to bronchospasm. The patient may be susceptible to volatile organic compounds found in pain plus or minus pollens in the air. Will prescribe albuterol as needed for coughing and shortness of breath. Antihistamine for trigger prevention.    Arnaldo NatalMichael S Laurianne Floresca, MD 07/24/15 2136

## 2015-08-15 ENCOUNTER — Ambulatory Visit (HOSPITAL_COMMUNITY)
Admission: EM | Admit: 2015-08-15 | Discharge: 2015-08-15 | Disposition: A | Discharge status: Home / Self Care | Payer: Self-pay | Attending: Emergency Medicine | Admitting: Emergency Medicine

## 2015-08-15 ENCOUNTER — Ambulatory Visit (INDEPENDENT_AMBULATORY_CARE_PROVIDER_SITE_OTHER): Payer: Self-pay

## 2015-08-15 DIAGNOSIS — R059 Cough, unspecified: Secondary | ICD-10-CM

## 2015-08-15 DIAGNOSIS — R05 Cough: Secondary | ICD-10-CM

## 2015-08-15 MED ORDER — BENZONATATE 200 MG PO CAPS: 200.0000 mg | ORAL_CAPSULE | Freq: Three times a day (TID) | ORAL | 0 refills | Status: DC | PRN

## 2015-08-15 MED ORDER — IPRATROPIUM BROMIDE 0.06 % NA SOLN: 2.0000 | Freq: Four times a day (QID) | NASAL | 0 refills | Status: DC

## 2015-08-15 NOTE — Discharge Instructions (Signed)
Start the nasal spray. Continue the loratadine. If that does not work, then you can switch to the cetirizine/Zyrtec. Also start taking some Pepcid, 20 mg twice a day. This is available over-the-counter. Cough may also be due to reflux. Follow-up with your primary care physician in one week if you're not getting better.

## 2015-08-15 NOTE — ED Provider Notes (Signed)
HPI  SUBJECTIVE:  Gabriel Ball is a 45 y.o. male who presents with 1 month of nonproductive cough. He states his symptoms are worse with going him cold air, no alleviating factors. He has been doing albuterol 5 times a day, Claritin/ loratadine without any improvement. He states the cough has not changed since it started one month ago. He reports night sweats, nasal congestion, and a tickle in the back of his throat. He also reports some belching. He denies wheezing, chest pain, shortness of breath, fevers. No unintentional weight loss, hemoptysis. No international travel. No water brash or burning chest pain. He states that the cough is not really associated with pollen, respiratory irritants or paint fumes. He is a Education administrator. States that he is sleeping well at night. He denies lower extremity edema, nocturia, orthopnea, weight gain. Past medical history negative for cancer TB GERD asthma smoking CHF. He is not on an ACE inhibitor. PMD: Dr. Harrison Mons  Patient was seen in this urgent care and 7/26 for chest pain and cough, was thought to have asthma/bronchospasm and was prescribed albuterol and advised to start antihistamines.  Past Medical History:  Diagnosis Date  . Herpes   . Shingles     No past surgical history on file.  Family History  Problem Relation Age of Onset  . Cancer Father     Social History  Substance Use Topics  . Smoking status: Former Games developer  . Smokeless tobacco: Not on file  . Alcohol use No    No current facility-administered medications for this encounter.   Current Outpatient Prescriptions:  .  acyclovir (ZOVIRAX) 400 MG tablet, Take 1 tablet (400 mg total) by mouth 5 (five) times daily., Disp: 25 tablet, Rfl: 0 .  albuterol (PROVENTIL HFA;VENTOLIN HFA) 108 (90 Base) MCG/ACT inhaler, Inhale 2 puffs into the lungs every 4 (four) hours as needed for wheezing or shortness of breath., Disp: 1 Inhaler, Rfl: 2 .  cetirizine (ZYRTEC) 10 MG tablet, Take 1 tablet (10 mg  total) by mouth daily., Disp: 10 tablet, Rfl: 0 .  ibuprofen (ADVIL,MOTRIN) 600 MG tablet, Take 1 tablet (600 mg total) by mouth every 6 (six) hours as needed., Disp: 30 tablet, Rfl: 0 .  loratadine (CLARITIN) 10 MG tablet, Take 1 tablet (10 mg total) by mouth daily., Disp: 30 tablet, Rfl: 3 .  meloxicam (MOBIC) 15 MG tablet, Take 1 tablet (15 mg total) by mouth daily. For tendonitis, Disp: 30 tablet, Rfl: 1 .  terbinafine (LAMISIL AT) 1 % cream, Apply 1 application topically 2 (two) times daily., Disp: 30 g, Rfl: 0 .  benzonatate (TESSALON) 200 MG capsule, Take 1 capsule (200 mg total) by mouth 3 (three) times daily as needed for cough., Disp: 30 capsule, Rfl: 0 .  ipratropium (ATROVENT) 0.06 % nasal spray, Place 2 sprays into both nostrils 4 (four) times daily. 3-4 times/ day, Disp: 15 mL, Rfl: 0  Allergies  Allergen Reactions  . Penicillins   . Amoxicillin Itching    Pruritic rash     ROS  As noted in HPI.   Physical Exam  BP 111/66 (BP Location: Left Arm)   Pulse 89   Temp 99.3 F (37.4 C) (Oral)   Resp 12   SpO2 97%   Constitutional: Well developed, well nourished, no acute distress Eyes:  EOMI, conjunctiva normal bilaterally HENT: Normocephalic, atraumatic,mucus membranes moist. Positive cobblestoning. Mild postnasal drip. Respiratory: Normal inspiratory effort. Good air movement, lungs clear bilaterally Cardiovascular: Normal rate regular rhythm no murmurs rubs  gallops GI: nondistended skin: No rash, skin intact Musculoskeletal: no deformities no lower extremity edema, calves nontender, symmetric Neurologic: Alert & oriented x 3, no focal neuro deficits Psychiatric: Speech and behavior appropriate   ED Course   Medications - No data to display  Orders Placed This Encounter  Procedures  . DG Chest 2 View    Standing Status:   Standing    Number of Occurrences:   1    Order Specific Question:   Reason for Exam (SYMPTOM  OR DIAGNOSIS REQUIRED)    Answer:    cough x 1 month r/o PNA pulm edema TB    No results found for this or any previous visit (from the past 24 hour(s)). Dg Chest 2 View  Result Date: 08/15/2015 CLINICAL DATA:  Nonproductive cough. EXAM: CHEST  2 VIEW COMPARISON:  04/05/2005 FINDINGS: The heart size and mediastinal contours are within normal limits. Both lungs are clear. The visualized skeletal structures are unremarkable. IMPRESSION: Normal exam. Electronically Signed   By: Francene Boyers M.D.   On: 08/15/2015 13:17   ED Clinical Impression  Cough  ED Assessment/Plan  Previous records reviewed. See history of present illness   Checking chest x-ray  to rule out pneumonia, mass, pleural effusion, pulmonary edema, TB. Think that most likely his symptoms are from postnasal drip with a component of airway irritation vs bronchospasm. His lungs are otherwise clear.   Imaging independently reviewed. Lungs clear bilaterally. See radiology report for details.  Plan to start with Tessalon Perles, Atrovent nasal spray, and his cough  this may also be due to some reflux. Advised Pepcid. Advised follow up with primary care physician if these medicines do not work. Discussed imaging, medical decision-making, plan for follow-up using a translator with patient. He agrees with plan.   *This clinic note was created using Dragon dictation software. Therefore, there may be occasional mistakes despite careful proofreading.  ?   Domenick Gong, MD 08/15/15 1330

## 2015-08-15 NOTE — ED Triage Notes (Signed)
C/o coughing and headache for two weeks  States cough is not productive Denies any watery eyes or ear problems

## 2015-08-26 ENCOUNTER — Ambulatory Visit: Payer: Self-pay | Attending: Internal Medicine | Admitting: Internal Medicine

## 2015-08-26 ENCOUNTER — Other Ambulatory Visit: Payer: Self-pay

## 2015-08-26 ENCOUNTER — Encounter: Payer: Self-pay | Admitting: Internal Medicine

## 2015-08-26 VITALS — BP 104/69 | HR 67 | Temp 98.7°F | Resp 16 | Wt 198.0 lb

## 2015-08-26 DIAGNOSIS — R05 Cough: Secondary | ICD-10-CM

## 2015-08-26 DIAGNOSIS — R053 Chronic cough: Secondary | ICD-10-CM

## 2015-08-26 MED ORDER — OMEPRAZOLE 40 MG PO CPDR: 40.0000 mg | DELAYED_RELEASE_CAPSULE | Freq: Every day | ORAL | 3 refills | Status: DC

## 2015-08-26 MED ORDER — ACYCLOVIR 400 MG PO TABS: 400.0000 mg | ORAL_TABLET | Freq: Every day | ORAL | 0 refills | Status: DC

## 2015-08-26 MED ORDER — ALBUTEROL SULFATE HFA 108 (90 BASE) MCG/ACT IN AERS: 2.0000 | INHALATION_SPRAY | RESPIRATORY_TRACT | 2 refills | Status: DC | PRN

## 2015-08-26 MED ORDER — LORATADINE 10 MG PO TABS: 10.0000 mg | ORAL_TABLET | Freq: Every day | ORAL | 3 refills | Status: DC

## 2015-08-26 MED ORDER — MOMETASONE FURO-FORMOTEROL FUM 100-5 MCG/ACT IN AERO: 2.0000 | INHALATION_SPRAY | Freq: Two times a day (BID) | RESPIRATORY_TRACT | 3 refills | Status: DC

## 2015-08-26 NOTE — Progress Notes (Signed)
Gabriel Ball, is a 45 y.o. male  XBJ:[id number]:[id number]  HYQ:[id number]  DOB - 18-Feb-1970  CC:  Chief Complaint  Patient presents with  . Follow-up    ED       HPI: Gabriel Ball is a 45 y.o. male here today to establish medical care, Last seen on Feb 2017 in our clinic.  Since then he's visited the ED about 5 times for multiple complaints including nasal irritation. Herpes infection of the mouth. Viral URI as well as chronic cough since been ongoing for the last month or so. He is most recent ED visit was on 08/15/2015 with diagnosis of cough as well. Chest x-ray done at that time was unremarkable. He's been recommended to start a acid reflux medication and his inhaler. Also, allergy medicine including Claritin and Zyrtec. He states he really hasn't taken any of the meds.  He complains of a chronic nonproductive cough for about a month and a half now. Initially he had some productive sputum, but for the last month or so, nonproductive. No fevers, no chills.  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  He denies rhinorrhea. Denies sore throat. Denies night sweats.  Of note, he is also complaining of incidental irritation around the corona of his penis as well. Denies any burning irritation, dysuria, hematuria.  Denies any abnormal urethral discharge.  Patient has No headache, No chest pain, No abdominal pain - No Nausea, No new weakness tingling or numbness, No Cough - SOB.  Interpreter was used to communicate directly with patient for the entire encounter including providing detailed patient instructions.   Review of Systems: Per history of present illness, otherwise all systems reviewed, essentially unremarkable except for above.  Allergies  Allergen Reactions  . Penicillins   . Amoxicillin Itching    Pruritic rash   Past Medical History:  Diagnosis Date  . Herpes   . Shingles    Current Outpatient Prescriptions on File  Prior to Visit  Medication Sig Dispense Refill  . benzonatate (TESSALON) 200 MG capsule Take 1 capsule (200 mg total) by mouth 3 (three) times daily as needed for cough. 30 capsule 0  . ipratropium (ATROVENT) 0.06 % nasal spray Place 2 sprays into both nostrils 4 (four) times daily. 3-4 times/ day 15 mL 0  . cetirizine (ZYRTEC) 10 MG tablet Take 1 tablet (10 mg total) by mouth daily. (Patient not taking: Reported on 08/26/2015) 10 tablet 0  . ibuprofen (ADVIL,MOTRIN) 600 MG tablet Take 1 tablet (600 mg total) by mouth every 6 (six) hours as needed. (Patient not taking: Reported on 08/26/2015) 30 tablet 0  . meloxicam (MOBIC) 15 MG tablet Take 1 tablet (15 mg total) by mouth daily. For tendonitis (Patient not taking: Reported on 08/26/2015) 30 tablet 1  . terbinafine (LAMISIL AT) 1 % cream Apply 1 application topically 2 (two) times daily. (Patient not taking: Reported on 08/26/2015) 30 g 0  . [DISCONTINUED] clonazePAM (KLONOPIN) 1 MG tablet Take 1 tablet (1 mg total) by mouth 2 (two) times daily. (Patient not taking: Reported on 08/13/2014) 30 tablet 0  . [DISCONTINUED] traZODone (DESYREL) 50 MG tablet Take 2 tablets (100 mg total) by mouth at bedtime. (Patient not taking: Reported on 08/13/2014) 60 tablet 1   No current facility-administered medications on file prior to visit.    Family History  Problem Relation Age of Onset  . Cancer Father    Social History  Social History  . Marital status: Single    Spouse name: N/A  . Number of children: N/A  . Years of education: N/A   Occupational History  . Not on file.   Social History Main Topics  . Smoking status: Former Games developer  . Smokeless tobacco: Not on file  . Alcohol use No  . Drug use: No  . Sexual activity: Not on file   Other Topics Concern  . Not on file   Social History Narrative  . No narrative on file    Objective:   Vitals:   08/26/15 1611  BP: 104/69  Pulse: 67  Resp: 16  Temp: 98.7 F (37.1 C)    Filed Weights    08/26/15 1611  Weight: 198 lb (89.8 kg)    BP Readings from Last 3 Encounters:  08/26/15 104/69  08/15/15 111/66  07/24/15 98/56    Physical Exam: Constitutional: Patient appears well-developed and well-nourished. No distress. AAOx3, pleasant. HENT: Normocephalic, atraumatic, External right and left ear normal. Oropharynx is clear and moist.  bilat TMs clear. No cobblestoning in o/p. Eyes: Conjunctivae and EOM are normal. PERRL, no scleral icterus. Neck: Normal ROM. Neck supple. No JVD.  CVS: RRR, S1/S2 +, no murmurs, no gallops, no carotid bruit.  Pulmonary: Effort normal but diminished bs throughout;, no stridor, rhonchi, wheezes, rales.  Abdominal: Soft. BS +, no distension, tenderness Musculoskeletal: Normal range of motion. No edema and no tenderness.  Penis - w/ mild erythematous irritation around the corrona of penis, no open sores/ no other lesions noted. LE: bilat/ no c/c/e, pulses 2+ bilateral. Neuro: Alert. muscle tone coordination wnl. No cranial nerve deficit grossly. Skin: Skin is warm and dry. No rash noted. Not diaphoretic. No erythema. No pallor. Psychiatric: Normal mood and affect. Behavior, judgment, thought content normal.  Lab Results  Component Value Date   WBC 5.8 02/23/2014   HGB 14.3 02/23/2014   HCT 40.3 02/23/2014   MCV 80.8 02/23/2014   PLT 198 02/23/2014   Lab Results  Component Value Date   CREATININE 0.60 02/23/2014   BUN 10 02/23/2014   NA 140 02/23/2014   K 3.6 02/23/2014   CL 109 02/23/2014   CO2 28 02/23/2014    Lab Results  Component Value Date   HGBA1C 5.10 10/31/2014   Lipid Panel  No results found for: CHOL, TRIG, HDL, CHOLHDL, VLDL, LDLCALC     Depression screen [facility] 2/9 08/13/2014 11/19/2013  Decreased Interest 3 0  Down, Depressed, Hopeless 0 0  PHQ - 2 Score 3 0  Altered sleeping 3 -  Tired, decreased energy 2 -  Change in appetite 0 -  Feeling bad or failure about yourself  0 -  Trouble concentrating 3 -  Moving  slowly or fidgety/restless 0 -  Suicidal thoughts 0 -  PHQ-9 Score 11 -    CLINICAL DATA:  Nonproductive cough. EXAM: CHEST  2 VIEW COMPARISON:  04/05/2005 FINDINGS: The heart size and mediastinal contours are within normal limits. Both lungs are clear. The visualized skeletal structures are unremarkable. IMPRESSION: Normal exam. Electronically Signed   By: Francene Boyers M.D.   On: 08/15/2015 13:17  Assessment and plan:   1. Chronic cough Could multifactorial including environmental asthma, seasonal allergies, acid reflux. Recommend taking Claritin 10 mg by mouth daily, started him on Prilosec 40 mg by mouth daily, and I started him on a inhaler with a delay 100/5 g 2 puffs 2 times a day. I also started him on Proventil  when necessary as needed for rescue.   2. ?herpese flare vs ballanitis. Recd trial of acyclovir first   Return in about 3 weeks (around 09/16/2015) for brochospasms /penile infection. - f/u 2-3 wks/prn   The patient was given clear instructions to go to ER or return to medical center if symptoms don't improve, worsen or new problems develop. The patient verbalized understanding. The patient was told to call to get lab results if they haven't heard anything in the next week.    This note has been created with Education officer, environmental. Any transcriptional errors are unintentional.   Pete Glatter, MD, MBA/MHA [facility] [place], [place] [phone]   08/26/2015, 5:04 PM

## 2015-08-26 NOTE — Patient Instructions (Signed)
Broncoespasmo - Adultos (Bronchospasm, Adult) El broncoespasmo se produce cuando los conductos que transportan el aire desde y hacia los pulmones (vas respiratorias) sufren un espasmo o se estrechan. Durante un broncoespasmo es difcil respirar. Esto se debe a que las vas respiratorias se estrechan. El broncoespasmo puede ser desencadenado por:  Alergias. Puede ser a animales, polen, alimentos o moho.  Infeccin. Esta es una causa frecuente de broncoespasmo.  Actividad fsica.  Agentes irritantes. Por ejemplo, polucin, humo de cigarrillos, olores fuertes, aerosoles y vapores de pintura.  Los cambios climticos.  Estrs.  Estar emocionado. CUIDADOS EN EL HOGAR   Cuente siempre con un plan para pedir ayuda. Sepa cundo debe llamar al mdico y a los servicios de emergencia de su localidad (911 en [place] Sepa dnde puede acceder a un servicio de emergencias.  Solo tome los medicamentos que le haya indicado su mdico.  Si le indicaron el uso de un inhalador o nebulizador, consulte a su mdico para que le explique cmo usarlo correctamente. Siempre use un espaciador con el inhalador, si le proporcionaron uno  Mantenga la calma durante el ataque. Trate de relajarse y respire ms lentamente.  Controle el ambiente de su casa:  Cambie el filtro de la calefaccin y el aire acondicionado al menos una vez al mes.  Limite el uso de hogares o estufas a lea.  No fume. No permita que fumen en su casa.  Evite la exposicin a perfumes y fragancias.  Elimine las plagas (como cucarachas y ratones) y sus excrementos.  Elimine las plantas si observa moho en ellas.  Mantenga su casa limpia y libre de polvo.  Reemplace las alfombras por pisos de madera, baldosas o vinilo. Las alfombras pueden retener la caspa de los animales y el polvo.  Use almohadas, mantas y cubre colchones antialrgicos.  Lave las sbanas y las mantas todas las semanas con agua caliente. Squelas en una  secadora.  Use mantas de polister o algodn.  Lvese las manos con frecuencia. SOLICITE AYUDA SI:  Tiene dolores musculares.  Siente dolor en el pecho.  El catarro espeso que elimina (esputo) cambia de un color claro o blanco a un color amarillo, verde, gris o sanguinolento.  El catarro espeso que elimina se hace ms espeso.  Tiene algn problema que pueda relacionarse con los medicamentos que est tomando como:  Una erupcin cutnea.  Picazn.  Hinchazn.  Problemas para respirar. SOLICITE AYUDA DE INMEDIATO SI:  No puede respirar normalmente.  No puede dejar de toser.  El tratamiento no lo ayuda a respirar mejor.  Siente un dolor muy intenso en el pecho. ASEGRESE DE QUE:   Comprende estas instrucciones.  Controlar su afeccin.  Recibir ayuda de inmediato si no mejora o si empeora.   Esta informacin no tiene como fin reemplazar el consejo del mdico. Asegrese de hacerle al mdico cualquier pregunta que tenga.   Document Released: 02/06/2010 Document Revised: 01/25/2014 Elsevier Interactive Patient Education 2016 Elsevier Inc.  

## 2015-08-30 ENCOUNTER — Encounter (HOSPITAL_COMMUNITY): Payer: Self-pay

## 2015-08-30 ENCOUNTER — Emergency Department (HOSPITAL_COMMUNITY)
Admission: EM | Admit: 2015-08-30 | Discharge: 2015-08-30 | Disposition: A | Discharge status: Home / Self Care | Payer: Self-pay | Attending: Emergency Medicine | Admitting: Emergency Medicine

## 2015-08-30 DIAGNOSIS — Z87891 Personal history of nicotine dependence: Secondary | ICD-10-CM | POA: Insufficient documentation

## 2015-08-30 DIAGNOSIS — B3742 Candidal balanitis: Secondary | ICD-10-CM | POA: Insufficient documentation

## 2015-08-30 MED ORDER — FLUCONAZOLE 200 MG PO TABS: 200.0000 mg | ORAL_TABLET | Freq: Every day | ORAL | 0 refills | Status: AC

## 2015-08-30 MED ORDER — FLUCONAZOLE 100 MG PO TABS
200.0000 mg | ORAL_TABLET | Freq: Once | ORAL | Status: AC
  Administered 2015-08-30: 200 mg via ORAL
  Filled 2015-08-30: qty 2

## 2015-08-30 NOTE — ED Provider Notes (Signed)
[facility]-EMERGENCY DEPT Provider Note   CSN: [id number] Arrival date & time: 08/30/15  4098  First Provider Contact:  First MD Initiated Contact with Patient 08/30/15 0912     By signing my name below, I, Soijett Blue, attest that this documentation has been prepared under the direction and in the presence of Ebbie Ridge, [employer] Electronically Signed: Soijett Blue, ED Scribe. 08/30/15. 9:48 AM.    History   Chief Complaint No chief complaint on file.   HPI  Gabriel Ball is a 45 y.o. male with a medical hx of herpes who presents to the Emergency Department complaining of penile irritation onset 4 days. Pt states that he noticed red bumps on the head of his penis that are not painful. He states that he is having associated symptoms of red bumps to head of penis. He states that he has not tried any medications for the relief for his symptoms. He denies penile discharge and any other symptoms.   Per pt chart review: Pt was seen at his PCP office on 08/26/2015 for chronic cough and penile irritation. Pt had CXR imaging completed with negative results. Pt was Rx claritin, prilosec, dulera, proventil, and acyclovir for their symptoms. Pt was informed to follow up in 3 weeks on 09/16/2015 for further evaluation.   The history is provided by the patient. No language interpreter was used.    Past Medical History:  Diagnosis Date  . Herpes   . Shingles     Patient Active Problem List   Diagnosis Date Noted  . HSV (herpes simplex virus) infection 02/24/2015  . Anxiety 04/02/2014    History reviewed. No pertinent surgical history.     Home Medications    Prior to Admission medications   Medication Sig Start Date End Date Taking? Authorizing Provider  acyclovir (ZOVIRAX) 400 MG tablet Take 1 tablet (400 mg total) by mouth 5 (five) times daily. 08/26/15   Josalyn Funches, MD  albuterol (PROVENTIL HFA;VENTOLIN HFA) 108 (90 Base) MCG/ACT inhaler Inhale 2 puffs into the lungs every 4 (four)  hours as needed for wheezing or shortness of breath. 08/26/15   Josalyn Funches, MD  benzonatate (TESSALON) 200 MG capsule Take 1 capsule (200 mg total) by mouth 3 (three) times daily as needed for cough. 08/15/15   Domenick Gong, MD  cetirizine (ZYRTEC) 10 MG tablet Take 1 tablet (10 mg total) by mouth daily. Patient not taking: Reported on 08/26/2015 06/03/15   [name], NP  ibuprofen (ADVIL,MOTRIN) 600 MG tablet Take 1 tablet (600 mg total) by mouth every 6 (six) hours as needed. Patient not taking: Reported on 08/26/2015 11/23/14   Zadie Rhine, MD  ipratropium (ATROVENT) 0.06 % nasal spray Place 2 sprays into both nostrils 4 (four) times daily. 3-4 times/ day 08/15/15   Domenick Gong, MD  loratadine (CLARITIN) 10 MG tablet Take 1 tablet (10 mg total) by mouth daily. 08/26/15   Josalyn Funches, MD  meloxicam (MOBIC) 15 MG tablet Take 1 tablet (15 mg total) by mouth daily. For tendonitis Patient not taking: Reported on 08/26/2015 02/24/15   Ambrose Finland, NP  mometasone-formoterol (DULERA) 100-5 MCG/ACT AERO Inhale 2 puffs into the lungs 2 (two) times daily. 08/26/15   Pete Glatter, MD  omeprazole (PRILOSEC) 40 MG capsule Take 1 capsule (40 mg total) by mouth daily. 08/26/15   Pete Glatter, MD  terbinafine (LAMISIL AT) 1 % cream Apply 1 application topically 2 (two) times daily. Patient not taking: Reported on 08/26/2015 10/17/14  Felicie Mornavid Smith, NP    Family History Family History  Problem Relation Age of Onset  . Cancer Father     Social History Social History  Substance Use Topics  . Smoking status: Former Games developermoker  . Smokeless tobacco: Never Used  . Alcohol use No     Allergies   Penicillins and Amoxicillin   Review of Systems Review of Systems  A complete 10 system review of systems was obtained and all systems are negative except as noted in the HPI and PMH.   Physical Exam Updated Vital Signs BP 116/69 (BP Location: Left Arm)   Pulse 74   Temp 99.4 F (37.4 C) (Oral)    Resp 20   Wt 191 lb (86.6 kg)   SpO2 100%   BMI 31.78 kg/m   Physical Exam  Constitutional: He is oriented to person, place, and time. He appears well-developed and well-nourished. No distress.  HENT:  Head: Normocephalic and atraumatic.  Eyes: EOM are normal.  Neck: Neck supple.  Cardiovascular: Normal rate.   Pulmonary/Chest: Effort normal. No respiratory distress.  Abdominal: He exhibits no distension.  Genitourinary: Uncircumcised. No penile tenderness. No discharge found.  Genitourinary Comments: Multiple small red areas that are not raised or painful on the dorsal aspect to head of penis. No penile discharge on exam.   Musculoskeletal: Normal range of motion.  Neurological: He is alert and oriented to person, place, and time.  Skin: Skin is warm and dry.  Psychiatric: He has a normal mood and affect. His behavior is normal.  Nursing note and vitals reviewed.    ED Treatments / Results  DIAGNOSTIC STUDIES: Oxygen Saturation is 100% on RA, nl by my interpretation.    COORDINATION OF CARE: 9:47 AM Discussed treatment plan with pt at bedside and pt agreed to plan.   Labs (all labs ordered are listed, but only abnormal results are displayed) Labs Reviewed - No data to display  EKG  EKG Interpretation None       Radiology No results found.  Procedures Procedures (including critical care time)  Medications Ordered in ED Medications - No data to display   Initial Impression / Assessment and Plan / ED Course  I have reviewed the triage vital signs and the nursing notes.  Pertinent labs & imaging results that were available during my care of the patient were reviewed by me and considered in my medical decision making (see chart for details).  Clinical Course  Patient be treated for a yeast type infection based on the appearance of the rash.  I advised the patient in follow-up with his primary care Dr. told to return here as needed.  Patient agrees the plan  and all questions were answered  Final Clinical Impressions(s) / ED Diagnoses   Final diagnoses:  None    New Prescriptions New Prescriptions   No medications on file   All other systems negative except as documented in the HPI. All pertinent positives and negatives as reviewed in the HPI.    Charlestine NightChristopher Lauriel Helin, PA-C 08/30/15 1002    Benjiman CoreNathan Pickering, MD 08/30/15 1529

## 2015-08-30 NOTE — ED Notes (Signed)
Declined W/C at D/C and was escorted to lobby by RN. 

## 2015-08-30 NOTE — Discharge Instructions (Signed)
Return here as needed.  Followup with your primary care Dr. for recheck °

## 2015-08-30 NOTE — ED Triage Notes (Signed)
Patient here reporting that he used dulera and made him experience nausea. On arrival no complaints. Also requesting to have bumps on penis checked. NAD

## 2015-09-01 ENCOUNTER — Telehealth: Payer: Self-pay | Admitting: Internal Medicine

## 2015-09-01 MED ORDER — BUDESONIDE-FORMOTEROL FUMARATE 80-4.5 MCG/ACT IN AERO: 2.0000 | INHALATION_SPRAY | Freq: Two times a day (BID) | RESPIRATORY_TRACT | 3 refills | Status: DC

## 2015-09-01 NOTE — Telephone Encounter (Signed)
Will forward to pcp

## 2015-09-01 NOTE — Telephone Encounter (Signed)
Pt. Called stating that mometasone-formoterol (DULERA) 100-5 MCG/ACT AERO  Made him had a headache and he was also vomiting. Pt. Would like to know if he can get the medication changed. Please f/u with pt.

## 2015-09-01 NOTE — Telephone Encounter (Signed)
Please call him and instruct him to stop the dulera.  I put in rx for singulair, 2puffs bid, may have better response. Thanks.

## 2015-09-02 NOTE — Telephone Encounter (Signed)
Pt called back returning nurse's call, pt is aware new medication is at pharmacy and states he has stopped dulera since he wasn't felling well taking it.

## 2015-09-02 NOTE — Telephone Encounter (Signed)
[employer] Clydie BraunKaren [id number] contacted pt to make him aware that he will use the singulair 2 puffs bid

## 2015-09-02 NOTE — Telephone Encounter (Signed)
[employer] [place] Id: [phone] contacted pt to make aware of Dr. Julien NordmannLangeland answer. Pt did not answer lvm for pt to give me a call back at his earliest convenience also informed pt to stop the dulera and he has an rx of singulair at the pharmacy

## 2015-11-07 IMAGING — CT CT HEAD W/O CM
1 series · 16 of 29 positions shown, 20 images · non-contrast
Comparison: None.

CLINICAL DATA: Headache behind right eye  since yesterday

EXAM:
CT HEAD WITHOUT CONTRAST
TECHNIQUE: Contiguous axial images were obtained from the base of the skull
through the vertex without intravenous contrast.

[Series 2: head 5.0 h30s · axial · 0.43mm/px · z∈[-74,+56]mm · 16 of 29 slices shown, 20 images]
[im 2/29  brain]
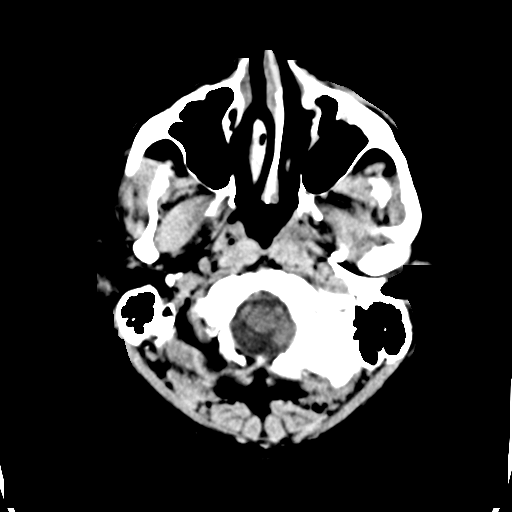
[im 2/29  bone]
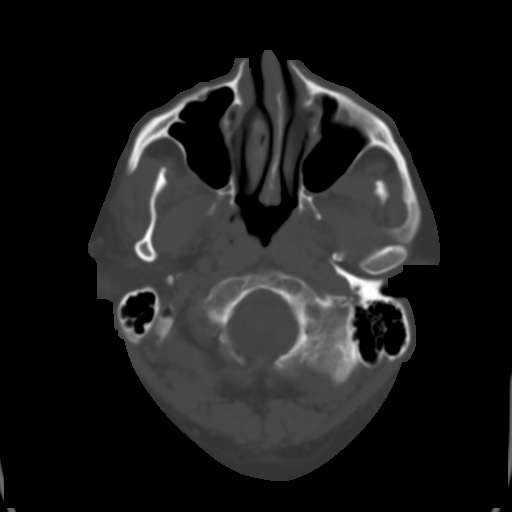
[im 4/29  brain]
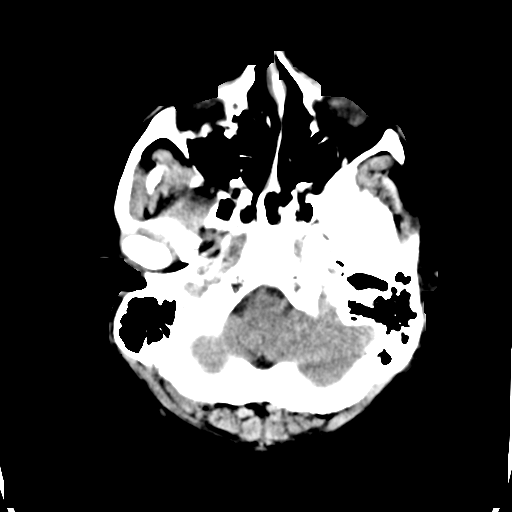
[im 6/29  brain]
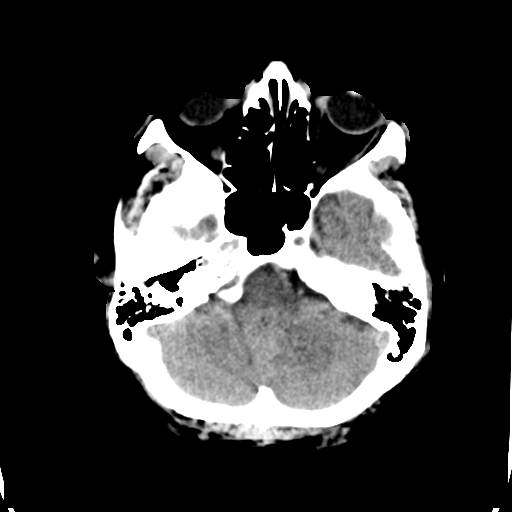
[im 7/29  brain]
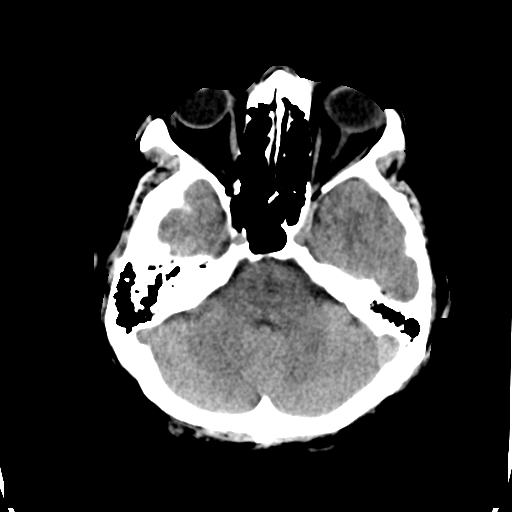
[im 9/29  brain]
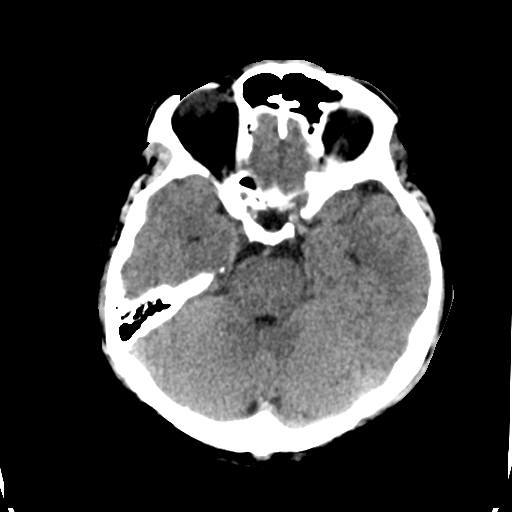
[im 9/29  bone]
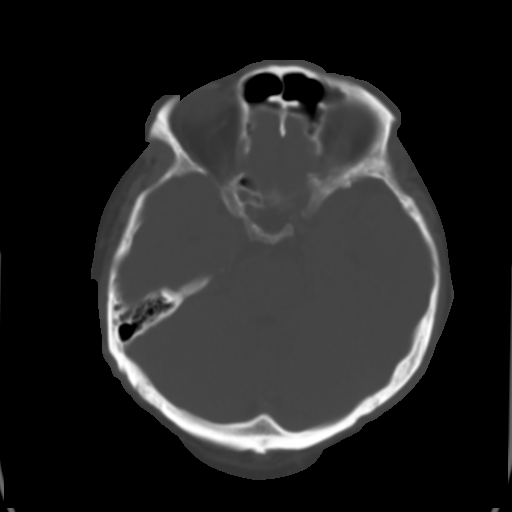
[im 11/29  brain]
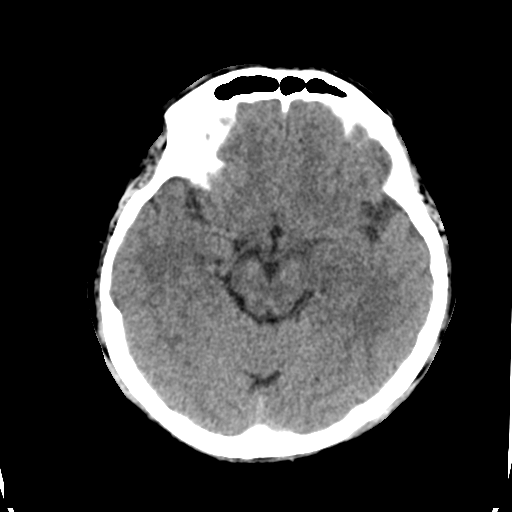
[im 12/29  brain]
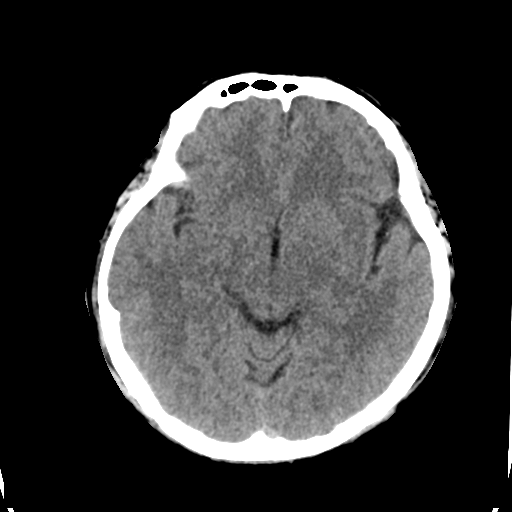
[im 14/29  brain]
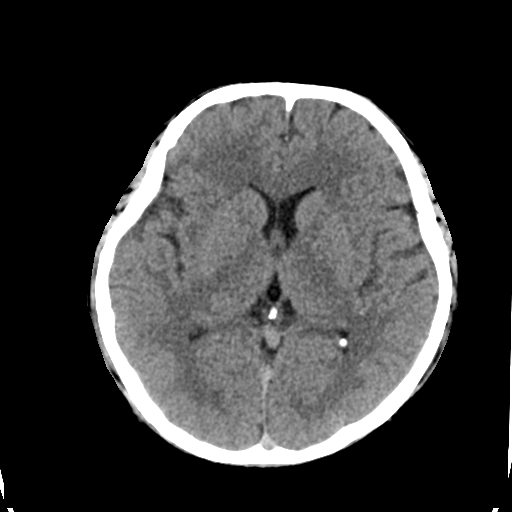
[im 16/29  brain]
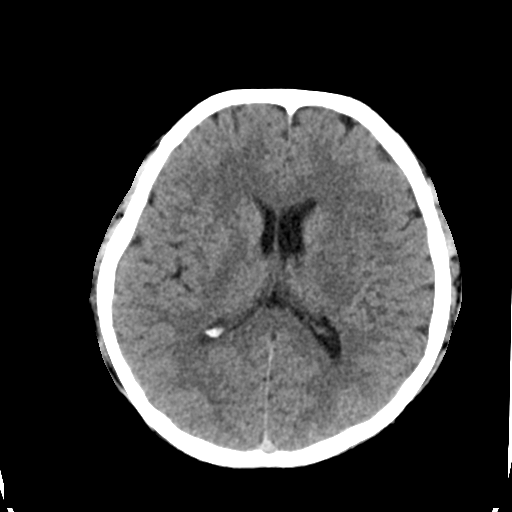
[im 16/29  bone]
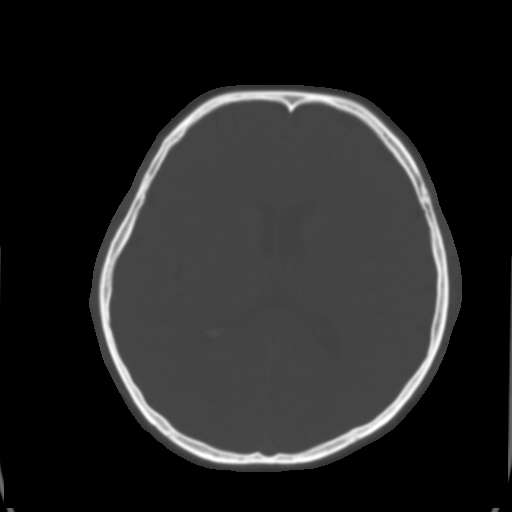
[im 18/29  brain]
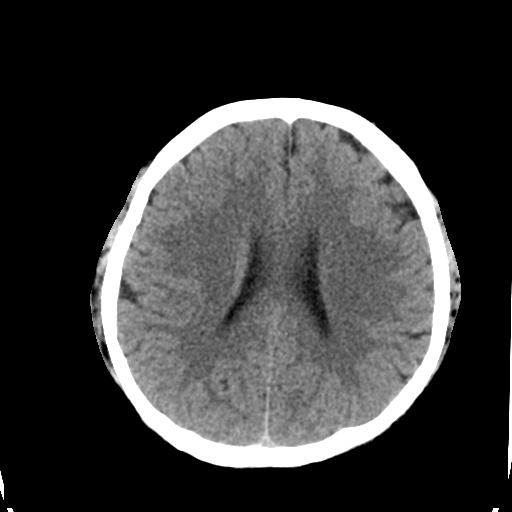
[im 19/29  brain]
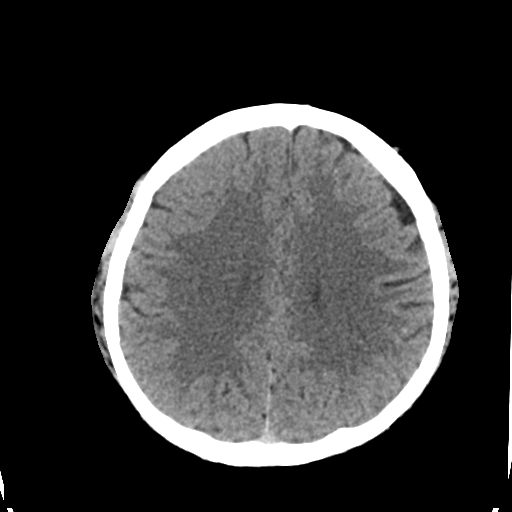
[im 21/29  brain]
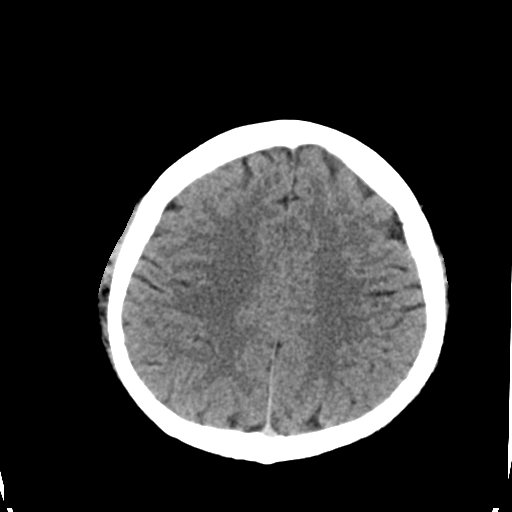
[im 23/29  brain]
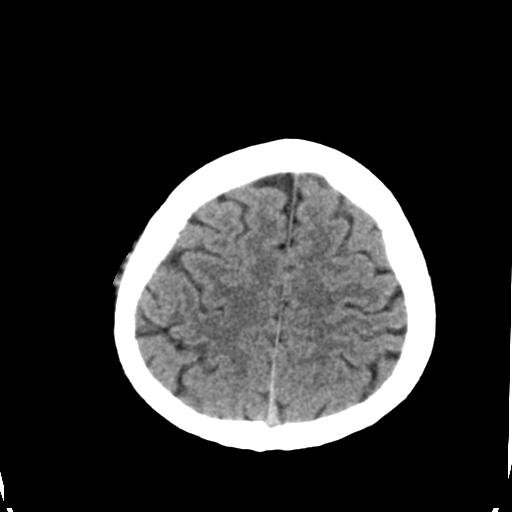
[im 23/29  bone]
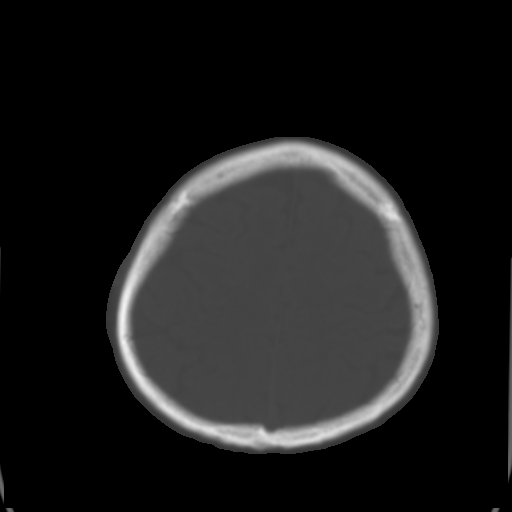
[im 24/29  brain]
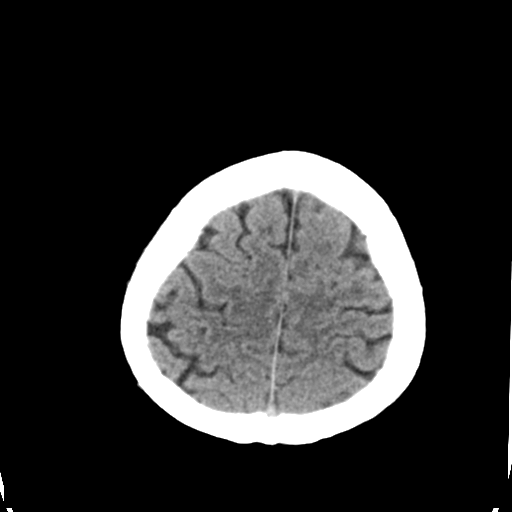
[im 26/29  brain]
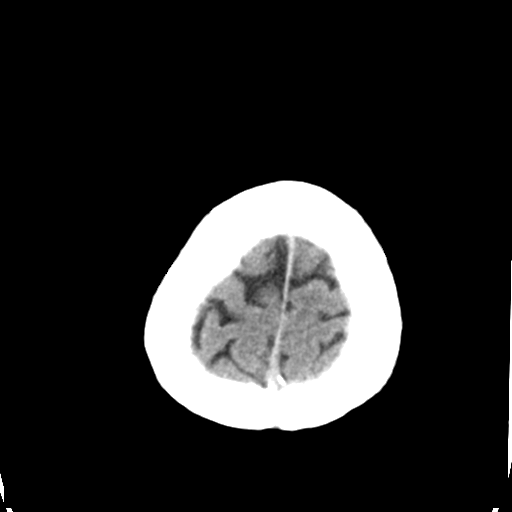
[im 28/29  brain]
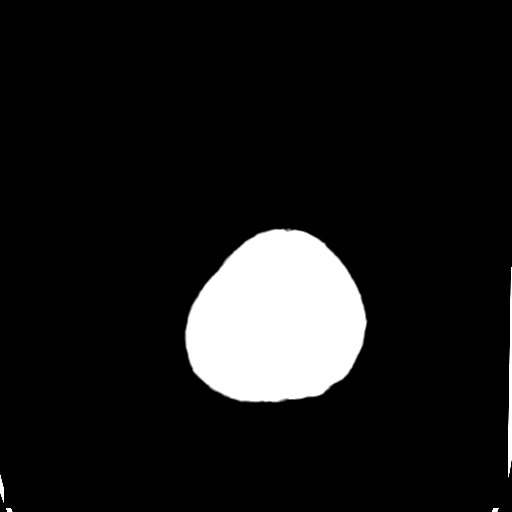

[16 of 29 positions shown; findings below may reference images not displayed]

FINDINGS: No skull fracture is noted. Paranasal sinuses and mastoid air cells
are unremarkable.

No acute cortical infarction. No hydrocephalus. No intracranial
hemorrhage, mass effect or midline shift. No intra or extra-axial
fluid collection. The gray and white-matter differentiation is
preserved. No mass lesion is noted on this unenhanced scan.
IMPRESSION: No acute intracranial abnormality.

## 2015-12-15 ENCOUNTER — Telehealth: Payer: Self-pay | Admitting: Internal Medicine

## 2015-12-15 MED ORDER — BUDESONIDE-FORMOTEROL FUMARATE 80-4.5 MCG/ACT IN AERO: 2.0000 | INHALATION_SPRAY | Freq: Two times a day (BID) | RESPIRATORY_TRACT | 3 refills | Status: DC

## 2015-12-15 NOTE — Telephone Encounter (Signed)
Patient called to request medication refill budesonide-formoterol (SYMBICORT) 80-4.5 MCG/ACT inhaler. [facility] [address]   Thank you

## 2015-12-31 ENCOUNTER — Encounter: Payer: Self-pay | Admitting: Internal Medicine

## 2015-12-31 ENCOUNTER — Ambulatory Visit: Payer: Self-pay | Attending: Internal Medicine | Admitting: Internal Medicine

## 2015-12-31 VITALS — BP 114/76 | HR 70 | Temp 98.6°F | Resp 16 | Wt 194.8 lb

## 2015-12-31 DIAGNOSIS — Z0000 Encounter for general adult medical examination without abnormal findings: Secondary | ICD-10-CM

## 2015-12-31 DIAGNOSIS — Z7951 Long term (current) use of inhaled steroids: Secondary | ICD-10-CM | POA: Insufficient documentation

## 2015-12-31 DIAGNOSIS — Z880 Allergy status to penicillin: Secondary | ICD-10-CM | POA: Insufficient documentation

## 2015-12-31 DIAGNOSIS — Z23 Encounter for immunization: Secondary | ICD-10-CM | POA: Insufficient documentation

## 2015-12-31 DIAGNOSIS — J9801 Acute bronchospasm: Secondary | ICD-10-CM

## 2015-12-31 DIAGNOSIS — J45909 Unspecified asthma, uncomplicated: Secondary | ICD-10-CM | POA: Insufficient documentation

## 2015-12-31 DIAGNOSIS — Z79899 Other long term (current) drug therapy: Secondary | ICD-10-CM | POA: Insufficient documentation

## 2015-12-31 LAB — CBC WITH DIFFERENTIAL/PLATELET
BASOS ABS: 0 {cells}/uL (ref 0–200)
BASOS PCT: 0 %
EOS PCT: 3 %
Eosinophils Absolute: 216 cells/uL (ref 15–500)
HCT: 47.9 % (ref 38.5–50.0)
HEMOGLOBIN: 16.2 g/dL (ref 13.2–17.1)
LYMPHS ABS: 2376 {cells}/uL (ref 850–3900)
Lymphocytes Relative: 33 %
MCH: 29.1 pg (ref 27.0–33.0)
MCHC: 33.8 g/dL (ref 32.0–36.0)
MCV: 86.2 fL (ref 80.0–100.0)
MONOS PCT: 6 %
MPV: 11.3 fL (ref 7.5–12.5)
Monocytes Absolute: 432 cells/uL (ref 200–950)
NEUTROS ABS: 4176 {cells}/uL (ref 1500–7800)
Neutrophils Relative %: 58 %
PLATELETS: 239 10*3/uL (ref 140–400)
RBC: 5.56 MIL/uL (ref 4.20–5.80)
RDW: 13.8 % (ref 11.0–15.0)
WBC: 7.2 10*3/uL (ref 3.8–10.8)

## 2015-12-31 LAB — CMP AND LIVER
ALBUMIN: 4.7 g/dL (ref 3.6–5.1)
ALK PHOS: 74 U/L (ref 40–115)
ALT: 20 U/L (ref 9–46)
AST: 21 U/L (ref 10–40)
BILIRUBIN INDIRECT: 0.5 mg/dL (ref 0.2–1.2)
BILIRUBIN TOTAL: 0.6 mg/dL (ref 0.2–1.2)
BUN: 10 mg/dL (ref 7–25)
Bilirubin, Direct: 0.1 mg/dL (ref <=0.2)
CO2: 27 mmol/L (ref 20–31)
Calcium: 9.3 mg/dL (ref 8.6–10.3)
Chloride: 106 mmol/L (ref 98–110)
Creat: 0.79 mg/dL (ref 0.60–1.35)
GLUCOSE: 91 mg/dL (ref 65–99)
Potassium: 4.1 mmol/L (ref 3.5–5.3)
SODIUM: 140 mmol/L (ref 135–146)
TOTAL PROTEIN: 7.4 g/dL (ref 6.1–8.1)

## 2015-12-31 LAB — TSH: TSH: 2.11 m[IU]/L (ref 0.40–4.50)

## 2015-12-31 LAB — POCT GLYCOSYLATED HEMOGLOBIN (HGB A1C): HEMOGLOBIN A1C: 5.1

## 2015-12-31 LAB — PSA: PSA: 0.9 ng/mL (ref <=4.0)

## 2015-12-31 MED ORDER — ALBUTEROL SULFATE HFA 108 (90 BASE) MCG/ACT IN AERS: 2.0000 | INHALATION_SPRAY | RESPIRATORY_TRACT | 2 refills | Status: DC | PRN

## 2015-12-31 MED ORDER — BUDESONIDE-FORMOTEROL FUMARATE 80-4.5 MCG/ACT IN AERO: 2.0000 | INHALATION_SPRAY | Freq: Two times a day (BID) | RESPIRATORY_TRACT | 3 refills | Status: DC

## 2015-12-31 NOTE — Progress Notes (Signed)
Gabriel Ball, is a 45 y.o. male  WUJ:[id number]  NFA:[id number]  DOB - 1970-02-05  Chief Complaint  Patient presents with  . Asthma        Subjective:   Gabriel Ball is a 45 y.o. male here today for a follow up visit.  Last seen 8/8 for bronchospasms/cough. Per pt, since than he has moved to new apt, less humid which he thinks contributed to his sxs. He has also been using symbicort bid and has not had to use albuterol rescue for at least 3 months now.  Denies tob/etoh/drugs.  Patient has No headache, No chest pain, No abdominal pain - No Nausea, No new weakness tingling or numbness, No Cough - SOB.  Not fasting.  Interpreter was used to communicate directly with patient for the entire encounter including providing detailed patient instructions.   No problems updated.  ALLERGIES: Allergies  Allergen Reactions  . Penicillins   . Amoxicillin Itching    Pruritic rash    PAST MEDICAL HISTORY: Past Medical History:  Diagnosis Date  . Herpes   . Shingles     MEDICATIONS AT HOME: Prior to Admission medications   Medication Sig Start Date End Date Taking? Authorizing Provider  albuterol (PROVENTIL HFA;VENTOLIN HFA) 108 (90 Base) MCG/ACT inhaler Inhale 2 puffs into the lungs every 4 (four) hours as needed for wheezing or shortness of breath. 12/31/15  Yes Pete Glatterawn T Jovie Swanner, MD  budesonide-formoterol (SYMBICORT) 80-4.5 MCG/ACT inhaler Inhale 2 puffs into the lungs 2 (two) times daily. Try to taper to off if able.  If worse breathing worsens, than resume bid again. 12/31/15  Yes Pete Glatterawn T Lorel Lembo, MD  loratadine (CLARITIN) 10 MG tablet Take 1 tablet (10 mg total) by mouth daily. 08/26/15  Yes Josalyn Funches, MD  omeprazole (PRILOSEC) 40 MG capsule Take 1 capsule (40 mg total) by mouth daily. 08/26/15  Yes Pete Glatterawn T Ahnyla Mendel, MD  acyclovir (ZOVIRAX) 400 MG tablet Take 1 tablet (400 mg total) by mouth 5 (five) times daily. Patient not taking: Reported on 12/31/2015 08/26/15   Dessa PhiJosalyn  Funches, MD  ipratropium (ATROVENT) 0.06 % nasal spray Place 2 sprays into both nostrils 4 (four) times daily. 3-4 times/ day 08/15/15   Domenick GongAshley Mortenson, MD  terbinafine (LAMISIL AT) 1 % cream Apply 1 application topically 2 (two) times daily. Patient not taking: Reported on 08/26/2015 10/17/14   Felicie Mornavid Smith, NP     Objective:   Vitals:   12/31/15 0944  BP: 114/76  Pulse: 70  Resp: 16  Temp: 98.6 F (37 C)  TempSrc: Oral  SpO2: 97%  Weight: 194 lb 12.8 oz (88.4 kg)    Exam General appearance : Awake, alert, not in any distress. Speech Clear. Not toxic looking, pleasant. HEENT: Atraumatic and Normocephalic, pupils equally reactive to light. Neck: supple, no JVD.  Chest:Good air entry bilaterally, no added sounds. CVS: S1 S2 regular, no murmurs/gallups or rubs. Abdomen: Bowel sounds active, Non tender and not distended with no gaurding, rigidity or rebound. Extremities: B/L Lower Ext shows no edema, both legs are warm to touch Neurology: Awake alert, and oriented X 3, CN II-XII grossly intact, Non focal Skin:No Rash  Data Review Lab Results  Component Value Date   HGBA1C 5.10 10/31/2014    [facility] 12/31/2015 08/26/2015 08/13/2014 11/19/2013  Decreased Interest 0 0 3 0  Down, Depressed, Hopeless 0 0 0 0  PHQ - 2 Score 0 0 3 0  Altered sleeping - - 3 -  Tired,  decreased energy - - 2 -  Change in appetite - - 0 -  Feeling bad or failure about yourself  - - 0 -  Trouble concentrating - - 3 -  Moving slowly or fidgety/restless - - 0 -  Suicidal thoughts - - 0 -  PHQ-9 Score - - 11 -      Assessment & Plan   1. Bronchospasm, resolved Advised to transition symbicort to daily x 1 wk, than to stop if resp status remains benign. If he notes that he is using his Albuterol MDI more than 2-3 times x day, than need to restart symbicort bid.   2. Healthcare maintenance - TSH - PSA - CBC with Differential - CMP and Liver - VITAMIN D 25 Hydroxy (Vit-D  Deficiency, Fractures) - HIV antibody (with reflex) - HgB A1c     Patient have been counseled extensively about nutrition and exercise  Return in about 6 months (around 06/30/2016), or if symptoms worsen or fail to improve.  The patient was given clear instructions to go to ER or return to medical center if symptoms don't improve, worsen or new problems develop. The patient verbalized understanding. The patient was told to call to get lab results if they haven't heard anything in the next week.   This note has been created with Education officer, environmentalDragon speech recognition software and smart phrase technology. Any transcriptional errors are unintentional.   Pete Glatterawn T Rozell Kettlewell, MD, MBA/MHA [facility] [place], [place] [phone]   12/31/2015, 10:20 AM

## 2015-12-31 NOTE — Patient Instructions (Addendum)
Mantenimiento de la salud en los hombres (Health Maintenance, Male) Un estilo de vida saludable y los cuidados preventivos son importantes para la salud y el bienestar. Pregntele al mdico cul es el cronograma de exmenes peridicos adecuado para usted. QU DEBO SABER SOBRE EL PESO Y LA DIETA? Consuma una dieta saludable  Coma muchas verduras, frutas, cereales integrales, productos lcteos con bajo contenido de grasa y protenas magras.  No consuma muchos alimentos de alto contenido de grasas slidas, azcares agregados o sal. Mantenga un peso saludable La actividad fsica habitual puede ayudarlo a alcanzar o mantener un peso saludable. Deber hacer lo siguiente:  Realizar al menos 150minutos de actividad fsica por semana. El ejercicio debe aumentar la frecuencia cardaca y provocar la transpiracin (ejercicio de intensidad moderada).  Hacer ejercicios de entrenamiento de fuerza por lo menos dos veces por semana. Controlarse los niveles de colesterol y lpidos en la sangre  Hgase anlisis de sangre para controlar los lpidos y el colesterol cada 5aos a partir de los 35aos. Si tiene un riesgo alto de tener cardiopatas coronarias, debe comenzar a hacerse anlisis de sangre a los 20aos. Es posible que necesite controlar los niveles de colesterol con mayor frecuencia si:  Sus niveles de lpidos y colesterol son altos.  Es mayor de 50aos.  Tiene un riesgo alto de tener cardiopatas coronarias. QU DEBO SABER SOBRE LAS PRUEBAS DE DETECCIN DEL CNCER? Muchos tipos de cncer se pueden detectar de manera temprana y a menudo prevenirse. Cncer de pulmn  Debe someterse a pruebas de deteccin de cncer de pulmn todos los aos en los siguientes casos:  Si fuma actualmente y lo ha hecho durante por lo menos 30aos.  Si fue fumador que dej el hbito en el trmino de los ltimos 15aos.  Hable con el mdico sobre las opciones en relacin con los estudios de deteccin, cundo  debe comenzar a hacrselos y con qu frecuencia. Cncer colorrectal  Generalmente, las pruebas de deteccin habituales del cncer colorrectal comienzan a los 50aos y deben repetirse cada 5 a 10aos hasta los 75aos. Es posible que tenga que hacerse las pruebas con mayor frecuencia si se detectan formas tempranas de plipos precancerosos o pequeos bultos. Sin embargo, el mdico podr aconsejarle que lo haga antes, si tiene factores de riesgo para el cncer de colon.  El mdico puede recomendarle que use kits de prueba caseros para hallar sangre oculta en la materia fecal.  Se puede usar una pequea cmara en el extremo de un tubo para examinar el colon (sigmoidoscopia o colonoscopia). Este estudio detecta las formas ms tempranas de cncer colorrectal. Cncer de prstata y de testculo  En funcin de la edad y del estado de salud general, el mdico puede realizarle determinados estudios de deteccin del cncer de prstata y de testculo.  Hable con el mdico sobre cualquier sntoma o acerca de las inquietudes que tenga sobre el cncer de prstata o de testculo. Cncer de piel  Revise la piel de la cabeza a los pies con regularidad.  Informe al mdico si aparecen nuevos lunares o si nota cambios en los que ya tiene, especialmente en estos casos:  Si hay un cambio en el tamao, la forma o el color del lunar.  Si tiene un lunar que es ms grande que el tamao de una goma de lpiz.  Siempre use pantalla solar. Aplquese pantalla solar de manera generosa y repetida a lo largo del da.  Use mangas y pantalones largos, un sombrero de ala ancha y gafas para   el sol cuando est al aire libre, para protegerse. QU DEBO SABER SOBRE LAS CARDIOPATAS CORONARIAS, LA DIABETES Y LA HIPERTENSIN ARTERIAL?  Si usted tiene entre 18 y 39aos, debe medirse la presin arterial cada 3a 5aos. Si usted tiene 40aos o ms, debe medirse la presin arterial todos los aos. Debe medirse la presin arterial  dos veces: una vez cuando est en un hospital o una clnica y la otra vez cuando est en otro sitio. Registre el promedio de las dos mediciones. Para controlar su presin arterial cuando no est en un hospital o una clnica, puede usar lo siguiente:  Una mquina automtica para medir la presin arterial en una farmacia.  Un monitor para medir la presin arterial en el hogar.  Hable con el mdico sobre los valores ideales de la presin arterial.  Si tiene entre 45 y 79aos, consltele al mdico si debe tomar aspirina para evitar las cardiopatas coronarias.  Hgase anlisis habituales de deteccin de la diabetes; para ello, contrlese la glucemia en ayunas.  Si su peso es normal y tiene un bajo riesgo de padecer diabetes, realcese este anlisis cada tres aos despus de los 45aos.  Si tiene sobrepeso y un alto riesgo de padecer diabetes, considere someterse a este anlisis antes o con mayor frecuencia.  Para los hombres que tienen entre 65 y 75aos, y son o han sido fumadores, se recomienda un nico estudio con ecografa para detectar un aneurisma de aorta abdominal (AAA). QU DEBO SABER SOBRE LA PREVENCIN DE LAS INFECCIONES? HepatitisB Si tiene un riesgo ms alto de contraer hepatitis B, debe someterse a un examen de deteccin de este virus. Hable con el mdico para determinar si corre riesgo de tener una infeccin por hepatitisB. Hepatitis C Se recomienda un anlisis de sangre para:  Todos los que nacieron entre 1945 y 1965.  Todas las personas que tengan un riesgo de haber contrado hepatitis C. Enfermedades de transmisin sexual (ETS)  Debe realizarse pruebas de deteccin de las ETS todos los aos, incluidas la gonorrea y la clamidia, en estos casos:  Es sexualmente activo y es menor de 24aos.  Es mayor de 24aos, y el mdico le informa que corre riesgo de tener este tipo de infecciones.  La actividad sexual ha cambiado desde que le hicieron la ltima prueba de  deteccin y tiene un riesgo mayor de tener clamidia o gonorrea. Pregntele al mdico si usted tiene riesgo.  Consulte a su mdico para saber si tiene un alto riesgo de infectarse por el VIH. El mdico puede recomendarle un medicamento de venta con receta para ayudar a evitar la infeccin por el VIH. QU MS PUEDO HACER?  Realcese los estudios de rutina de la salud, dentales y de la vista.  Mantngase al da con las vacunas (inmunizaciones).  No consuma ningn producto que contenga tabaco, lo que incluye cigarrillos, tabaco de mascar y cigarrillos electrnicos. Si necesita ayuda para dejar de fumar, consulte al mdico.  Limite el consumo de alcohol a no ms de 2medidas por da. Una medida equivale a 12 onzas de cerveza, 5onzas de vino o 1onzas de bebidas alcohlicas de alta graduacin.  No consuma drogas.  No comparta agujas.  Solicite ayuda a su mdico si necesita apoyo o informacin para abandonar las drogas.  Informe a su mdico si a menudo se siente deprimido.  Notifique a su mdico si alguna vez ha sido vctima de abuso o si no se siente seguro en su hogar. Esta informacin no tiene como fin reemplazar el   en su hogar. Esta informacin no tiene Theme park manager el consejo del mdico. Asegrese de hacerle al mdico cualquier pregunta que tenga. Document Released: 07/03/2007 Document Revised: 01/25/2014 Document Reviewed: 10/08/2014 Elsevier Interactive Patient Education  2017 Elsevier Inc. Influenza Virus Vaccine injection (Fluarix) Qu es este medicamento? La VACUNA ANTIGRIPAL ayuda a disminuir el riesgo de contraer la influenza, tambin conocida como la gripe. La vacuna solo ayuda a protegerle contra algunas cepas de influenza. Esta vacuna no ayuda a reducir Nurse, adult de contraer influenza pandmica H1N1. Este medicamento puede ser utilizado para otros usos; si tiene alguna pregunta consulte con su proveedor de atencin mdica o con su farmacutico. MARCAS COMUNES: Fluarix, Fluzone Qu le debo informar a mi profesional de  la salud antes de tomar este medicamento? Necesita saber si usted presenta alguno de los siguientes problemas o situaciones: -trastorno de sangrado como hemofilia -fiebre o infeccin -sndrome de Guillain-Barre u otros problemas neurolgicos -problemas del sistema inmunolgico -infeccin por el virus de la inmunodeficiencia humana (VIH) o SIDA -niveles bajos de plaquetas en la sangre -esclerosis mltiple -una Automotive engineer o inusual a las vacunas antigripales, a los huevos, protenas de pollo, al ltex, a la gentamicina, a otros medicamentos, alimentos, colorantes o conservantes -si est embarazada o buscando quedar embarazada -si est amamantando a un beb Cmo debo utilizar este medicamento? Esta vacuna se administra mediante inyeccin por va intramuscular. Lo administra un profesional de [employer]. Recibir una copia de informacin escrita sobre la vacuna antes de cada vacuna. Asegrese de leer este folleto cada vez cuidadosamente. Este folleto puede cambiar con frecuencia. Hable con su pediatra para informarse acerca del uso de este medicamento en nios. Puede requerir atencin especial. Sobredosis: Pngase en contacto inmediatamente con un centro toxicolgico o una sala de urgencia si usted cree que haya tomado demasiado medicamento. ATENCIN: [employer] es solo para usted. No comparta este medicamento con nadie. Qu sucede si me olvido de una dosis? No se aplica en este caso. Qu puede interactuar con este medicamento? -quimioterapia o radioterapia -medicamentos que suprimen el sistema inmunolgico, tales como etanercept, anakinra, infliximab y adalimumab -medicamentos que tratan o previenen cogulos sanguneos, como warfarina -fenitona -medicamentos esteroideos, como la prednisona o la cortisona -teofilina -vacunas Puede ser que esta lista no menciona todas las posibles interacciones. Informe a su profesional de [employer] de [employer] productos a base de hierbas,  medicamentos de [place] o suplementos nutritivos que est tomando. Si usted fuma, consume bebidas alcohlicas o si utiliza drogas ilegales, indqueselo tambin a su profesional de [employer]. Algunas sustancias pueden interactuar con su medicamento. A qu debo estar atento al usar [employer]? Informe a su mdico o a Producer, television/film/video de la [employer] todos los efectos secundarios que persistan despus de [address]. Llame a su proveedor de atencin mdica si se presentan sntomas inusuales dentro de las 6 semanas posteriores a la vacunacin. Es posible que todava pueda contraer la gripe, pero la enfermedad no ser tan fuerte como normalmente. No puede contraer la gripe de esta vacuna. La vacuna antigripal no le protege contra resfros u otras enfermedades que pueden causar [place]. Debe vacunarse cada ao. Qu efectos secundarios puedo tener al [employer] este medicamento? Efectos secundarios que debe informar a su mdico o a Producer, television/film/video de la salud tan pronto como sea posible: -reacciones alrgicas como erupcin cutnea, picazn o urticarias, hinchazn de la cara, labios o lengua Efectos secundarios que, por lo general, no requieren atencin mdica (debe informarlos a su mdico o  a su profesional de la salud si persisten o si son molestos): -fiebre -dolor de cabeza -molestias y dolores musculares -dolor, sensibilidad, enrojecimiento o Paramedic de la inyeccin -cansancio o debilidad Puede ser que esta lista no menciona todos los posibles efectos secundarios. Comunquese a su mdico por asesoramiento mdico [employer]. Usted puede informar los efectos secundarios a la FDA por telfono al 1-800-FDA-1088. Dnde debo guardar mi medicina? Esta vacuna se administra solamente en clnicas, farmacias, consultorio mdico u otro consultorio de un profesional de la salud y no Teacher, early years/pre en su domicilio. ATENCIN: Este folleto es un resumen. Puede ser que no cubra  toda la posible informacin. Si usted tiene preguntas acerca de esta medicina, consulte con su mdico, su farmacutico o su profesional de Radiographer, therapeutic.  2017 Elsevier/Gold Standard (2009-07-08 15:31:40) Madilyn Fireman Td (contra la difteria y el ttanos): Lo que debe saber (Td Vaccine [Tetanus and Diphtheria]: What You Need to Know) 1. Por qu vacunarse? El ttanos y la difteria son enfermedades muy graves. Son Academic librarian frecuentes en los [place] actualmente, pero las personas que se infectan suelen tener complicaciones graves. La vacuna Td se [place] para proteger a los adolescentes y a los adultos de ambas enfermedades. Tanto el ttanos como la difteria son infecciones causadas por bacterias. La difteria se transmite de persona a persona a travs de la tos o el estornudo. La bacteria que causa el ttanos entra al cuerpo a travs de cortes, raspones o heridas. El TTANOS (trismo) provoca entumecimiento y Engineer, materials dolorosa de los msculos, por lo general, en todo el cuerpo.  Puede causar el endurecimiento de los msculos de la cabeza y el cuello, de modo que impide abrir la boca, tragar y en algunos casos, Industrial/product designer. El ttanos es causa de muerte en aproximadamente 1de cada 10personas que contraen la infeccin, incluso despus de que reciben la mejor atencin mdica. La DIFTERIA puede hacer que se forme una membrana gruesa en la parte posterior de la garganta.  Puede causar problemas respiratorios, parlisis, insuficiencia cardaca e incluso la muerte. Antes de las vacunas, en los [place] se informaban 200000 casos de difteria y cientos de casos de ttanos cada ao. Desde que comenz la vacunacin, los informes de casos de ambas enfermedades se han reducido en un 99%. 2. Madilyn Fireman Td La vacuna Td protege a adolescentes y adultos contra el ttanos y la difteria. La vacuna Td habitualmente se aplica como dosis de refuerzo cada 10aos, pero tambin puede administrarse antes si la persona sufre una  [place] o herida sucia y grave. A veces, en lugar de la vacuna Td, se recomienda una vacuna llamada Tdap, que protege contra la tosferina, adems de proteger contra el ttanos y la difteria. El mdico o la persona que le aplique la vacuna puede darle ms informacin al [employer]. La Td puede administrarse de manera segura simultneamente con otras vacunas. 3. Algunas personas no deben recibir la vacuna  Una persona que alguna vez ha tenido una reaccin alrgica potencialmente mortal a una dosis anterior de cualquier vacuna contra el ttanos o la difteria, O que tenga una alergia grave a cualquier parte de esta vacuna, no debe recibir la vacuna Td. Informe a la persona que le aplica la vacuna si usted tiene cualquier alergia grave.  Consulte con su mdico si:  tuvo hinchazn o dolor intenso despus de recibir cualquier vacuna contra la difteria o el ttanos,  alguna vez ha sufrido el sndrome de [place],  no se siente bien el  da en que se ha programado la vacuna. 4. Riesgos de [place] reaccin a la vacuna Con cualquier medicamento, incluyendo las vacunas, existe la posibilidad de que aparezcan efectos secundarios. Suelen ser leves y desaparecen por s solos. Tambin son posibles las reacciones graves, pero en raras ocasiones. La Harley-Davidsonmayora de las personas a las que se les aplica la vacuna Td no tienen ningn problema. Problemas leves despus de la vacuna Td: (No interfirieron en otras actividades)  Dolor en el lugar donde se aplic la vacuna (alrededor de 8de cada 10personas)  Enrojecimiento o hinchazn en el lugar donde se aplic la vacuna (alrededor de 1de cada 4personas)  Fiebre leve (poco frecuente)  Dolor de Training and development officercabeza (alrededor de 1de cada 4personas)  Cansancio (alrededor de 1de cada 4personas) Problemas moderados despus de la vacuna Td: (Interfirieron en otras actividades, pero no requirieron atencin mdica)  Fiebre superior a 102F (38,8C) (poco  frecuente) Problemas graves despus de la vacuna Td: (Impidieron Education officer, environmentalrealizar las actividades habituales; requirieron atencin mdica)  Buyer, retailHinchazn, dolor intenso, sangrado o enrojecimiento en el brazo en que se aplic la vacuna (poco frecuente). Problemas que podran ocurrir despus de cualquier vacuna:   Las personas a veces se desmayan despus de un procedimiento mdico, incluida la vacunacin. Si permanece sentado o recostado durante 15 minutos puede ayudar a Lubrizol Corporationevitar los desmayos y las lesiones causadas por las cadas. Informe al mdico si se siente mareado, tiene cambios en la visin o zumbidos en los odos.  Algunas personas sienten un dolor intenso en el hombro y tienen dificultad para mover el brazo donde se coloc la vacuna. Esto sucede con muy poca frecuencia.  Cualquier medicamento puede causar una reaccin alrgica grave. Dichas reacciones son Lynnae Sandhoffmuy poco frecuentes con una vacuna (se calcula que menos de 1en un milln de dosis) y se producen de unos minutos a unas horas despus de Arts development officerla administracin. Al igual que con cualquier Automatic Datamedicamento, existe una probabilidad muy remota de que una vacuna cause una lesin grave o la [place] Plainsmuerte. Se controla permanentemente la seguridad de las vacunas. Para obtener ms informacin, visite: http://floyd.org/www.cdc.gov/vaccinesafety/. 5. Qu pasa si hay una reaccin grave? A qu signos debo estar atento?   Observe todo lo que le preocupe, como signos de una reaccin alrgica grave, fiebre muy alta o comportamiento fuera de lo normal. Los signos de una reaccin alrgica grave pueden incluir ronchas, hinchazn de la cara y la garganta, dificultad para respirar, latidos cardacos acelerados, mareos y debilidad. Generalmente, estos comenzaran entre unos pocos minutos y algunas horas despus de la vacunacin. Qu debo hacer?   Si usted piensa que se trata de una reaccin alrgica grave o de otra emergencia que no puede esperar, llame al 911 o dirjase al hospital ms cercano.  Sino, llame a su mdico.  Despus, la reaccin debe informarse al [address] Informacin sobre Efectos Adversos de las [place] Gulf Coast UniversityVacunas (Vaccine Adverse Event Reporting System, VAERS). Su mdico puede presentar este informe, o puede hacerlo usted mismo a travs del sitio web de VAERS, en www.vaers.LAgents.nohhs.gov, o llamando al [phone]. VAERS no brinda recomendaciones mdicas.  6. SunTrustPrograma Nacional de Compensacin de Daos por American Electric PowerVacunas El ShawnachesterPrograma Nacional de Compensacin de Daos por Administrator, artsVacunas (National Vaccine Injury Compensation Program, VICP) es un programa federal que fue creado para Patent examinercompensar a las personas que puedan haber sufrido daos al recibir ciertas vacunas. Aquellas personas que consideren que han sufrido un dao como consecuencia de una vacuna y [place]quieran saber ms acerca del programa y de cmo presentar Roslynn Ambleuna denuncia, pueden llamar al  [phone] o visitar su sitio web en SpiritualWord.atwww.hrsa.gov/vaccinecompensation. Hay un lmite de tiempo para presentar un reclamo de compensacin. 7. Cmo puedo obtener ms informacin?  Consulte a su mdico. Este puede darle el prospecto de la vacuna o recomendarle otras fuentes de informacin.  Comunquese con el servicio de salud de su localidad o [address] estado.  Comunquese con los Centros para Air traffic controllerel Control y la Prevencin de Child psychotherapistnfermedades (Centers for Disease Control and Prevention , CDC).  Llame al [phone] (1-800-CDC-INFO).  Visite el sitio Environmental managerweb de los CDC en PicCapture.uywww.cdc.gov/vaccines. Declaracin de informacin sobre la vacuna contra la difteria y el ttanos (Td) de los CDC (04/29/15) Esta informacin no tiene Theme park managercomo fin reemplazar el consejo del mdico. Asegrese de hacerle al mdico cualquier pregunta que tenga. Document Released: 04/22/2008 Document Revised: 01/25/2014 Document Reviewed: 04/29/2015 Elsevier Interactive Patient Education  2017 ArvinMeritorElsevier Inc.

## 2015-12-31 NOTE — Progress Notes (Signed)
45

## 2016-01-01 LAB — VITAMIN D 25 HYDROXY (VIT D DEFICIENCY, FRACTURES): Vit D, 25-Hydroxy: 14 ng/mL — ABNORMAL LOW (ref 30–100)

## 2016-01-01 LAB — HIV ANTIBODY (ROUTINE TESTING W REFLEX): HIV 1&2 Ab, 4th Generation: NONREACTIVE

## 2016-01-03 ENCOUNTER — Other Ambulatory Visit: Payer: Self-pay | Admitting: Internal Medicine

## 2016-01-03 MED ORDER — VITAMIN D (ERGOCALCIFEROL) 1.25 MG (50000 UNIT) PO CAPS: 50000.0000 [IU] | ORAL_CAPSULE | ORAL | 0 refills | Status: DC

## 2016-01-09 ENCOUNTER — Telehealth: Payer: Self-pay

## 2016-01-09 NOTE — Telephone Encounter (Signed)
[employer] Jesus Id: [id number] contacted patient to go over lab results pt didn't answer lvm asking pt to give me a call back at his earliest convenience

## 2016-01-14 ENCOUNTER — Telehealth: Payer: Self-pay | Admitting: Internal Medicine

## 2016-01-14 NOTE — Telephone Encounter (Signed)
Patient needs lab results. °Please follow up. ° °

## 2016-03-18 ENCOUNTER — Ambulatory Visit (HOSPITAL_COMMUNITY)
Admission: EM | Admit: 2016-03-18 | Discharge: 2016-03-18 | Disposition: A | Discharge status: Home / Self Care | Payer: Self-pay | Attending: Internal Medicine | Admitting: Internal Medicine

## 2016-03-18 ENCOUNTER — Encounter (HOSPITAL_COMMUNITY): Payer: Self-pay | Admitting: Emergency Medicine

## 2016-03-18 DIAGNOSIS — J0100 Acute maxillary sinusitis, unspecified: Secondary | ICD-10-CM

## 2016-03-18 DIAGNOSIS — J40 Bronchitis, not specified as acute or chronic: Secondary | ICD-10-CM

## 2016-03-18 MED ORDER — BENZONATATE 100 MG PO CAPS: 100.0000 mg | ORAL_CAPSULE | Freq: Three times a day (TID) | ORAL | 0 refills | Status: DC | PRN

## 2016-03-18 MED ORDER — DOXYCYCLINE HYCLATE 100 MG PO CAPS: 100.0000 mg | ORAL_CAPSULE | Freq: Two times a day (BID) | ORAL | 0 refills | Status: AC

## 2016-03-18 MED ORDER — DOXYCYCLINE HYCLATE 100 MG PO CAPS: 100.0000 mg | ORAL_CAPSULE | Freq: Two times a day (BID) | ORAL | 0 refills | Status: DC

## 2016-03-18 NOTE — Discharge Instructions (Signed)
Recommend start Doxycycline 100mg  twice a day as directed. Take Tessalon cough pills 1 every 8 hours as needed. Continue to use Albuterol inhaler 2 inhalations every 6 hours as needed. Rest. Drink plenty of water. Follow-up with a primary care provider in 4 to 5 days if not improving.

## 2016-03-18 NOTE — ED Triage Notes (Signed)
Pt c/o cold sx onset: 4 days  Sx include: prod cough, diaphoresis, back pain, chest pain, fever that began yest  Temp today is 100.0  Taking: OTC cold meds w/temp relief  A&O x4... NAD

## 2016-03-18 NOTE — ED Provider Notes (Signed)
CSN: [id number]     Arrival date & time 03/18/16  1812 History   First MD Initiated Contact with Patient 03/18/16 1846     Chief Complaint  Patient presents with  . URI   (Consider location/radiation/quality/duration/timing/severity/associated sxs/prior Treatment) 46 year old male presents with nasal congestion, cough, sore throat and body aches that started about 4 to 5 days ago- getting worse with fever yesterday and today of 100. Denies any GI symptoms. No other family members ill. Has taken Nyquil with minimal relief. Has history of possible allergic asthma and has used his Albuterol inhaler with minimal relief. No longer on maintenance medications for allergies.    The history is provided by the patient. The history is limited by a language barrier.    Past Medical History:  Diagnosis Date  . Herpes   . Shingles    History reviewed. No pertinent surgical history. Family History  Problem Relation Age of Onset  . Cancer Father    Social History  Substance Use Topics  . Smoking status: Former Games developermoker  . Smokeless tobacco: Never Used  . Alcohol use No    Review of Systems  Constitutional: Positive for chills, diaphoresis, fatigue and fever. Negative for appetite change.  HENT: Positive for congestion, postnasal drip, rhinorrhea, sinus pain, sinus pressure and sore throat. Negative for ear discharge and ear pain.   Eyes: Negative for discharge.  Respiratory: Positive for cough and chest tightness. Negative for shortness of breath and wheezing.   Cardiovascular: Negative for chest pain.  Gastrointestinal: Negative for abdominal pain, diarrhea, nausea and vomiting.  Musculoskeletal: Negative for arthralgias, neck pain and neck stiffness.  Skin: Negative for rash and wound.  Neurological: Positive for headaches. Negative for dizziness, syncope, weakness and light-headedness.  Hematological: Negative for adenopathy.    Allergies  Penicillins and Amoxicillin  Home Medications    Prior to Admission medications   Medication Sig Start Date End Date Taking? Authorizing Provider  albuterol (PROVENTIL HFA;VENTOLIN HFA) 108 (90 Base) MCG/ACT inhaler Inhale 2 puffs into the lungs every 4 (four) hours as needed for wheezing or shortness of breath. 12/31/15   Pete Glatterawn T Langeland, MD  benzonatate (TESSALON) 100 MG capsule Take 1 capsule (100 mg total) by mouth 3 (three) times daily as needed for cough. 03/18/16   Sudie GrumblingAnn Berry Deontrey Massi, NP  budesonide-formoterol (SYMBICORT) 80-4.5 MCG/ACT inhaler Inhale 2 puffs into the lungs 2 (two) times daily. Try to taper to off if able.  If worse breathing worsens, than resume bid again. 12/31/15   Pete Glatterawn T Langeland, MD  doxycycline (VIBRAMYCIN) 100 MG capsule Take 1 capsule (100 mg total) by mouth 2 (two) times daily. 03/18/16 03/25/16  Sudie GrumblingAnn Berry Stamatia Masri, NP  ipratropium (ATROVENT) 0.06 % nasal spray Place 2 sprays into both nostrils 4 (four) times daily. 3-4 times/ day 08/15/15   Domenick GongAshley Mortenson, MD  loratadine (CLARITIN) 10 MG tablet Take 1 tablet (10 mg total) by mouth daily. 08/26/15   Josalyn Funches, MD  omeprazole (PRILOSEC) 40 MG capsule Take 1 capsule (40 mg total) by mouth daily. 08/26/15   Pete Glatterawn T Langeland, MD  Vitamin D, Ergocalciferol, (DRISDOL) 50000 units CAPS capsule Take 1 capsule (50,000 Units total) by mouth every 7 (seven) days. 01/03/16   Pete Glatterawn T Langeland, MD   Meds Ordered and Administered this Visit  Medications - No data to display  BP 131/67 (BP Location: Left Arm)   Pulse 92   Temp 100 F (37.8 C) (Oral)   Resp 18   SpO2  98%  No data found.   Physical Exam  Constitutional: He is oriented to person, place, and time. He appears well-developed and well-nourished. He appears ill. No distress.  HENT:  Head: Normocephalic and atraumatic.  Right Ear: Hearing, tympanic membrane, external ear and ear canal normal.  Left Ear: Hearing, tympanic membrane, external ear and ear canal normal.  Nose: Mucosal edema and rhinorrhea present.  Right sinus exhibits maxillary sinus tenderness. Right sinus exhibits no frontal sinus tenderness. Left sinus exhibits maxillary sinus tenderness. Left sinus exhibits no frontal sinus tenderness.  Mouth/Throat: Uvula is midline and mucous membranes are normal. Posterior oropharyngeal erythema present.  Neck: Normal range of motion. Neck supple.  Cardiovascular: Normal rate, regular rhythm and normal heart sounds.   Pulmonary/Chest: Effort normal. No respiratory distress. He has decreased breath sounds in the right upper field, the right lower field, the left upper field and the left lower field. He has wheezes in the right upper field and the left upper field. He has no rhonchi. He has no rales.  Lymphadenopathy:    He has cervical adenopathy.       Right cervical: Superficial cervical adenopathy present.       Left cervical: Superficial cervical adenopathy present.  Neurological: He is alert and oriented to person, place, and time.  Skin: Skin is warm and dry. Capillary refill takes less than 2 seconds.  Psychiatric: He has a normal mood and affect. His behavior is normal.    Urgent Care Course     Procedures (including critical care time)  Labs Review Labs Reviewed - No data to display  Imaging Review No results found.   Visual Acuity Review  Right Eye Distance:   Left Eye Distance:   Bilateral Distance:    Right Eye Near:   Left Eye Near:    Bilateral Near:         MDM   1. Acute non-recurrent maxillary sinusitis   2. Bronchitis    Discussed with patient that he may have a combination of a sinus infection and bronchitis. Recommend start Doxycycline 100mg  twice a day as directed. May take Tessalon cough pills 1 every 8 hours as needed. Continue to use Albuterol inhaler 2 puffs every 6 hours as needed. May take Tylenol 1g every 8 hours as needed for fever. Rest. Stay well-hydrated. Note written for work. Follow-up with a primary care provider in 4 to 5 days if not  improving.      Sudie Grumbling, NP 03/18/16 [phone]

## 2016-03-19 ENCOUNTER — Telehealth: Payer: Self-pay | Admitting: Internal Medicine

## 2016-03-19 NOTE — Telephone Encounter (Signed)
Patient was seen in the Urgent Care yesterday and was diagnosed with the flu. Patient would like tamiflu to be called in to help him with his flu symptoms. Please follow up.  Thank you.

## 2016-03-19 NOTE — Telephone Encounter (Signed)
Pt instructed to call UC to get Tamiflu.

## 2016-06-03 ENCOUNTER — Encounter: Payer: Self-pay | Admitting: Internal Medicine

## 2016-06-04 ENCOUNTER — Encounter: Payer: Self-pay | Admitting: Internal Medicine

## 2016-06-07 ENCOUNTER — Encounter: Payer: Self-pay | Admitting: Internal Medicine

## 2016-07-13 ENCOUNTER — Ambulatory Visit: Payer: Self-pay | Attending: Internal Medicine | Admitting: Physician Assistant

## 2016-07-13 ENCOUNTER — Other Ambulatory Visit: Payer: Self-pay | Admitting: Pharmacist

## 2016-07-13 VITALS — BP 97/56 | HR 66 | Temp 98.3°F | Resp 18 | Ht 65.0 in | Wt 187.0 lb

## 2016-07-13 DIAGNOSIS — E559 Vitamin D deficiency, unspecified: Secondary | ICD-10-CM | POA: Insufficient documentation

## 2016-07-13 DIAGNOSIS — J45909 Unspecified asthma, uncomplicated: Secondary | ICD-10-CM

## 2016-07-13 DIAGNOSIS — R05 Cough: Secondary | ICD-10-CM | POA: Insufficient documentation

## 2016-07-13 DIAGNOSIS — J40 Bronchitis, not specified as acute or chronic: Secondary | ICD-10-CM | POA: Insufficient documentation

## 2016-07-13 DIAGNOSIS — Z7951 Long term (current) use of inhaled steroids: Secondary | ICD-10-CM | POA: Insufficient documentation

## 2016-07-13 DIAGNOSIS — B009 Herpesviral infection, unspecified: Secondary | ICD-10-CM | POA: Insufficient documentation

## 2016-07-13 DIAGNOSIS — R21 Rash and other nonspecific skin eruption: Secondary | ICD-10-CM | POA: Insufficient documentation

## 2016-07-13 MED ORDER — BUDESONIDE-FORMOTEROL FUMARATE 80-4.5 MCG/ACT IN AERO: 2.0000 | INHALATION_SPRAY | Freq: Two times a day (BID) | RESPIRATORY_TRACT | 3 refills | Status: DC

## 2016-07-13 MED ORDER — ACYCLOVIR 400 MG PO TABS: 400.0000 mg | ORAL_TABLET | Freq: Two times a day (BID) | ORAL | 6 refills | Status: DC

## 2016-07-13 MED ORDER — ALBUTEROL SULFATE HFA 108 (90 BASE) MCG/ACT IN AERS
2.0000 | INHALATION_SPRAY | RESPIRATORY_TRACT | 2 refills | Status: DC | PRN
Start: 2016-07-13 — End: 2016-12-20

## 2016-07-13 MED ORDER — AZITHROMYCIN 250 MG PO TABS: ORAL_TABLET | ORAL | 0 refills | Status: DC

## 2016-07-13 MED ORDER — PROMETHAZINE-DM 6.25-15 MG/5ML PO SYRP: 5.0000 mL | ORAL_SOLUTION | Freq: Four times a day (QID) | ORAL | 0 refills | Status: DC | PRN

## 2016-07-13 MED ORDER — TRIAMCINOLONE ACETONIDE 0.1 % EX CREA: 1.0000 "application " | TOPICAL_CREAM | Freq: Two times a day (BID) | CUTANEOUS | 0 refills | Status: DC

## 2016-07-13 NOTE — Telephone Encounter (Signed)
[facility]

## 2016-07-13 NOTE — Progress Notes (Signed)
Patient ID: Gabriel Ball, male   DOB: Sep 16, 1970, 46 y.o.   MRN: [id number]    Gabriel Ball, is a 46 y.o. male  [id number]  [id number]  DOB - 1970-09-22  Subjective:  Chief Complaint and HPI: Gabriel Ball is a 46 y.o. male here today for 2 week h/o cough.  Sputum is greenish clear.  No f/c. He needs RF on Acyclovir and symbicort.  Also c/o hyperpigmented and itchy skin on L ankle and L hand/knuckles of 3 fingers X 3 months.  He takes acyclovir prn.  He never took vitamin D he was prescribed.   [employer] interpreters [phone] "Armando."  ROS:   Constitutional:  No f/c, No night sweats, No unexplained weight loss. EENT:  No vision changes, No blurry vision, No hearing changes. No mouth, throat, or ear problems.  Respiratory: + cough, No SOB Cardiac: No CP, no palpitations GI:  No abd pain, No N/V/D. GU: No Urinary s/sx Musculoskeletal: No joint pain Neuro: No headache, no dizziness, no motor weakness.  Skin: + rash Endocrine:  No polydipsia. No polyuria.  Psych: Denies SI/HI  No problems updated.  ALLERGIES: Allergies  Allergen Reactions  . Penicillins   . Amoxicillin Itching    Pruritic rash    PAST MEDICAL HISTORY: Past Medical History:  Diagnosis Date  . Herpes   . Shingles     MEDICATIONS AT HOME: Prior to Admission medications   Medication Sig Start Date End Date Taking? Authorizing Provider  acyclovir (ZOVIRAX) 400 MG tablet Take 1 tablet (400 mg total) by mouth 2 (two) times daily. prn 07/13/16   Anders Simmonds, PA-C  albuterol (PROVENTIL HFA;VENTOLIN HFA) 108 (90 Base) MCG/ACT inhaler Inhale 2 puffs into the lungs every 4 (four) hours as needed for wheezing or shortness of breath. 07/13/16   Anders Simmonds, PA-C  azithromycin (ZITHROMAX) 250 MG tablet Take 2 today then 1 daily 07/13/16   Anders Simmonds, PA-C  benzonatate (TESSALON) 100 MG capsule Take 1 capsule (100 mg total) by mouth 3 (three) times daily as needed for cough. 03/18/16   Sudie Grumbling, NP  budesonide-formoterol (SYMBICORT) 80-4.5 MCG/ACT inhaler Inhale 2 puffs into the lungs 2 (two) times daily. Try to taper to off if able.  If worse breathing worsens, than resume bid again. 07/13/16   Anders Simmonds, PA-C  ipratropium (ATROVENT) 0.06 % nasal spray Place 2 sprays into both nostrils 4 (four) times daily. 3-4 times/ day 08/15/15   Domenick Gong, MD  loratadine (CLARITIN) 10 MG tablet Take 1 tablet (10 mg total) by mouth daily. 08/26/15   Funches, Gerilyn Nestle, MD  omeprazole (PRILOSEC) 40 MG capsule Take 1 capsule (40 mg total) by mouth daily. 08/26/15   Pete Glatter, MD  promethazine-dextromethorphan (PROMETHAZINE-DM) 6.25-15 MG/5ML syrup Take 5 mLs by mouth 4 (four) times daily as needed for cough. 07/13/16   Anders Simmonds, PA-C  triamcinolone cream (KENALOG) 0.1 % Apply 1 application topically 2 (two) times daily. 07/13/16   Anders Simmonds, PA-C  Vitamin D, Ergocalciferol, (DRISDOL) 50000 units CAPS capsule Take 1 capsule (50,000 Units total) by mouth every 7 (seven) days. 01/03/16   Pete Glatter, MD     Objective:  EXAM:   Vitals:   07/13/16 0838  BP: (!) 97/56  Pulse: 66  Resp: 18  Temp: 98.3 F (36.8 C)  TempSrc: Oral  SpO2: 99%  Weight: 187 lb (84.8 kg)  Height: 5\' 5"  (1.651 m)    General appearance : A&OX3.  NAD. Non-toxic-appearing HEENT: Atraumatic and Normocephalic.  PERRLA. EOM intact.  TM clear B. Mouth-MMM, post pharynx WNL w/o erythema, No PND. Neck: supple, no JVD. No cervical lymphadenopathy. No thyromegaly Chest/Lungs:  Breathing-non-labored, Good air entry bilaterally, breath sounds normal without rales, rhonchi, or wheezing  CVS: S1 S2 regular, no murmurs, gallops, rubs Extremities: Bilateral Lower Ext shows no edema, both legs are warm to touch with = pulse throughout Neurology:  CN II-XII grossly intact, Non focal.   Psych:  TP linear. J/I WNL. Normal speech. Appropriate eye contact and affect.  Skin:  + dry and  hyperpigmented skin on dorsum of knuckles of fingers 3,4,5 and L ankle anterior aspect.  Data Review Lab Results  Component Value Date   HGBA1C 5.1 12/31/2015   HGBA1C 5.10 10/31/2014     Assessment & Plan   1. Bronchitis Will cover for atypicals due to length of illness- - azithromycin (ZITHROMAX) 250 MG tablet; Take 2 today then 1 daily  Dispense: 6 tablet; Refill: 0 - promethazine-dextromethorphan (PROMETHAZINE-DM) 6.25-15 MG/5ML syrup; Take 5 mLs by mouth 4 (four) times daily as needed for cough.  Dispense: 118 mL; Refill: 0  2. HSV (herpes simplex virus) infection  - acyclovir (ZOVIRAX) 400 MG tablet; Take 1 tablet (400 mg total) by mouth 2 (two) times daily. prn  Dispense: 60 tablet; Refill: 6  3. Mild asthma without complication, unspecified whether persistent - budesonide-formoterol (SYMBICORT) 80-4.5 MCG/ACT inhaler; Inhale 2 puffs into the lungs 2 (two) times daily. Try to taper to off if able.  If worse breathing worsens, than resume bid again.  Dispense: 1 Inhaler; Refill: 3 - albuterol (PROVENTIL HFA;VENTOLIN HFA) 108 (90 Base) MCG/ACT inhaler; Inhale 2 puffs into the lungs every 4 (four) hours as needed for wheezing or shortness of breath.  Dispense: 1 Inhaler; Refill: 2  4. Rash - CBC with Differential/Platelet - Basic metabolic panel - triamcinolone cream (KENALOG) 0.1 %; Apply 1 application topically 2 (two) times daily.  Dispense: 30 g; Refill: 0  5. Vitamin D deficiency - Vitamin D, 25-hydroxy     Patient have been counseled extensively about nutrition and exercise  Return in about 6 months (around 01/12/2017) for reassign PCP; f/up asthma.  The patient was given clear instructions to go to ER or return to medical center if symptoms don't improve, worsen or new problems develop. The patient verbalized understanding. The patient was told to call to get lab results if they haven't heard anything in the next week.     Georgian CoAngela Jenavive Lamboy, PA-C [facility]  [facility] [place], [place] [phone]   07/13/2016, 9:01 AM

## 2016-07-14 LAB — CBC WITH DIFFERENTIAL/PLATELET
Basophils Absolute: 0 10*3/uL (ref 0.0–0.2)
Basos: 0 %
EOS (ABSOLUTE): 0.3 10*3/uL (ref 0.0–0.4)
EOS: 5 %
HEMATOCRIT: 45.5 % (ref 37.5–51.0)
HEMOGLOBIN: 15.7 g/dL (ref 13.0–17.7)
IMMATURE GRANS (ABS): 0 10*3/uL (ref 0.0–0.1)
IMMATURE GRANULOCYTES: 0 %
LYMPHS: 33 %
Lymphocytes Absolute: 2.1 10*3/uL (ref 0.7–3.1)
MCH: 28.4 pg (ref 26.6–33.0)
MCHC: 34.5 g/dL (ref 31.5–35.7)
MCV: 82 fL (ref 79–97)
MONOCYTES: 5 %
MONOS ABS: 0.3 10*3/uL (ref 0.1–0.9)
NEUTROS PCT: 57 %
Neutrophils Absolute: 3.6 10*3/uL (ref 1.4–7.0)
Platelets: 240 10*3/uL (ref 150–379)
RBC: 5.52 x10E6/uL (ref 4.14–5.80)
RDW: 14.2 % (ref 12.3–15.4)
WBC: 6.4 10*3/uL (ref 3.4–10.8)

## 2016-07-14 LAB — VITAMIN D 25 HYDROXY (VIT D DEFICIENCY, FRACTURES): Vit D, 25-Hydroxy: 16.5 ng/mL — ABNORMAL LOW (ref 30.0–100.0)

## 2016-07-14 LAB — BASIC METABOLIC PANEL
BUN/Creatinine Ratio: 14 (ref 9–20)
BUN: 10 mg/dL (ref 6–24)
CALCIUM: 8.9 mg/dL (ref 8.7–10.2)
CO2: 21 mmol/L (ref 20–29)
Chloride: 104 mmol/L (ref 96–106)
Creatinine, Ser: 0.73 mg/dL — ABNORMAL LOW (ref 0.76–1.27)
GFR, EST AFRICAN AMERICAN: 130 mL/min/{1.73_m2} (ref >59)
GFR, EST NON AFRICAN AMERICAN: 113 mL/min/{1.73_m2} (ref >59)
Glucose: 87 mg/dL (ref 65–99)
POTASSIUM: 4.2 mmol/L (ref 3.5–5.2)
Sodium: 141 mmol/L (ref 134–144)

## 2016-07-29 ENCOUNTER — Other Ambulatory Visit: Payer: Self-pay | Admitting: Physician Assistant

## 2016-07-29 DIAGNOSIS — E559 Vitamin D deficiency, unspecified: Secondary | ICD-10-CM

## 2016-07-29 MED ORDER — VITAMIN D (ERGOCALCIFEROL) 1.25 MG (50000 UNIT) PO CAPS: 50000.0000 [IU] | ORAL_CAPSULE | ORAL | 0 refills | Status: DC

## 2016-08-02 ENCOUNTER — Telehealth: Payer: Self-pay

## 2016-08-02 NOTE — Telephone Encounter (Signed)
-----   Message from Doran DurandNubia K [place] sent at 08/02/2016  3:00 PM EDT ----- Please inform patient of vitamin d level being low and can contribute to fatigue and depression. Pick up the prescription sent to the pharmacy. All other labs are normal. Recheck completed in 3 months

## 2016-08-02 NOTE — Progress Notes (Signed)
Please inform patient of vitamin d level being low and can contribute to fatigue and depression. Pick up the prescription sent to the pharmacy. All other labs are normal. Recheck completed in 3 months

## 2016-08-02 NOTE — Telephone Encounter (Signed)
CMA call regarding lab results   Patient verify DOB  Patient was aware and understood  

## 2016-09-26 ENCOUNTER — Encounter (HOSPITAL_COMMUNITY): Payer: Self-pay | Admitting: *Deleted

## 2016-09-26 ENCOUNTER — Ambulatory Visit (HOSPITAL_COMMUNITY)
Admission: EM | Admit: 2016-09-26 | Discharge: 2016-09-26 | Disposition: A | Discharge status: Home / Self Care | Payer: Self-pay

## 2016-09-26 DIAGNOSIS — T148XXA Other injury of unspecified body region, initial encounter: Secondary | ICD-10-CM

## 2016-09-26 DIAGNOSIS — M549 Dorsalgia, unspecified: Secondary | ICD-10-CM

## 2016-09-26 DIAGNOSIS — H1031 Unspecified acute conjunctivitis, right eye: Secondary | ICD-10-CM

## 2016-09-26 HISTORY — DX: Unspecified asthma, uncomplicated: J45.909

## 2016-09-26 MED ORDER — TETRACAINE HCL 0.5 % OP SOLN
OPHTHALMIC | Status: AC
  Filled 2016-09-26: qty 4

## 2016-09-26 MED ORDER — KETOROLAC TROMETHAMINE 60 MG/2ML IM SOLN
INTRAMUSCULAR | Status: AC
  Filled 2016-09-26: qty 2

## 2016-09-26 MED ORDER — FLUORESCEIN SODIUM 0.6 MG OP STRP
ORAL_STRIP | OPHTHALMIC | Status: AC
  Filled 2016-09-26: qty 1

## 2016-09-26 MED ORDER — KETOROLAC TROMETHAMINE 60 MG/2ML IM SOLN
60.0000 mg | Freq: Once | INTRAMUSCULAR | Status: AC
  Administered 2016-09-26: 60 mg via INTRAMUSCULAR

## 2016-09-26 MED ORDER — TRAMADOL HCL 50 MG PO TABS
50.0000 mg | ORAL_TABLET | Freq: Four times a day (QID) | ORAL | 0 refills | Status: DC | PRN
Start: 2016-09-26 — End: 2016-12-20

## 2016-09-26 MED ORDER — NAPROXEN 375 MG PO TABS: 375.0000 mg | ORAL_TABLET | Freq: Two times a day (BID) | ORAL | 0 refills | Status: DC

## 2016-09-26 MED ORDER — POLYMYXIN B-TRIMETHOPRIM 10000-0.1 UNIT/ML-% OP SOLN: 1.0000 [drp] | OPHTHALMIC | 0 refills | Status: DC

## 2016-09-26 MED ORDER — KETOTIFEN FUMARATE 0.025 % OP SOLN: 1.0000 [drp] | Freq: Two times a day (BID) | OPHTHALMIC | 0 refills | Status: DC

## 2016-09-26 NOTE — Discharge Instructions (Signed)
Alternate ice and heat to the back. Take the medication for pain. Use the eyedrops as directed in the right eye. If she continued to have pain longer than a day call the ophthalmologist for an appointment. Limit your activity so that your back we will heal.

## 2016-09-26 NOTE — ED Triage Notes (Signed)
C/O right eye pain and tearing x 4 days; also c/o pain across mid-back x 4 days.  Denies any cold sxs or fevers.

## 2016-09-26 NOTE — ED Provider Notes (Signed)
[facility]    CSN: [id number] Arrival date & time: 09/26/16  1835     History   Chief Complaint Chief Complaint  Patient presents with  . Eye Pain  . Back Pain    [name] is a 46 y.o. male.   46 year old Hispanic male speaks Spanish only. Interpreter used for part of the time. Even with interpreter communication difficult at times. The patient had multiple complaints. This took a while to narrow down the 2 most important ones. The first complaint is that of pain in the right back involving the right thoracic musculature and ribs. 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 The pain is worse with movement, bending, twisting, pulling and sometimes deep breathing. No cough, shortness of breath or fever.   Second complaint today with tonight is the sensation of a foreign body to the right eye. He is complaining of eye discomfort and redness.       Past Medical History:  Diagnosis Date  . Asthma   . Herpes   . Shingles     Patient Active Problem List   Diagnosis Date Noted  . HSV (herpes simplex virus) infection 02/24/2015  . Anxiety 04/02/2014    History reviewed. No pertinent surgical history.     Home Medications    Prior to Admission medications   Medication Sig Start Date End Date Taking? Authorizing Provider  acyclovir (ZOVIRAX) 400 MG tablet Take 1 tablet (400 mg total) by mouth 2 (two) times daily. prn 07/13/16  Yes McClung, Angela M, PA-C  Budesonide (PULMICORT IN) Inhale into the lungs.   Yes [provider]  budesonide-formoterol (SYMBICORT) 80-4.5 MCG/ACT inhaler Inhale 2 puffs into the lungs 2 (two) times daily. Try to taper to off if able.  If worse breathing worsens, than resume bid again. 07/13/16  Yes Anders Simmonds, PA-C  albuterol (PROVENTIL HFA;VENTOLIN HFA) 108 (90 Base) MCG/ACT inhaler Inhale 2 puffs into the lungs every 4 (four) hours as needed for  wheezing or shortness of breath. 07/13/16   Anders Simmonds, PA-C  benzonatate (TESSALON) 100 MG capsule Take 1 capsule (100 mg total) by mouth 3 (three) times daily as needed for cough. 03/18/16   Sudie Grumbling, NP  ipratropium (ATROVENT) 0.06 % nasal spray Place 2 sprays into both nostrils 4 (four) times daily. 3-4 times/ day 08/15/15   Domenick Gong, MD  ketotifen (ZADITOR) 0.025 % ophthalmic solution Place 1 drop into the right eye 2 (two) times daily. OTC OK 09/26/16   Hayden Rasmussen, NP  loratadine (CLARITIN) 10 MG tablet Take 1 tablet (10 mg total) by mouth daily. 08/26/15   Funches, Gerilyn Nestle, MD  naproxen (NAPROSYN) 375 MG tablet Take 1 tablet (375 mg total) by mouth 2 (two) times daily. 09/26/16   Hayden Rasmussen, NP  omeprazole (PRILOSEC) 40 MG capsule Take 1 capsule (40 mg total) by mouth daily. 08/26/15   Pete Glatter, MD  promethazine-dextromethorphan (PROMETHAZINE-DM) 6.25-15 MG/5ML syrup Take 5 mLs by mouth 4 (four) times daily as needed for cough. 07/13/16   Anders Simmonds, PA-C  traMADol (ULTRAM) 50 MG tablet Take 1 tablet (50 mg total) by mouth every 6 (six) hours as needed. 09/26/16   Hayden Rasmussen, NP  triamcinolone cream (KENALOG) 0.1 % Apply 1 application topically 2 (two) times daily. 07/13/16   Anders Simmonds, PA-C  trimethoprim-polymyxin b (POLYTRIM) ophthalmic solution Place 1 drop into  the right eye every 4 (four) hours. 09/26/16   Hayden Rasmussen, NP  Vitamin D, Ergocalciferol, (DRISDOL) 50000 units CAPS capsule Take 1 capsule (50,000 Units total) by mouth every 7 (seven) days. 07/29/16   Anders Simmonds, PA-C    Family History Family History  Problem Relation Age of Onset  . Cancer Father     Social History Social History  Substance Use Topics  . Smoking status: Former Games developer  . Smokeless tobacco: Never Used  . Alcohol use No     Allergies   Penicillins and Amoxicillin   Review of Systems Review of Systems  HENT: Negative.   Eyes: Positive for photophobia, pain,  discharge and redness.  Respiratory: Negative.   Musculoskeletal: Positive for back pain and myalgias.  All other systems reviewed and are negative.    Physical Exam Triage Vital Signs ED Triage Vitals  Enc Vitals Group     BP 09/26/16 1921 116/66     Pulse Rate 09/26/16 1921 87     Resp 09/26/16 1921 16     Temp 09/26/16 1921 98.8 F (37.1 C)     Temp Source 09/26/16 1921 Oral     SpO2 09/26/16 1921 97 %     Weight --      Height --      Head Circumference --      Peak Flow --      Pain Score 09/26/16 1923 8     Pain Loc --      Pain Edu? --      Excl. in GC? --    No data found.   Updated Vital Signs BP 116/66   Pulse 87   Temp 98.8 F (37.1 C) (Oral)   Resp 16   SpO2 97%   Visual Acuity Right Eye Distance:   Left Eye Distance:   Bilateral Distance:    Right Eye Near:   Left Eye Near:    Bilateral Near:     Physical Exam  Constitutional: He is oriented to person, place, and time. He appears well-developed and well-nourished. No distress.  Eyes: Pupils are equal, round, and reactive to light. EOM are normal.  Right sclera and conjunctiva erythematous. Clear watery discharge. Tetracaine eye drops placed to the right eye. Forcing stain used. Under blue light no uptake is seen. No defects or foreign bodies. Upper eyelid everted however working with the patient was difficult and he resisted the exam despite stating he had no pain in the eye. Eye was irrigated however irrigation was limited due to the fact that the water felt cold and he could not tolerate it. Again denied pain after effective anesthesia. Anterior chambers clear. No corneal abrasion seen. No limbal flush.  Neck: Normal range of motion. Neck supple.  Cardiovascular: Normal rate.   Pulmonary/Chest: Effort normal. No respiratory distress.  Musculoskeletal: He exhibits tenderness. He exhibits no edema.  Marked tenderness to the posterior right thoracic ribs. The right parathoracic musculature tender.  Patient has much pain when trying to lie supine and place himself in a sitting position and other movements. No direct tenderness over the thoracic spine. No swelling or discoloration.  Neurological: He is alert and oriented to person, place, and time. He exhibits normal muscle tone.  Skin: Skin is warm and dry.  Psychiatric: He has a normal mood and affect.  Nursing note and vitals reviewed.    UC Treatments / Results  Labs (all labs ordered are listed, but only abnormal results are displayed) Labs  Reviewed - No data to display  EKG  EKG Interpretation None       Radiology No results found.  Procedures Procedures (including critical care time)  Medications Ordered in UC Medications  ketorolac (TORADOL) injection 60 mg (60 mg Intramuscular Given 09/26/16 2044)     Initial Impression / Assessment and Plan / UC Course  I have reviewed the triage vital signs and the nursing notes.  Pertinent labs & imaging results that were available during my care of the patient were reviewed by me and considered in my medical decision making (see chart for details).       Final Clinical Impressions(s) / UC Diagnoses   Final diagnoses:  Musculoskeletal back pain  Muscle strain  Acute conjunctivitis of right eye, unspecified acute conjunctivitis type    New Prescriptions New Prescriptions   KETOTIFEN (ZADITOR) 0.025 % OPHTHALMIC SOLUTION    Place 1 drop into the right eye 2 (two) times daily. OTC OK   NAPROXEN (NAPROSYN) 375 MG TABLET    Take 1 tablet (375 mg total) by mouth 2 (two) times daily.   TRAMADOL (ULTRAM) 50 MG TABLET    Take 1 tablet (50 mg total) by mouth every 6 (six) hours as needed.   TRIMETHOPRIM-POLYMYXIN B (POLYTRIM) OPHTHALMIC SOLUTION    Place 1 drop into the right eye every 4 (four) hours.     Controlled Substance Prescriptions [place] Controlled Substance Registry consulted? Not Applicable   Hayden RasmussenMabe, Jalissa Heinzelman, NP 09/26/16 2050

## 2016-12-20 ENCOUNTER — Ambulatory Visit: Payer: Self-pay | Attending: Nurse Practitioner | Admitting: Nurse Practitioner

## 2016-12-20 ENCOUNTER — Encounter: Payer: Self-pay | Admitting: Nurse Practitioner

## 2016-12-20 VITALS — BP 97/51 | HR 73 | Temp 98.5°F | Resp 12 | Ht 66.0 in | Wt 196.0 lb

## 2016-12-20 DIAGNOSIS — B009 Herpesviral infection, unspecified: Secondary | ICD-10-CM

## 2016-12-20 DIAGNOSIS — Z880 Allergy status to penicillin: Secondary | ICD-10-CM | POA: Insufficient documentation

## 2016-12-20 DIAGNOSIS — E559 Vitamin D deficiency, unspecified: Secondary | ICD-10-CM

## 2016-12-20 DIAGNOSIS — R21 Rash and other nonspecific skin eruption: Secondary | ICD-10-CM

## 2016-12-20 DIAGNOSIS — Z7951 Long term (current) use of inhaled steroids: Secondary | ICD-10-CM | POA: Insufficient documentation

## 2016-12-20 DIAGNOSIS — J45909 Unspecified asthma, uncomplicated: Secondary | ICD-10-CM

## 2016-12-20 DIAGNOSIS — Z79899 Other long term (current) drug therapy: Secondary | ICD-10-CM | POA: Insufficient documentation

## 2016-12-20 DIAGNOSIS — N529 Male erectile dysfunction, unspecified: Secondary | ICD-10-CM

## 2016-12-20 DIAGNOSIS — Z23 Encounter for immunization: Secondary | ICD-10-CM | POA: Insufficient documentation

## 2016-12-20 MED ORDER — LORATADINE 10 MG PO TABS: 10.0000 mg | ORAL_TABLET | Freq: Every day | ORAL | 3 refills | Status: DC

## 2016-12-20 MED ORDER — KETOTIFEN FUMARATE 0.025 % OP SOLN: 1.0000 [drp] | Freq: Two times a day (BID) | OPHTHALMIC | 0 refills | Status: DC | PRN

## 2016-12-20 MED ORDER — BUDESONIDE-FORMOTEROL FUMARATE 80-4.5 MCG/ACT IN AERO: 2.0000 | INHALATION_SPRAY | Freq: Two times a day (BID) | RESPIRATORY_TRACT | 6 refills | Status: DC

## 2016-12-20 MED ORDER — VITAMIN D (ERGOCALCIFEROL) 1.25 MG (50000 UNIT) PO CAPS: 50000.0000 [IU] | ORAL_CAPSULE | ORAL | 0 refills | Status: AC

## 2016-12-20 MED ORDER — TRIAMCINOLONE ACETONIDE 0.1 % EX CREA: 1.0000 "application " | TOPICAL_CREAM | Freq: Two times a day (BID) | CUTANEOUS | 0 refills | Status: DC

## 2016-12-20 MED ORDER — ACYCLOVIR 400 MG PO TABS: 800.0000 mg | ORAL_TABLET | Freq: Two times a day (BID) | ORAL | 0 refills | Status: AC

## 2016-12-20 MED ORDER — ALBUTEROL SULFATE HFA 108 (90 BASE) MCG/ACT IN AERS: 2.0000 | INHALATION_SPRAY | RESPIRATORY_TRACT | 1 refills | Status: DC | PRN

## 2016-12-20 MED ORDER — SILDENAFIL CITRATE 50 MG PO TABS: 50.0000 mg | ORAL_TABLET | Freq: Every day | ORAL | 0 refills | Status: DC | PRN

## 2016-12-20 NOTE — Progress Notes (Signed)
Assessment & Plan:  Gabriel Ball was seen today for establish care.  Diagnoses and all orders for this visit:  Asthma due to environmental allergies -     albuterol (PROVENTIL HFA;VENTOLIN HFA) 108 (90 Base) MCG/ACT inhaler; Inhale 2 puffs into the lungs every 4 (four) hours as needed for wheezing or shortness of breath. -     budesonide-formoterol (SYMBICORT) 80-4.5 MCG/ACT inhaler; Inhale 2 puffs into the lungs 2 (two) times daily. Try to taper to off if able.  If worse breathing worsens, than resume bid again. -     Pulmonary function test; Future -     loratadine (CLARITIN) 10 MG tablet; Take 1 tablet (10 mg total) by mouth daily.  Vitamin D deficiency -     Vitamin D, Ergocalciferol, (DRISDOL) 50000 units CAPS capsule; Take 1 capsule (50,000 Units total) by mouth every 7 (seven) days.  HSV (herpes simplex virus) infection -     acyclovir (ZOVIRAX) 400 MG tablet; Take 2 tablets (800 mg total) by mouth 2 (two) times daily for 5 days. Prn Patient reports being diagnosed with HSV over 15 years ago. He takes acyclovir as needed for outbreaks.   Rash -     triamcinolone cream (KENALOG) 0.1 %; Apply 1 application topically 2 (two) times daily.  Erectile dysfunction, unspecified erectile dysfunction type -     sildenafil (VIAGRA) 50 MG tablet; Take 1 tablet (50 mg total) by mouth daily as needed for erectile dysfunction. Take 1hour prior to sexual activity.  Immunization due -     Flu Vaccine QUAD 6+ mos PF IM (Fluarix Quad PF)    Patient has been counseled on age-appropriate routine health concerns for screening and prevention. These are reviewed and up-to-date. Referrals have been placed accordingly. Immunizations are up-to-date or declined.    Subjective:   Chief Complaint  Patient presents with  . Establish Care    Patient stated he is here for anemia. Patient stated that he still have a cough and would like to know if he still need to use inhaler.   HPI  VRI was used to  communicate directly with patient for the entire encounter including providing detailed patient instructions.   [name] 46 y.o. male presents to office today to establish care. He has a history of bronchospasms and asthma due to environmental allergies. He unfortunately has not been using his albuterol, symbicort inhaler or loratadine and atrovent nasal spray due to miscommunication. He reports he stopped using the atrovent nasal spray because it made his nose and throat feel funny.  He is currently endorsing a persistent cough.   Asthma Patient presents for evaluation of productive cough and non productive cough. .  The patient has been previously diagnosed with asthma in 2017. Symptoms currently include non-productive cough and occur daily.  Observed precipitants include no identifiable factor.  Current limitations in activity from asthma: none.  Number of days of school or work missed in the last month: 1. Needs PFT. I am not convinced he truly has asthma.   Does he do nebulizer treatments? no Does he use an inhaler? yes Does he use a spacer w/MDIs? no Does he monitor peak flow rates? nono  What is his personal best peak flow rate: Needs PFT   Erectile Dysfunction  Patient complains of erectile dysfunction.  Onset of dysfunction was several years ago and was gradual in onset.  Patient states the nature of difficulty is premature ejaculation. Full erections occur with intercourse, on awakening and spontaneously.  Partial erections occur never. Libido is not affected. Risk factors for ED include antihypertensive.  Patient denies history of cranial, spinal, or pelvic trauma, beta blocker use and hypogonadism. Patient's expectations as to sexual function achieve, maintain an erection and satisfy his wife with longer durations of sexual activity.  Patient's description of relationship w/partner (wife does not enjoy sex due to his premature ejaculation).  Previous treatment of ED includes NONE.    Review of Systems  Constitutional: Negative for fever, malaise/fatigue and weight loss.  HENT: Negative.  Negative for nosebleeds.   Eyes: Negative.  Negative for blurred vision, double vision and photophobia.  Respiratory: Positive for cough, sputum production and shortness of breath.   Cardiovascular: Negative.  Negative for chest pain, palpitations and leg swelling.  Gastrointestinal: Negative.  Negative for abdominal pain, constipation, diarrhea, heartburn, nausea and vomiting.  Genitourinary: Negative.   Musculoskeletal: Negative.  Negative for myalgias.  Skin: Positive for itching and rash.  Neurological: Negative.  Negative for dizziness, focal weakness, seizures and headaches.  Endo/Heme/Allergies: Positive for environmental allergies.  Psychiatric/Behavioral: Negative.  Negative for suicidal ideas.    Past Medical History:  Diagnosis Date  . Asthma   . Herpes   . Shingles     History reviewed. No pertinent surgical history.  Family History  Problem Relation Age of Onset  . Cancer Father     Social History Reviewed with no changes to be made today.   Outpatient Medications Prior to Visit  Medication Sig Dispense Refill  . budesonide-formoterol (SYMBICORT) 80-4.5 MCG/ACT inhaler Inhale 2 puffs into the lungs 2 (two) times daily. Try to taper to off if able.  If worse breathing worsens, than resume bid again. 1 Inhaler 3  . acyclovir (ZOVIRAX) 400 MG tablet Take 1 tablet (400 mg total) by mouth 2 (two) times daily. prn (Patient not taking: Reported on 12/20/2016) 60 tablet 6  . albuterol (PROVENTIL HFA;VENTOLIN HFA) 108 (90 Base) MCG/ACT inhaler Inhale 2 puffs into the lungs every 4 (four) hours as needed for wheezing or shortness of breath. (Patient not taking: Reported on 12/20/2016) 1 Inhaler 2  . benzonatate (TESSALON) 100 MG capsule Take 1 capsule (100 mg total) by mouth 3 (three) times daily as needed for cough. (Patient not taking: Reported on 12/20/2016) 21 capsule  0  . Budesonide (PULMICORT IN) Inhale into the lungs.    Marland Kitchen. ipratropium (ATROVENT) 0.06 % nasal spray Place 2 sprays into both nostrils 4 (four) times daily. 3-4 times/ day (Patient not taking: Reported on 12/20/2016) 15 mL 0  . ketotifen (ZADITOR) 0.025 % ophthalmic solution Place 1 drop into the right eye 2 (two) times daily. OTC OK (Patient not taking: Reported on 12/20/2016) 5 mL 0  . loratadine (CLARITIN) 10 MG tablet Take 1 tablet (10 mg total) by mouth daily. (Patient not taking: Reported on 12/20/2016) 90 tablet 3  . naproxen (NAPROSYN) 375 MG tablet Take 1 tablet (375 mg total) by mouth 2 (two) times daily. (Patient not taking: Reported on 12/20/2016) 20 tablet 0  . omeprazole (PRILOSEC) 40 MG capsule Take 1 capsule (40 mg total) by mouth daily. (Patient not taking: Reported on 12/20/2016) 30 capsule 3  . promethazine-dextromethorphan (PROMETHAZINE-DM) 6.25-15 MG/5ML syrup Take 5 mLs by mouth 4 (four) times daily as needed for cough. (Patient not taking: Reported on 12/20/2016) 118 mL 0  . traMADol (ULTRAM) 50 MG tablet Take 1 tablet (50 mg total) by mouth every 6 (six) hours as needed. (Patient not taking: Reported on 12/20/2016) 15  tablet 0  . triamcinolone cream (KENALOG) 0.1 % Apply 1 application topically 2 (two) times daily. (Patient not taking: Reported on 12/20/2016) 30 g 0  . trimethoprim-polymyxin b (POLYTRIM) ophthalmic solution Place 1 drop into the right eye every 4 (four) hours. (Patient not taking: Reported on 12/20/2016) 10 mL 0  . Vitamin D, Ergocalciferol, (DRISDOL) 50000 units CAPS capsule Take 1 capsule (50,000 Units total) by mouth every 7 (seven) days. (Patient not taking: Reported on 12/20/2016) 16 capsule 0   No facility-administered medications prior to visit.     Allergies  Allergen Reactions  . Penicillins   . Amoxicillin Itching    Pruritic rash       Objective:    BP (!) 97/51 (BP Location: Left Arm, Patient Position: Sitting, Cuff Size: Normal)   Pulse 73    Temp 98.5 F (36.9 C) (Oral)   Resp 12   Ht 5\' 6"  (1.676 m)   Wt 196 lb (88.9 kg)   SpO2 98%   BMI 31.64 kg/m  Wt Readings from Last 3 Encounters:  12/20/16 196 lb (88.9 kg)  07/13/16 187 lb (84.8 kg)  12/31/15 194 lb 12.8 oz (88.4 kg)    Physical Exam  Constitutional: He is oriented to person, place, and time. He appears well-developed and well-nourished. He is cooperative.  HENT:  Head: Normocephalic and atraumatic.  Eyes: EOM are normal.  Neck: Normal range of motion.  Cardiovascular: Normal rate, regular rhythm, normal heart sounds and intact distal pulses. Exam reveals no gallop and no friction rub.  No murmur heard. Pulmonary/Chest: Effort normal and breath sounds normal. No tachypnea. No respiratory distress. He has no decreased breath sounds. He has no wheezes. He has no rhonchi. He has no rales. He exhibits no tenderness.  Abdominal: Soft. Bowel sounds are normal.  Musculoskeletal: Normal range of motion. He exhibits no edema.  Neurological: He is alert and oriented to person, place, and time. Coordination normal.  Skin: Skin is warm and dry.     Psychiatric: He has a normal mood and affect. His behavior is normal. Judgment and thought content normal.  Nursing note and vitals reviewed.        Patient has been counseled extensively about nutrition and exercise as well as the importance of adherence with medications and regular follow-up. The patient was given clear instructions to go to ER or return to medical center if symptoms don't improve, worsen or new problems develop. The patient verbalized understanding.   Follow-up: Return in about 3 months (around 03/20/2017).   Claiborne RiggZelda W Fleming, FNP-BC [facility], [place] [phone]   12/20/2016, 11:51 AM

## 2016-12-23 ENCOUNTER — Ambulatory Visit (HOSPITAL_COMMUNITY)
Admission: RE | Admit: 2016-12-23 | Discharge: 2016-12-23 | Disposition: A | Discharge status: Home / Self Care | Payer: Self-pay | Source: Ambulatory Visit | Attending: Nurse Practitioner | Admitting: Nurse Practitioner

## 2016-12-23 DIAGNOSIS — J45909 Unspecified asthma, uncomplicated: Secondary | ICD-10-CM | POA: Insufficient documentation

## 2016-12-23 LAB — PULMONARY FUNCTION TEST
DL/VA % PRED: 135 %
DL/VA: 5.94 ml/min/mmHg/L
DLCO UNC % PRED: 130 %
DLCO unc: 35.05 ml/min/mmHg
FEF 25-75 Pre: 3.84 L/sec
FEF2575-%Pred-Pre: 114 %
FEV1-%Pred-Pre: 101 %
FEV1-PRE: 3.64 L
FEV1FVC-%PRED-PRE: 107 %
FEV6-%PRED-PRE: 97 %
FEV6-PRE: 4.28 L
FEV6FVC-%Pred-Pre: 103 %
FVC-%PRED-PRE: 94 %
FVC-Pre: 4.28 L
PRE FEV1/FVC RATIO: 85 %
PRE FEV6/FVC RATIO: 100 %

## 2017-03-21 ENCOUNTER — Encounter: Payer: Self-pay | Admitting: Nurse Practitioner

## 2017-03-21 ENCOUNTER — Ambulatory Visit: Payer: Self-pay | Attending: Nurse Practitioner | Admitting: Nurse Practitioner

## 2017-03-21 VITALS — BP 113/68 | HR 76 | Temp 99.5°F | Ht 66.0 in | Wt 195.2 lb

## 2017-03-21 DIAGNOSIS — M549 Dorsalgia, unspecified: Secondary | ICD-10-CM | POA: Insufficient documentation

## 2017-03-21 DIAGNOSIS — Z791 Long term (current) use of non-steroidal anti-inflammatories (NSAID): Secondary | ICD-10-CM | POA: Insufficient documentation

## 2017-03-21 DIAGNOSIS — Z809 Family history of malignant neoplasm, unspecified: Secondary | ICD-10-CM | POA: Insufficient documentation

## 2017-03-21 DIAGNOSIS — Z880 Allergy status to penicillin: Secondary | ICD-10-CM | POA: Insufficient documentation

## 2017-03-21 DIAGNOSIS — M542 Cervicalgia: Secondary | ICD-10-CM | POA: Insufficient documentation

## 2017-03-21 DIAGNOSIS — J45909 Unspecified asthma, uncomplicated: Secondary | ICD-10-CM | POA: Insufficient documentation

## 2017-03-21 DIAGNOSIS — Z79899 Other long term (current) drug therapy: Secondary | ICD-10-CM | POA: Insufficient documentation

## 2017-03-21 DIAGNOSIS — J029 Acute pharyngitis, unspecified: Secondary | ICD-10-CM | POA: Insufficient documentation

## 2017-03-21 DIAGNOSIS — G8929 Other chronic pain: Secondary | ICD-10-CM | POA: Insufficient documentation

## 2017-03-21 MED ORDER — TIZANIDINE HCL 4 MG PO CAPS: 4.0000 mg | ORAL_CAPSULE | Freq: Three times a day (TID) | ORAL | 0 refills | Status: DC | PRN

## 2017-03-21 MED ORDER — MELOXICAM 15 MG PO TABS: 15.0000 mg | ORAL_TABLET | Freq: Every day | ORAL | 1 refills | Status: DC

## 2017-03-21 MED ORDER — LIDOCAINE VISCOUS 2 % MT SOLN: 15.0000 mL | OROMUCOSAL | 1 refills | Status: DC | PRN

## 2017-03-21 NOTE — Patient Instructions (Addendum)
Visine Totality Multi Symptom Relief Red Eye Drop   Musculoskeletal Pain Musculoskeletal pain is muscle and bone aches and pains. This pain can occur in any part of the body. Follow these instructions at home:  Only take medicines for pain, discomfort, or fever as told by your health care provider.  You may continue all activities unless the activities cause more pain. When the pain lessens, slowly resume normal activities. Gradually increase the intensity and duration of the activities or exercise.  During periods of severe pain, bed rest may be helpful. Lie or sit in any position that is comfortable, but get out of bed and walk around at least every several hours.  If directed, put ice on the injured area. ? Put ice in a plastic bag. ? Place a towel between your skin and the bag. ? Leave the ice on for 20 minutes, 2-3 times a day. Contact a health care provider if:  Your pain is getting worse.  Your pain is not relieved with medicines.  You lose function in the area of the pain if the pain is in your arms, legs, or neck. This information is not intended to replace advice given to you by your health care provider. Make sure you discuss any questions you have with your health care provider. Document Released: 01/04/2005 Document Revised: 06/17/2015 Document Reviewed: 09/08/2012 Elsevier Interactive Patient Education  2017 [employer]  Cervical Sprain A cervical sprain is a stretch or tear in one or more of the tough, cord-like tissues that connect bones (ligaments) in the neck. Cervical sprains can range from mild to severe. Severe cervical sprains can cause the spinal bones (vertebrae) in the neck to be unstable. This can lead to spinal cord damage and can result in serious nervous system problems. The amount of time that it takes for a cervical sprain to get better depends on the cause and extent of the injury. Most cervical sprains heal in 4-6 weeks. What are the  causes? Cervical sprains may be caused by an injury (trauma), such as from a motor vehicle accident, a fall, or sudden forward and backward whipping movement of the head and neck (whiplash injury). Mild cervical sprains may be caused by wear and tear over time, such as from poor posture, sitting in a chair that does not provide support, or looking up or down for long periods of time. What increases the risk? The following factors may make you more likely to develop this condition:  Participating in activities that have a high risk of trauma to the neck. These include contact sports, auto racing, gymnastics, and diving.  Taking risks when driving or riding in a motor vehicle, such as speeding.  Having osteoarthritis of the spine.  Having poor strength and flexibility of the neck.  A previous neck injury.  Having poor posture.  Spending a lot of time in certain positions that put stress on the neck, such as sitting at a computer for long periods of time.  What are the signs or symptoms? Symptoms of this condition include:  Pain, soreness, stiffness, tenderness, swelling, or a burning sensation in the front, back, or sides of the neck.  Sudden tightening of neck muscles that you cannot control (muscle spasms).  Pain in the shoulders or upper back.  Limited ability to move the neck.  Headache.  Dizziness.  Nausea.  Vomiting.  Weakness, numbness, or tingling in a hand or an arm.  Symptoms may develop right away after injury, or they may develop  over a few days. In some cases, symptoms may go away with treatment and return (recur) over time. How is this diagnosed? This condition may be diagnosed based on:  Your medical history.  Your symptoms.  Any recent injuries or known neck problems that you have, such as arthritis in the neck.  A physical exam.  Imaging tests, such as: ? X-rays. ? MRI. ? CT scan.  How is this treated? This condition is treated by resting and  icing the injured area and doing physical therapy exercises. Depending on the severity of your condition, treatment may also include:  Keeping your neck in place (immobilized) for periods of time. This may be done using: ? A cervical collar. This supports your chin and the back of your head. ? A cervical traction device. This is a sling that holds up your head. This removes weight and pressure from your neck, and it may help to relieve pain.  Medicines that help to relieve pain and inflammation.  Medicines that help to relax your muscles (muscle relaxants).  Surgery. This is rare.  Follow these instructions at home: If you have a cervical collar:  Wear it as told by your health care provider. Do not remove the collar unless instructed by your health care provider.  Ask your health care provider before you make any adjustments to your collar.  If you have long hair, keep it outside of the collar.  Ask your health care provider if you can remove the collar for cleaning and bathing. If you are allowed to remove the collar for cleaning or bathing: ? Follow instructions from your health care provider about how to remove the collar safely. ? Clean the collar by wiping it with mild soap and water and drying it completely. ? If your collar has removable pads, remove them every 1-2 days and wash them by hand with soap and water. Let them air-dry completely before you put them back in the collar. ? Check your skin under the collar for irritation or sores. If you see any, tell your health care provider. Managing pain, stiffness, and swelling  If directed, use a cervical traction device as told by your health care provider.  If directed, apply heat to the affected area before you do your physical therapy or as often as told by your health care provider. Use the heat source that your health care provider recommends, such as a moist heat pack or a heating pad. ? Place a towel between your skin and  the heat source. ? Leave the heat on for 20-30 minutes. ? Remove the heat if your skin turns bright red. This is especially important if you are unable to feel pain, heat, or cold. You may have a greater risk of getting burned.  If directed, put ice on the affected area: ? Put ice in a plastic bag. ? Place a towel between your skin and the bag. ? Leave the ice on for 20 minutes, 2-3 times a day. Activity  Do not drive while wearing a cervical collar. If you do not have a cervical collar, ask your health care provider if it is safe to drive while your neck heals.  Do not drive or use heavy machinery while taking prescription pain medicine or muscle relaxants, unless your health care provider approves.  Do not lift anything that is heavier than 10 lb (4.5 kg) until your health care provider tells you that it is safe.  Rest as directed by your health care  provider. Avoid positions and activities that make your symptoms worse. Ask your health care provider what activities are safe for you.  If physical therapy was prescribed, do exercises as told by your health care provider or physical therapist. General instructions  Take over-the-counter and prescription medicines only as told by your health care provider.  Do not use any products that contain nicotine or tobacco, such as cigarettes and e-cigarettes. These can delay healing. If you need help quitting, ask your health care provider.  Keep all follow-up visits as told by your health care provider or physical therapist. This is important. How is this prevented? To prevent a cervical sprain from happening again:  Use and maintain good posture. Make any needed adjustments to your workstation to help you use good posture.  Exercise regularly as directed by your health care provider or physical therapist.  Avoid risky activities that may cause a cervical sprain.  Contact a health care provider if:  You have symptoms that get worse or do  not get better after 2 weeks of treatment.  You have pain that gets worse or does not get better with medicine.  You develop new, unexplained symptoms.  You have sores or irritated skin on your neck from wearing your cervical collar. Get help right away if:  You have severe pain.  You develop numbness, tingling, or weakness in any part of your body.  You cannot move a part of your body (you have paralysis).  You have neck pain along with: ? Severe dizziness. ? Headache. Summary  A cervical sprain is a stretch or tear in one or more of the tough, cord-like tissues that connect bones (ligaments) in the neck.  Cervical sprains may be caused by an injury (trauma), such as from a motor vehicle accident, a fall, or sudden forward and backward whipping movement of the head and neck (whiplash injury).  Symptoms may develop right away after injury, or they may develop over a few days.  This condition is treated by resting and icing the injured area and doing physical therapy exercises. This information is not intended to replace advice given to you by your health care provider. Make sure you discuss any questions you have with your health care provider. Document Released: 11/01/2006 Document Revised: 09/03/2015 Document Reviewed: 09/03/2015 Elsevier Interactive Patient Education  [employer]2018 [employer]

## 2017-03-21 NOTE — Progress Notes (Signed)
Assessment & Plan:  Gabriel Ball was seen today for follow-up and neck pain.  Diagnoses and all orders for this visit:  Sore throat -     lidocaine (XYLOCAINE) 2 % solution; Use as directed 15 mLs in the mouth or throat as needed for mouth pain.  Neck pain, acute -     meloxicam (MOBIC) 15 MG tablet; Take 1 tablet (15 mg total) by mouth daily. -     tiZANidine (ZANAFLEX) 4 MG capsule; Take 1 capsule (4 mg total) by mouth 3 (three) times daily as needed for muscle spasms. May alternate with heat and ice application for pain relief. May also alternate with acetaminophen  for back pain. Other alternatives include massage and or acupuncture.   Patient has been counseled on age-appropriate routine health concerns for screening and prevention. These are reviewed and up-to-date. Referrals have been placed accordingly. Immunizations are up-to-date or declined.    Subjective:   Chief Complaint  Patient presents with  . Follow-up    Patient is here for a follow-up.  . Neck Pain    Patient stated the back of his neck hurts for 8 days already and back pain. Pt's stated it's like burning pain.    [name] 47 y.o. male presents to office today with complaints of sore throat, neck and back pain. VRI was used to communicate directly with patient for the entire encounter including providing detailed patient instructions.   Neck and Back Pain Neck pain started 8 days ago. He denies any injury or trauma to his neck or back. 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 Most of his work involves lifting his head, bending and exteninding his neck for prolonged periods of time.  He has taken advil which provides little relief of pain. He also tried heat application which did provide more relief than the advil. He denies any injury or trauma to his neck or back. His back pain is chronic in nature and also related to his job as a Education administratorpainter. He has taken tramadol and naproxen in the past for his back pain.     Sore throat He has been painting and around a large amount of dust. He feels as if the dust has gotten into his throat and caused "a scratchy sensation". The sensation is worsened by eating or drinking. He denies any fever although his temperature today is slightly elevated.   Review of Systems  Constitutional: Negative for chills, fever, malaise/fatigue and weight loss.  HENT: Positive for sore throat. Negative for congestion, ear pain, nosebleeds and tinnitus.   Eyes: Negative.   Respiratory: Negative.  Negative for cough, sputum production and shortness of breath.   Cardiovascular: Negative.  Negative for chest pain, orthopnea and claudication.  Gastrointestinal: Negative for nausea and vomiting.  Musculoskeletal: Positive for back pain and neck pain.  Neurological: Negative for dizziness, tingling and headaches.    Past Medical History:  Diagnosis Date  . Asthma   . Herpes   . Shingles     History reviewed. No pertinent surgical history.  Family History  Problem Relation Age of Onset  . Cancer Father     Social History Reviewed with no changes to be made today.   Outpatient Medications Prior to Visit  Medication Sig Dispense Refill  . ergocalciferol (VITAMIN D2) 50000 units capsule Take 50,000 Units by mouth once a week.    Marland Kitchen. albuterol (PROVENTIL HFA;VENTOLIN HFA) 108 (90 Base) MCG/ACT inhaler Inhale 2 puffs  into the lungs every 4 (four) hours as needed for wheezing or shortness of breath. 1 Inhaler 1  . budesonide-formoterol (SYMBICORT) 80-4.5 MCG/ACT inhaler Inhale 2 puffs into the lungs 2 (two) times daily. Try to taper to off if able.  If worse breathing worsens, than resume bid again. (Patient not taking: Reported on 03/21/2017) 1 Inhaler 6  . loratadine (CLARITIN) 10 MG tablet Take 1 tablet (10 mg total) by mouth daily. (Patient not taking: Reported on 03/21/2017) 90 tablet 3  . sildenafil (VIAGRA) 50 MG tablet Take 1 tablet (50 mg total) by mouth daily as needed for  erectile dysfunction. Take 1hour prior to sexual activity. (Patient not taking: Reported on 03/21/2017) 10 tablet 0  . triamcinolone cream (KENALOG) 0.1 % Apply 1 application topically 2 (two) times daily. (Patient not taking: Reported on 03/21/2017) 30 g 0   No facility-administered medications prior to visit.     Allergies  Allergen Reactions  . Penicillins   . Amoxicillin Itching    Pruritic rash      Objective:    BP 113/68 (BP Location: Left Arm, Patient Position: Sitting, Cuff Size: Normal)   Pulse 76   Temp 99.5 F (37.5 C) (Oral)   Ht 5\' 6"  (1.676 m)   Wt 195 lb 3.2 oz (88.5 kg)   SpO2 96%   BMI 31.51 kg/m  Wt Readings from Last 3 Encounters:  03/21/17 195 lb 3.2 oz (88.5 kg)  12/20/16 196 lb (88.9 kg)  07/13/16 187 lb (84.8 kg)    Physical Exam  Constitutional: He is oriented to person, place, and time. He appears well-developed and well-nourished.  HENT:  Head: Normocephalic and atraumatic.  Right Ear: Hearing normal.  Left Ear: Hearing normal.  Mouth/Throat: Uvula is midline and mucous membranes are normal. Posterior oropharyngeal erythema present. No oropharyngeal exudate, posterior oropharyngeal edema or tonsillar abscesses.  Eyes: EOM are normal.  Neck: Phonation normal. Neck supple. No edema present. No thyroid mass and no thyromegaly present.  Neck pain elicited with passive flexion, hyperextension and twisting of neck.   Cardiovascular: Normal rate, regular rhythm and normal heart sounds. Exam reveals no gallop and no friction rub.  No murmur heard. Pulmonary/Chest: Effort normal and breath sounds normal. No respiratory distress.  Abdominal: Soft. Bowel sounds are normal.  Lymphadenopathy:    He has no cervical adenopathy.  Neurological: He is alert and oriented to person, place, and time.  Skin: Skin is warm and dry.  Psychiatric: He has a normal mood and affect. His speech is normal and behavior is normal. Judgment and thought content normal. Cognition  and memory are normal.      Patient has been counseled extensively about nutrition and exercise as well as the importance of adherence with medications and regular follow-up. The patient was given clear instructions to go to ER or return to medical center if symptoms don't improve, worsen or new problems develop. The patient verbalized understanding.   Follow-up: Return in about 2 weeks (around 04/04/2017) for sore throat/neck pain.   Claiborne Rigg, FNP-BC [facility] [place], [place] [phone]   03/22/2017, 7:27 PM

## 2017-03-22 ENCOUNTER — Encounter: Payer: Self-pay | Admitting: Nurse Practitioner

## 2017-03-23 ENCOUNTER — Ambulatory Visit (HOSPITAL_COMMUNITY)
Admission: EM | Admit: 2017-03-23 | Discharge: 2017-03-23 | Disposition: A | Discharge status: Home / Self Care | Payer: Self-pay | Attending: Family Medicine | Admitting: Family Medicine

## 2017-03-23 DIAGNOSIS — S46819A Strain of other muscles, fascia and tendons at shoulder and upper arm level, unspecified arm, initial encounter: Secondary | ICD-10-CM

## 2017-03-23 DIAGNOSIS — S161XXA Strain of muscle, fascia and tendon at neck level, initial encounter: Secondary | ICD-10-CM

## 2017-03-23 MED ORDER — PREDNISONE 10 MG (21) PO TBPK: ORAL_TABLET | Freq: Every day | ORAL | 0 refills | Status: DC

## 2017-03-23 MED ORDER — METHOCARBAMOL 500 MG PO TABS: 500.0000 mg | ORAL_TABLET | Freq: Four times a day (QID) | ORAL | 0 refills | Status: DC | PRN

## 2017-03-23 NOTE — ED Triage Notes (Signed)
Pt here for upper back pain x 10 days

## 2017-03-23 NOTE — ED Provider Notes (Signed)
[facility]    CSN: [id number] Arrival date & time: 03/23/17  1950     History   Chief Complaint Chief Complaint  Patient presents with  . Back Pain    [name] is a 47 y.o. male.   47 year old male, with history of asthma, presenting today complaining of neck and shoulder pain.  Symptoms have been ongoing for the past 10 days.  He denies any known injury.  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  Has been having neck pain that radiates into his shoulders bilaterally as well as trapezius pain.  States that his pain radiates up his neck and into the base of his skull.  He states that he has not been sleeping secondary to the pain.  Has a prescription for meloxicam that he has been taking without much relief.  Patient is concerned that his symptoms may be related to his heart.  He denies any chest pain, shortness of breath, diaphoresis.   The history is provided by the patient.  Back Pain  Pain location: cervical spine. Quality:  Aching Radiates to:  R shoulder and L shoulder Pain severity:  Moderate Pain is:  Same all the time Onset quality:  Gradual Duration:  10 days Timing:  Constant Progression:  Unchanged Chronicity:  New Context: not emotional stress, not falling, not lifting heavy objects, not MCA, not MVA, not occupational injury and not pedestrian accident   Relieved by:  Nothing Worsened by:  Nothing Ineffective treatments:  Bed rest and NSAIDs Associated symptoms: no abdominal pain, no abdominal swelling, no bladder incontinence, no bowel incontinence, no chest pain, no dysuria, no fever, no headaches, no leg pain, no numbness, no paresthesias, no pelvic pain, no perianal numbness, no tingling, no weakness and no weight loss   Risk factors: no hx of cancer, no hx of osteoporosis, no lack of exercise, no menopause, not obese and not pregnant     Past Medical History:  Diagnosis Date  . Asthma   . Herpes   . Shingles      Patient Active Problem List   Diagnosis Date Noted  . Asthma due to environmental allergies 12/20/2016  . HSV (herpes simplex virus) infection 02/24/2015  . Anxiety 04/02/2014    No past surgical history on file.     Home Medications    Prior to Admission medications   Medication Sig Start Date End Date Taking? Authorizing Provider  albuterol (PROVENTIL HFA;VENTOLIN HFA) 108 (90 Base) MCG/ACT inhaler Inhale 2 puffs into the lungs every 4 (four) hours as needed for wheezing or shortness of breath. 12/20/16 01/19/17  Claiborne Rigg, NP  budesonide-formoterol (SYMBICORT) 80-4.5 MCG/ACT inhaler Inhale 2 puffs into the lungs 2 (two) times daily. Try to taper to off if able.  If worse breathing worsens, than resume bid again. Patient not taking: Reported on 03/21/2017 12/20/16   Claiborne Rigg, NP  ergocalciferol (VITAMIN D2) 50000 units capsule Take 50,000 Units by mouth once a week.    [provider]  lidocaine (XYLOCAINE) 2 % solution Use as directed 15 mLs in the mouth or throat as needed for mouth pain. 03/21/17   Claiborne Rigg, NP  loratadine (CLARITIN) 10 MG tablet Take 1 tablet (10 mg total) by mouth daily. Patient not taking: Reported on 03/21/2017 12/20/16   Claiborne Rigg, NP  meloxicam (MOBIC) 15 MG tablet Take 1 tablet (15 mg total) by mouth daily. 03/21/17  Claiborne Rigg, NP  methocarbamol (ROBAXIN) 500 MG tablet Take 1 tablet (500 mg total) by mouth every 6 (six) hours as needed for muscle spasms. 03/23/17   Breyton Vanscyoc C, PA-C  predniSONE (STERAPRED UNI-PAK 21 TAB) 10 MG (21) TBPK tablet Take by mouth daily. Take 6 tabs by mouth daily  for 2 days, then 5 tabs for 2 days, then 4 tabs for 2 days, then 3 tabs for 2 days, 2 tabs for 2 days, then 1 tab by mouth daily for 2 days 03/23/17   Kooper Godshall C, PA-C  sildenafil (VIAGRA) 50 MG tablet Take 1 tablet (50 mg total) by mouth daily as needed for erectile dysfunction. Take 1hour prior to sexual activity. Patient not  taking: Reported on 03/21/2017 12/20/16   Claiborne Rigg, NP  tiZANidine (ZANAFLEX) 4 MG capsule Take 1 capsule (4 mg total) by mouth 3 (three) times daily as needed for muscle spasms. 03/21/17   Claiborne Rigg, NP  clonazePAM (KLONOPIN) 1 MG tablet Take 1 tablet (1 mg total) by mouth 2 (two) times daily. Patient not taking: Reported on 08/13/2014 03/13/14 11/23/14  Linna Hoff, MD  traZODone (DESYREL) 50 MG tablet Take 2 tablets (100 mg total) by mouth at bedtime. Patient not taking: Reported on 08/13/2014 03/15/14 11/23/14  Ambrose Finland, NP    Family History Family History  Problem Relation Age of Onset  . Cancer Father     Social History Social History   Tobacco Use  . Smoking status: Former Games developer  . Smokeless tobacco: Never Used  Substance Use Topics  . Alcohol use: No  . Drug use: No     Allergies   Penicillins and Amoxicillin   Review of Systems Review of Systems  Constitutional: Negative for chills, fever and weight loss.  HENT: Negative for ear pain and sore throat.   Eyes: Negative for pain and visual disturbance.  Respiratory: Negative for cough and shortness of breath.   Cardiovascular: Negative for chest pain and palpitations.  Gastrointestinal: Negative for abdominal pain, bowel incontinence and vomiting.  Genitourinary: Negative for bladder incontinence, dysuria, hematuria and pelvic pain.  Musculoskeletal: Positive for back pain. Negative for arthralgias.  Skin: Negative for color change and rash.  Neurological: Negative for tingling, seizures, syncope, weakness, numbness, headaches and paresthesias.  All other systems reviewed and are negative.    Physical Exam Triage Vital Signs ED Triage Vitals  Enc Vitals Group     BP 03/23/17 1958 113/73     Pulse Rate 03/23/17 1958 78     Resp 03/23/17 1958 18     Temp 03/23/17 1958 98.4 F (36.9 C)     Temp Source 03/23/17 1958 Oral     SpO2 03/23/17 1958 97 %     Weight --      Height --      Head  Circumference --      Peak Flow --      Pain Score 03/23/17 1959 8     Pain Loc --      Pain Edu? --      Excl. in GC? --    No data found.  Updated Vital Signs BP 113/73 (BP Location: Right Arm)   Pulse 78   Temp 98.4 F (36.9 C) (Oral)   Resp 18   SpO2 97%   Visual Acuity Right Eye Distance:   Left Eye Distance:   Bilateral Distance:    Right Eye Near:   Left Eye Near:  Bilateral Near:     Physical Exam  Constitutional: He appears well-developed and well-nourished.  HENT:  Head: Normocephalic and atraumatic.  Eyes: Conjunctivae are normal.  Neck: Neck supple.  Cardiovascular: Normal rate and regular rhythm.  No murmur heard. Pulmonary/Chest: Effort normal and breath sounds normal. No respiratory distress.  Abdominal: Soft. There is no tenderness.  Musculoskeletal: He exhibits no edema.       Cervical back: He exhibits tenderness.       Back:  Reproducible tenderness over the traps bilaterally No midline pain   Neurological: He is alert. He has normal strength and normal reflexes. He is not disoriented. No cranial nerve deficit or sensory deficit. He displays a negative Romberg sign. GCS eye subscore is 4. GCS verbal subscore is 5. GCS motor subscore is 6.  Skin: Skin is warm and dry.  Psychiatric: He has a normal mood and affect.  Nursing note and vitals reviewed.    UC Treatments / Results  Labs (all labs ordered are listed, but only abnormal results are displayed) Labs Reviewed - No data to display  EKG  EKG Interpretation None       Radiology No results found.  Procedures Procedures (including critical care time)  Medications Ordered in UC Medications - No data to display   Initial Impression / Assessment and Plan / UC Course  I have reviewed the triage vital signs and the nursing notes.  Pertinent labs & imaging results that were available during my care of the patient were reviewed by me and considered in my medical decision making  (see chart for details).     EKG showed NSR without abnormalities  Symptoms, exam and history consistent with musculoskeletal neck and trapezius strain.  Will treat symptomatically with prednisone and Robaxin.  Final Clinical Impressions(s) / UC Diagnoses   Final diagnoses:  Strain of neck muscle, initial encounter  Strain of trapezius muscle, unspecified laterality, initial encounter    ED Discharge Orders        Ordered    methocarbamol (ROBAXIN) 500 MG tablet  Every 6 hours PRN     03/23/17 2013    predniSONE (STERAPRED UNI-PAK 21 TAB) 10 MG (21) TBPK tablet  Daily     03/23/17 2013       Controlled Substance Prescriptions [place] Controlled Substance Registry consulted? Not Applicable   Alecia LemmingBlue, Nevelyn Mellott C, [place]PA-C 03/23/17 2016

## 2017-04-03 ENCOUNTER — Encounter (HOSPITAL_COMMUNITY): Payer: Self-pay | Admitting: Emergency Medicine

## 2017-04-03 ENCOUNTER — Ambulatory Visit (HOSPITAL_COMMUNITY)
Admission: EM | Admit: 2017-04-03 | Discharge: 2017-04-03 | Disposition: A | Discharge status: Home / Self Care | Payer: Self-pay | Attending: Urgent Care | Admitting: Urgent Care

## 2017-04-03 ENCOUNTER — Ambulatory Visit (INDEPENDENT_AMBULATORY_CARE_PROVIDER_SITE_OTHER): Payer: Self-pay

## 2017-04-03 DIAGNOSIS — M62830 Muscle spasm of back: Secondary | ICD-10-CM

## 2017-04-03 DIAGNOSIS — M542 Cervicalgia: Secondary | ICD-10-CM

## 2017-04-03 DIAGNOSIS — M546 Pain in thoracic spine: Secondary | ICD-10-CM

## 2017-04-03 DIAGNOSIS — M436 Torticollis: Secondary | ICD-10-CM

## 2017-04-03 MED ORDER — CYCLOBENZAPRINE HCL 7.5 MG PO TABS: 7.5000 mg | ORAL_TABLET | Freq: Three times a day (TID) | ORAL | 0 refills | Status: DC | PRN

## 2017-04-03 MED ORDER — MELOXICAM 7.5 MG PO TABS: 7.5000 mg | ORAL_TABLET | Freq: Every evening | ORAL | 1 refills | Status: DC | PRN

## 2017-04-03 NOTE — ED Provider Notes (Signed)
  MRN: [id number] DOB: 1971/01/13  Subjective:   Gabriel Ball is a 47 y.o. male presenting for 4 week history of persistent neck pain extending into both trapezius muscles. Denies trauma, falls, weakness, radicular pain, numbness or tingling, headaches, dizziness. Has been seen before for this, tried prednisone and Robaxin. But he did not like either medication. 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   Gabriel Ball is not currently taking any medications and is allergic to penicillins and amoxicillin.  Gabriel Ball  has a past medical history of Asthma, Herpes, and Shingles. Denies past surgical history.  Objective:   Vitals: BP 107/62   Pulse 66   Temp 98.4 F (36.9 C)   Resp 18   SpO2 99%   Physical Exam  Constitutional: He is oriented to person, place, and time. He appears well-developed and well-nourished.  Eyes: EOM are normal. Pupils are equal, round, and reactive to light.  Cardiovascular: Normal rate.  Pulmonary/Chest: Effort normal.  Musculoskeletal:       Cervical back: He exhibits tenderness (during ROM testing) and spasm (over both trapezius). He exhibits normal range of motion, no bony tenderness, no swelling, no edema and no deformity.  Neurological: He is alert and oriented to person, place, and time. He displays normal reflexes. Coordination normal.  Skin: Skin is warm and dry.  Psychiatric: He has a normal mood and affect.   Dg Cervical Spine Complete  Result Date: 04/03/2017 CLINICAL DATA:  47 y/o M; Upper back pain in C7/left scapular region. EXAM: CERVICAL SPINE - COMPLETE 4+ VIEW COMPARISON:  None. FINDINGS: There is no evidence of cervical spine fracture or prevertebral soft tissue swelling. Alignment is normal. No other significant bone abnormalities are identified. IMPRESSION: Negative cervical spine radiographs. Electronically Signed   By: Mitzi HansenLance  Furusawa-Stratton M.D.   On: 04/03/2017 17:21   Assessment and Plan :    Neck pain  Muscle spasm of back  Neck stiffness  Recommended conservative management. Return-to-clinic precautions discussed, patient verbalized understanding.    Wallis BambergMani, Shelley Cocke, [place]PA-C 04/03/17 2129

## 2017-04-03 NOTE — Discharge Instructions (Signed)
Toma 1 galon (2 litros) de agua al dia. [place] meloxicam para dolor y Flexeril para Surveyor, mining los musculos.

## 2017-04-03 NOTE — ED Triage Notes (Signed)
With spanish interpreter, "I was here two weeks ago because my back hurts, my upper back, and it is the same problem.". Pt was given medicines for it. Given prednisone and muscle relaxer. Pt states "I took it but it gives me pain in my stomach when I take it".

## 2017-04-07 ENCOUNTER — Emergency Department (HOSPITAL_COMMUNITY)
Admission: EM | Admit: 2017-04-07 | Discharge: 2017-04-08 | Disposition: A | Discharge status: Home / Self Care | Payer: Self-pay | Attending: Emergency Medicine | Admitting: Emergency Medicine

## 2017-04-07 ENCOUNTER — Encounter (HOSPITAL_COMMUNITY): Payer: Self-pay | Admitting: *Deleted

## 2017-04-07 ENCOUNTER — Emergency Department (HOSPITAL_COMMUNITY): Payer: Self-pay

## 2017-04-07 ENCOUNTER — Ambulatory Visit (HOSPITAL_COMMUNITY)
Admission: EM | Admit: 2017-04-07 | Discharge: 2017-04-07 | Disposition: A | Discharge status: Home / Self Care | Payer: Self-pay | Attending: Family Medicine | Admitting: Family Medicine

## 2017-04-07 ENCOUNTER — Encounter (HOSPITAL_COMMUNITY): Payer: Self-pay | Admitting: Family Medicine

## 2017-04-07 DIAGNOSIS — R200 Anesthesia of skin: Secondary | ICD-10-CM | POA: Insufficient documentation

## 2017-04-07 DIAGNOSIS — R42 Dizziness and giddiness: Secondary | ICD-10-CM | POA: Insufficient documentation

## 2017-04-07 DIAGNOSIS — Z87891 Personal history of nicotine dependence: Secondary | ICD-10-CM | POA: Insufficient documentation

## 2017-04-07 DIAGNOSIS — R202 Paresthesia of skin: Secondary | ICD-10-CM

## 2017-04-07 DIAGNOSIS — J45909 Unspecified asthma, uncomplicated: Secondary | ICD-10-CM | POA: Insufficient documentation

## 2017-04-07 LAB — COMPREHENSIVE METABOLIC PANEL
ALBUMIN: 4.4 g/dL (ref 3.5–5.0)
ALT: 29 U/L (ref 17–63)
AST: 29 U/L (ref 15–41)
Alkaline Phosphatase: 79 U/L (ref 38–126)
Anion gap: 10 (ref 5–15)
BUN: 7 mg/dL (ref 6–20)
CO2: 23 mmol/L (ref 22–32)
Calcium: 9.6 mg/dL (ref 8.9–10.3)
Chloride: 104 mmol/L (ref 101–111)
Creatinine, Ser: 0.67 mg/dL (ref 0.61–1.24)
GFR calc non Af Amer: >60 mL/min (ref >60)
GLUCOSE: 98 mg/dL (ref 65–99)
POTASSIUM: 3.6 mmol/L (ref 3.5–5.1)
SODIUM: 137 mmol/L (ref 135–145)
Total Bilirubin: 0.5 mg/dL (ref 0.3–1.2)
Total Protein: 7.3 g/dL (ref 6.5–8.1)

## 2017-04-07 LAB — I-STAT TROPONIN, ED: Troponin i, poc: 0 ng/mL (ref 0.00–0.08)

## 2017-04-07 LAB — I-STAT CHEM 8, ED
BUN: 7 mg/dL (ref 6–20)
CALCIUM ION: 1.25 mmol/L (ref 1.15–1.40)
CHLORIDE: 103 mmol/L (ref 101–111)
Creatinine, Ser: 0.6 mg/dL — ABNORMAL LOW (ref 0.61–1.24)
Glucose, Bld: 93 mg/dL (ref 65–99)
HEMATOCRIT: 44 % (ref 39.0–52.0)
Hemoglobin: 15 g/dL (ref 13.0–17.0)
Potassium: 3.8 mmol/L (ref 3.5–5.1)
SODIUM: 141 mmol/L (ref 135–145)
TCO2: 25 mmol/L (ref 22–32)

## 2017-04-07 LAB — CBC
HCT: 44 % (ref 39.0–52.0)
Hemoglobin: 15.4 g/dL (ref 13.0–17.0)
MCH: 29.2 pg (ref 26.0–34.0)
MCHC: 35 g/dL (ref 30.0–36.0)
MCV: 83.5 fL (ref 78.0–100.0)
PLATELETS: 240 10*3/uL (ref 150–400)
RBC: 5.27 MIL/uL (ref 4.22–5.81)
RDW: 13.1 % (ref 11.5–15.5)
WBC: 9.6 10*3/uL (ref 4.0–10.5)

## 2017-04-07 LAB — DIFFERENTIAL
BASOS PCT: 0 %
Basophils Absolute: 0 10*3/uL (ref 0.0–0.1)
EOS ABS: 0.1 10*3/uL (ref 0.0–0.7)
Eosinophils Relative: 1 %
LYMPHS PCT: 16 %
Lymphs Abs: 1.6 10*3/uL (ref 0.7–4.0)
MONO ABS: 0.3 10*3/uL (ref 0.1–1.0)
Monocytes Relative: 4 %
NEUTROS PCT: 79 %
Neutro Abs: 7.6 10*3/uL (ref 1.7–7.7)

## 2017-04-07 LAB — PROTIME-INR
INR: 1.02
Prothrombin Time: 13.3 seconds (ref 11.4–15.2)

## 2017-04-07 LAB — APTT: aPTT: 35 seconds (ref 24–36)

## 2017-04-07 NOTE — ED Provider Notes (Signed)
[facility] EMERGENCY DEPARTMENT Provider Note   CSN: [id number] Arrival date & time: 04/07/17  1615     History   Chief Complaint Chief Complaint  Patient presents with  . Weakness  . Numbness    [name] is a 47 y.o. male.  The history is provided by the patient. A language interpreter was used.  He has history of asthma and noted this morning numbness in the right side of his face and the right arm.  He also noticed that he was dizzy.  He thinks symptoms may have been due to medication he was given for back pain.  He states symptoms were not present when he first woke up.  He went to urgent care and was sent here for further evaluation.  He denies any weakness.  He denies headache.  He denies visual change.  He has not had similar symptoms in the past.  Numbness is improving, but is still present.  Past Medical History:  Diagnosis Date  . Asthma   . Herpes   . Shingles     Patient Active Problem List   Diagnosis Date Noted  . Asthma due to environmental allergies 12/20/2016  . HSV (herpes simplex virus) infection 02/24/2015  . Anxiety 04/02/2014    History reviewed. No pertinent surgical history.     Home Medications    Prior to Admission medications   Medication Sig Start Date End Date Taking? Authorizing Provider  cyclobenzaprine (FEXMID) 7.5 MG tablet Take 1 tablet (7.5 mg total) by mouth 3 (three) times daily as needed for muscle spasms. 04/03/17  Yes Wallis Bamberg, PA-C  meloxicam (MOBIC) 7.5 MG tablet Take 1 tablet (7.5 mg total) by mouth at bedtime as needed for pain. 04/03/17  Yes Wallis Bamberg, PA-C    Family History Family History  Problem Relation Age of Onset  . Cancer Father     Social History Social History   Tobacco Use  . Smoking status: Former Games developer  . Smokeless tobacco: Never Used  Substance Use Topics  . Alcohol use: No  . Drug use: No     Allergies   Amoxicillin and Penicillins   Review of  Systems Review of Systems  All other systems reviewed and are negative.    Physical Exam Updated Vital Signs BP 106/74 (BP Location: Right Arm)   Pulse 96   Temp 99.2 F (37.3 C) (Oral)   Resp 16   SpO2 100%   Physical Exam  Nursing note and vitals reviewed.  47 year old male, resting comfortably and in no acute distress. Vital signs are normal. Oxygen saturation is 100%, which is normal. Head is normocephalic and atraumatic. PERRLA, EOMI. Oropharynx is clear. Neck is nontender and supple without adenopathy or JVD.  There are no carotid bruits. Back is nontender and there is no CVA tenderness. Lungs are clear without rales, wheezes, or rhonchi. Chest is nontender. Heart has regular rate and rhythm without murmur. Abdomen is soft, flat, nontender without masses or hepatosplenomegaly and peristalsis is normoactive. Extremities have no cyanosis or edema, full range of motion is present. Skin is warm and dry without rash. Neurologic: Mental status is normal, cranial nerves are intact, there are no motor or sensory deficits.  There is no pronator drift.  There is no extinction on double simultaneous stimulation.  ED Treatments / Results  Labs (all labs ordered are listed, but only abnormal results are displayed) Labs Reviewed  I-STAT CHEM 8, ED - Abnormal; Notable for  the following components:      Result Value   Creatinine, Ser 0.60 (*)    All other components within normal limits  PROTIME-INR  APTT  CBC  DIFFERENTIAL  COMPREHENSIVE METABOLIC PANEL  I-STAT TROPONIN, ED    EKG  EKG Interpretation  Date/Time:  Thursday April 07 2017 17:38:21 EDT Ventricular Rate:  105 PR Interval:  142 QRS Duration: 90 QT Interval:  338 QTC Calculation: 446 R Axis:   31 Text Interpretation:  Sinus tachycardia Otherwise normal ECG When compared with ECG of 03/23/2017, No significant change was found Confirmed by Dione BoozeGlick, Meron Bocchino ([id number]) on 04/07/2017 10:59:22 PM        Radiology Ct  Head Wo Contrast  Result Date: 04/07/2017 CLINICAL DATA:  RIGHT-sided numbness began earlier today. No reported neurologic deficit. EXAM: CT HEAD WITHOUT CONTRAST TECHNIQUE: Contiguous axial images were obtained from the base of the skull through the vertex without intravenous contrast. COMPARISON:  CT head 02/27/2014. FINDINGS: Brain: No evidence of acute infarction, hemorrhage, hydrocephalus, extra-axial collection or mass lesion/mass effect. Vascular: No hyperdense vessel or unexpected calcification. Skull: Normal. Negative for fracture or focal lesion. Sinuses/Orbits: No acute finding. Other: None. IMPRESSION: Negative exam.  No change from priors. Electronically Signed   By: Elsie StainJohn T Curnes M.D.   On: 04/07/2017 18:47    Procedures Procedures   Medications Ordered in ED Medications - No data to display   Initial Impression / Assessment and Plan / ED Course  I have reviewed the triage vital signs and the nursing notes.  Pertinent labs & imaging results that were available during my care of the patient were reviewed by me and considered in my medical decision making (see chart for details).  Right-sided numbness concerning for possible stroke.  Old records are reviewed confirming urgent care visit earlier today, and urgent care visit 4 days ago for neck pain at which time he was prescribed cyclobenzaprine.  Laboratory workup is unremarkable, and CT of head is normal.  He will be sent for MRI to rule out stroke.  He was unable to tolerate MRI scan, and is refusing medications to help him get the scan done.  His symptoms have resolved and exam shows no evidence of stroke.  It certainly is possible this is related to his cyclobenzaprine.  He is advised to discontinue taking cyclobenzaprine, return if symptoms seem to be worsening.  Final Clinical Impressions(s) / ED Diagnoses   Final diagnoses:  Numbness    ED Discharge Orders    None       Dione BoozeGlick, Nevaya Nagele, MD 04/08/17 [phone]

## 2017-04-07 NOTE — ED Triage Notes (Signed)
Interpreter [id number] used. [facility] since waking up this am at 0530. Sent to ED via POV for further eval. Per interpreter [facility] and prescribed Meloxican and Flexeril. Pt small HA. Strength in arms and legs strong and equal. Alert and oriented

## 2017-04-07 NOTE — ED Provider Notes (Signed)
[facility]    CSN: [id number] Arrival date & time: 04/07/17  1432     History   Chief Complaint Chief Complaint  Patient presents with  . Back Pain    [name] is a 47 y.o. male.   47 year old male, presenting today due to right-sided facial numbness as well as right arm numbness that started last night. Symptoms constant since the onset  Patient has been evaluated several times in urgent care for neck and back pain.  Has been placed on various medications including prednisone and anti-inflammatories.  Patient states that last night, he developed right-sided facial numbness as well as right arm numbness.  Is complaining of some mild weakness in the distal right arm. Patient complaining of mild headache He denies any dizziness, lightheadedness, change in vision, slurred speech or facial droop  The history is provided by the patient.  Extremity Weakness  This is a new problem. The current episode started yesterday. The problem occurs constantly. The problem has not changed since onset.Associated symptoms include headaches. Pertinent negatives include no chest pain, no abdominal pain and no shortness of breath. Nothing aggravates the symptoms. Nothing relieves the symptoms. He has tried nothing for the symptoms. The treatment provided no relief.    Past Medical History:  Diagnosis Date  . Asthma   . Herpes   . Shingles     Patient Active Problem List   Diagnosis Date Noted  . Asthma due to environmental allergies 12/20/2016  . HSV (herpes simplex virus) infection 02/24/2015  . Anxiety 04/02/2014    History reviewed. No pertinent surgical history.     Home Medications    Prior to Admission medications   Medication Sig Start Date End Date Taking? Authorizing Provider  cyclobenzaprine (FEXMID) 7.5 MG tablet Take 1 tablet (7.5 mg total) by mouth 3 (three) times daily as needed for muscle spasms. 04/03/17   Wallis Bamberg, PA-C  meloxicam (MOBIC) 7.5 MG  tablet Take 1 tablet (7.5 mg total) by mouth at bedtime as needed for pain. 04/03/17   Wallis Bamberg, PA-C    Family History Family History  Problem Relation Age of Onset  . Cancer Father     Social History Social History   Tobacco Use  . Smoking status: Former Games developer  . Smokeless tobacco: Never Used  Substance Use Topics  . Alcohol use: No  . Drug use: No     Allergies   Penicillins and Amoxicillin   Review of Systems Review of Systems  Constitutional: Negative for chills and fever.  HENT: Negative for ear pain and sore throat.   Eyes: Negative for pain and visual disturbance.  Respiratory: Negative for cough and shortness of breath.   Cardiovascular: Negative for chest pain and palpitations.  Gastrointestinal: Negative for abdominal pain and vomiting.  Genitourinary: Negative for dysuria and hematuria.  Musculoskeletal: Positive for extremity weakness. Negative for arthralgias and back pain.  Skin: Negative for color change and rash.  Neurological: Positive for numbness (right face and right arm ) and headaches. Negative for seizures and syncope.  All other systems reviewed and are negative.    Physical Exam Triage Vital Signs ED Triage Vitals [04/07/17 1524]  Enc Vitals Group     BP 124/77     Pulse Rate (!) 104     Resp 18     Temp 98.5 F (36.9 C)     Temp src      SpO2 100 %     Weight  Height      Head Circumference      Peak Flow      Pain Score      Pain Loc      Pain Edu?      Excl. in GC?    No data found.  Updated Vital Signs BP 124/77   Pulse (!) 104   Temp 98.5 F (36.9 C)   Resp 18   SpO2 100%   Visual Acuity Right Eye Distance:   Left Eye Distance:   Bilateral Distance:    Right Eye Near:   Left Eye Near:    Bilateral Near:     Physical Exam  Constitutional: He is oriented to person, place, and time. He appears well-developed and well-nourished.  HENT:  Head: Normocephalic and atraumatic.  Eyes: Conjunctivae are  normal.  Neck: Neck supple.  Cardiovascular: Normal rate and regular rhythm.  No murmur heard. Pulmonary/Chest: Effort normal and breath sounds normal. No respiratory distress.  Abdominal: Soft. There is no tenderness.  Musculoskeletal: He exhibits no edema.       Lumbar back: He exhibits tenderness.  Neurological: He is alert and oriented to person, place, and time. He has normal strength and normal reflexes. He displays no tremor. No cranial nerve deficit or sensory deficit. He exhibits normal muscle tone. He displays a negative Romberg sign. He displays no seizure activity. GCS eye subscore is 4. GCS verbal subscore is 5. GCS motor subscore is 6.  No facial droop noted No slurred speech Decreased sensation in the right face and right arm. Normal strength and sensation in the upper extremities bilaterally Normal strength and sensation in the lower extremities bilaterally  Skin: Skin is warm and dry.  Psychiatric: He has a normal mood and affect.  Nursing note and vitals reviewed.    UC Treatments / Results  Labs (all labs ordered are listed, but only abnormal results are displayed) Labs Reviewed - No data to display  EKG  EKG Interpretation None       Radiology No results found.  Procedures Procedures (including critical care time)  Medications Ordered in UC Medications - No data to display   Initial Impression / Assessment and Plan / UC Course  I have reviewed the triage vital signs and the nursing notes.  Pertinent labs & imaging results that were available during my care of the patient were reviewed by me and considered in my medical decision making (see chart for details).     Patient presenting today to urgent care for right facial numbness as well as right arm numbness that started last night.  Symptoms have been persistent since the onset.  No facial droop or slurred speech noted.  Patient sent to the emergency department due to description of symptoms to  rule out possible CVA.  Final Clinical Impressions(s) / UC Diagnoses   Final diagnoses:  Right facial numbness  Right arm numbness    ED Discharge Orders    None       Controlled Substance Prescriptions [name] Controlled Substance Registry consulted? Not Applicable   Alecia LemmingBlue, Siani Utke C, [place] JerseyPA-C 04/07/17 1606

## 2017-04-07 NOTE — ED Triage Notes (Addendum)
Pt here for back pain and right sided numbness. Reports that he was here Sunday and prescribed medication and since not feeling well. sts that he is having right facial numbness and right arm numbness and went to work today and didn't feel right. No facial droop noted, equal grip strength. No arm drift.

## 2017-04-08 ENCOUNTER — Emergency Department (HOSPITAL_COMMUNITY): Payer: Self-pay

## 2017-04-08 NOTE — ED Notes (Signed)
  Spoke with patient using interpreter service and patient stated he feels back to normal.  Has no complaints of pain and has been resting comfortably waiting for MRI.

## 2017-04-08 NOTE — ED Notes (Signed)
Patient transported to MRI 

## 2017-04-08 NOTE — Discharge Instructions (Addendum)
Su entumecimiento puede haber sido por tomar cyclobenzaprine (Flexeril). Deja de tomar esa medicacin.  Regrese si sus sntomas empeoran.

## 2017-04-15 ENCOUNTER — Ambulatory Visit: Payer: Self-pay | Attending: Nurse Practitioner | Admitting: Nurse Practitioner

## 2017-04-15 ENCOUNTER — Encounter: Payer: Self-pay | Admitting: Nurse Practitioner

## 2017-04-15 VITALS — BP 109/69 | HR 72 | Temp 98.4°F | Resp 16 | Ht 66.0 in | Wt 194.6 lb

## 2017-04-15 DIAGNOSIS — F419 Anxiety disorder, unspecified: Secondary | ICD-10-CM | POA: Insufficient documentation

## 2017-04-15 DIAGNOSIS — F329 Major depressive disorder, single episode, unspecified: Secondary | ICD-10-CM | POA: Insufficient documentation

## 2017-04-15 DIAGNOSIS — J45909 Unspecified asthma, uncomplicated: Secondary | ICD-10-CM | POA: Insufficient documentation

## 2017-04-15 DIAGNOSIS — M792 Neuralgia and neuritis, unspecified: Secondary | ICD-10-CM | POA: Insufficient documentation

## 2017-04-15 DIAGNOSIS — Z8619 Personal history of other infectious and parasitic diseases: Secondary | ICD-10-CM | POA: Insufficient documentation

## 2017-04-15 DIAGNOSIS — M542 Cervicalgia: Secondary | ICD-10-CM | POA: Insufficient documentation

## 2017-04-15 MED ORDER — ACYCLOVIR 400 MG PO TABS: 400.0000 mg | ORAL_TABLET | Freq: Three times a day (TID) | ORAL | 0 refills | Status: AC

## 2017-04-15 MED ORDER — HYDROXYZINE HCL 25 MG PO TABS: 25.0000 mg | ORAL_TABLET | Freq: Three times a day (TID) | ORAL | 1 refills | Status: AC | PRN

## 2017-04-15 NOTE — Patient Instructions (Addendum)
Trastorno de ansiedad generalizada, en adultos  Generalized Anxiety Disorder, Adult  El trastorno de ansiedad generalizada (TAG) es un trastorno de salud mental. Las personas con esta afeccin se preocupan constantemente por los eventos de todos los das. A diferencia de la ansiedad normal, la preocupacin relacionada con el TAG no se produce por un evento especfico. Estas preocupaciones tampoco desaparecen ni mejoran con el tiempo. EL TAG interfiere con las funciones de la vida, incluidas las relaciones, el trabajo y la escuela.  El TAG puede variar de leve a grave. Las personas con TAG grave tienen intensas oleadas de ansiedad con sntomas fsicos (crisis de angustia).  Cules son las causas?  Se desconoce la causa exacta del TAG.  Qu incrementa el riesgo?  Es ms probable que esta afeccin se manifieste en:   Mujeres.   Las personas que tienen antecedentes familiares de trastornos de ansiedad.   Las personas que son muy tmidas.   Las personas que experimentan eventos muy estresantes en la vida, tales como la muerte de un ser querido.   Las personas que tienen un entorno familiar muy estresante.    Cules son los signos o los sntomas?  Con frecuencia, las personas que padecen el TAG se preocupan excesivamente por muchas cosas en la vida, tales como su salud y su familia. Tambin pueden experimentar una preocupacin desmedida por lo siguiente:   Hacer las cosas bien.   Llegar a tiempo.   Los desastres naturales.   Las amistades.    Los sntomas fsicos del TAG incluyen:   Fatiga.   Tensin muscular o contracciones musculares.   Temblores o agitacin.   Sobresaltarse con facilidad.   Sentir que el corazn late fuerte o est acelerado.   Sentir falta de aire o como que no se puede respirar profundamente.   Problemas para quedarse dormido o para seguir durmiendo.   Sudoracin.   Nuseas, diarrea o sndrome del intestino irritable (SII).   Dolores de cabeza.   Dificultad para concentrarse o  recordar hechos.   Agitacin.   Irritabilidad.    Cmo se diagnostica?  El mdico puede diagnosticar el TAG en funcin de los sntomas y los antecedentes mdicos. Se le realizar un examen fsico. El mdico le har preguntas especficas sobre sus sntomas, que incluyen qu tan graves son, cundo comenzaron y si van y vienen. Tambin puede hacerle preguntas sobre el consumo de alcohol o drogas, incluidos los medicamentos recetados. Su mdico podr derivarlo a un especialista de salud mental para ms evaluaciones.  Su mdico le realizar un examen exhaustivo y puede hacer pruebas adicionales para descartar otras posibles causas de sus sntomas.  Para recibir un diagnstico del TAG, una persona debe tener ansiedad que:   Est fuera de su control.   Afecte distintos aspectos de su vida, como el trabajo y las relaciones.   Cause angustia que le impida participar en sus actividades habituales.   Incluya al menos tres sntomas fsicos del TAG tales como inquietud, fatiga, dificultad para concentrarse, irritabilidad, tensin muscular o problemas para dormir.    Antes de que su mdico pueda confirmar un diagnstico del TAG, estos sntomas deben estar presentes ms das de los que no lo estn y deben tener una duracin de seis meses o ms.  Cmo se trata?  Las siguientes terapias se usan generalmente para tratar el TAG:   Medicamentos. Por lo general, los medicamentos antidepresivos se recetan para un control diario a largo plazo. Se pueden agregar medicamentos para la ansiedad en casos   para [employer]. Aprenden a identificar conductas y pensamientos  irreales o negativos, y a reemplazarlos por positivos. ? Terapia de aceptacin y compromiso (acceptance and commitment therapy, ACT). Este tratamiento ensea a las personas a ser conscientes como una forma de lidiar con pensamientos y sentimientos no deseados. ? Biorretroalimentacin. Este proceso lo capacita para Scientist, physiological respuesta del cuerpo Publishing copy) a travs de tcnicas de respiracin y mtodos de relajacin. Usted trabajar con un terapeuta mientras se usan mquinas para controlar sus sntomas fsicos.  Tcnicas para Charity fundraiser. Estas incluyen yoga, meditacin y ejercicio.  Un especialista en salud mental puede ayudar a determinar qu tratamiento es el mejor para usted. Algunas Naval architect con solo un tipo de [place]. Sin embargo, Economist requieren una combinacin de terapias. Siga estas instrucciones en su casa:  Tome los medicamentos de venta libre y los recetados solamente como se lo haya indicado el mdico.  Trate de mantener una rutina normal.  Trate de anticipar situaciones estresantes y permita un tiempo adicional para controlarlas.  Participe de cualquier tcnica para autocalmarse o controlar el estrs segn las indicaciones de su mdico.  No se castigue ante los retrocesos o por no Sports coach.  Trate de reconocer sus logros aunque sean pequeos.  Concurra a todas las visitas de [address] se lo haya indicado el mdico. Esto es importante. Comunquese con un mdico si:  Los sntomas no mejoran.  Los sntomas empeoran.  Tiene signos de depresin tales como: ? Tristeza, mal humor o irritabilidad. ? Ya no disfruta de Industrial/product designer. ? Cambios en el peso y o en sus hbitos de alimentacin. ? Cambios en los hbitos de sueo. ? Evita a amigos y familiares. ? No tiene energa para realizar las tareas habituales. ? Tiene sentimientos de culpa o de minusvala. Solicite ayuda de inmediato  si:  Tiene pensamientos serios acerca de lastimarse a usted mismo o daar a Economist. Si alguna vez siente que puede lastimarse o Physicist, medical a los dems, o piensa en poner fin a su vida, busque ayuda de inmediato. Puede dirigirse al servicio de urgencias ms cercano o comunicarse con:  El servicio de Sports administrator de su localidad (911 en los [place]).  Una lnea de asistencia al suicida y Visual merchandiser en crisis, como la [employer] de Prevencin del Suicidio (National Suicide Prevention Lifeline) al [phone]. Est disponible las 24 horas del da.  Resumen  El trastorno de ansiedad generalizada (TAG) es un trastorno de salud mental que implica preocupacin no causada por un evento especfico.  Con frecuencia, las personas que padecen el TAG se preocupan excesivamente por muchas cosas en la vida, tales como su salud y [place].  El TAG puede causar sntomas fsicos tales como inquietud, dificultad para concentrarse, problemas para dormir, sudoracin frecuente, nuseas, diarrea, dolores de [place] y temblores, o contracciones musculares.  Un especialista en salud mental puede ayudar a determinar qu tratamiento es el mejor para usted. Algunas Naval architect con solo un tipo de [place]. Sin embargo, Economist requieren una combinacin de terapias. Esta informacin no tiene Theme park manager el consejo del mdico. Asegrese de hacerle al mdico cualquier pregunta que tenga. Document Released: 05/01/2012 Document Revised: 04/09/2016 Document Reviewed: 04/09/2016 Elsevier Interactive Patient Education  2018 [employer].  Vivir con ansiedad Living With Anxiety Despus de haber sido diagnosticado con trastorno de ansiedad, podra sentirse aliviado por comprender por qu se haba sentido o haba actuado de cierto modo. Adems,  es natural sentirse abrumado por el tratamiento que tiene por delante y por lo que este significar para su vida. Con atencin y [place] trastorno y Sales executive. Cmo hacer frente a la ansiedad Enfrentar el estrs El estrs es la reaccin del cuerpo ante los cambios y los acontecimientos de la vida, tanto buenos [place]. El estrs puede durar solo algunas horas o puede ser [place]. El estrs puede influir mucho en la ansiedad, por lo que es importante aprender sobre cmo hacerle frente y cmo pensarlo de un modo nuevo. Hable con el mdico o un orientador psicolgico para obtener ms informacin sobre cmo Museum/gallery exhibitions officer. Podran sugerirle algunas tcnicas para hacerlo, como:  Musicoterapia. Esto podra incluir crear o escuchar msica que disfrute y lo inspire.  Meditacin consciente. Esto implica prestar atencin a la respiracin normal ms que intentar controlarla. Puede realizarse mientras est sentado o camina.  Oracin centrante. Este es un tipo de meditacin que implica centrarse en una palabra, frase o imagen sagrada que le sea representativa y le genere paz.  Respiracin profunda. Para hacer esto, expanda el estmago e inhale lentamente por la nariz. Mantenga el aire durante un lapso de [place]. Luego, exhale lentamente mientras deja que los msculos del estmago se relajen.  Dilogo interno. Se trata de una habilidad por la que usted es capaz de identificar patrones de pensamiento que lo llevan a Warehouse manager reacciones ansiosas y de corregir dichos pensamientos.  Relajacin muscular. Esto implica tensar los msculos y, [place], [place].  Elija una tcnica para reducir el estrs que se adapte a su estilo de vida y su personalidad. Las tcnicas para reducir el estrs llevan tiempo y Multimedia programmer. Resrvese de 5a7minutos por da para Associate Professor. Algunos terapeutas pueden ofrecerle capacitacin para aprenderlas. Es posible que algunos planes de seguro mdico cubran la capacitacin. Otras cosas que puede hacer para manejar el estrs:  Midwife un registro del estrs. Esto puede ayudarlo a Metallurgist estrs y modos de Chief Operating Officer su reaccin.  Pensar en cmo reacciona ante ciertas situaciones. Es posible que no sea capaz de Chief Operating Officer todo, pero puede controlar su reaccin.  Hacerse tiempo para las actividades que lo ayudan a Lexicographer y no sentir culpa por pasar su tiempo de [place].  La terapia en combinacin con las habilidades para enfrentar y reducir el estrs proporciona la mejor alternativa para un tratamiento satisfactorio. Medicamentos Los medicamentos pueden ayudar a [employer]. Algunos medicamentos para la ansiedad:  Programme researcher, broadcasting/film/video ansiedad.  Antidepresivos.  Betabloqueantes.  Es posible que se requieran medicamentos, junto con la terapia, si otros tratamientos no dieron [place]. Un mdico debe recetar los medicamentos. Las [employer] Corporation relaciones interpersonales pueden ser muy importantes para ayudar a su recuperacin. Intente pasar ms tiempo interactuando con amigos y familiares de [place]. Considere la posibilidad de ir a terapia de pareja, tomar clases de educacin familiar o ir a Information systems manager. La terapia puede ayudarlos a usted y a los dems a comprender mejor el trastorno. Cmo reconocer cambios en el trastorno Todos tienen una respuesta diferente al tratamiento de la ansiedad. Se dice que est recuperado de la ansiedad cuando los sntomas disminuyen y dejan de Producer, television/film/video en las actividades diarias en el hogar o [place]. Esto podra significar que usted comenzar a Radio producer lo siguiente:  Dealer y atencin.  Dormir mejor.  Estar menos irritable.  Tener ms energa.  Tener [employer].  Es Public librarian cundo el trastorno [place]. Comunquese  con el mdico si sus sntomas interfieren en su hogar o su trabajo, y usted no siente que el trastorno est mejorando. Dnde encontrar ayuda y apoyo: Puede conseguir ayuda y M.D.C. Holdingsapoyo en los siguientes lugares:  Grupos de  [place]autoayuda.  Organizaciones comunitarias y en lnea.  Un lder espiritual de confianza.  Terapia de pareja.  Clases de educacin familiar.  Terapia familiar.  Siga estas instrucciones en su casa:  Consuma una dieta saludable que incluya abundantes frutas, verduras, cereales integrales, productos lcteos descremados y protenas magras. No consuma muchos alimentos con alto contenido de grasas slidas, azcares agregados o sal.  Actividad fsica. La Harley-Davidsonmayora de los adultos debe hacer lo siguiente: ? Education officer, environmentalealizar, al [place]menos, 150minutos de actividad fsica por semana. El ejercicio debe aumentar la frecuencia cardaca y Media plannerhacerlo transpirar (ejercicio de intensidad moderada). ? Realizar ejercicios de fortalecimiento por lo Rite Aidmenos dos veces por semana.  Disminuir el consumo de cafena, tabaco, alcohol y otras sustancias potencialmente dainas.  Dormir el tiempo adecuado y de Network engineerla forma correcta. La Harley-Davidsonmayora de los adultos necesitan entre 7y9horas de sueo todas las noches.  Opte por cosas que le simplifiquen la vida.  Tome los medicamentos de venta libre y los recetados solamente como se lo haya indicado el mdico.  Evite el consumo de cafena, alcohol y ciertos medicamentos contra el resfro de venta sin receta. Estos podran Optician, dispensinghacerlo sentir peor. Pregntele al farmacutico qu medicamentos no debera tomar.  Concurra a todas las visitas de control como se lo haya indicado el mdico. Esto es importante. Preguntas para hacerle al mdico  Me ser til la terapia?  Con qu frecuencia debo visitar a un mdico para el seguimiento?  Durante cunto tiempo tendr TXU Corpque tomar los medicamentos?  Tienen efectos secundarios a TRW Automotivelargo plazo los medicamentos que tomo?  Existe una alternativa que remplace los medicamentos? Comunquese con un mdico si:  Le resulta difcil permanecer concentrado o finalizar las tareas diarias.  Pasa muchas horas por da sintindose preocupado por la vida  cotidiana.  La preocupacin le provoca un cansancio extremo.  Comienza a tener dolores de Turkmenistancabeza o nuseas, o a sentirse tenso.  Orina ms de lo normal.  Tiene diarrea. Solicite ayuda de inmediato si:  Se le acelera la frecuencia cardaca y Games developerle cuesta respirar.  Tiene pensamientos acerca de Runner, broadcasting/film/videolastimarse o daar a Economistotras personas. Si alguna vez siente que puede lastimarse o Physicist, medicallastimar a los dems, o tiene pensamientos de poner fin a su vida, busque ayuda de inmediato. Puede dirigirse al servicio de urgencias ms cercano o comunicarse con:  El servicio de Sports administratoremergencias de su localidad (911 en los [place]).  Una lnea de asistencia al suicida y Visual merchandiseratencin en crisis, como la Murphy OilLnea Nacional de Prevencin del Suicidio (National Suicide Prevention Lifeline) al [phone]. Est disponible las 24 horas del da.  Resumen  Tomar medidas para enfrentar el estrs puede calmarlo.  Los medicamentos no pueden curar los trastornos de Watkinsansiedad, Biomedical engineerpero pueden ayudar a Asbury Automotive Groupaliviar los sntomas.  Los familiares, los amigos y las parejas pueden tener un lugar importante en su recuperacin del trastorno de ansiedad. Esta informacin no tiene Theme park managercomo fin reemplazar el consejo del mdico. Asegrese de hacerle al mdico cualquier pregunta que tenga. Document Released: 04/13/2016 Document Revised: 04/13/2016 Document Reviewed: 04/13/2016 Elsevier Interactive Patient Education  2018 [employer]  Radiculopata cervical (Cervical Radiculopathy) El trmino radiculopata cervical significa que un nervio del cuello est comprimido o lesionado. Esto puede causar dolor o la prdida de la sensibilidad (adormecimiento) que se extiende desde  el cuello hasta el brazo y los dedos de la [place]. CUIDADOS EN EL HOGAR Control del [employer] de venta libre y los recetados solamente como se lo haya indicado el mdico.  Si se lo indican, coloque hielo en la zona del dolor o la lesin. ? Ponga el hielo en una  bolsa plstica. ? Coloque una FirstEnergy Corp piel y la bolsa de hielo. ? Coloque el hielo durante , 2 a 3veces por da.  Si el hielo no le Research scientist (life sciences), intente Company secretary. Tome una ducha o un bao tibios, o colquese una compresa de Museum/gallery curator se lo haya indicado el mdico.  Puede intentar darse un masaje suave en el cuello y el hombro. Actividad  Descanse todo lo que sea necesario. Siga las indicaciones del mdico respecto de las actividades que Personnel officer.  Haga ejercicios como se lo hayan indicado el mdico o el fisioterapeuta. Instrucciones generales  Si le indicaron que use un collarn blando, selo como se lo haya indicado el mdico.  Use una almohada plana para dormir.  Concurra a todas las visitas de control como se lo haya indicado el mdico. Esto es importante. SOLICITE AYUDA SI:  La afeccin no mejora con tratamiento. SOLICITE AYUDA DE INMEDIATO SI:  El dolor empeora y no se alivia con los [employer].  Pierde la sensibilidad o siente debilidad en la mano, el brazo, el rostro o la pierna.  Tiene fiebre.  Presenta rigidez en el cuello.  No puede controlar la vejiga o los intestinos (tiene incontinencia).  Tiene dificultad para caminar, mantener el equilibrio o hablar. Esta informacin no tiene Theme park manager el consejo del mdico. Asegrese de hacerle al mdico cualquier pregunta que tenga. Document Released: 12/24/2010 Document Revised: 09/25/2014 Document Reviewed: 02/28/2014 Elsevier Interactive Patient Education  [employer].

## 2017-04-15 NOTE — Progress Notes (Addendum)
Assessment & Plan:  Gabriel Ball was seen today for follow-up.  Diagnoses and all orders for this visit:  Anxiety -     hydrOXYzine (ATARAX/VISTARIL) 25 MG tablet; Take 1 tablet (25 mg total) by mouth 3 (three) times daily as needed for anxiety.  Neuralgia -     acyclovir (ZOVIRAX) 400 MG tablet; Take 1 tablet (400 mg total) by mouth 3 (three) times daily for 10 days.  Cervical pain (neck) -     Ambulatory referral to Physical Therapy  May alternate with heat and ice application for pain relief. May also alternate with acetaminophen and Ibuprofen as prescribed for back pain. Other alternatives include massage, acupuncture and water aerobics.  You must stay active and avoid a sedentary lifestyle.    Patient has been counseled on age-appropriate routine health concerns for screening and prevention. These are reviewed and up-to-date. Referrals have been placed accordingly. Immunizations are up-to-date or declined.    Subjective:   Chief Complaint  Patient presents with  . Follow-up   [name] [age] y.o. male presents to office today for follow up of sore throat from previous office visit on 03-21-2017. Symptoms have resolved at this time. VRI was used to communicate directly with patient for the entire encounter including providing detailed patient instructions.   Hospital Follow Up He was seen in the ED for possible allergic reaction to cyclobenazeprine. He was noted for numbness in the the right side of his face and right arm along with dizziness and states he almost died. He had been prescribed flexeril for back pain and felt his symptoms were due to the medication.  He has stopped taking cyclobenazeprine and robaxin due to his symptoms. He reports increased anxiety and is requesting "a blue pill" that he was given here by another provider. Today he denies dizziness and right sided weakness. He does however complain of right facial neuralgia.  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    Cervical neck pain CT of cervical spine 04-03-2017 was negative. He states the pain radiates to bilateral shoulders and into bilateral trapezius muscles. He has tried multiple medications for this in the past including intermittent heat application, muscle relaxants, NSAID and steroids with little relief of pain. His work does involve heavy lifting. Will refer to Physical therapy to evaluate and treat with possible TENS therapy as well.   Anxiety Chronic. He has a history of anxiety and depression. HE was prescribed trazodone in the past as well as ativan.  I am unwilling to prescribe him benzos and he declines trazodone. Will try atarax.   Review of Systems  Constitutional: Negative for fever, malaise/fatigue and weight loss.  HENT: Negative for sore throat.   Eyes: Negative.  Negative for blurred vision, double vision and photophobia.  Respiratory: Negative.  Negative for cough and shortness of breath.   Cardiovascular: Negative.  Negative for chest pain, palpitations and leg swelling.  Gastrointestinal: Negative.  Negative for heartburn, nausea and vomiting.  Genitourinary: Negative.   Musculoskeletal: Positive for back pain, myalgias and neck pain. Negative for falls.  Neurological: Positive for tingling, sensory change and headaches. Negative for dizziness, tremors, speech change, focal weakness, seizures and weakness.  Psychiatric/Behavioral: Negative for suicidal ideas. The patient is nervous/anxious and has insomnia.     Past Medical History:  Diagnosis Date  . Asthma   . Herpes   .  Shingles     No past surgical history on file.  Family History  Problem Relation Age of Onset  . Cancer Father     Social History Reviewed with no changes to be made today.   Outpatient Medications Prior to Visit  Medication Sig Dispense Refill  . meloxicam (MOBIC) 7.5 MG  tablet Take 1 tablet (7.5 mg total) by mouth at bedtime as needed for pain. 30 tablet 1   No facility-administered medications prior to visit.     Allergies  Allergen Reactions  . Cyclobenzaprine     Numbness  . Methocarbamol     Abdominal pain  . Amoxicillin Itching    Pruritic rash  . Penicillins Palpitations    Has patient had a PCN reaction causing immediate rash, facial/tongue/throat swelling, SOB or lightheadedness with hypotension: No Has patient had a PCN reaction causing severe rash involving mucus membranes or skin necrosis: No Has patient had a PCN reaction that required hospitalization: No Has patient had a PCN reaction occurring within the last 10 years: No If all of the above answers are "NO", then may proceed with Cephalosporin use.       Objective:    BP 109/69   Pulse 72   Temp 98.4 F (36.9 C) (Oral)   Resp 16   Wt 194 lb 9.6 oz (88.3 kg)   SpO2 99%   BMI 31.41 kg/m  Wt Readings from Last 3 Encounters:  04/15/17 194 lb 9.6 oz (88.3 kg)  03/21/17 195 lb 3.2 oz (88.5 kg)  12/20/16 196 lb (88.9 kg)    Physical Exam  Constitutional: He is oriented to person, place, and time. He appears well-developed and well-nourished. He is cooperative.  HENT:  Head: Normocephalic and atraumatic.  Eyes: EOM are normal.  Neck: Normal range of motion.  Cardiovascular: Normal rate, regular rhythm and normal heart sounds. Exam reveals no gallop and no friction rub.  No murmur heard. Pulmonary/Chest: Effort normal and breath sounds normal. No tachypnea. No respiratory distress. He has no decreased breath sounds. He has no wheezes. He has no rhonchi. He has no rales. He exhibits no tenderness.  Abdominal: Bowel sounds are normal.  Musculoskeletal: Normal range of motion. He exhibits no edema.       Back:  Neurological: He is alert and oriented to person, place, and time. He has normal strength. No cranial nerve deficit. Coordination and gait normal.  Decreased sensation  along right mandible.  No dysarthria, facial droop or other cranial nerve deficits noted.   Skin: Skin is warm and dry.  Psychiatric: He has a normal mood and affect. His speech is normal and behavior is normal. Judgment and thought content normal. His mood appears not anxious. Cognition and memory are normal. He expresses no homicidal and no suicidal ideation. He expresses no suicidal plans and no homicidal plans.  Nursing note and vitals reviewed.        Patient has been counseled extensively about nutrition and exercise as well as the importance of adherence with medications and regular follow-up. The patient was given clear instructions to go to ER or return to medical center if symptoms don't improve, worsen or new problems develop. The patient verbalized understanding.   Follow-up: Return if symptoms worsen or fail to improve.   Claiborne Rigg, FNP-BC [facility] [place], [place] [phone]   04/15/2017, 12:58 PM

## 2017-05-03 ENCOUNTER — Ambulatory Visit: Payer: Self-pay | Admitting: Physical Therapy

## 2017-05-15 ENCOUNTER — Encounter (HOSPITAL_COMMUNITY): Payer: Self-pay | Admitting: *Deleted

## 2017-05-15 ENCOUNTER — Emergency Department (HOSPITAL_COMMUNITY): Payer: Self-pay

## 2017-05-15 ENCOUNTER — Emergency Department (HOSPITAL_COMMUNITY)
Admission: EM | Admit: 2017-05-15 | Discharge: 2017-05-15 | Disposition: A | Discharge status: Home / Self Care | Payer: Self-pay | Attending: Emergency Medicine | Admitting: Emergency Medicine

## 2017-05-15 ENCOUNTER — Other Ambulatory Visit: Payer: Self-pay

## 2017-05-15 DIAGNOSIS — J45909 Unspecified asthma, uncomplicated: Secondary | ICD-10-CM | POA: Insufficient documentation

## 2017-05-15 DIAGNOSIS — R42 Dizziness and giddiness: Secondary | ICD-10-CM | POA: Insufficient documentation

## 2017-05-15 DIAGNOSIS — Z87891 Personal history of nicotine dependence: Secondary | ICD-10-CM | POA: Insufficient documentation

## 2017-05-15 LAB — COMPREHENSIVE METABOLIC PANEL
ALK PHOS: 74 U/L (ref 38–126)
ALT: 34 U/L (ref 17–63)
AST: 27 U/L (ref 15–41)
Albumin: 4.3 g/dL (ref 3.5–5.0)
Anion gap: 9 (ref 5–15)
BUN: 12 mg/dL (ref 6–20)
CO2: 24 mmol/L (ref 22–32)
CREATININE: 0.74 mg/dL (ref 0.61–1.24)
Calcium: 8.9 mg/dL (ref 8.9–10.3)
Chloride: 105 mmol/L (ref 101–111)
GFR calc Af Amer: >60 mL/min (ref >60)
GLUCOSE: 130 mg/dL — AB (ref 65–99)
POTASSIUM: 3.2 mmol/L — AB (ref 3.5–5.1)
Sodium: 138 mmol/L (ref 135–145)
TOTAL PROTEIN: 7 g/dL (ref 6.5–8.1)
Total Bilirubin: 0.8 mg/dL (ref 0.3–1.2)

## 2017-05-15 LAB — URINALYSIS, ROUTINE W REFLEX MICROSCOPIC
BILIRUBIN URINE: NEGATIVE
GLUCOSE, UA: NEGATIVE mg/dL
Hgb urine dipstick: NEGATIVE
KETONES UR: NEGATIVE mg/dL
Leukocytes, UA: NEGATIVE
Nitrite: NEGATIVE
PH: 5 (ref 5.0–8.0)
PROTEIN: NEGATIVE mg/dL
Specific Gravity, Urine: 1.024 (ref 1.005–1.030)

## 2017-05-15 LAB — CBC
HEMATOCRIT: 43.1 % (ref 39.0–52.0)
Hemoglobin: 14.7 g/dL (ref 13.0–17.0)
MCH: 28.7 pg (ref 26.0–34.0)
MCHC: 34.1 g/dL (ref 30.0–36.0)
MCV: 84.2 fL (ref 78.0–100.0)
PLATELETS: 229 10*3/uL (ref 150–400)
RBC: 5.12 MIL/uL (ref 4.22–5.81)
RDW: 13.4 % (ref 11.5–15.5)
WBC: 7.7 10*3/uL (ref 4.0–10.5)

## 2017-05-15 LAB — LIPASE, BLOOD: Lipase: 32 U/L (ref 11–51)

## 2017-05-15 MED ORDER — SODIUM CHLORIDE 0.9 % IV SOLN: INTRAVENOUS | Status: DC

## 2017-05-15 MED ORDER — DIAZEPAM 2 MG PO TABS: 2.0000 mg | ORAL_TABLET | Freq: Four times a day (QID) | ORAL | 0 refills | Status: DC | PRN

## 2017-05-15 MED ORDER — DIPHENHYDRAMINE HCL 50 MG/ML IJ SOLN
12.5000 mg | Freq: Once | INTRAMUSCULAR | Status: AC
  Administered 2017-05-15: 12.5 mg via INTRAVENOUS
  Filled 2017-05-15: qty 1

## 2017-05-15 MED ORDER — MECLIZINE HCL 25 MG PO TABS: 25.0000 mg | ORAL_TABLET | Freq: Three times a day (TID) | ORAL | 0 refills | Status: DC | PRN

## 2017-05-15 MED ORDER — METOCLOPRAMIDE HCL 5 MG/ML IJ SOLN
5.0000 mg | Freq: Once | INTRAMUSCULAR | Status: AC
Start: 2017-05-15 — End: 2017-05-15
  Administered 2017-05-15: 5 mg via INTRAVENOUS
  Filled 2017-05-15: qty 2

## 2017-05-15 MED ORDER — MECLIZINE HCL 25 MG PO TABS
25.0000 mg | ORAL_TABLET | Freq: Once | ORAL | Status: AC
  Administered 2017-05-15: 25 mg via ORAL
  Filled 2017-05-15: qty 1

## 2017-05-15 MED ORDER — ONDANSETRON 4 MG PO TBDP
8.0000 mg | ORAL_TABLET | Freq: Once | ORAL | Status: AC
  Administered 2017-05-15: 8 mg via ORAL
  Filled 2017-05-15: qty 2

## 2017-05-15 MED ORDER — SODIUM CHLORIDE 0.9 % IV BOLUS
2000.0000 mL | Freq: Once | INTRAVENOUS | Status: AC
  Administered 2017-05-15: 2000 mL via INTRAVENOUS

## 2017-05-15 MED ORDER — DIAZEPAM 2 MG PO TABS
2.0000 mg | ORAL_TABLET | Freq: Once | ORAL | Status: AC
  Administered 2017-05-15: 2 mg via ORAL
  Filled 2017-05-15: qty 1

## 2017-05-15 NOTE — ED Triage Notes (Signed)
The pt is  C/o n v since yesterday he speaks little english  A 47 yr old is at bedside helping intrepret.  No pain

## 2017-05-15 NOTE — ED Notes (Signed)
Patient verbalizes understanding of discharge instructions. Opportunity for questioning and answers were provided. Armband removed by staff, pt discharged from ED.  

## 2017-05-15 NOTE — ED Provider Notes (Addendum)
[facility] EMERGENCY DEPARTMENT Provider Note   CSN: [id number] Arrival date & time: 05/15/17  0203     History   Chief Complaint Chief Complaint  Patient presents with  . Emesis    [name] is a 47 y.o. male.  47 year old male presents with acute onset of dizziness that began last night after he ate a meal.  Had some associated mild headache with nausea but no vomiting.  No visual changes appreciated.  Symptoms are worse with standing.  Denies any new medication use.  No fever or chills.  No chest or abdominal discomfort.  States that he feels dizzy when he tries to walk.  Denies any weakness in his arms or legs.  No prior history of same and no treatment use prior to arrival.  History was obtained by the use of an interpreter     Past Medical History:  Diagnosis Date  . Asthma   . Herpes   . Shingles     Patient Active Problem List   Diagnosis Date Noted  . Asthma due to environmental allergies 12/20/2016  . HSV (herpes simplex virus) infection 02/24/2015  . Anxiety 04/02/2014    History reviewed. No pertinent surgical history.      Home Medications    Prior to Admission medications   Medication Sig Start Date End Date Taking? Authorizing Provider  hydrOXYzine (ATARAX/VISTARIL) 25 MG tablet Take 1 tablet (25 mg total) by mouth 3 (three) times daily as needed for anxiety. 04/15/17 05/15/17  Claiborne Rigg, NP  meloxicam (MOBIC) 7.5 MG tablet Take 1 tablet (7.5 mg total) by mouth at bedtime as needed for pain. 04/03/17   Wallis Bamberg, PA-C    Family History Family History  Problem Relation Age of Onset  . Cancer Father     Social History Social History   Tobacco Use  . Smoking status: Former Games developer  . Smokeless tobacco: Never Used  Substance Use Topics  . Alcohol use: No  . Drug use: No     Allergies   Cyclobenzaprine; Methocarbamol; Amoxicillin; and Penicillins   Review of Systems Review of Systems  All other systems  reviewed and are negative.    Physical Exam Updated Vital Signs BP 103/67 (BP Location: Right Arm)   Pulse 80   Temp 97.9 F (36.6 C) (Oral)   Resp 20   Ht 1.753 m ( )   Wt 86.2 kg (190 lb)   SpO2 98%   BMI 28.06 kg/m   Physical Exam  Constitutional: He is oriented to person, place, and time. He appears well-developed and well-nourished.  Non-toxic appearance. No distress.  HENT:  Head: Normocephalic and atraumatic.  Eyes: Pupils are equal, round, and reactive to light. Conjunctivae, EOM and lids are normal.  Neck: Normal range of motion. Neck supple. No tracheal deviation present. No thyroid mass present.  Cardiovascular: Normal rate, regular rhythm and normal heart sounds. Exam reveals no gallop.  No murmur heard. Pulmonary/Chest: Effort normal and breath sounds normal. No stridor. No respiratory distress. He has no decreased breath sounds. He has no wheezes. He has no rhonchi. He has no rales.  Abdominal: Soft. Normal appearance and bowel sounds are normal. He exhibits no distension. There is no tenderness. There is no rebound and no CVA tenderness.  Musculoskeletal: Normal range of motion. He exhibits no edema or tenderness.  Neurological: He is alert and oriented to person, place, and time. He has normal strength. He displays no tremor. No cranial nerve  deficit or sensory deficit. He displays a negative Romberg sign. Coordination abnormal. GCS eye subscore is 4. GCS verbal subscore is 5. GCS motor subscore is 6.  Skin: Skin is warm and dry. No abrasion and no rash noted.  Psychiatric: He has a normal mood and affect. His speech is normal and behavior is normal.  Nursing note and vitals reviewed.    ED Treatments / Results  Labs (all labs ordered are listed, but only abnormal results are displayed) Labs Reviewed  COMPREHENSIVE METABOLIC PANEL - Abnormal; Notable for the following components:      Result Value   Potassium 3.2 (*)    Glucose, Bld 130 (*)    All other  components within normal limits  LIPASE, BLOOD  CBC  URINALYSIS, ROUTINE W REFLEX MICROSCOPIC    EKG None  Radiology No results found.  Procedures Procedures (including critical care time)  Medications Ordered in ED Medications  sodium chloride 0.9 % bolus 2,000 mL (has no administration in time range)  0.9 %  sodium chloride infusion (has no administration in time range)  metoCLOPramide (REGLAN) injection 5 mg (has no administration in time range)  diphenhydrAMINE (BENADRYL) injection 12.5 mg (has no administration in time range)  ondansetron (ZOFRAN-ODT) disintegrating tablet 8 mg (8 mg Oral Given 05/15/17 0246)     Initial Impression / Assessment and Plan / ED Course  I have reviewed the triage vital signs and the nursing notes.  Pertinent labs & imaging results that were available during my care of the patient were reviewed by me and considered in my medical decision making (see chart for details).     10:26 AM Patient treated with IV fluids and Reglan and does feel somewhat better.  When patient stood up he felt dizzy with certain movements of his head.  Will treat with Antivert.  Head CT is pending at this time.   2:05 PM Patient felt better after Antivert.  MRI of brain is negative.  Suspect vertigo and will discharge home  Final Clinical Impressions(s) / ED Diagnoses   Final diagnoses:  None    ED Discharge Orders    None       Lorre Nick, MD 05/15/17 1026    Lorre Nick, MD 05/15/17 [phone]

## 2017-05-15 NOTE — ED Notes (Signed)
Pt transported to MRI 

## 2017-05-15 NOTE — ED Notes (Signed)
Patient transported to CT 

## 2017-05-20 ENCOUNTER — Encounter (HOSPITAL_COMMUNITY): Payer: Self-pay

## 2017-05-20 ENCOUNTER — Ambulatory Visit (HOSPITAL_COMMUNITY)
Admission: EM | Admit: 2017-05-20 | Discharge: 2017-05-20 | Disposition: A | Discharge status: Home / Self Care | Payer: Self-pay | Attending: Urgent Care | Admitting: Urgent Care

## 2017-05-20 DIAGNOSIS — Z9109 Other allergy status, other than to drugs and biological substances: Secondary | ICD-10-CM

## 2017-05-20 DIAGNOSIS — M542 Cervicalgia: Secondary | ICD-10-CM

## 2017-05-20 DIAGNOSIS — J3489 Other specified disorders of nose and nasal sinuses: Secondary | ICD-10-CM

## 2017-05-20 DIAGNOSIS — H9201 Otalgia, right ear: Secondary | ICD-10-CM

## 2017-05-20 DIAGNOSIS — R42 Dizziness and giddiness: Secondary | ICD-10-CM

## 2017-05-20 DIAGNOSIS — H9311 Tinnitus, right ear: Secondary | ICD-10-CM

## 2017-05-20 DIAGNOSIS — J0100 Acute maxillary sinusitis, unspecified: Secondary | ICD-10-CM

## 2017-05-20 MED ORDER — CETIRIZINE HCL 10 MG PO TABS: 10.0000 mg | ORAL_TABLET | Freq: Every day | ORAL | 11 refills | Status: DC

## 2017-05-20 MED ORDER — PSEUDOEPHEDRINE HCL ER 120 MG PO TB12: 120.0000 mg | ORAL_TABLET | Freq: Two times a day (BID) | ORAL | 3 refills | Status: DC

## 2017-05-20 MED ORDER — DOXYCYCLINE HYCLATE 100 MG PO CAPS: 100.0000 mg | ORAL_CAPSULE | Freq: Two times a day (BID) | ORAL | 0 refills | Status: DC

## 2017-05-20 NOTE — Discharge Instructions (Signed)
Hydrate well with at least 2 liters (1 gallon) of water daily.  °

## 2017-05-20 NOTE — ED Provider Notes (Signed)
MRN: [id number] DOB: [birth year]  Subjective:   Gabriel Ball is a 47 y.o. male presenting for 1 week history of persistent right ear fullness, right ear pain, dizziness and vertigo, sinus congestion, scratchy throat and cough.  Patient had a lot of nausea with vomiting initially and was seen in the ER for this.  He was prescribed meclizine and diazepam and reports that his nausea and vomiting have resolved since.  However he continues to have sinus symptoms.  He admits a history of allergies but does not take medications for this.  Denies headache, sore throat, chest pain, shortness of breath, wheezing, belly pain.  Denies smoking cigarettes.  He does have a history of asthma but does not use an inhaler currently.  No current facility-administered medications for this encounter.   Current Outpatient Medications:  .  diazepam (VALIUM) 2 MG tablet, Take 1 tablet (2 mg total) by mouth every 6 (six) hours as needed (Dizziness)., Disp: 10 tablet, Rfl: 0 .  meclizine (ANTIVERT) 25 MG tablet, Take 1 tablet (25 mg total) by mouth 3 (three) times daily as needed for dizziness., Disp: 30 tablet, Rfl: 0 .  meloxicam (MOBIC) 7.5 MG tablet, Take 1 tablet (7.5 mg total) by mouth at bedtime as needed for pain., Disp: 30 tablet, Rfl: 1   Allergies  Allergen Reactions  . Cyclobenzaprine     Facial Numbness/dizziness/hypersomnolence  . Methocarbamol     Abdominal pain  . Amoxicillin Itching    Pruritic rash  . Penicillins Palpitations    Has patient had a PCN reaction causing immediate rash, facial/tongue/throat swelling, SOB or lightheadedness with hypotension: No Has patient had a PCN reaction causing severe rash involving mucus membranes or skin necrosis: No Has patient had a PCN reaction that required hospitalization: No Has patient had a PCN reaction occurring within the last 10 years: No If all of the above answers are "NO", then may proceed with Cephalosporin use.    Past Medical History:   Diagnosis Date  . Asthma   . Herpes   . Shingles      Denies past surgical history.  Objective:   Vitals: BP 119/77   Pulse 89   Temp 98.6 F (37 C)   Resp 18   Wt 190 lb (86.2 kg)   SpO2 98%   BMI 28.06 kg/m   Physical Exam  Constitutional: He is oriented to person, place, and time. He appears well-developed and well-nourished.  HENT:  Right TM opaque but without erythema and no tragus tenderness.  Right maxillary sinus tenderness, mucosal edema and rhinorrhea.  Throat with postnasal drainage and cobblestone pattern.  Eyes: Right eye exhibits no discharge. Left eye exhibits no discharge.  Neck: Normal range of motion. Neck supple.  Cardiovascular: Normal rate, regular rhythm and intact distal pulses. Exam reveals no gallop and no friction rub.  No murmur heard. Pulmonary/Chest: No respiratory distress. He has no wheezes. He has no rales.  Lymphadenopathy:    He has no cervical adenopathy.  Neurological: He is alert and oriented to person, place, and time.  Skin: Skin is warm and dry.  Psychiatric: He has a normal mood and affect.   Assessment and Plan :   Dizziness  Neck pain  Sinus pain  Right ear pain  Tinnitus of right ear  Environmental allergies  Acute non-recurrent maxillary sinusitis  We will start doxycycline to address his sinusitis.  He is to start allergy medications including Zyrtec and pseudoephedrine for his ETD.  Patient does not hydrate  at all with water, recommended lifestyle modifications. Return-to-clinic precautions discussed, patient verbalized understanding.    Wallis Bamberg, [place] 05/20/17 1929

## 2017-05-20 NOTE — ED Triage Notes (Signed)
Pt presents with complaints of dizziness, neck pain and emesis since Saturday.  He was seen in the ER and had a full work up and sent home with Meclizine and Valium.  Pt has no neurological deficits in triage.

## 2017-05-24 ENCOUNTER — Encounter: Payer: Self-pay | Admitting: Nurse Practitioner

## 2017-05-24 ENCOUNTER — Ambulatory Visit: Payer: Self-pay | Attending: Nurse Practitioner | Admitting: Nurse Practitioner

## 2017-05-24 VITALS — BP 111/71 | HR 83 | Temp 99.4°F | Ht 66.0 in | Wt 201.4 lb

## 2017-05-24 DIAGNOSIS — M545 Low back pain: Secondary | ICD-10-CM | POA: Insufficient documentation

## 2017-05-24 DIAGNOSIS — B0089 Other herpesviral infection: Secondary | ICD-10-CM | POA: Insufficient documentation

## 2017-05-24 DIAGNOSIS — G8929 Other chronic pain: Secondary | ICD-10-CM | POA: Insufficient documentation

## 2017-05-24 DIAGNOSIS — E119 Type 2 diabetes mellitus without complications: Secondary | ICD-10-CM | POA: Insufficient documentation

## 2017-05-24 DIAGNOSIS — Z9114 Patient's other noncompliance with medication regimen: Secondary | ICD-10-CM | POA: Insufficient documentation

## 2017-05-24 DIAGNOSIS — Z791 Long term (current) use of non-steroidal anti-inflammatories (NSAID): Secondary | ICD-10-CM | POA: Insufficient documentation

## 2017-05-24 DIAGNOSIS — Z880 Allergy status to penicillin: Secondary | ICD-10-CM | POA: Insufficient documentation

## 2017-05-24 DIAGNOSIS — Z79899 Other long term (current) drug therapy: Secondary | ICD-10-CM | POA: Insufficient documentation

## 2017-05-24 DIAGNOSIS — J320 Chronic maxillary sinusitis: Secondary | ICD-10-CM | POA: Insufficient documentation

## 2017-05-24 DIAGNOSIS — B009 Herpesviral infection, unspecified: Secondary | ICD-10-CM

## 2017-05-24 DIAGNOSIS — J45909 Unspecified asthma, uncomplicated: Secondary | ICD-10-CM | POA: Insufficient documentation

## 2017-05-24 MED ORDER — MECLIZINE HCL 25 MG PO TABS: 25.0000 mg | ORAL_TABLET | Freq: Three times a day (TID) | ORAL | 0 refills | Status: DC | PRN

## 2017-05-24 MED ORDER — ACYCLOVIR 400 MG PO TABS: 400.0000 mg | ORAL_TABLET | Freq: Two times a day (BID) | ORAL | 6 refills | Status: DC

## 2017-05-24 NOTE — Progress Notes (Signed)
Assessment & Plan:  Gabriel Ball was seen today for follow-up and diabetes.  Diagnoses and all orders for this visit:  Chronic bilateral low back pain without sciatica Continue meloxicam as prescribed. Work on losing weight to help reduce back pain. May alternate with heat and ice application for pain relief. May also alternate with acetaminophen  as prescribed for back pain. Other alternatives include massage, acupuncture and water aerobics.  You must stay active and avoid a sedentary lifestyle.   Chronic maxillary sinusitis -     meclizine (ANTIVERT) 25 MG tablet; Take 1 tablet (25 mg total) by mouth 3 (three) times daily as needed for up to 7 days for dizziness. INSTRUCTIONS: Do not stop taking your doxycycline.  Complete all doses as prescribed!!!  HSV infection CHRONIC: Endorses frequent recurrences.  Will treat at this time for HSV suppression.  -acyclovir (ZOVIRAX) 400 MG tablet; Take 1 tablet (400 mg total) by mouth 2 (two) times daily.   Patient has been counseled on age-appropriate routine health concerns for screening and prevention. These are reviewed and up-to-date. Referrals have been placed accordingly. Immunizations are up-to-date or declined.    Subjective:   Chief Complaint  Patient presents with  . Follow-up    Pt. is here for a follow-up on his back pain. Pt. stated the physcial therapy was too expensive.   . Diabetes   [name] 47 y.o. male presents to office today for follow up to back pain. VRI was used to communicate directly with patient for the entire encounter including providing detailed patient instructions.    Back Pain Could not afford PT. So at this time will continue conservative management: Meloxicam, home exercises, back brace.   Vertigo Seen in the ED on 2 separate occasions for vertigo and dizziness. On 05-15-2017 with complaints of acute onset of dizziness and mild headache with nausea and no vomiting.  Symptoms were worse with standing at  that time.  He was treated with IV fluids and Reglan as well as Antivert.  Head CT was negative as well as MRI of brain.  Discharge diagnosis was vertigo patient was stable upon discharge at that time with meclizine and diazepam.  On 06/16/2017 patient was seen in the ED with complaints of right ear fullness, right ear pain, dizziness and vertigo, sinus congestion as well as sore throat and cough.  He was diagnosed with acute maxillary sinusitis and started on doxycycline, zyrtec and pseudoephedrine for eustachian tube dysfunction. Today he endorses continued vertigo and dizziness despite taking meclizine TID.  Unfortunately he has not taken his doxycycline as instructed and reports he has missed several doses.  I instructed him that the dizziness will likely take longer to resolve if he is not compliant with taking his doxycycline to treat his maxillary sinusitis. He verbalized understanding.    Review of Systems  Constitutional: Negative for fever, malaise/fatigue and weight loss.  HENT: Negative.  Negative for nosebleeds.   Eyes: Negative.  Negative for blurred vision, double vision and photophobia.  Respiratory: Negative.  Negative for cough and shortness of breath.   Cardiovascular: Negative.  Negative for chest pain, palpitations and leg swelling.  Gastrointestinal: Negative.  Negative for heartburn, nausea and vomiting.  Musculoskeletal: Positive for back pain and myalgias.  Neurological: Negative.  Negative for dizziness, focal weakness, seizures and headaches.  Psychiatric/Behavioral: Negative.  Negative for suicidal ideas.    Past Medical History:  Diagnosis Date  . Asthma   . Herpes   . Shingles  History reviewed. No pertinent surgical history.  Family History  Problem Relation Age of Onset  . Cancer Father     Social History Reviewed with no changes to be made today.   Outpatient Medications Prior to Visit  Medication Sig Dispense Refill  . cetirizine (ZYRTEC ALLERGY)  10 MG tablet Take 1 tablet (10 mg total) by mouth daily. 30 tablet 11  . doxycycline (VIBRAMYCIN) 100 MG capsule Take 1 capsule (100 mg total) by mouth 2 (two) times daily. 20 capsule 0  . meclizine (ANTIVERT) 25 MG tablet Take 1 tablet (25 mg total) by mouth 3 (three) times daily as needed for dizziness. 30 tablet 0  . diazepam (VALIUM) 2 MG tablet Take 1 tablet (2 mg total) by mouth every 6 (six) hours as needed (Dizziness). (Patient not taking: Reported on 05/24/2017) 10 tablet 0  . meloxicam (MOBIC) 7.5 MG tablet Take 1 tablet (7.5 mg total) by mouth at bedtime as needed for pain. (Patient not taking: Reported on 05/24/2017) 30 tablet 1  . pseudoephedrine (SUDAFED 12 HOUR) 120 MG 12 hr tablet Take 1 tablet (120 mg total) by mouth 2 (two) times daily. (Patient not taking: Reported on 05/24/2017) 30 tablet 3   No facility-administered medications prior to visit.     Allergies  Allergen Reactions  . Cyclobenzaprine     Facial Numbness/dizziness/hypersomnolence  . Methocarbamol     Abdominal pain  . Amoxicillin Itching    Pruritic rash  . Penicillins Palpitations    Has patient had a PCN reaction causing immediate rash, facial/tongue/throat swelling, SOB or lightheadedness with hypotension: No Has patient had a PCN reaction causing severe rash involving mucus membranes or skin necrosis: No Has patient had a PCN reaction that required hospitalization: No Has patient had a PCN reaction occurring within the last 10 years: No If all of the above answers are "NO", then may proceed with Cephalosporin use.       Objective:    BP 111/71 (BP Location: Left Arm, Patient Position: Sitting, Cuff Size: Normal)   Pulse 83   Temp 99.4 F (37.4 C) (Oral)   Ht  (1.676 m)   Wt 201 lb 6.4 oz (91.4 kg)   SpO2 97%   BMI 32.51 kg/m  Wt Readings from Last 3 Encounters:  05/24/17 201 lb 6.4 oz (91.4 kg)  05/20/17 190 lb (86.2 kg)  05/15/17 190 lb (86.2 kg)    Physical Exam  Constitutional: He is  oriented to person, place, and time. He appears well-developed and well-nourished. He is cooperative.  HENT:  Head: Normocephalic and atraumatic.  Right Ear: A middle ear effusion is present.  Left Ear: A middle ear effusion is present.  Nose: Mucosal edema and rhinorrhea present.  Mouth/Throat: Uvula is midline, oropharynx is clear and moist and mucous membranes are normal.  Eyes: EOM are normal.  Neck: Normal range of motion.  Cardiovascular: Normal rate, regular rhythm and normal heart sounds. Exam reveals no gallop and no friction rub.  No murmur heard. Pulmonary/Chest: Effort normal and breath sounds normal. No tachypnea. No respiratory distress. He has no decreased breath sounds. He has no wheezes. He has no rhonchi. He has no rales. He exhibits no tenderness.  Abdominal: Soft. Bowel sounds are normal.  Musculoskeletal: Normal range of motion. He exhibits no edema.  Neurological: He is alert and oriented to person, place, and time. Coordination normal.  Skin: Skin is warm and dry.  Psychiatric: He has a normal mood and affect. His behavior  is normal. Judgment and thought content normal.  Nursing note and vitals reviewed.      Patient has been counseled extensively about nutrition and exercise as well as the importance of adherence with medications and regular follow-up. The patient was given clear instructions to go to ER or return to medical center if symptoms don't improve, worsen or new problems develop. The patient verbalized understanding.   Follow-up: Return if symptoms worsen or fail to improve.   Claiborne Rigg, FNP-BC [facility] [place], [place] [phone]   05/26/2017, 10:10 AM

## 2017-05-24 NOTE — Patient Instructions (Signed)
Sinusitis en los adultos  (Sinusitis, Adult)  La sinusitis es la inflamacin y el dolor en los senos paranasales. Los senos paranasales son espacios vacos en los huesos alrededor del rostro. Estos se encuentran en estos lugares:   Alrededor de los ojos.   En la mitad de la frente.   Detrs de la nariz.   En los pmulos.  Los senos y las fosas nasales estn cubiertos de un lquido fibroso (mucosidad). Normalmente, la mucosidad drena a travs de los senos. Cuando los tejidos nasales se inflaman o hinchan, la mucosidad puede quedar atrapada o bloqueada, por lo que el aire no puede fluir por los senos paranasales. Esto permite que se desarrollen bacterias, virus y hongos, lo que produce infecciones.  CUIDADOS EN EL HOGAR  Medicamentos   Tome, use o aplique los medicamentos de venta libre y los recetados solamente como se lo haya indicado el mdico. Estos pueden incluir aerosoles nasales.   Si le recetaron un antibitico, tmelo como se lo haya indicado el mdico. No deje de tomar los antibiticos aunque comience a sentirse mejor.  Hidrtese y humidifique los ambientes   Beba suficiente agua para mantener la orina clara o de color amarillo plido.   Use un humidificador de vapor fro para mantener la humedad de su hogar por encima del 50%.   Inhale vapor durante 10a 15minutos, de 3a 4veces al da o como se lo haya indicado el mdico. Puede hacer esto en el bao con el vapor del agua caliente de la ducha.   Trate de no exponerse al aire fro o seco.  Reposo   Descanse todo lo que pueda.   Duerma con la cabeza elevada.   Asegrese de dormir lo suficiente cada noche.  Instrucciones generales   Pngase un pao caliente y hmedo en el rostro 3o 4veces al da, o como se lo haya indicado su mdico. Esto ayuda a calmar las molestias.   Lave sus manos frecuentemente con agua y jabn. Use un desinfectante para manos si no dispone de agua y jabn.   No fume. Evite estar cerca de personas que fuman (fumador  pasivo).   Concurra a todas las visitas de control como se lo haya indicado el mdico. Esto es importante.  SOLICITE AYUDA SI:   Tiene fiebre.   Los sntomas empeoran.   Los sntomas no mejoran en el perodo de 10das.  SOLICITE AYUDA DE INMEDIATO SI:   Siente un dolor de cabeza muy intenso.   No puede dejar de vomitar.   Tiene dolor o hinchazn en la zona del rostro o los ojos.   Tiene dificultad para ver.   Se siente confundido.   Tiene el cuello rgido.   Tiene dificultad para respirar.  Esta informacin no tiene como fin reemplazar el consejo del mdico. Asegrese de hacerle al mdico cualquier pregunta que tenga.  Document Released: 09/29/2011 Document Revised: 04/28/2015 Document Reviewed: 10/30/2014  Elsevier Interactive Patient Education  2018 Elsevier Inc.

## 2017-05-27 ENCOUNTER — Encounter (HOSPITAL_COMMUNITY): Payer: Self-pay | Admitting: Emergency Medicine

## 2017-05-27 ENCOUNTER — Ambulatory Visit (HOSPITAL_COMMUNITY)
Admission: EM | Admit: 2017-05-27 | Discharge: 2017-05-27 | Disposition: A | Discharge status: Home / Self Care | Payer: Self-pay | Attending: Family Medicine | Admitting: Family Medicine

## 2017-05-27 DIAGNOSIS — G43809 Other migraine, not intractable, without status migrainosus: Secondary | ICD-10-CM

## 2017-05-27 DIAGNOSIS — G43109 Migraine with aura, not intractable, without status migrainosus: Secondary | ICD-10-CM

## 2017-05-27 DIAGNOSIS — J320 Chronic maxillary sinusitis: Secondary | ICD-10-CM

## 2017-05-27 MED ORDER — SUMATRIPTAN SUCCINATE 50 MG PO TABS: ORAL_TABLET | ORAL | 0 refills | Status: DC

## 2017-05-27 MED ORDER — NEOMYCIN-POLYMYXIN-HC 3.5-10000-1 OT SUSP: 4.0000 [drp] | Freq: Three times a day (TID) | OTIC | 0 refills | Status: DC

## 2017-05-27 MED ORDER — MECLIZINE HCL 25 MG PO TABS: 25.0000 mg | ORAL_TABLET | Freq: Three times a day (TID) | ORAL | 0 refills | Status: AC | PRN

## 2017-05-27 NOTE — ED Triage Notes (Signed)
Pain in right ear with a fluttering sensation in right eye. PT reports congestion as well. PT was treated for vertigo recently. PT reports a foreign body sensation in right ear. Denies dizziness at this time.

## 2017-05-27 NOTE — ED Provider Notes (Signed)
[facility]    CSN: [id number] Arrival date & time: 05/27/17  1759     History   Chief Complaint Chief Complaint  Patient presents with  . Otalgia    [name] is a 47 y.o. male.   HPI  Patient is here for his fifth visit since March for ear pain, right face pain, headache, and vertigo.  He has had a CAT scan of his head, he has had an MRI of his brain.  These have both been normal.  He has been treated with meclizine and diazepam for the vertigo.  Is been treated with doxycycline for the ear pain.  He has been prescribed Zyrtec for allergy.  He received Benadryl injections in the emergency room.  He also got a headache cocktail with Reglan and Zofran injections. In spite of this he is back in the office today with right head pain, vertigo, right ear pain.  Today he also has photophobia.  Headache is on the right side of his head.  No change in hearing, no drainage from the ear.  He sometimes feels like he is tearing from just the right eye.  No trauma.  No fever or illness.  He is never had migraines before.  He has had vertigo once when he was a child in [place].  He has tried a number of home remedies.  Nothing is given him any significant relief.  He has been able to work for a couple of weeks.  He has nausea but no vomiting.  He has vertigo randomly when he is laying sitting or standing.  He has not fallen.  Interpreter used via tablet  Past Medical History:  Diagnosis Date  . Asthma   . Herpes   . Shingles     Patient Active Problem List   Diagnosis Date Noted  . Asthma due to environmental allergies 12/20/2016  . HSV (herpes simplex virus) infection 02/24/2015  . Anxiety 04/02/2014    History reviewed. No pertinent surgical history.     Home Medications    Prior to Admission medications   Medication Sig Start Date End Date Taking? Authorizing Provider  cetirizine (ZYRTEC ALLERGY) 10 MG tablet Take 1 tablet (10 mg total) by mouth daily. 05/20/17   Yes Wallis Bamberg, PA-C  meclizine (ANTIVERT) 25 MG tablet Take 1 tablet (25 mg total) by mouth 3 (three) times daily as needed for up to 7 days for dizziness. 05/27/17 06/03/17  Eustace Moore, MD  neomycin-polymyxin-hydrocortisone (CORTISPORIN) 3.5-10000-1 OTIC suspension Place 4 drops into the right ear 3 (three) times daily. 05/27/17   Eustace Moore, MD  SUMAtriptan (IMITREX) 50 MG tablet Take one for headache.  May repeat in 2 hours.  No more than 2 a day 05/27/17   Eustace Moore, MD    Family History Family History  Problem Relation Age of Onset  . Cancer Father     Social History Social History   Tobacco Use  . Smoking status: Former Games developer  . Smokeless tobacco: Never Used  Substance Use Topics  . Alcohol use: No  . Drug use: No     Allergies   Cyclobenzaprine; Methocarbamol; Amoxicillin; and Penicillins   Review of Systems Review of Systems  Constitutional: Positive for activity change. Negative for chills and fever.  HENT: Positive for ear pain. Negative for ear discharge, postnasal drip, rhinorrhea and sore throat.   Eyes: Positive for photophobia. Negative for pain and visual disturbance.  Respiratory: Negative for cough and  shortness of breath.   Cardiovascular: Negative for chest pain and palpitations.  Gastrointestinal: Positive for nausea. Negative for abdominal pain and vomiting.  Genitourinary: Negative for dysuria and hematuria.  Musculoskeletal: Negative for arthralgias and back pain.  Skin: Negative for color change and rash.  Neurological: Positive for dizziness and headaches. Negative for seizures and syncope.  All other systems reviewed and are negative.    Physical Exam Triage Vital Signs ED Triage Vitals  Enc Vitals Group     BP 05/27/17 1817 109/67     Pulse Rate 05/27/17 1817 85     Resp 05/27/17 1817 16     Temp 05/27/17 1817 98.3 F (36.8 C)     Temp Source 05/27/17 1817 Oral     SpO2 05/27/17 1817 100 %     Weight 05/27/17  1845 201 lb (91.2 kg)     Height --      Head Circumference --      Peak Flow --      Pain Score 05/27/17 1845 7     Pain Loc --      Pain Edu? --      Excl. in GC? --    No data found.  Updated Vital Signs BP 109/67 (BP Location: Left Arm)   Pulse 85   Temp 98.3 F (36.8 C) (Oral)   Resp 16   Wt 201 lb (91.2 kg)   SpO2 100%   BMI 32.44 kg/m       Physical Exam  Constitutional: He is oriented to person, place, and time. He appears well-developed and well-nourished. He appears distressed.  uncomfortable  HENT:  Head: Normocephalic and atraumatic.  Left Ear: External ear normal.  Nose: Nose normal.  Mouth/Throat: Oropharynx is clear and moist.  Right external ear canal with some trauma and erythema.  No drainage.  No pain with traction of the pinna.  TM is clear.  Eyes: Pupils are equal, round, and reactive to light. Conjunctivae are normal.  Squinting, nystagmus, unable to see fundi due to cooperation.  No tenderness over temporal arteries  Neck: Normal range of motion. Neck supple. No thyromegaly present.  Cardiovascular: Normal rate, regular rhythm and normal heart sounds.  Pulmonary/Chest: Effort normal and breath sounds normal. No respiratory distress.  Abdominal: Soft. Bowel sounds are normal.  Musculoskeletal: Normal range of motion. He exhibits no edema.  Lymphadenopathy:    He has no cervical adenopathy.  Neurological: He is alert and oriented to person, place, and time. He displays normal reflexes. No cranial nerve deficit. Coordination normal.  Skin: Skin is warm and dry. No rash noted.  Psychiatric: He has a normal mood and affect. His behavior is normal. Thought content normal.     UC Treatments / Results  Labs (all labs ordered are listed, but only abnormal results are displayed) Labs Reviewed - No data to display  EKG None  Radiology No results found.  Procedures Procedures (including critical care time)  Medications Ordered in  UC Medications - No data to display  Initial Impression / Assessment and Plan / UC Course  I have reviewed the triage vital signs and the nursing notes.  Pertinent labs & imaging results that were available during my care of the patient were reviewed by me and considered in my medical decision making (see chart for details).    Discussed with patient that I do not think this is due to his ear.  His ear exam is normal.  The vertigo is inner ear  but that should be treated with the meclizine at the diazepam.  He is not currently having vertigo.  He is having headache photophobia and tearing from his eye.  This with the vertigo could certainly be vestibular migraines.  He is having atypical migraines if he is also having some numbness in his face and numbness in his right arm at times.  He does have a family doctor.  He advised to call his family doctor on Monday to see about a repeat visit, and also advised to call neurology to be seen for the atypical migraine.  With a normal CT and MRI, I think there is a low likelihood of anything serious going on.  Final Clinical Impressions(s) / UC Diagnoses   Final diagnoses:  Vestibular migraine     Discharge Instructions     Take the meclizine as needed dizziness Use ear drops for thi irritation in right ear Take the sumatriptan for the migraine headache Return if no better by ,Monday   ED Prescriptions    Medication Sig Dispense Auth. Provider   meclizine (ANTIVERT) 25 MG tablet Take 1 tablet (25 mg total) by mouth 3 (three) times daily as needed for up to 7 days for dizziness. 21 tablet Eustace Moore, MD   neomycin-polymyxin-hydrocortisone (CORTISPORIN) 3.5-10000-1 OTIC suspension Place 4 drops into the right ear 3 (three) times daily. 10 mL Eustace Moore, MD   SUMAtriptan (IMITREX) 50 MG tablet Take one for headache.  May repeat in 2 hours.  No more than 2 a day 10 tablet Eustace Moore, MD     Controlled Substance  Prescriptions [place] Controlled Substance Registry consulted? Not Applicable   Eustace Moore, MD 05/27/17 2106

## 2017-05-27 NOTE — Discharge Instructions (Addendum)
Take the meclizine as needed dizziness Use ear drops for thi irritation in right ear Take the sumatriptan for the migraine headache Return if no better by ,Monday

## 2017-08-25 ENCOUNTER — Ambulatory Visit (HOSPITAL_COMMUNITY)
Admission: EM | Admit: 2017-08-25 | Discharge: 2017-08-25 | Disposition: A | Discharge status: Home / Self Care | Payer: Self-pay | Attending: Family Medicine | Admitting: Family Medicine

## 2017-08-25 ENCOUNTER — Other Ambulatory Visit: Payer: Self-pay

## 2017-08-25 ENCOUNTER — Encounter (HOSPITAL_COMMUNITY): Payer: Self-pay | Admitting: Emergency Medicine

## 2017-08-25 DIAGNOSIS — H6122 Impacted cerumen, left ear: Secondary | ICD-10-CM

## 2017-08-25 NOTE — ED Triage Notes (Addendum)
Left ear pain for 3 days.  Denies cough or runny nose.    Patient has ear drops he received in 5/19 for right ear.  Has been using these drops in left ear

## 2017-08-25 NOTE — Discharge Instructions (Addendum)
We cleaned your left ear out. There is no sign of infection Follow-up as needed

## 2017-08-25 NOTE — ED Provider Notes (Signed)
[facility]    CSN: [id number] Arrival date & time: 08/25/17  0802     History   Chief Complaint Chief Complaint  Patient presents with  . Otalgia    [name] is a 47 y.o. male.   Pt is a 47 year old male that presents with left ear fullness and itching. This has been for 3 days. He denies any pain in the ear. He was treated for a right ear infection 3 days ago and has been using the same drops in the left ear. He denies any recent illness, or fever.   ROS per HPI      Past Medical History:  Diagnosis Date  . Asthma   . Herpes   . Shingles     Patient Active Problem List   Diagnosis Date Noted  . Asthma due to environmental allergies 12/20/2016  . HSV (herpes simplex virus) infection 02/24/2015  . Anxiety 04/02/2014    History reviewed. No pertinent surgical history.     Home Medications    Prior to Admission medications   Medication Sig Start Date End Date Taking? Authorizing Provider  neomycin-polymyxin-hydrocortisone (CORTISPORIN) 3.5-10000-1 OTIC suspension Place 4 drops into the right ear 3 (three) times daily. 05/27/17  Yes Eustace MooreNelson, Yvonne Sue, MD  cetirizine (ZYRTEC ALLERGY) 10 MG tablet Take 1 tablet (10 mg total) by mouth daily. 05/20/17   Wallis BambergMani, Mario, PA-C  SUMAtriptan (IMITREX) 50 MG tablet Take one for headache.  May repeat in 2 hours.  No more than 2 a day 05/27/17   Eustace MooreNelson, Yvonne Sue, MD    Family History Family History  Problem Relation Age of Onset  . Cancer Father     Social History Social History   Tobacco Use  . Smoking status: Former Games developermoker  . Smokeless tobacco: Never Used  Substance Use Topics  . Alcohol use: No  . Drug use: No     Allergies   Cyclobenzaprine; Methocarbamol; Amoxicillin; and Penicillins   Review of Systems Review of Systems   Physical Exam Triage Vital Signs ED Triage Vitals  Enc Vitals Group     BP 08/25/17 0823 107/66     Pulse Rate 08/25/17 0823 81     Resp 08/25/17 0823 16     Temp 08/25/17 0823 98.4 F (36.9 C)     Temp Source 08/25/17 0823 Oral     SpO2 08/25/17 0823 100 %     Weight --      Height --      Head Circumference --      Peak Flow --      Pain Score 08/25/17 0828 4     Pain Loc --      Pain Edu? --      Excl. in GC? --    No data found.  Updated Vital Signs BP 107/66 (BP Location: Left Arm)   Pulse 81   Temp 98.4 F (36.9 C) (Oral)   Resp 16   SpO2 100%   Visual Acuity Right Eye Distance:   Left Eye Distance:   Bilateral Distance:    Right Eye Near:   Left Eye Near:    Bilateral Near:     Physical Exam  Constitutional: He is oriented to person, place, and time. He appears well-developed and well-nourished.  HENT:  Head: Normocephalic and atraumatic.  Left ear cerumen impaction. Right TM visible and normal.   Eyes: Conjunctivae are normal.  Pulmonary/Chest: Effort normal.  Lymphadenopathy:    He  has no cervical adenopathy.  Neurological: He is alert and oriented to person, place, and time.  Skin: Skin is warm and dry.  Psychiatric: He has a normal mood and affect.  Nursing note and vitals reviewed.    UC Treatments / Results  Labs (all labs ordered are listed, but only abnormal results are displayed) Labs Reviewed - No data to display  EKG None  Radiology No results found.  Procedures Procedures (including critical care time)  Medications Ordered in UC Medications - No data to display  Initial Impression / Assessment and Plan / UC Course  I have reviewed the triage vital signs and the nursing notes.  Pertinent labs & imaging results that were available during my care of the patient were reviewed by me and considered in my medical decision making (see chart for details).     Ear wax removal of the left ear. Left TM visualized and normal. No treatment needed at this time. Follow up as needed.  Final Clinical Impressions(s) / UC Diagnoses   Final diagnoses:  Impacted cerumen of left ear      Discharge Instructions     We cleaned your left ear out. There is no sign of infection Follow-up as needed    ED Prescriptions    None     Controlled Substance Prescriptions [place] Controlled Substance Registry consulted? Not Applicable   Janace Aris, NP 08/25/17 [phone]

## 2017-10-05 ENCOUNTER — Ambulatory Visit: Payer: Self-pay | Attending: Family Medicine

## 2017-10-31 ENCOUNTER — Ambulatory Visit: Payer: Self-pay | Admitting: Nurse Practitioner

## 2018-01-25 ENCOUNTER — Ambulatory Visit: Payer: Self-pay | Attending: Family Medicine | Admitting: Physician Assistant

## 2018-01-25 VITALS — BP 114/74 | HR 79 | Temp 98.8°F | Resp 16 | Ht 66.0 in | Wt 197.2 lb

## 2018-01-25 DIAGNOSIS — Z131 Encounter for screening for diabetes mellitus: Secondary | ICD-10-CM

## 2018-01-25 DIAGNOSIS — M7989 Other specified soft tissue disorders: Secondary | ICD-10-CM | POA: Insufficient documentation

## 2018-01-25 DIAGNOSIS — S81801A Unspecified open wound, right lower leg, initial encounter: Secondary | ICD-10-CM | POA: Insufficient documentation

## 2018-01-25 DIAGNOSIS — Z789 Other specified health status: Secondary | ICD-10-CM

## 2018-01-25 DIAGNOSIS — H9193 Unspecified hearing loss, bilateral: Secondary | ICD-10-CM | POA: Insufficient documentation

## 2018-01-25 DIAGNOSIS — Z880 Allergy status to penicillin: Secondary | ICD-10-CM | POA: Insufficient documentation

## 2018-01-25 DIAGNOSIS — Z79899 Other long term (current) drug therapy: Secondary | ICD-10-CM | POA: Insufficient documentation

## 2018-01-25 DIAGNOSIS — Z87891 Personal history of nicotine dependence: Secondary | ICD-10-CM | POA: Insufficient documentation

## 2018-01-25 DIAGNOSIS — Z833 Family history of diabetes mellitus: Secondary | ICD-10-CM | POA: Insufficient documentation

## 2018-01-25 DIAGNOSIS — E876 Hypokalemia: Secondary | ICD-10-CM | POA: Insufficient documentation

## 2018-01-25 DIAGNOSIS — R059 Cough, unspecified: Secondary | ICD-10-CM

## 2018-01-25 DIAGNOSIS — R202 Paresthesia of skin: Secondary | ICD-10-CM | POA: Insufficient documentation

## 2018-01-25 DIAGNOSIS — J45909 Unspecified asthma, uncomplicated: Secondary | ICD-10-CM | POA: Insufficient documentation

## 2018-01-25 DIAGNOSIS — Z888 Allergy status to other drugs, medicaments and biological substances status: Secondary | ICD-10-CM | POA: Insufficient documentation

## 2018-01-25 DIAGNOSIS — R05 Cough: Secondary | ICD-10-CM

## 2018-01-25 DIAGNOSIS — Z882 Allergy status to sulfonamides status: Secondary | ICD-10-CM | POA: Insufficient documentation

## 2018-01-25 NOTE — Progress Notes (Signed)
Pt states he feels like his hands and feet get swollen  Pt states loud nose bothers him

## 2018-01-25 NOTE — Progress Notes (Signed)
Patient ID: Gabriel Ball, male   DOB: 11-Mar-1970, 48 y.o.   MRN: [id number]     Gabriel Ball, is a 48 y.o. male  UMP:[id number]  QMG:[id number]  DOB - 25-Sep-1970  Subjective:  Chief Complaint and HPI: Gabriel Ball is a 48 y.o. male here today with Gabriel Ball with [employer] interpre[employer] translating.  He has several complaints.  He is concerned he may have asthma due to lingering cough.  Former smoker.  Some reflux.  No fever.  Cough lingering for months after being told he might have asthma.  No fever.  No mucus.  Cough is dry and worse at night.  Mild in nature.   Hands feeling swollen last several days/maybe a week.  No pain.  No weakness.  Some tingling, not worse at night.    Ears/loud noises bothering him for about 6 months after cutting some steel rods.  Hearing has been distorted ever since.  It is painful to his ears with lod noises.    Also c/o of a slow-healing wound on his R shin and concerned he may have diabetes.  Glucose was normal within the last year.  Tdap 2017  FH + DM  ROS:   Constitutional:  No f/c, No night sweats, No unexplained weight loss. EENT:  No vision changes, No blurry vision, No hearing changes. No mouth or Throat problems Respiratory: + cough, No SOB Cardiac: No CP, no palpitations GI:  No abd pain, No N/V/D. GU: No Urinary s/sx Musculoskeletal: No joint pain Neuro: No headache, no dizziness, no motor weakness.  Skin: No rash Endocrine:  No polydipsia. No polyuria.  Psych: Denies SI/HI  No problems updated.  ALLERGIES: Allergies  Allergen Reactions  . Cyclobenzaprine     Facial Numbness/dizziness/hypersomnolence  . Methocarbamol     Abdominal pain  . Amoxicillin Itching    Pruritic rash  . Penicillins Palpitations    Has patient had a PCN reaction causing immediate rash, facial/tongue/throat swelling, SOB or lightheadedness with hypotension: No Has patient had a PCN reaction causing severe rash involving mucus membranes or skin necrosis: No Has  patient had a PCN reaction that required hospitalization: No Has patient had a PCN reaction occurring within the last 10 years: No If all of the above answers are "NO", then may proceed with Cephalosporin use.    PAST MEDICAL HISTORY: Past Medical History:  Diagnosis Date  . Asthma   . Herpes   . Shingles     MEDICATIONS AT HOME: Prior to Admission medications   Medication Sig Start Date End Date Taking? Authorizing Provider  cetirizine (ZYRTEC ALLERGY) 10 MG tablet Take 1 tablet (10 mg total) by mouth daily. 05/20/17   Wallis Bamberg, PA-C  neomycin-polymyxin-hydrocortisone (CORTISPORIN) 3.5-10000-1 OTIC suspension Place 4 drops into the right ear 3 (three) times daily. 05/27/17   Eustace Moore, MD  SUMAtriptan (IMITREX) 50 MG tablet Take one for headache.  May repeat in 2 hours.  No more than 2 a day 05/27/17   Eustace Moore, MD     Objective:  EXAM:   Vitals:   01/25/18 0822  BP: 114/74  Pulse: 79  Resp: 16  Temp: 98.8 F (37.1 C)  TempSrc: Oral  SpO2: 98%  Weight: 197 lb 3.2 oz (89.4 kg)  Height: 5\' 6"  (1.676 m)    General appearance : A&OX3. NAD. Non-toxic-appearing HEENT: Atraumatic and Normocephalic.  PERRLA. EOM intact.  TM clear B. Mouth-MMM, post pharynx WNL w/o erythema, No PND. Neck: supple, no JVD. No cervical  lymphadenopathy. No thyromegaly Chest/Lungs:  Breathing-non-labored, Good air entry bilaterally, breath sounds normal without rales, rhonchi, or wheezing  CVS: S1 S2 regular, no murmurs, gallops, rubs  B hands with normal S&ROM.  Strong grip strength.  Neg tinel's and phalen's.  R shin with scab over mid-tibia; appears to be healing normally Extremities: Bilateral Lower Ext shows no edema, both legs are warm to touch with = pulse throughout Neurology:  CN II-XII grossly intact, Non focal.   Psych:  TP linear. J/I WNL. Normal speech. Appropriate eye contact and affect.  Skin:  No Rash  Data Review Lab Results  Component Value Date   HGBA1C  5.1 12/31/2015   HGBA1C 5.10 10/31/2014     Assessment & Plan   1. Hearing problem of both ears No visible abnormality - Ambulatory referral to ENT  2. Paresthesias/"swollen hands" - TSH - Vitamin D, 25-hydroxy  3. Wound of right lower extremity, initial encounter Appears to be healing well-area slow to heal overall on anyone, but there is no sign of infection - CBC with Differential/Platelet  4. Cough H. pylori  5. Hypokalemia Noticed on lab review - Comprehensive metabolic panel - Vitamin D, 25-hydroxy  6. Language barrier [employer] interpre[employer] used and additional time performing visit was required.      Patient have been counseled extensively about nutrition and exercise  Return in about 2 months (around 03/26/2018) for Zelda; follow up on complaints.  The patient was given clear instructions to go to ER or return to medical center if symptoms don't improve, worsen or new problems develop. The patient verbalized understanding. The patient was told to call to get lab results if they haven't heard anything in the next week.     Georgian Co, PA-C [facility] [place], [place] [phone]   01/25/2018, 8:46 AM

## 2018-01-26 LAB — COMPREHENSIVE METABOLIC PANEL
ALT: 22 IU/L (ref 0–44)
AST: 19 IU/L (ref 0–40)
Albumin/Globulin Ratio: 2.1 (ref 1.2–2.2)
Albumin: 4.8 g/dL (ref 3.5–5.5)
Alkaline Phosphatase: 89 IU/L (ref 39–117)
BUN/Creatinine Ratio: 13 (ref 9–20)
BUN: 10 mg/dL (ref 6–24)
Bilirubin Total: 0.6 mg/dL (ref 0.0–1.2)
CALCIUM: 9.3 mg/dL (ref 8.7–10.2)
CO2: 21 mmol/L (ref 20–29)
Chloride: 103 mmol/L (ref 96–106)
Creatinine, Ser: 0.75 mg/dL — ABNORMAL LOW (ref 0.76–1.27)
GFR, EST AFRICAN AMERICAN: 127 mL/min/{1.73_m2} (ref >59)
GFR, EST NON AFRICAN AMERICAN: 110 mL/min/{1.73_m2} (ref >59)
GLUCOSE: 94 mg/dL (ref 65–99)
Globulin, Total: 2.3 g/dL (ref 1.5–4.5)
Potassium: 4.1 mmol/L (ref 3.5–5.2)
Sodium: 140 mmol/L (ref 134–144)
Total Protein: 7.1 g/dL (ref 6.0–8.5)

## 2018-01-26 LAB — CBC WITH DIFFERENTIAL/PLATELET
Basophils Absolute: 0.1 10*3/uL (ref 0.0–0.2)
Basos: 1 %
EOS (ABSOLUTE): 0.2 10*3/uL (ref 0.0–0.4)
EOS: 3 %
HEMATOCRIT: 46.7 % (ref 37.5–51.0)
Hemoglobin: 16.1 g/dL (ref 13.0–17.7)
IMMATURE GRANULOCYTES: 1 %
Immature Grans (Abs): 0 10*3/uL (ref 0.0–0.1)
LYMPHS: 31 %
Lymphocytes Absolute: 2.1 10*3/uL (ref 0.7–3.1)
MCH: 29.1 pg (ref 26.6–33.0)
MCHC: 34.5 g/dL (ref 31.5–35.7)
MCV: 84 fL (ref 79–97)
MONOCYTES: 7 %
MONOS ABS: 0.5 10*3/uL (ref 0.1–0.9)
NEUTROS PCT: 57 %
Neutrophils Absolute: 3.8 10*3/uL (ref 1.4–7.0)
Platelets: 238 10*3/uL (ref 150–450)
RBC: 5.53 x10E6/uL (ref 4.14–5.80)
RDW: 13.3 % (ref 11.6–15.4)
WBC: 6.6 10*3/uL (ref 3.4–10.8)

## 2018-01-26 LAB — VITAMIN D 25 HYDROXY (VIT D DEFICIENCY, FRACTURES): Vit D, 25-Hydroxy: 11.7 ng/mL — ABNORMAL LOW (ref 30.0–100.0)

## 2018-01-26 LAB — TSH: TSH: 2.56 u[IU]/mL (ref 0.450–4.500)

## 2018-01-26 LAB — HEMOGLOBIN A1C
ESTIMATED AVERAGE GLUCOSE: 100 mg/dL
Hgb A1c MFr Bld: 5.1 % (ref 4.8–5.6)

## 2018-01-27 ENCOUNTER — Other Ambulatory Visit: Payer: Self-pay | Admitting: Physician Assistant

## 2018-01-27 LAB — H. PYLORI BREATH TEST: H pylori Breath Test: NEGATIVE

## 2018-01-27 MED ORDER — VITAMIN D (ERGOCALCIFEROL) 1.25 MG (50000 UNIT) PO CAPS: 50000.0000 [IU] | ORAL_CAPSULE | ORAL | 0 refills | Status: DC

## 2018-02-09 ENCOUNTER — Encounter (HOSPITAL_COMMUNITY): Payer: Self-pay | Admitting: Emergency Medicine

## 2018-02-09 ENCOUNTER — Ambulatory Visit (HOSPITAL_COMMUNITY)
Admission: EM | Admit: 2018-02-09 | Discharge: 2018-02-09 | Disposition: A | Discharge status: Home / Self Care | Payer: Self-pay | Attending: Internal Medicine | Admitting: Internal Medicine

## 2018-02-09 DIAGNOSIS — H60331 Swimmer's ear, right ear: Secondary | ICD-10-CM | POA: Insufficient documentation

## 2018-02-09 DIAGNOSIS — M7541 Impingement syndrome of right shoulder: Secondary | ICD-10-CM | POA: Insufficient documentation

## 2018-02-09 DIAGNOSIS — M25811 Other specified joint disorders, right shoulder: Secondary | ICD-10-CM

## 2018-02-09 MED ORDER — OFLOXACIN 0.3 % OP SOLN: OPHTHALMIC | 0 refills | Status: DC

## 2018-02-09 MED ORDER — MELOXICAM 7.5 MG PO TABS: 7.5000 mg | ORAL_TABLET | Freq: Every day | ORAL | 1 refills | Status: DC

## 2018-02-09 NOTE — ED Triage Notes (Signed)
Pt states "about 3 days ago I came down with pain in my right shoulder and right ear and it feels like there is pressure there". Pt states he lifted something heavy

## 2018-02-10 ENCOUNTER — Telehealth: Payer: Self-pay

## 2018-02-10 ENCOUNTER — Other Ambulatory Visit: Payer: Self-pay

## 2018-02-10 MED ORDER — VITAMIN D (ERGOCALCIFEROL) 1.25 MG (50000 UNIT) PO CAPS: 50000.0000 [IU] | ORAL_CAPSULE | ORAL | 0 refills | Status: DC

## 2018-02-10 NOTE — Telephone Encounter (Signed)
[employer] [place]  Id#  [id number] contacted pt to go over lab results pt didn't answer left a detailed vm informing pt of results and if he has any questions or concerns to give me a call

## 2018-02-14 ENCOUNTER — Ambulatory Visit (HOSPITAL_COMMUNITY)
Admission: EM | Admit: 2018-02-14 | Discharge: 2018-02-14 | Disposition: A | Discharge status: Home / Self Care | Payer: Self-pay | Attending: Emergency Medicine | Admitting: Emergency Medicine

## 2018-02-14 ENCOUNTER — Other Ambulatory Visit: Payer: Self-pay

## 2018-02-14 ENCOUNTER — Encounter (HOSPITAL_COMMUNITY): Payer: Self-pay | Admitting: Emergency Medicine

## 2018-02-14 DIAGNOSIS — H65111 Acute and subacute allergic otitis media (mucoid) (sanguinous) (serous), right ear: Secondary | ICD-10-CM | POA: Insufficient documentation

## 2018-02-14 MED ORDER — DOXYCYCLINE HYCLATE 100 MG PO CAPS: 100.0000 mg | ORAL_CAPSULE | Freq: Two times a day (BID) | ORAL | 0 refills | Status: DC

## 2018-02-14 NOTE — ED Provider Notes (Signed)
[facility]    CSN: [id number] Arrival date & time: 02/14/18  1029     History   Chief Complaint Chief Complaint  Patient presents with  . Dental Pain    [name] is a 48 y.o. male.   Spoke with pt using interp line. Pt is here for pain from rt ear into the gum line of the jaw. States that he was seen on 1/23 for this samething and was given ear drops. The area has not got any better if not worse. Denies any current fevers. Pt was scheduled to have an ENT referral but has not heard from them yet.      Past Medical History:  Diagnosis Date  . Asthma   . Herpes   . Shingles     Patient Active Problem List   Diagnosis Date Noted  . Asthma due to environmental allergies 12/20/2016  . HSV (herpes simplex virus) infection 02/24/2015  . Anxiety 04/02/2014    History reviewed. No pertinent surgical history.     Home Medications    Prior to Admission medications   Medication Sig Start Date End Date Taking? Authorizing Provider  cetirizine (ZYRTEC ALLERGY) 10 MG tablet Take 1 tablet (10 mg total) by mouth daily. Patient not taking: Reported on 02/09/2018 05/20/17   Wallis Bamberg, PA-C  doxycycline (VIBRAMYCIN) 100 MG capsule Take 1 capsule (100 mg total) by mouth 2 (two) times daily. 02/14/18   Coralyn Mark, NP  meloxicam (MOBIC) 7.5 MG tablet Take 1 tablet (7.5 mg total) by mouth daily. 1 pastilla al diaria si tiene Engineer, maintenance (IT). Toma con comida y much agua (Take with food and plenty of water) 02/09/18   Arnaldo Natal, MD  neomycin-polymyxin-hydrocortisone (CORTISPORIN) 3.5-10000-1 OTIC suspension Place 4 drops into the right ear 3 (three) times daily. Patient not taking: Reported on 02/09/2018 05/27/17   Eustace Moore, MD  ofloxacin (OCUFLOX) 0.3 % ophthalmic solution 1 gota quatro veces al dia en el oido derecho x4 dias (1 drop QID in the right ear x4 days) 02/09/18   Arnaldo Natal, MD  SUMAtriptan (IMITREX) 50 MG tablet Take one for  headache.  May repeat in 2 hours.  No more than 2 a day Patient not taking: Reported on 02/09/2018 05/27/17   Eustace Moore, MD  Vitamin D, Ergocalciferol, (DRISDOL) 1.25 MG (50000 UT) CAPS capsule Take 1 capsule (50,000 Units total) by mouth every 7 (seven) days. 02/10/18   Anders Simmonds, PA-C    Family History Family History  Problem Relation Age of Onset  . Cancer Father     Social History Social History   Tobacco Use  . Smoking status: Former Games developer  . Smokeless tobacco: Never Used  Substance Use Topics  . Alcohol use: No  . Drug use: No     Allergies   Cyclobenzaprine; Methocarbamol; Amoxicillin; and Penicillins   Review of Systems Review of Systems  Constitutional: Positive for appetite change.  HENT: Positive for dental problem and ear pain.   Eyes: Negative.   Respiratory: Negative.   Cardiovascular: Negative.   Gastrointestinal: Negative.   Skin: Negative.   Neurological: Negative.      Physical Exam Triage Vital Signs ED Triage Vitals  Enc Vitals Group     BP 02/14/18 1138 107/60     Pulse Rate 02/14/18 1138 70     Resp 02/14/18 1138 16     Temp 02/14/18 1138 98.6 F (37 C)  Temp Source 02/14/18 1138 Oral     SpO2 02/14/18 1138 98 %     Weight --      Height --      Head Circumference --      Peak Flow --      Pain Score 02/14/18 1148 8     Pain Loc --      Pain Edu? --      Excl. in GC? --    No data found.  Updated Vital Signs BP 107/60 (BP Location: Right Arm)   Pulse 70   Temp 98.6 F (37 C) (Oral)   Resp 16   SpO2 98%   Visual Acuity Right Eye Distance:   Left Eye Distance:   Bilateral Distance:    Right Eye Near:   Left Eye Near:    Bilateral Near:     Physical Exam HENT:     Right Ear: Decreased hearing noted. Tenderness present. There is mastoid tenderness. Tympanic membrane is erythematous and bulging.     Left Ear: Tympanic membrane normal.  Neck:     Musculoskeletal: Normal range of motion.     Trachea:  Trachea normal.  Cardiovascular:     Rate and Rhythm: Normal rate.     Pulses: Normal pulses.  Pulmonary:     Effort: Pulmonary effort is normal.     Breath sounds: Normal breath sounds.  Neurological:     Mental Status: He is alert.      UC Treatments / Results  Labs (all labs ordered are listed, but only abnormal results are displayed) Labs Reviewed - No data to display  EKG None  Radiology No results found.  Procedures Procedures (including critical care time)  Medications Ordered in UC Medications - No data to display  Initial Impression / Assessment and Plan / UC Course  I have reviewed the triage vital signs and the nursing notes.  Pertinent labs & imaging results that were available during my care of the patient were reviewed by me and considered in my medical decision making (see chart for details).     Discussed with interpt and pt that he will need to f/u with pcp and ent. Take nsaids as needed for pain  Take full dose of abx with food  Reviewed previous notes from pcp  Final Clinical Impressions(s) / UC Diagnoses   Final diagnoses:  Acute mucoid otitis media of right ear     Discharge Instructions     Discussed with interpt and pt that he will need to f/u with pcp and ent. Take nsaids as needed for pain  Take full dose of abx with food     ED Prescriptions    Medication Sig Dispense Auth. Provider   doxycycline (VIBRAMYCIN) 100 MG capsule Take 1 capsule (100 mg total) by mouth 2 (two) times daily. 20 capsule Coralyn MarkMitchell,  L, NP     Controlled Substance Prescriptions [place] Controlled Substance Registry consulted? Not Applicable   Coralyn MarkMitchell,  L, NP 02/14/18 1158

## 2018-02-14 NOTE — ED Triage Notes (Signed)
Gabriel Orleans, NP at bedside with translator services.    Last Thursday diagnosed with ear infection.  Now my cheek, neck and shoulder is hurting.

## 2018-02-14 NOTE — Discharge Instructions (Addendum)
Discussed with interpt and pt that he will need to f/u with pcp and ent. Take nsaids as needed for pain  Take full dose of abx with food

## 2018-02-17 ENCOUNTER — Other Ambulatory Visit: Payer: Self-pay

## 2018-02-17 ENCOUNTER — Emergency Department (HOSPITAL_COMMUNITY)
Admission: EM | Admit: 2018-02-17 | Discharge: 2018-02-17 | Disposition: A | Discharge status: Home / Self Care | Payer: Self-pay | Attending: Emergency Medicine | Admitting: Emergency Medicine

## 2018-02-17 ENCOUNTER — Encounter (HOSPITAL_COMMUNITY): Payer: Self-pay | Admitting: Emergency Medicine

## 2018-02-17 DIAGNOSIS — J45909 Unspecified asthma, uncomplicated: Secondary | ICD-10-CM | POA: Insufficient documentation

## 2018-02-17 DIAGNOSIS — R202 Paresthesia of skin: Secondary | ICD-10-CM

## 2018-02-17 DIAGNOSIS — F419 Anxiety disorder, unspecified: Secondary | ICD-10-CM

## 2018-02-17 DIAGNOSIS — M25511 Pain in right shoulder: Secondary | ICD-10-CM

## 2018-02-17 DIAGNOSIS — Z79899 Other long term (current) drug therapy: Secondary | ICD-10-CM | POA: Insufficient documentation

## 2018-02-17 DIAGNOSIS — Z87891 Personal history of nicotine dependence: Secondary | ICD-10-CM | POA: Insufficient documentation

## 2018-02-17 MED ORDER — HYDROXYZINE HCL 50 MG PO TABS: 25.0000 mg | ORAL_TABLET | Freq: Four times a day (QID) | ORAL | 0 refills | Status: DC | PRN

## 2018-02-17 NOTE — ED Notes (Signed)
Discharge instructions and prescription discussed with Pt. Pt verbalized understanding. Pt stable and ambulatory.    

## 2018-02-17 NOTE — ED Provider Notes (Signed)
[facility] Provider Note   CSN: [id number] Arrival date & time: 02/17/18  1634     History   Chief Complaint Chief Complaint  Patient presents with  . Otalgia  . Facial Pain    [name] is a 48 y.o. male with past medical history of asthma, anxiety, who presents today for evaluation of right sided head, neck, arm, chest, and proximal lower leg tingling.  This is been going on for 15 days.  He reports that he feels like this is due to his nerves and anxiety.  He is requesting a medication that he was given 3 years ago however does not know what that medicine was.  Chart review shows that he has been seen recently for right ear pain, was diagnosed with otitis media and otitis externa.  He reports compliance with those antibiotics and that his ear pain is better.  He denies any headache or dizziness. No weakness.   History obtained through [employer] video interpreter.  Chart review shows that patient has been seen many times for right-sided facial abnormal sensation since 2015.    Note from 04/2013 urgent care says that patient has been seen multiple times for the same complaint with multiple visits in the past 2 years, including the sensation radiating into his right arm.  Since he has had similar symptoms in the past, he has had at least 2 CT heads and a MRI, most recently 04/2017, that did not show acute abnormalities or evidence of stroke.   He verbalizes increased stress at work.  He has been taking his daughter to see a counselor and says that he has a plans to see the counselor.  He reports feeling very anxious and stressed, talking about how other people do not do their jobs and spend too much time on their phones.     HPI  Past Medical History:  Diagnosis Date  . Asthma   . Herpes   . Shingles     Patient Active Problem List   Diagnosis Date Noted  . Asthma due to environmental allergies 12/20/2016  . HSV (herpes simplex  virus) infection 02/24/2015  . Anxiety 04/02/2014    History reviewed. No pertinent surgical history.      Home Medications    Prior to Admission medications   Medication Sig Start Date End Date Taking? Authorizing Provider  cetirizine (ZYRTEC ALLERGY) 10 MG tablet Take 1 tablet (10 mg total) by mouth daily. Patient not taking: Reported on 02/09/2018 05/20/17   Wallis BambergMani, Mario, PA-C  doxycycline (VIBRAMYCIN) 100 MG capsule Take 1 capsule (100 mg total) by mouth 2 (two) times daily. 02/14/18   Coralyn MarkMitchell, Melanie L, NP  hydrOXYzine (ATARAX/VISTARIL) 50 MG tablet Take 0.5 tablets (25 mg total) by mouth every 6 (six) hours as needed for anxiety. 02/17/18   Cristina GongHammond, Antoinette Haskett W, PA-C  meloxicam (MOBIC) 7.5 MG tablet Take 1 tablet (7.5 mg total) by mouth daily. 1 pastilla al diaria si tiene Engineer, maintenance (IT)dolor en el brazo. Toma con comida y much agua (Take with food and plenty of water) 02/09/18   Arnaldo Nataliamond, Michael S, MD  neomycin-polymyxin-hydrocortisone (CORTISPORIN) 3.5-10000-1 OTIC suspension Place 4 drops into the right ear 3 (three) times daily. Patient not taking: Reported on 02/09/2018 05/27/17   Eustace MooreNelson, Yvonne Sue, MD  ofloxacin (OCUFLOX) 0.3 % ophthalmic solution 1 gota quatro veces al dia en el oido derecho x4 dias (1 drop QID in the right ear x4 days) 02/09/18   Arnaldo Nataliamond, Michael S,  MD  SUMAtriptan (IMITREX) 50 MG tablet Take one for headache.  May repeat in 2 hours.  No more than 2 a day Patient not taking: Reported on 02/09/2018 05/27/17   Eustace Moore, MD  Vitamin D, Ergocalciferol, (DRISDOL) 1.25 MG (50000 UT) CAPS capsule Take 1 capsule (50,000 Units total) by mouth every 7 (seven) days. 02/10/18   Anders Simmonds, PA-C    Family History Family History  Problem Relation Age of Onset  . Cancer Father     Social History Social History   Tobacco Use  . Smoking status: Former Games developer  . Smokeless tobacco: Never Used  Substance Use Topics  . Alcohol use: No  . Drug use: No     Allergies     Cyclobenzaprine; Methocarbamol; Amoxicillin; and Penicillins   Review of Systems Review of Systems  Constitutional: Negative for chills and fever.  Neurological: Negative for dizziness, facial asymmetry, weakness, numbness and headaches.       Tingling  All other systems reviewed and are negative.    Physical Exam Updated Vital Signs BP 117/65   Pulse 68   Temp 98.1 F (36.7 C) (Oral)   Resp 16   Ht 5\' 5"  (1.651 m)   Wt 89.8 kg   SpO2 97%   BMI 32.95 kg/m   Physical Exam Vitals signs and nursing note reviewed.  Constitutional:      General: He is not in acute distress.    Appearance: He is well-developed. He is not diaphoretic.  HENT:     Head: Normocephalic and atraumatic.     Right Ear: Tympanic membrane, ear canal and external ear normal.     Left Ear: Tympanic membrane, ear canal and external ear normal.     Ears:     Comments: No mastoid redness or tenderness to palpation.  No motion tenderness of bilateral ears.    Mouth/Throat:     Mouth: Mucous membranes are moist.  Eyes:     General: No scleral icterus.       Right eye: No discharge.        Left eye: No discharge.     Conjunctiva/sclera: Conjunctivae normal.  Neck:     Musculoskeletal: Normal range of motion and neck supple. No neck rigidity or muscular tenderness.  Cardiovascular:     Rate and Rhythm: Normal rate and regular rhythm.     Pulses: Normal pulses.     Heart sounds: Normal heart sounds.     Comments: 2+ radial pulses bilaterally. Pulmonary:     Effort: Pulmonary effort is normal. No respiratory distress.     Breath sounds: Normal breath sounds. No stridor.  Abdominal:     General: There is no distension.     Tenderness: There is no abdominal tenderness.  Musculoskeletal:        General: No deformity.  Skin:    General: Skin is warm and dry.  Neurological:     General: No focal deficit present.     Mental Status: He is alert. Mental status is at baseline.     Motor: No abnormal  muscle tone.     Comments: Mental Status:  Alert, oriented, thought content appropriate, able to give a coherent history. Speech fluent without evidence of aphasia. Able to follow 2 step commands without difficulty.  Cranial Nerves:  II:  Peripheral visual fields grossly normal, pupils equal, round, reactive to light III,IV, VI: ptosis not present, extra-ocular motions intact bilaterally  V,VII: smile symmetric, facial light touch  sensation is diffusely decreased on the right side. VIII: hearing grossly normal to voice  X: uvula elevates symmetrically  XI: bilateral shoulder shrug symmetric and strong XII: midline tongue extension without fassiculations Motor:  Normal tone. 5/5 in upper and lower extremities bilaterally including strong and equal grip strength and dorsiflexion/plantar flexion.  5/5 bilateral hip flexion/extension, knee flexion/extension, and flexion/extension at bilateral elbows. Cerebellar: normal finger-to-nose with bilateral upper extremities Gait: normal gait and balance CV: distal pulses palpable throughout  On the entire right side of face, head, neck, shoulder there is subjective decreased sensation from the midline out.  This subjective decreased sensation extends to the wrist where it abruptly stops and is normal in the hand on the right side.  He has subjective decreased sensation from the level of the axilla to the midline both anteriorly and posteriorly on the right side. He has subjective decreased sensation to light touch on the right sided lower thigh both medially and laterally directly above the knee.  Sensation to right upper thigh is intact to light touch.  Psychiatric:        Mood and Affect: Mood is anxious.        Behavior: Behavior is agitated.      ED Treatments / Results  Labs (all labs ordered are listed, but only abnormal results are displayed) Labs Reviewed - No data to display  EKG None  Radiology No results  found.  Procedures Procedures (including critical care time)  Medications Ordered in ED Medications - No data to display   Initial Impression / Assessment and Plan / ED Course  I have reviewed the triage vital signs and the nursing notes.  Pertinent labs & imaging results that were available during my care of the patient were reviewed by me and considered in my medical decision making (see chart for details).  Clinical Course as of Feb 18 2148  Fri Feb 17, 2018  [id number] Attempted to offer patient migraine cocktail.  He refused to this an additional evaluation, he is requesting discharge home.   [EH]    Clinical Course User Index [EH] Cristina GongHammond, Lien Lyman W, PA-C   Patient presents today for evaluation of tingling in the right side of his face, head, shoulder, right arm to wrist.  Chart review shows that he has been seen for similar symptoms multiple times in the past.  He has had multiple CT heads, and a MRI after having similar symptoms in the past without evidence of abnormality or cause for his symptoms.  On exam he does not have any weakness, he does not have any true numbness and is able to feel however has subjective decreased sensation to light touch.  Reassured by chart review showing multiple visits for similar with previous MRI and CT without cause for his symptoms found.  Given the recurrent nature do not suspect dissection.  He has intact circulatory function and his right arm is warm and well perfused.  He is neurologically intact on exam other than the decreased subjective sensation to light touch.  Chart review does show that patient has a history of vestibular migraines, I offered to treat his symptoms with a migraine cocktail, however patient refused stating this is not a headache.    Given that his symptoms include both upper and lower extremity, I do not suspect a intrathoracic or intra-abdominal cause for his symptoms.  Given that the areas involved include multiple  dermatomes, including face, neck, arm, and leg I do not suspect an acutely life-threatening  cause for his condition.   He requested treatment to help his anxiety.  He is given prescription for as needed Atarax.  He was instructed that this medication may make him drowsy and sleepy and he should not drive, operate heavy machinery, or perform other potentially dangerous tasks.  Recommended following up with PCP for  anxiety medication, along with counseling for his anxiety.  Return precautions were discussed with patient who states their understanding.  At the time of discharge patient denied any unaddressed complaints or concerns.  Patient is agreeable for discharge home.   Final Clinical Impressions(s) / ED Diagnoses   Final diagnoses:  Facial tingling sensation  Acute pain of right shoulder  Anxiety    ED Discharge Orders         Ordered    hydrOXYzine (ATARAX/VISTARIL) 50 MG tablet  Every 6 hours PRN     02/17/18 1832           Norman Clay 02/17/18 2157    Tegeler, Canary Brim, MD 02/18/18 1110

## 2018-02-17 NOTE — ED Triage Notes (Addendum)
Pt to ED with c/o right ear pain and pain in right side of face.  Pt was given meds for ear infection 3 days ago but st's it isn't any better.  Pt st's he thinks he needs something for anxiety.  Pt triaged using [employer] Interpreter

## 2018-02-17 NOTE — Discharge Instructions (Signed)
Please see your primary care doctor

## 2018-03-01 NOTE — Progress Notes (Deleted)
Patient ID: Gabriel Ball, male   DOB: 08-24-1970, 48 y.o.   MRN: [id number]   After being seen in the ED 02/17/2018. From ED note: Gabriel Ball is a 48 y.o. male with past medical history of asthma, anxiety, who presents today for evaluation of right sided head, neck, arm, chest, and proximal lower leg tingling.  This is been going on for 15 days.  He reports that he feels like this is due to his nerves and anxiety.  He is requesting a medication that he was given 3 years ago however does not know what that medicine was.  Chart review shows that he has been seen recently for right ear pain, was diagnosed with otitis media and otitis externa.  He reports compliance with those antibiotics and that his ear pain is better.  Chart review shows that patient has been seen many times for right-sided facial abnormal sensation since 2015.    Note from 04/2013 urgent care says that patient has been seen multiple times for the same complaint with multiple visits in the past 2 years, including the sensation radiating into his right arm.  Since he has had similar symptoms in the past, he has had at least 2 CT heads and a MRI, most recently 04/2017, that did not show acute abnormalities or evidence of stroke.   He verbalizes increased stress at work.  He has been taking his daughter to see a counselor and says that he has a plans to see the counselor.  He reports feeling very anxious and stressed, talking about how other people do not do their jobs and spend too much time on their phones.   From A/P: Patient presents today for evaluation of tingling in the right side of his face, head, shoulder, right arm to wrist.  Chart review shows that he has been seen for similar symptoms multiple times in the past.  He has had multiple CT heads, and a MRI after having similar symptoms in the past without evidence of abnormality or cause for his symptoms.  On exam he does not have any weakness, he does not have any true numbness  and is able to feel however has subjective decreased sensation to light touch.  Reassured by chart review showing multiple visits for similar with previous MRI and CT without cause for his symptoms found.  Given the recurrent nature do not suspect dissection.  He has intact circulatory function and his right arm is warm and well perfused.  He is neurologically intact on exam other than the decreased subjective sensation to light touch.  Chart review does show that patient has a history of vestibular migraines, I offered to treat his symptoms with a migraine cocktail, however patient refused stating this is not a headache.    Given that his symptoms include both upper and lower extremity, I do not suspect a intrathoracic or intra-abdominal cause for his symptoms.  Given that the areas involved include multiple dermatomes, including face, neck, arm, and leg I do not suspect an acutely life-threatening cause for his condition.   He requested treatment to help his anxiety.  He is given prescription for as needed Atarax.  He was instructed that this medication may make him drowsy and sleepy and he should not drive, operate heavy machinery, or perform other potentially dangerous tasks.  Recommended following up with PCP for  anxiety medication, along with counseling for his anxiety.  Return precautions were discussed with patient who states their understanding.  At the time  of discharge patient denied any unaddressed complaints or concerns.  Patient is agreeable for discharge home.

## 2018-03-02 ENCOUNTER — Inpatient Hospital Stay: Payer: Self-pay

## 2018-04-25 ENCOUNTER — Encounter: Payer: Self-pay | Admitting: Nurse Practitioner

## 2018-04-25 ENCOUNTER — Other Ambulatory Visit: Payer: Self-pay

## 2018-04-25 ENCOUNTER — Ambulatory Visit: Payer: Self-pay | Attending: Nurse Practitioner | Admitting: Nurse Practitioner

## 2018-04-25 DIAGNOSIS — A6001 Herpesviral infection of penis: Secondary | ICD-10-CM

## 2018-04-25 DIAGNOSIS — H6691 Otitis media, unspecified, right ear: Secondary | ICD-10-CM

## 2018-04-25 MED ORDER — OFLOXACIN 0.3 % OP SOLN: OPHTHALMIC | 0 refills | Status: DC

## 2018-04-25 MED ORDER — FLUTICASONE PROPIONATE 50 MCG/ACT NA SUSP: 2.0000 | Freq: Every day | NASAL | 6 refills | Status: DC

## 2018-04-25 MED ORDER — CETIRIZINE HCL 10 MG PO TABS: 10.0000 mg | ORAL_TABLET | Freq: Every day | ORAL | 11 refills | Status: DC

## 2018-04-25 MED ORDER — ACYCLOVIR 400 MG PO TABS: 400.0000 mg | ORAL_TABLET | Freq: Three times a day (TID) | ORAL | 1 refills | Status: AC

## 2018-04-25 NOTE — Progress Notes (Signed)
Assessment & Plan:  Gabriel Ball was seen today for ear pain.  Diagnoses and all orders for this visit:  Chronic otitis media of right ear -     cetirizine (ZYRTEC ALLERGY) 10 MG tablet; Take 1 tablet (10 mg total) by mouth daily. -     fluticasone (FLONASE) 50 MCG/ACT nasal spray; Place 2 sprays into both nostrils daily. -     ofloxacin (OCUFLOX) 0.3 % ophthalmic solution; 1 gota quatro veces al dia en el oido derecho x4 dias (1 drop QID in the right ear x4 days) -     Ambulatory referral to ENT  Herpes simplex infection of penis -     acyclovir (ZOVIRAX) 400 MG tablet; Take 1 tablet (400 mg total) by mouth 3 (three) times daily for 5 days.    Patient has been counseled on age-appropriate routine health concerns for screening and prevention. These are reviewed and up-to-date. Referrals have been placed accordingly. Immunizations are up-to-date or declined.    Subjective:   Chief Complaint  Patient presents with  . Ear Pain    Pt. stated he is having right ear pain.   [name] 48 y.o. male presents for telehealth appointment   RIGHT EAR PAIN Pain in the right ear and right jaw.  He was referred to ENT however states he was told that the office would not have an onsite interpreter and therefore he had to cancel his appointment. He has been seen and treated in the ED several times for right ear infection. He also has a history of bilateral impacted cerumen and hearing difficulties. He denies any purulent drainage coming from the ear.     Review of Systems  Constitutional: Negative for fever, malaise/fatigue and weight loss.  HENT: Positive for ear pain and hearing loss. Negative for congestion, ear discharge, nosebleeds, sinus pain, sore throat and tinnitus.   Eyes: Negative.  Negative for blurred vision, double vision and photophobia.  Respiratory: Negative.  Negative for cough and shortness of breath.   Cardiovascular: Negative.  Negative for chest pain, palpitations and leg  swelling.  Gastrointestinal: Negative.  Negative for heartburn, nausea and vomiting.  Musculoskeletal: Negative.  Negative for myalgias.  Neurological: Negative.  Negative for dizziness, focal weakness, seizures and headaches.  Psychiatric/Behavioral: Negative.  Negative for suicidal ideas.    Past Medical History:  Diagnosis Date  . Asthma   . Herpes   . Shingles     History reviewed. No pertinent surgical history.  Family History  Problem Relation Age of Onset  . Cancer Father     Social History Reviewed with no changes to be made today.   Outpatient Medications Prior to Visit  Medication Sig Dispense Refill  . doxycycline (VIBRAMYCIN) 100 MG capsule Take 1 capsule (100 mg total) by mouth 2 (two) times daily. 20 capsule 0  . hydrOXYzine (ATARAX/VISTARIL) 50 MG tablet Take 0.5 tablets (25 mg total) by mouth every 6 (six) hours as needed for anxiety. 15 tablet 0  . Vitamin D, Ergocalciferol, (DRISDOL) 1.25 MG (50000 UT) CAPS capsule Take 1 capsule (50,000 Units total) by mouth every 7 (seven) days. 16 capsule 0  . ofloxacin (OCUFLOX) 0.3 % ophthalmic solution 1 gota quatro veces al dia en el oido derecho x4 dias (1 drop QID in the right ear x4 days) 5 mL 0  . meloxicam (MOBIC) 7.5 MG tablet Take 1 tablet (7.5 mg total) by mouth daily. 1 pastilla al diaria si tiene Engineer, maintenance (IT)dolor en el brazo. Toma con  comida y much agua (Take with food and plenty of water) (Patient not taking: Reported on 04/25/2018) 15 tablet 1  . neomycin-polymyxin-hydrocortisone (CORTISPORIN) 3.5-10000-1 OTIC suspension Place 4 drops into the right ear 3 (three) times daily. (Patient not taking: Reported on 02/09/2018) 10 mL 0  . SUMAtriptan (IMITREX) 50 MG tablet Take one for headache.  May repeat in 2 hours.  No more than 2 a day (Patient not taking: Reported on 02/09/2018) 10 tablet 0  . cetirizine (ZYRTEC ALLERGY) 10 MG tablet Take 1 tablet (10 mg total) by mouth daily. (Patient not taking: Reported on 02/09/2018) 30 tablet  11   No facility-administered medications prior to visit.     Allergies  Allergen Reactions  . Cyclobenzaprine     Facial Numbness/dizziness/hypersomnolence  . Methocarbamol     Abdominal pain  . Amoxicillin Itching    Pruritic rash  . Penicillins Palpitations    Has patient had a PCN reaction causing immediate rash, facial/tongue/throat swelling, SOB or lightheadedness with hypotension: No Has patient had a PCN reaction causing severe rash involving mucus membranes or skin necrosis: No Has patient had a PCN reaction that required hospitalization: No Has patient had a PCN reaction occurring within the last 10 years: No If all of the above answers are "NO", then may proceed with Cephalosporin use.       Objective:    There were no vitals taken for this visit. Wt Readings from Last 3 Encounters:  02/17/18 198 lb (89.8 kg)  01/25/18 197 lb 3.2 oz (89.4 kg)  05/27/17 201 lb (91.2 kg)        Patient has been counseled extensively about nutrition and exercise as well as the importance of adherence with medications and regular follow-up. The patient was given clear instructions to go to ER or return to medical center if symptoms don't improve, worsen or new problems develop. The patient verbalized understanding.   Follow-up: Return if symptoms worsen or fail to improve.   Claiborne Rigg, FNP-BC [facility] [place], [place] [phone]   04/25/2018, 4:51 PM

## 2018-04-26 ENCOUNTER — Telehealth: Payer: Self-pay | Admitting: Nurse Practitioner

## 2018-04-26 ENCOUNTER — Other Ambulatory Visit: Payer: Self-pay | Admitting: Nurse Practitioner

## 2018-04-26 MED ORDER — DOXYCYCLINE HYCLATE 100 MG PO CAPS: 100.0000 mg | ORAL_CAPSULE | Freq: Two times a day (BID) | ORAL | 0 refills | Status: DC

## 2018-04-26 NOTE — Telephone Encounter (Signed)
Pt called in regards to he tele-vist yesterday he said he asked his DR to prescribe an antibiotic pill for his ear infection but he was only given drops he says these dont help with the pain he had taken these previously when he went to urgent care would like the antibiotics sent to  [id number] - [place], [place] - [address] Please follow up

## 2018-04-26 NOTE — Telephone Encounter (Signed)
New Message   Pt is calling to request pill instead of the liquid solution for his right ear. Please f/u

## 2018-04-26 NOTE — Telephone Encounter (Signed)
Please inform patient, Provider sent a ear drop ofloxacin (OCUFLOX) 0.3 % ophthalmic solution to [facility].

## 2018-04-26 NOTE — Telephone Encounter (Signed)
Will route to PCP 

## 2018-06-23 ENCOUNTER — Ambulatory Visit: Payer: Self-pay | Admitting: Family Medicine

## 2018-07-26 ENCOUNTER — Other Ambulatory Visit: Payer: Self-pay

## 2018-07-26 ENCOUNTER — Ambulatory Visit (HOSPITAL_COMMUNITY)
Admission: EM | Admit: 2018-07-26 | Discharge: 2018-07-26 | Disposition: A | Discharge status: Home / Self Care | Payer: Self-pay

## 2018-07-26 ENCOUNTER — Encounter (HOSPITAL_COMMUNITY): Payer: Self-pay | Admitting: Urgent Care

## 2018-07-26 DIAGNOSIS — S61210A Laceration without foreign body of right index finger without damage to nail, initial encounter: Secondary | ICD-10-CM

## 2018-07-26 DIAGNOSIS — M79644 Pain in right finger(s): Secondary | ICD-10-CM

## 2018-07-26 NOTE — ED Provider Notes (Signed)
  MRN: [id number] DOB: September 30, 1970  Subjective:   Gabriel Ball is a 48 y.o. male presenting for suffering an acute laceration while at work today.  Patient was working with a piece of ceramic and accidentally cut his right hand over dorsal aspect of his right knuckle.  He reports that he spoke with his supervisor and came straight to our clinic.  His last Tdap was in 2017.  [place] has a medication list, allergies, pmh and psh that were reviewed, updated as appropriate and not included due to being a worker's injury case.  ROS  Objective:   Vitals: BP 126/72 (BP Location: Right Arm)   Pulse 94   Temp 98.7 F (37.1 C) (Oral)   Resp 18   Wt 193 lb (87.5 kg)   SpO2 99%   BMI 32.12 kg/m   Physical Exam Constitutional:      Appearance: Normal appearance. He is well-developed and normal weight.  HENT:     Head: Normocephalic and atraumatic.     Right Ear: External ear normal.     Left Ear: External ear normal.     Nose: Nose normal.     Mouth/Throat:     Pharynx: Oropharynx is clear.  Eyes:     Extraocular Movements: Extraocular movements intact.     Pupils: Pupils are equal, round, and reactive to light.  Cardiovascular:     Rate and Rhythm: Normal rate.  Pulmonary:     Effort: Pulmonary effort is normal.  Musculoskeletal:     Right hand: He exhibits tenderness (Over wound) and laceration. He exhibits normal range of motion (Full range of motion both active and passive), no bony tenderness, normal capillary refill, no deformity and no swelling. Normal sensation noted. Normal strength noted.       Hands:  Neurological:     Mental Status: He is alert and oriented to person, place, and time.  Psychiatric:        Mood and Affect: Mood normal.        Behavior: Behavior normal.     PROCEDURE NOTE: laceration repair Verbal consent obtained from patient.  Local anesthesia with 1cc Lidocaine 2% without epinephrine.  Wound explored for tendon, ligament damage. Wound scrubbed with  soap and water and rinsed. Wound closed with #1 4-0 Prolene (horizontal mattress) sutures.  Wound cleansed and dressed.   Assessment and Plan :   1. Laceration of right index finger without foreign body without damage to nail, initial encounter   2. Finger pain, right    Laceration repaired successfully. Wound care reviewed.  Use Tylenol and ibuprofen for pain and inflammation.  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  Return-to-clinic precautions discussed, patient verbalized understanding. Otherwise, follow up in 7 to 10 days for suture removal.     Jaynee Eagles, PA-C 07/26/18 1030

## 2018-07-26 NOTE — ED Triage Notes (Addendum)
Cut right index finger with ceramic piece about 30 minutes ago.    Unknown last tetanus  Simple laceration to back of right index finger, bleeding controlled and able to move index finger

## 2018-07-26 NOTE — Discharge Instructions (Addendum)
Tome 500mg de Tylenol con ibuprofen 400-600mg cada 6 horas con comida para dolor y inflammacion.  °

## 2018-08-04 ENCOUNTER — Ambulatory Visit (HOSPITAL_COMMUNITY)
Admission: EM | Admit: 2018-08-04 | Discharge: 2018-08-04 | Disposition: A | Discharge status: Home / Self Care | Payer: Self-pay

## 2018-08-04 ENCOUNTER — Other Ambulatory Visit: Payer: Self-pay

## 2018-08-04 ENCOUNTER — Encounter (HOSPITAL_COMMUNITY): Payer: Self-pay

## 2018-08-04 DIAGNOSIS — Z4802 Encounter for removal of sutures: Secondary | ICD-10-CM

## 2018-08-04 NOTE — ED Triage Notes (Signed)
one suture removed from right index finger, wound healing well.

## 2018-10-15 ENCOUNTER — Encounter (HOSPITAL_COMMUNITY): Payer: Self-pay

## 2018-10-15 ENCOUNTER — Other Ambulatory Visit: Payer: Self-pay

## 2018-10-15 ENCOUNTER — Ambulatory Visit (HOSPITAL_COMMUNITY)
Admission: EM | Admit: 2018-10-15 | Discharge: 2018-10-15 | Disposition: A | Discharge status: Home / Self Care | Payer: Self-pay | Attending: Urgent Care | Admitting: Urgent Care

## 2018-10-15 DIAGNOSIS — Z760 Encounter for issue of repeat prescription: Secondary | ICD-10-CM

## 2018-10-15 DIAGNOSIS — B009 Herpesviral infection, unspecified: Secondary | ICD-10-CM

## 2018-10-15 MED ORDER — ACYCLOVIR 400 MG PO TABS: 400.0000 mg | ORAL_TABLET | Freq: Three times a day (TID) | ORAL | 1 refills | Status: DC

## 2018-10-15 NOTE — ED Provider Notes (Signed)
  MRN: [id number] DOB: Mar 05, 1970  Subjective:   Gabriel Ball is a 48 y.o. male presenting for medication refill of his herpes simplex.  States that he has 1-2 episodes every 6 months to 1 year.  He is currently out of his medication.  He would also like to ask about his vitamin D supplement.  States that the pharmacy only gives him 4 pills at a time and has previously discussed this with his provider who provides enough for 3 months but has never gotten that amount from the pharmacy.  He plans on setting up a follow-up appointment for recheck on labs this upcoming week.  No current facility-administered medications for this encounter.   Current Outpatient Medications:  Marland Kitchen  Vitamin D, Ergocalciferol, (DRISDOL) 1.25 MG (50000 UT) CAPS capsule, Take 1 capsule (50,000 Units total) by mouth every 7 (seven) days., Disp: 16 capsule, Rfl: 0   Allergies  Allergen Reactions  . Cyclobenzaprine     Facial Numbness/dizziness/hypersomnolence  . Methocarbamol     Abdominal pain  . Amoxicillin Itching    Pruritic rash  . Penicillins Palpitations    Has patient had a PCN reaction causing immediate rash, facial/tongue/throat swelling, SOB or lightheadedness with hypotension: No Has patient had a PCN reaction causing severe rash involving mucus membranes or skin necrosis: No Has patient had a PCN reaction that required hospitalization: No Has patient had a PCN reaction occurring within the last 10 years: No If all of the above answers are "NO", then may proceed with Cephalosporin use.    Past Medical History:  Diagnosis Date  . Asthma   . Herpes   . Shingles      History reviewed. No pertinent surgical history.  ROS  Objective:   Vitals: BP 115/75 (BP Location: Left Arm)   Pulse 73   Temp 98.9 F (37.2 C)   Resp 16   SpO2 100%   Physical Exam Constitutional:      Appearance: Normal appearance. He is well-developed and normal weight.  HENT:     Head: Normocephalic and atraumatic.   Right Ear: External ear normal.     Left Ear: External ear normal.     Nose: Nose normal.     Mouth/Throat:     Pharynx: Oropharynx is clear.  Eyes:     Extraocular Movements: Extraocular movements intact.     Pupils: Pupils are equal, round, and reactive to light.  Cardiovascular:     Rate and Rhythm: Normal rate.  Pulmonary:     Effort: Pulmonary effort is normal.  Neurological:     Mental Status: He is alert and oriented to person, place, and time.  Psychiatric:        Mood and Affect: Mood normal.        Behavior: Behavior normal.     Assessment and Plan :   1. Medication refill   2. Herpes simplex     Medication refill provided to patient, counseled on appropriate dosing.  Recommended patient not use this for suppression therapy as he does not have outbreaks very frequently.  Encourage patient to set up his follow-up with his PCP for recheck on his labs.   Jaynee Eagles, PA-C 10/15/18 1130

## 2018-10-15 NOTE — ED Triage Notes (Signed)
Pt need a refill on his medication, ACYCLOVIR 400mg . Pt would like the prescription to be sent to [facility] instead of [facility] with refills.

## 2018-10-15 NOTE — Discharge Instructions (Addendum)
Cuando te sale sarpullido toma 1 pastilla 3 veces al dia solo por 5 dias. Toma el medicamente lo mas pronto posible cuando te sale el sarpullido.

## 2018-10-20 ENCOUNTER — Encounter (HOSPITAL_COMMUNITY): Payer: Self-pay

## 2018-10-20 ENCOUNTER — Ambulatory Visit (HOSPITAL_COMMUNITY)
Admission: EM | Admit: 2018-10-20 | Discharge: 2018-10-20 | Disposition: A | Discharge status: Home / Self Care | Payer: Self-pay | Attending: Family Medicine | Admitting: Family Medicine

## 2018-10-20 ENCOUNTER — Other Ambulatory Visit: Payer: Self-pay

## 2018-10-20 DIAGNOSIS — S161XXA Strain of muscle, fascia and tendon at neck level, initial encounter: Secondary | ICD-10-CM

## 2018-10-20 DIAGNOSIS — M79601 Pain in right arm: Secondary | ICD-10-CM

## 2018-10-20 MED ORDER — NAPROXEN 375 MG PO TABS: 375.0000 mg | ORAL_TABLET | Freq: Two times a day (BID) | ORAL | 0 refills | Status: DC

## 2018-10-20 NOTE — Discharge Instructions (Signed)
Empiece a tomar [employer] al da para Best boy. tmelo con alimentos y no tome ibuprofeno adicional. aplicacin de hielo en el brazo derecho. actividad ligera y regular segn la tolerancia. Espero que su dolor mejore durante las prximas 2 semanas. cualquier empeoramiento significativo de los sntomas, dirjase a la sala de Multimedia programmer. si sus sntomas persisten, consulte con su proveedor de atencin primaria.  please start naproxen twice a day to help with pain. take with food and don't take additional ibuprofen. ice application to right arm. light and regular activity as tolerated.  i do expect that your pain will improve over the next 2 weeks.  any significant worsening of symptoms please go to the ER.  if your symptoms persist please follow up with your primary care provider.

## 2018-10-20 NOTE — ED Triage Notes (Signed)
Pt report having right arm pain, pain right side of the head, neck pain tongue feel a little bit heavy. Pt has concern for stroke, as he had right side  facial paralysis on 2000. Pt also report he was carrying a heavy ladder yesterday at work.

## 2018-10-20 NOTE — ED Provider Notes (Signed)
[place]    CSN: [id number] Arrival date & time: 10/20/18  1859      History   Chief Complaint Chief Complaint  Patient presents with  . Arm Pain  . Neck Pain  . Facial Laceration    [name] is a 48 y.o. male.   Gabriel Ball presents with complaints of pain to right inner arm near antecubital space which started this morning. Worse with flexion, burning pain. He had lifted a large heavy ladder with weight predominately on his right arm, yesterday. No specific pain immediately following this. No numbness tingling or weakness to the arm or hand. Took ibuprofen earlier today which did help some. Denies any previous injury to the arm. Also complaints of right sided neck pain. No headache, no dizziness, no vision changes. Rotating to the left does worsen the pain. 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 Denies any previous similar.   Per chart review patient has a history of anxiety, right sided ear pain, headaches and has had CVA rule outs in the past. No confusion, facial weakness, slurred speech or other red flag concerns or findings here tonight.   Spanish video interpreter used to collect history and physical exam.    ROS per HPI, negative if not otherwise mentioned.      Past Medical History:  Diagnosis Date  . Asthma   . Herpes   . Shingles     Patient Active Problem List   Diagnosis Date Noted  . Asthma due to environmental allergies 12/20/2016  . HSV (herpes simplex virus) infection 02/24/2015  . Anxiety 04/02/2014    History reviewed. No pertinent surgical history.     Home Medications    Prior to Admission medications   Medication Sig Start Date End Date Taking? Authorizing Provider  ibuprofen (ADVIL) 200 MG tablet Take 200 mg by mouth every 6 (six) hours as needed.   Yes [provider]  acyclovir (ZOVIRAX) 400 MG tablet Take 1 tablet (400 mg  total) by mouth 3 (three) times daily. 10/15/18   Jaynee Eagles, PA-C  naproxen (NAPROSYN) 375 MG tablet Take 1 tablet (375 mg total) by mouth 2 (two) times daily. 10/20/18   Zigmund Gottron, NP  Vitamin D, Ergocalciferol, (DRISDOL) 1.25 MG (50000 UT) CAPS capsule Take 1 capsule (50,000 Units total) by mouth every 7 (seven) days. 02/10/18   Argentina Donovan, PA-C  cetirizine (ZYRTEC ALLERGY) 10 MG tablet Take 1 tablet (10 mg total) by mouth daily. 04/25/18 07/26/18  Gildardo Pounds, NP  fluticasone (FLONASE) 50 MCG/ACT nasal spray Place 2 sprays into both nostrils daily. 04/25/18 07/26/18  Gildardo Pounds, NP  SUMAtriptan (IMITREX) 50 MG tablet Take one for headache.  May repeat in 2 hours.  No more than 2 a day Patient not taking: Reported on 02/09/2018 05/27/17 07/26/18  Raylene Everts, MD    Family History Family History  Problem Relation Age of Onset  . Cancer Father     Social History Social History   Tobacco Use  . Smoking status: Former Research scientist (life sciences)  . Smokeless tobacco: Never Used  Substance Use Topics  . Alcohol use: No  . Drug use: No     Allergies   Cyclobenzaprine, Methocarbamol, Amoxicillin, and Penicillins   Review of Systems Review of Systems   Physical Exam Triage Vital Signs ED Triage Vitals  Enc Vitals Group  BP 10/20/18 1935 110/77     Pulse Rate 10/20/18 1935 75     Resp 10/20/18 1935 16     Temp 10/20/18 1935 98.1 F (36.7 C)     Temp Source 10/20/18 1935 Oral     SpO2 10/20/18 1935 97 %     Weight --      Height --      Head Circumference --      Peak Flow --      Pain Score 10/20/18 1933 6     Pain Loc --      Pain Edu? --      Excl. in GC? --    No data found.  Updated Vital Signs BP 110/77 (BP Location: Left Arm)   Pulse 75   Temp 98.1 F (36.7 C) (Oral)   Resp 16   SpO2 97%   Visual Acuity Right Eye Distance:   Left Eye Distance:   Bilateral Distance:    Right Eye Near:   Left Eye Near:    Bilateral Near:     Physical Exam  Constitutional:      Appearance: He is well-developed.  HENT:     Head: Normocephalic and atraumatic.     Nose: Nose normal.     Mouth/Throat:     Mouth: Mucous membranes are moist.  Eyes:     Extraocular Movements: Extraocular movements intact.     Conjunctiva/sclera: Conjunctivae normal.     Pupils: Pupils are equal, round, and reactive to light.  Neck:     Musculoskeletal: Normal range of motion. Normal range of motion. Pain with movement and muscular tenderness present. No neck rigidity, torticollis or spinous process tenderness.     Vascular: No carotid bruit.   Cardiovascular:     Rate and Rhythm: Normal rate and regular rhythm.  Pulmonary:     Effort: Pulmonary effort is normal.     Breath sounds: Normal breath sounds.  Musculoskeletal:       Arms:     Comments: Right distal bicep tendon with tenderness on palpation, mild pain with flexion of elbow; no redness swelling or warmth; full ROM; strength equal bilaterally; gross sensation intact to bilateral upper extremities; right hand is warm, strong radial pulse; cap refill < 2 seconds    Lymphadenopathy:     Cervical: No cervical adenopathy.  Skin:    General: Skin is warm and dry.  Neurological:     Mental Status: He is alert and oriented to person, place, and time.      UC Treatments / Results  Labs (all labs ordered are listed, but only abnormal results are displayed) Labs Reviewed - No data to display  EKG   Radiology No results found.  Procedures Procedures (including critical care time)  Medications Ordered in UC Medications - No data to display  Initial Impression / Assessment and Plan / UC Course  I have reviewed the triage vital signs and the nursing notes.  Pertinent labs & imaging results that were available during my care of the patient were reviewed by me and considered in my medical decision making (see chart for details).     Right arm and neck strain from physical activities yesterday.  Pain management discussed. Patient unable to tolerate muscle relaxer's. Opted to stick with nsaids, ice. Return precautions provided. Follow up with PCP as needed. Patient verbalized understanding and agreeable to plan.   Final Clinical Impressions(s) / UC Diagnoses   Final diagnoses:  Right arm pain  Strain of neck muscle, initial encounter     Discharge Instructions     Empiece a tomar naproxeno dos veces al da para Engineer, materials. tmelo con alimentos y no tome ibuprofeno adicional. aplicacin de hielo en el brazo derecho. actividad ligera y regular segn la tolerancia. Espero que su dolor mejore durante las prximas 2 semanas. cualquier empeoramiento significativo de los sntomas, dirjase a la sala de Sports administrator. si sus sntomas persisten, consulte con su proveedor de atencin primaria.  please start naproxen twice a day to help with pain. take with food and don't take additional ibuprofen. ice application to right arm. light and regular activity as tolerated.  i do expect that your pain will improve over the next 2 weeks.  any significant worsening of symptoms please go to the ER.  if your symptoms persist please follow up with your primary care provider.     ED Prescriptions    Medication Sig Dispense Auth. Provider   naproxen (NAPROSYN) 375 MG tablet Take 1 tablet (375 mg total) by mouth 2 (two) times daily. 20 tablet Georgetta Haber, NP     PDMP not reviewed this encounter.   Georgetta Haber, NP 10/20/18 2028

## 2018-11-13 ENCOUNTER — Ambulatory Visit: Payer: Self-pay | Attending: Nurse Practitioner | Admitting: Nurse Practitioner

## 2018-11-13 ENCOUNTER — Other Ambulatory Visit: Payer: Self-pay

## 2018-11-13 ENCOUNTER — Encounter: Payer: Self-pay | Admitting: Nurse Practitioner

## 2018-11-13 VITALS — BP 127/64 | HR 77 | Temp 99.0°F | Ht 66.0 in | Wt 193.0 lb

## 2018-11-13 DIAGNOSIS — B009 Herpesviral infection, unspecified: Secondary | ICD-10-CM

## 2018-11-13 DIAGNOSIS — J3089 Other allergic rhinitis: Secondary | ICD-10-CM

## 2018-11-13 DIAGNOSIS — Z0000 Encounter for general adult medical examination without abnormal findings: Secondary | ICD-10-CM

## 2018-11-13 MED ORDER — CETIRIZINE HCL 10 MG PO TABS: 10.0000 mg | ORAL_TABLET | Freq: Every day | ORAL | 3 refills | Status: DC

## 2018-11-13 MED ORDER — ACYCLOVIR 400 MG PO TABS: 400.0000 mg | ORAL_TABLET | Freq: Three times a day (TID) | ORAL | 3 refills | Status: AC

## 2018-11-13 NOTE — Progress Notes (Signed)
Assessment & Plan:  Gabriel Ball was seen today for annual exam.  Diagnoses and all orders for this visit:  Annual physical exam -     VITAMIN D 25 Hydroxy (Vit-D Deficiency, Fractures) -     CMP14+EGFR -     Lipid panel  Environmental and seasonal allergies -     cetirizine (ZYRTEC ALLERGY) 10 MG tablet; Take 1 tablet (10 mg total) by mouth daily.  HSV (herpes simplex virus) infection -     acyclovir (ZOVIRAX) 400 MG tablet; Take 1 tablet (400 mg total) by mouth 3 (three) times daily for 10 days.    Patient has been counseled on age-appropriate routine health concerns for screening and prevention. These are reviewed and up-to-date. Referrals have been placed accordingly. Immunizations are up-to-date or declined.    Subjective:   Chief Complaint  Patient presents with  . Annual Exam   [name] 48 y.o. male presents to office today for annual physical.   Review of Systems  Constitutional: Negative for fever, malaise/fatigue and weight loss.  HENT: Negative.  Negative for nosebleeds.   Eyes: Negative.  Negative for blurred vision, double vision and photophobia.  Respiratory: Positive for cough. Negative for shortness of breath.   Cardiovascular: Negative.  Negative for chest pain, palpitations and leg swelling.  Gastrointestinal: Negative.  Negative for heartburn, nausea and vomiting.  Genitourinary: Negative.   Musculoskeletal: Negative.  Negative for myalgias.  Skin: Negative.   Neurological: Negative.  Negative for dizziness, focal weakness, seizures and headaches.  Endo/Heme/Allergies: Positive for environmental allergies.  Psychiatric/Behavioral: Negative for suicidal ideas. The patient is nervous/anxious.     Past Medical History:  Diagnosis Date  . Asthma   . Herpes   . Shingles     History reviewed. No pertinent surgical history.  Family History  Problem Relation Age of Onset  . Cancer Father     Social History Reviewed with no changes to be made  today.   Outpatient Medications Prior to Visit  Medication Sig Dispense Refill  . ibuprofen (ADVIL) 200 MG tablet Take 200 mg by mouth every 6 (six) hours as needed.    . Vitamin D, Ergocalciferol, (DRISDOL) 1.25 MG (50000 UT) CAPS capsule Take 1 capsule (50,000 Units total) by mouth every 7 (seven) days. 16 capsule 0  . acyclovir (ZOVIRAX) 400 MG tablet Take 1 tablet (400 mg total) by mouth 3 (three) times daily. 30 tablet 1  . naproxen (NAPROSYN) 375 MG tablet Take 1 tablet (375 mg total) by mouth 2 (two) times daily. (Patient not taking: Reported on 11/13/2018) 20 tablet 0   No facility-administered medications prior to visit.     Allergies  Allergen Reactions  . Cyclobenzaprine     Facial Numbness/dizziness/hypersomnolence  . Methocarbamol     Abdominal pain  . Amoxicillin Itching    Pruritic rash  . Penicillins Palpitations    Has patient had a PCN reaction causing immediate rash, facial/tongue/throat swelling, SOB or lightheadedness with hypotension: No Has patient had a PCN reaction causing severe rash involving mucus membranes or skin necrosis: No Has patient had a PCN reaction that required hospitalization: No Has patient had a PCN reaction occurring within the last 10 years: No If all of the above answers are "NO", then may proceed with Cephalosporin use.       Objective:    BP 127/64 (BP Location: Left Arm, Patient Position: Sitting, Cuff Size: Normal)   Pulse 77   Temp 99 F (37.2 C) (Oral)  Ht _0  (1.676 m)   Wt 193 lb (87.5 kg)   SpO2 99%   BMI 31.15 kg/m  Wt Readings from Last 3 Encounters:  11/13/18 193 lb (87.5 kg)  07/26/18 193 lb (87.5 kg)  02/17/18 198 lb (89.8 kg)    Physical Exam Constitutional:      Appearance: He is well-developed.  HENT:     Head: Normocephalic and atraumatic.     Right Ear: Hearing, tympanic membrane, ear canal and external ear normal.     Left Ear: Hearing, tympanic membrane, ear canal and external ear normal.      Nose: Nose normal. No mucosal edema or rhinorrhea.     Mouth/Throat:     Pharynx: Uvula midline.     Tonsils: No tonsillar exudate. 1+ on the right. 1+ on the left.  Eyes:     General: Lids are normal. No scleral icterus.    Conjunctiva/sclera: Conjunctivae normal.     Pupils: Pupils are equal, round, and reactive to light.  Neck:     Musculoskeletal: Normal range of motion and neck supple.     Thyroid: No thyromegaly.     Trachea: No tracheal deviation.  Cardiovascular:     Rate and Rhythm: Normal rate and regular rhythm.     Pulses:          Dorsalis pedis pulses are 1+ on the right side and 1+ on the left side.       Posterior tibial pulses are 1+ on the right side and 1+ on the left side.     Heart sounds: Normal heart sounds. No murmur. No friction rub. No gallop.   Pulmonary:     Effort: Pulmonary effort is normal. No respiratory distress.     Breath sounds: Normal breath sounds. No wheezing or rales.  Chest:     Chest wall: No mass or tenderness.     Breasts:        Right: No inverted nipple, mass, nipple discharge, skin change or tenderness.        Left: No inverted nipple, mass, nipple discharge, skin change or tenderness.  Abdominal:     General: Bowel sounds are normal. There is no distension.     Palpations: Abdomen is soft. There is no mass.     Tenderness: There is no abdominal tenderness. There is no guarding or rebound.     Hernia: There is no hernia in the left inguinal area.  Genitourinary:    Penis: Normal.      Scrotum/Testes: Normal.        Right: Mass, tenderness or swelling not present. Right testis is descended. Cremasteric reflex is present.         Left: Mass, tenderness or swelling not present. Left testis is descended. Cremasteric reflex is present.   Musculoskeletal: Normal range of motion.        General: No tenderness or deformity.  Feet:     Right foot:     Skin integrity: Skin integrity normal.     Left foot:     Skin integrity: Skin  integrity normal.  Lymphadenopathy:     Cervical: No cervical adenopathy.     Lower Body: No right inguinal adenopathy. No left inguinal adenopathy.  Skin:    General: Skin is warm and dry.     Capillary Refill: Capillary refill takes less than 2 seconds.     Findings: No erythema.  Neurological:     Mental Status: He is alert and oriented  to person, place, and time.     Cranial Nerves: Cranial nerves are intact. No cranial nerve deficit.     Sensory: Sensation is intact.     Motor: Motor function is intact. No abnormal muscle tone.     Coordination: Coordination is intact. Coordination normal.     Deep Tendon Reflexes: Reflexes normal.     Reflex Scores:      Patellar reflexes are 1+ on the right side and 1+ on the left side. Psychiatric:        Behavior: Behavior normal.        Thought Content: Thought content normal.        Judgment: Judgment normal.        Patient has been counseled extensively about nutrition and exercise as well as the importance of adherence with medications and regular follow-up. The patient was given clear instructions to go to ER or return to medical center if symptoms don't improve, worsen or new problems develop. The patient verbalized understanding.   Follow-up: Return if symptoms worsen or fail to improve.   Gildardo Pounds, FNP-BC [facility], [facility]   11/13/2018, 12:42 PM

## 2018-11-14 LAB — CMP14+EGFR
ALT: 26 IU/L (ref 0–44)
AST: 22 IU/L (ref 0–40)
Albumin/Globulin Ratio: 1.9 (ref 1.2–2.2)
Albumin: 4.5 g/dL (ref 4.0–5.0)
Alkaline Phosphatase: 94 IU/L (ref 39–117)
BUN/Creatinine Ratio: 10 (ref 9–20)
BUN: 7 mg/dL (ref 6–24)
Bilirubin Total: 0.3 mg/dL (ref 0.0–1.2)
CO2: 23 mmol/L (ref 20–29)
Calcium: 9.2 mg/dL (ref 8.7–10.2)
Chloride: 107 mmol/L — ABNORMAL HIGH (ref 96–106)
Creatinine, Ser: 0.73 mg/dL — ABNORMAL LOW (ref 0.76–1.27)
GFR calc Af Amer: 128 mL/min/{1.73_m2} (ref >59)
GFR calc non Af Amer: 110 mL/min/{1.73_m2} (ref >59)
Globulin, Total: 2.4 g/dL (ref 1.5–4.5)
Glucose: 92 mg/dL (ref 65–99)
Potassium: 4.1 mmol/L (ref 3.5–5.2)
Sodium: 144 mmol/L (ref 134–144)
Total Protein: 6.9 g/dL (ref 6.0–8.5)

## 2018-11-14 LAB — VITAMIN D 25 HYDROXY (VIT D DEFICIENCY, FRACTURES): Vit D, 25-Hydroxy: 13.1 ng/mL — ABNORMAL LOW (ref 30.0–100.0)

## 2018-11-14 LAB — LIPID PANEL
Chol/HDL Ratio: 7.5 ratio — ABNORMAL HIGH (ref 0.0–5.0)
Cholesterol, Total: 173 mg/dL (ref 100–199)
HDL: 23 mg/dL — ABNORMAL LOW (ref >39)
LDL Chol Calc (NIH): 95 mg/dL (ref 0–99)
Triglycerides: 329 mg/dL — ABNORMAL HIGH (ref 0–149)
VLDL Cholesterol Cal: 55 mg/dL — ABNORMAL HIGH (ref 5–40)

## 2018-11-20 ENCOUNTER — Other Ambulatory Visit: Payer: Self-pay | Admitting: Nurse Practitioner

## 2018-11-20 MED ORDER — VITAMIN D (ERGOCALCIFEROL) 1.25 MG (50000 UNIT) PO CAPS: 50000.0000 [IU] | ORAL_CAPSULE | ORAL | 0 refills | Status: AC

## 2019-04-25 ENCOUNTER — Other Ambulatory Visit: Payer: Self-pay

## 2019-04-25 ENCOUNTER — Ambulatory Visit: Payer: Self-pay | Attending: Nurse Practitioner | Admitting: Physician Assistant

## 2019-04-25 VITALS — BP 114/71 | HR 72 | Temp 98.2°F | Resp 16 | Wt 190.2 lb

## 2019-04-25 DIAGNOSIS — B009 Herpesviral infection, unspecified: Secondary | ICD-10-CM

## 2019-04-25 DIAGNOSIS — J3089 Other allergic rhinitis: Secondary | ICD-10-CM

## 2019-04-25 MED ORDER — OLOPATADINE HCL 0.1 % OP SOLN
1.0000 [drp] | OPHTHALMIC | 3 refills | Status: DC
Start: 2019-04-25 — End: 2019-04-25

## 2019-04-25 MED ORDER — ACYCLOVIR 200 MG PO CAPS: 200.0000 mg | ORAL_CAPSULE | Freq: Three times a day (TID) | ORAL | 1 refills | Status: DC

## 2019-04-25 MED ORDER — OLOPATADINE HCL 0.1 % OP SOLN: 1.0000 [drp] | Freq: Every day | OPHTHALMIC | 3 refills | Status: AC

## 2019-04-25 NOTE — Progress Notes (Signed)
Patient ID: Gabriel Ball, male   DOB: 1970/11/11, 49 y.o.   MRN: [id number]   [name], is a 49 y.o. male  OIZ:[id number]  PJA:[id number]  DOB - [birth year]  Subjective:  Chief Complaint and HPI: Gabriel Ball is a 49 y.o. male here today for red itchy eye for about 4 days.  Not painful.  No vision changes.  Some sneezing.  Seems to have gotten worse with spring flowers blooming.  No concern for FB.    Also needs RF for acyclovir.  Wants prescriptions printed so he can take them to the pharmacy of his choice.  Shea Stakes with [employer] interpreters   ROS:   Constitutional:  No f/c, No night sweats, No unexplained weight loss. EENT:  No vision changes, No blurry vision, No hearing changes. No mouth, throat, or ear problems.  Respiratory: No cough, No SOB Cardiac: No CP, no palpitations GI:  No abd pain, No N/V/D. GU: No Urinary s/sx Musculoskeletal: No joint pain Neuro: No headache, no dizziness, no motor weakness.  Skin: No rash Endocrine:  No polydipsia. No polyuria.  Psych: Denies SI/HI  No problems updated.  ALLERGIES: Allergies  Allergen Reactions  . Cyclobenzaprine     Facial Numbness/dizziness/hypersomnolence  . Methocarbamol     Abdominal pain  . Amoxicillin Itching    Pruritic rash  . Penicillins Palpitations    Has patient had a PCN reaction causing immediate rash, facial/tongue/throat swelling, SOB or lightheadedness with hypotension: No Has patient had a PCN reaction causing severe rash involving mucus membranes or skin necrosis: No Has patient had a PCN reaction that required hospitalization: No Has patient had a PCN reaction occurring within the last 10 years: No If all of the above answers are "NO", then may proceed with Cephalosporin use.    PAST MEDICAL HISTORY: Past Medical History:  Diagnosis Date  . Asthma   . Herpes   . Shingles     MEDICATIONS AT HOME: Prior to Admission medications   Medication Sig Start Date End Date Taking?  Authorizing Provider  acyclovir (ZOVIRAX) 200 MG capsule Take 1 capsule (200 mg total) by mouth 3 (three) times daily. X 5 days prn 04/25/19   Argentina Donovan, PA-C  cetirizine (ZYRTEC ALLERGY) 10 MG tablet Take 1 tablet (10 mg total) by mouth daily. 11/13/18 02/11/19  Gildardo Pounds, NP  ibuprofen (ADVIL) 200 MG tablet Take 200 mg by mouth every 6 (six) hours as needed.    [provider]  olopatadine (PATANOL) 0.1 % ophthalmic solution Place 1 drop into both eyes daily for 1 dose. For allergies 04/25/19 04/26/19  Argentina Donovan, PA-C  fluticasone (FLONASE) 50 MCG/ACT nasal spray Place 2 sprays into both nostrils daily. 04/25/18 07/26/18  Gildardo Pounds, NP  SUMAtriptan (IMITREX) 50 MG tablet Take one for headache.  May repeat in 2 hours.  No more than 2 a day Patient not taking: Reported on 02/09/2018 05/27/17 07/26/18  Raylene Everts, MD     Objective:  EXAM:   Vitals:   04/25/19 1439  BP: 114/71  Pulse: 72  Resp: 16  Temp: 98.2 F (36.8 C)  SpO2: 96%  Weight: 190 lb 3.2 oz (86.3 kg)    General appearance : A&OX3. NAD. Non-toxic-appearing HEENT: Atraumatic and Normocephalic.  PERRLA. EOM intact.  R conjunctivae with mild erythema and slight chemosis.  Fundi benign.   No preauricular nodes Chest/Lungs:  Breathing-non-labored, Good air entry bilaterally, breath sounds normal without rales, rhonchi, or wheezing  CVS: S1  S2 regular, no murmurs, gallops, rubs  Extremities: Bilateral Lower Ext shows no edema, both legs are warm to touch with = pulse throughout Neurology:  CN II-XII grossly intact, Non focal.   Psych:  TP linear. J/I WNL. Normal speech. Appropriate eye contact and affect.  Skin:  No Rash  Data Review Lab Results  Component Value Date   HGBA1C 5.1 01/25/2018   HGBA1C 5.1 12/31/2015   HGBA1C 5.10 10/31/2014     Assessment & Plan   1. Environmental and seasonal allergies - olopatadine (PATANOL) 0.1 % ophthalmic solution; Place 1 drop into both eyes  daily for 1 dose. For allergies  Dispense: 5 mL; Refill: 3 -continue zyrtec  2. HSV (herpes simplex virus) infection - acyclovir (ZOVIRAX) 200 MG capsule; Take 1 capsule (200 mg total) by mouth 3 (three) times daily. X 5 days prn  Dispense: 60 capsule; Refill: 1   Patient have been counseled extensively about nutrition and exercise  Return if symptoms worsen or fail to improve.  The patient was given clear instructions to go to ER or return to medical center if symptoms don't improve, worsen or new problems develop. The patient verbalized understanding. The patient was told to call to get lab results if they haven't heard anything in the next week.     Georgian Co, PA-C [facility] [place], [place] [phone]   04/25/2019, 3:21 PM

## 2019-05-17 ENCOUNTER — Ambulatory Visit (HOSPITAL_COMMUNITY)
Admission: EM | Admit: 2019-05-17 | Discharge: 2019-05-17 | Disposition: A | Discharge status: Home / Self Care | Payer: Self-pay | Attending: Family Medicine | Admitting: Family Medicine

## 2019-05-17 ENCOUNTER — Encounter (HOSPITAL_COMMUNITY): Payer: Self-pay | Admitting: Family Medicine

## 2019-05-17 ENCOUNTER — Other Ambulatory Visit: Payer: Self-pay

## 2019-05-17 DIAGNOSIS — K640 First degree hemorrhoids: Secondary | ICD-10-CM

## 2019-05-17 MED ORDER — HYDROCORTISONE (PERIANAL) 2.5 % EX CREA: 1.0000 "application " | TOPICAL_CREAM | Freq: Two times a day (BID) | CUTANEOUS | 0 refills | Status: DC

## 2019-05-17 NOTE — ED Triage Notes (Signed)
Pt presents to UC with discomfort in his rectal area.

## 2019-05-17 NOTE — ED Provider Notes (Signed)
[facility]    CSN: [id number] Arrival date & time: 05/17/19  1912      History   Chief Complaint No chief complaint on file.   [name] is a 49 y.o. male.   This is an established [facility] urgent care patient who complains of perianal pain for couple days.  He works as a Nature conservation officer.  He does not recall any recent accident.  The pain is worse when he sits.  He notes a small swelling in the anal verge.     Past Medical History:  Diagnosis Date  . Asthma   . Herpes   . Shingles     Patient Active Problem List   Diagnosis Date Noted  . Asthma due to environmental allergies 12/20/2016  . HSV (herpes simplex virus) infection 02/24/2015  . Anxiety 04/02/2014    History reviewed. No pertinent surgical history.     Home Medications    Prior to Admission medications   Medication Sig Start Date End Date Taking? Authorizing Provider  acyclovir (ZOVIRAX) 200 MG capsule Take 1 capsule (200 mg total) by mouth 3 (three) times daily. X 5 days prn 04/25/19   Argentina Donovan, PA-C  cetirizine (ZYRTEC ALLERGY) 10 MG tablet Take 1 tablet (10 mg total) by mouth daily. 11/13/18 02/11/19  Gildardo Pounds, NP  hydrocortisone (ANUSOL-HC) 2.5 % rectal cream Place 1 application rectally 2 (two) times daily. 05/17/19   Robyn Haber, MD  ibuprofen (ADVIL) 200 MG tablet Take 200 mg by mouth every 6 (six) hours as needed.    [provider]  fluticasone (FLONASE) 50 MCG/ACT nasal spray Place 2 sprays into both nostrils daily. 04/25/18 07/26/18  Gildardo Pounds, NP  SUMAtriptan (IMITREX) 50 MG tablet Take one for headache.  May repeat in 2 hours.  No more than 2 a day Patient not taking: Reported on 02/09/2018 05/27/17 07/26/18  Raylene Everts, MD    Family History Family History  Problem Relation Age of Onset  . Cancer Father     Social History Social History   Tobacco Use  . Smoking status: Former Research scientist (life sciences)  . Smokeless tobacco: Never Used    Substance Use Topics  . Alcohol use: No  . Drug use: No     Allergies   Cyclobenzaprine, Methocarbamol, Amoxicillin, and Penicillins   Review of Systems Review of Systems  Gastrointestinal: Positive for rectal pain.  All other systems reviewed and are negative.    Physical Exam Triage Vital Signs ED Triage Vitals  Enc Vitals Group     BP      Pulse      Resp      Temp      Temp src      SpO2      Weight      Height      Head Circumference      Peak Flow      Pain Score      Pain Loc      Pain Edu?      Excl. in [place]?    No data found.  Updated Vital Signs There were no vitals taken for this visit.   Physical Exam Vitals and nursing note reviewed.  Constitutional:      General: He is not in acute distress.    Appearance: Normal appearance. He is normal weight.  Pulmonary:     Effort: Pulmonary effort is normal.  Genitourinary:    Comments: 6 mm erythematous  hemorrhoid at the anal verge at 5:00 Musculoskeletal:     Cervical back: Normal range of motion and neck supple.  Skin:    General: Skin is warm.  Neurological:     General: No focal deficit present.     Mental Status: He is alert.  Psychiatric:        Mood and Affect: Mood normal.        Behavior: Behavior normal.        Thought Content: Thought content normal.        Judgment: Judgment normal.      UC Treatments / Results  Labs (all labs ordered are listed, but only abnormal results are displayed) Labs Reviewed - No data to display  EKG   Radiology No results found.  Procedu Procedures (including critical care time)  Medications Ordered in UC Medications - No data to display  Initial Impression / Assessment and Plan / UC Course  I have reviewed the triage vital signs and the nursing notes.  Pertinent labs & imaging results that were available during my care of the patient were reviewed by me and considered in my medical decision making (see chart for details).    Final  Clinical Impressions(s) / UC Diagnoses   Final diagnoses:  Grade I hemorrhoids   Discharge Instructions   None    ED Prescriptions    Medication Sig Dispense Auth. Provider   hydrocortisone (ANUSOL-HC) 2.5 % rectal cream Place 1 application rectally 2 (two) times daily. 30 g Elvina Sidle, MD     I have reviewed the PDMP during this encounter.   Elvina Sidle, MD 05/17/19 2009

## 2019-07-21 ENCOUNTER — Ambulatory Visit (HOSPITAL_COMMUNITY)
Admission: EM | Admit: 2019-07-21 | Discharge: 2019-07-21 | Disposition: A | Discharge status: Home / Self Care | Payer: Self-pay | Attending: Urgent Care | Admitting: Urgent Care

## 2019-07-21 ENCOUNTER — Encounter (HOSPITAL_COMMUNITY): Payer: Self-pay | Admitting: Emergency Medicine

## 2019-07-21 ENCOUNTER — Encounter (HOSPITAL_COMMUNITY): Payer: Self-pay

## 2019-07-21 ENCOUNTER — Emergency Department (HOSPITAL_COMMUNITY)
Admission: EM | Admit: 2019-07-21 | Discharge: 2019-07-22 | Disposition: A | Discharge status: Home / Self Care | Payer: Self-pay | Attending: Emergency Medicine | Admitting: Emergency Medicine

## 2019-07-21 ENCOUNTER — Other Ambulatory Visit: Payer: Self-pay

## 2019-07-21 DIAGNOSIS — M7918 Myalgia, other site: Secondary | ICD-10-CM

## 2019-07-21 DIAGNOSIS — G4700 Insomnia, unspecified: Secondary | ICD-10-CM | POA: Insufficient documentation

## 2019-07-21 DIAGNOSIS — M549 Dorsalgia, unspecified: Secondary | ICD-10-CM

## 2019-07-21 DIAGNOSIS — M542 Cervicalgia: Secondary | ICD-10-CM

## 2019-07-21 DIAGNOSIS — M791 Myalgia, unspecified site: Secondary | ICD-10-CM | POA: Insufficient documentation

## 2019-07-21 DIAGNOSIS — F419 Anxiety disorder, unspecified: Secondary | ICD-10-CM

## 2019-07-21 DIAGNOSIS — Z87891 Personal history of nicotine dependence: Secondary | ICD-10-CM | POA: Insufficient documentation

## 2019-07-21 DIAGNOSIS — M62838 Other muscle spasm: Secondary | ICD-10-CM

## 2019-07-21 DIAGNOSIS — G44209 Tension-type headache, unspecified, not intractable: Secondary | ICD-10-CM | POA: Insufficient documentation

## 2019-07-21 DIAGNOSIS — R030 Elevated blood-pressure reading, without diagnosis of hypertension: Secondary | ICD-10-CM

## 2019-07-21 DIAGNOSIS — Z6379 Other stressful life events affecting family and household: Secondary | ICD-10-CM

## 2019-07-21 DIAGNOSIS — M62830 Muscle spasm of back: Secondary | ICD-10-CM

## 2019-07-21 DIAGNOSIS — J45909 Unspecified asthma, uncomplicated: Secondary | ICD-10-CM | POA: Insufficient documentation

## 2019-07-21 LAB — COMPREHENSIVE METABOLIC PANEL
ALT: 20 U/L (ref 0–44)
AST: 21 U/L (ref 15–41)
Albumin: 4.2 g/dL (ref 3.5–5.0)
Alkaline Phosphatase: 74 U/L (ref 38–126)
Anion gap: 9 (ref 5–15)
BUN: 8 mg/dL (ref 6–20)
CO2: 22 mmol/L (ref 22–32)
Calcium: 8.9 mg/dL (ref 8.9–10.3)
Chloride: 108 mmol/L (ref 98–111)
Creatinine, Ser: 0.72 mg/dL (ref 0.61–1.24)
GFR calc Af Amer: >60 mL/min (ref >60)
GFR calc non Af Amer: >60 mL/min (ref >60)
Glucose, Bld: 106 mg/dL — ABNORMAL HIGH (ref 70–99)
Potassium: 3.3 mmol/L — ABNORMAL LOW (ref 3.5–5.1)
Sodium: 139 mmol/L (ref 135–145)
Total Bilirubin: 0.7 mg/dL (ref 0.3–1.2)
Total Protein: 6.9 g/dL (ref 6.5–8.1)

## 2019-07-21 LAB — CBC WITH DIFFERENTIAL/PLATELET
Abs Immature Granulocytes: 0.03 10*3/uL (ref 0.00–0.07)
Basophils Absolute: 0 10*3/uL (ref 0.0–0.1)
Basophils Relative: 1 %
Eosinophils Absolute: 0.2 10*3/uL (ref 0.0–0.5)
Eosinophils Relative: 2 %
HCT: 44.1 % (ref 39.0–52.0)
Hemoglobin: 15.1 g/dL (ref 13.0–17.0)
Immature Granulocytes: 0 %
Lymphocytes Relative: 29 %
Lymphs Abs: 2.2 10*3/uL (ref 0.7–4.0)
MCH: 28.9 pg (ref 26.0–34.0)
MCHC: 34.2 g/dL (ref 30.0–36.0)
MCV: 84.3 fL (ref 80.0–100.0)
Monocytes Absolute: 0.6 10*3/uL (ref 0.1–1.0)
Monocytes Relative: 8 %
Neutro Abs: 4.5 10*3/uL (ref 1.7–7.7)
Neutrophils Relative %: 60 %
Platelets: 254 10*3/uL (ref 150–400)
RBC: 5.23 MIL/uL (ref 4.22–5.81)
RDW: 12.9 % (ref 11.5–15.5)
WBC: 7.5 10*3/uL (ref 4.0–10.5)
nRBC: 0 % (ref 0.0–0.2)

## 2019-07-21 LAB — CBG MONITORING, ED: Glucose-Capillary: 136 mg/dL — ABNORMAL HIGH (ref 70–99)

## 2019-07-21 MED ORDER — TIZANIDINE HCL 4 MG PO TABS: 4.0000 mg | ORAL_TABLET | Freq: Three times a day (TID) | ORAL | 0 refills | Status: DC | PRN

## 2019-07-21 MED ORDER — HYDROXYZINE HCL 25 MG PO TABS: 25.0000 mg | ORAL_TABLET | Freq: Every evening | ORAL | 0 refills | Status: DC | PRN

## 2019-07-21 MED ORDER — SODIUM CHLORIDE 0.9 % IV BOLUS
1000.0000 mL | Freq: Once | INTRAVENOUS | Status: AC
  Administered 2019-07-21: 1000 mL via INTRAVENOUS

## 2019-07-21 MED ORDER — NAPROXEN 500 MG PO TABS: 500.0000 mg | ORAL_TABLET | Freq: Two times a day (BID) | ORAL | 0 refills | Status: DC

## 2019-07-21 NOTE — ED Provider Notes (Signed)
Patient seen/examined in the Emergency Department in conjunction with Advanced Practice Provider [place] Patient reports dizziness and HA.  No cp/sob.  No syncope Exam : awake/alert, no distress.  No ataxia.   Plan: pt does not appear to have any signs of neurologic emergent condition.  Utilized Information systems manager during exam    Zadie Rhine, MD 07/21/19 [phone]

## 2019-07-21 NOTE — ED Triage Notes (Signed)
Pt states he is been feeling dizzy and with HA since yesterday at 1800, pt states he was seen on the UC today and they sent him home.  Pt states he has his BP elevated then he was sent home with medication for pain and sleeping pills. Pt states he doesn't have any relief.

## 2019-07-21 NOTE — ED Provider Notes (Signed)
[facility]   MRN: [id number] DOB: May 24, 1970  Subjective:   Gabriel Ball is a 49 y.o. male presenting for 3-week history of worsening anxiety secondary to family health issues.  His father recently had prostate surgery and had some complications.  His brother also had emergency surgery for complications related to his urinary catheter.  Patient has not been able to manage his stress well.  States that he has a difficult time sleeping, relaxing.  In addition to this he has been working excessive and long hours.  Tries to hydrate well but drinks 2-3 bottles of water a day.  Denies headache, confusion, weakness, slurred speech, hematuria, chest pain, belly pain.  Denies history of hypertension or diabetes.  He ate tortillas prior to coming in.  Denies falls, trauma.  No current facility-administered medications for this encounter.  Current Outpatient Medications:  .  acyclovir (ZOVIRAX) 200 MG capsule, Take 1 capsule (200 mg total) by mouth 3 (three) times daily. X 5 days prn, Disp: 60 capsule, Rfl: 1 .  cetirizine (ZYRTEC ALLERGY) 10 MG tablet, Take 1 tablet (10 mg total) by mouth daily., Disp: 90 tablet, Rfl: 3 .  ibuprofen (ADVIL) 200 MG tablet, Take 200 mg by mouth every 6 (six) hours as needed., Disp: , Rfl:    Allergies  Allergen Reactions  . Cyclobenzaprine     Facial Numbness/dizziness/hypersomnolence  . Methocarbamol     Abdominal pain  . Amoxicillin Itching    Pruritic rash  . Penicillins Palpitations    Has patient had a PCN reaction causing immediate rash, facial/tongue/throat swelling, SOB or lightheadedness with hypotension: No Has patient had a PCN reaction causing severe rash involving mucus membranes or skin necrosis: No Has patient had a PCN reaction that required hospitalization: No Has patient had a PCN reaction occurring within the last 10 years: No If all of the above answers are "NO", then may proceed with Cephalosporin use.    Past Medical  History:  Diagnosis Date  . Asthma   . Herpes   . Shingles      History reviewed. No pertinent surgical history.  Family History  Problem Relation Age of Onset  . Cancer Father     Social History   Tobacco Use  . Smoking status: Former Games developer  . Smokeless tobacco: Never Used  Substance Use Topics  . Alcohol use: No  . Drug use: No    ROS   Objective:   Vitals: BP (!) 169/147   Pulse 75   Temp 98.5 F (36.9 C) (Oral)   Resp 18   Ht 5\' 5"  (1.651 m)   Wt 185 lb (83.9 kg)   SpO2 99%   BMI 30.79 kg/m   BP was 138/76 on recheck.  BP Readings from Last 3 Encounters:  07/21/19 (!) 169/147  05/17/19 108/67  04/25/19 114/71   Physical Exam Constitutional:      General: He is not in acute distress.    Appearance: Normal appearance. He is well-developed. He is not ill-appearing, toxic-appearing or diaphoretic.  HENT:     Head: Normocephalic and atraumatic.     Right Ear: External ear normal.     Left Ear: External ear normal.     Nose: Nose normal.     Mouth/Throat:     Mouth: Mucous membranes are moist.     Pharynx: Oropharynx is clear.  Eyes:     General: No scleral icterus.       Right eye: No discharge.  Left eye: No discharge.     Extraocular Movements: Extraocular movements intact.     Conjunctiva/sclera: Conjunctivae normal.     Pupils: Pupils are equal, round, and reactive to light.  Cardiovascular:     Rate and Rhythm: Normal rate and regular rhythm.     Heart sounds: Normal heart sounds. No murmur heard.  No friction rub. No gallop.   Pulmonary:     Effort: Pulmonary effort is normal. No respiratory distress.     Breath sounds: Normal breath sounds. No stridor. No wheezing, rhonchi or rales.  Musculoskeletal:     Cervical back: Spasms and tenderness (Along trapezius muscles worse over spasms) present. No swelling, edema, deformity, erythema, signs of trauma, lacerations, rigidity, torticollis, bony tenderness or crepitus. Pain with  movement present. Decreased range of motion.     Comments: Negative Spurling and Lhermitte sign.  Skin:    General: Skin is warm and dry.  Neurological:     Mental Status: He is alert and oriented to person, place, and time.     Cranial Nerves: No cranial nerve deficit.     Motor: No weakness.     Coordination: Coordination normal.     Gait: Gait normal.     Deep Tendon Reflexes: Reflexes normal.  Psychiatric:        Mood and Affect: Mood normal.        Behavior: Behavior normal.        Thought Content: Thought content normal.        Judgment: Judgment normal.     Results for orders placed or performed during the hospital encounter of 07/21/19 (from the past 24 hour(s))  POC CBG monitoring     Status: Abnormal   Collection Time: 07/21/19 12:39 PM  Result Value Ref Range   Glucose-Capillary 136 (H) 70 - 99 mg/dL    Assessment and Plan :   PDMP not reviewed this encounter.  1. Neck pain   2. Upper back pain   3. Muscle spasm of back   4. Trapezius muscle spasm   5. Anxiety   6. Stress due to illness of family member   7. Elevated blood pressure reading in office without diagnosis of hypertension     Recommended patient use hydroxyzine for anxiety and insomnia.  Counseled on daily health maintenance.  Use naproxen, tizanidine for musculoskeletal type pain.  We will hold off on starting blood pressure medication as I think his elevated blood pressure reading is related to pain, lack of rest. Counseled patient on potential for adverse effects with medications prescribed/recommended today, ER and return-to-clinic precautions discussed, patient verbalized understanding.    Wallis Bamberg, PA-C 07/21/19 1322

## 2019-07-21 NOTE — ED Triage Notes (Signed)
Pt c/o 8/10 burning pain that starts in mid upper back and radiate into neck and head started last night. Pt denies injury. Pt states he also feels dizzy when he walks and it started last night. Pt able to move all extremities. Pt walked well to exam room. Pt denies numbness and tingling.

## 2019-07-21 NOTE — ED Notes (Signed)
I used ip[employer] interpreter to triage pt 

## 2019-07-21 NOTE — ED Provider Notes (Signed)
[facility] EMERGENCY DEPARTMENT Provider Note   CSN: [id number] Arrival date & time: 07/21/19  1918     History Chief Complaint  Patient presents with  . Dizziness    Gabriel Ball is a 49 y.o. male.  Patient to ED with complaint of pain across upper back, posterior neck and posterior head that radiates forward. Symptoms x 1-2 days. He states that since earlier today he feels off balance when he stands up from sitting or standing. He does not describe dizziness or near syncope. He feels mild nausea but no vomiting. He has been having difficulty sleeping over the last 2 weeks because his father and brother are seriously ill in [place] and he has been worrying about them. No chest pain, SOB, cough, fever, diarrhea. He was seen at Urgent Care earlier today and given medication for muscular pain (Naproxen, Zanaflex), and hydroxyzine to help him sleep. He was told his blood pressure was elevated while at Urgent Care today, but normal on recheck. No history of HTN. He came to the emergency department tonight for further evaluation secondary to persistent pain and the feeling of being off balance, and concern that his blood pressure was causing a dangerous problem.   The history is provided by the patient. No language interpreter was used.  Dizziness Associated symptoms: headaches and nausea   Associated symptoms: no weakness        Past Medical History:  Diagnosis Date  . Asthma   . Herpes   . Shingles     Patient Active Problem List   Diagnosis Date Noted  . Asthma due to environmental allergies 12/20/2016  . HSV (herpes simplex virus) infection 02/24/2015  . Anxiety 04/02/2014    History reviewed. No pertinent surgical history.     Family History  Problem Relation Age of Onset  . Cancer Father     Social History   Tobacco Use  . Smoking status: Former Games developer  . Smokeless tobacco: Never Used  Substance Use Topics  . Alcohol use: No  . Drug use:  No    Home Medications Prior to Admission medications   Medication Sig Start Date End Date Taking? Authorizing Provider  acyclovir (ZOVIRAX) 200 MG capsule Take 1 capsule (200 mg total) by mouth 3 (three) times daily. X 5 days prn 04/25/19   Anders Simmonds, PA-C  cetirizine (ZYRTEC ALLERGY) 10 MG tablet Take 1 tablet (10 mg total) by mouth daily. 11/13/18 02/11/19  Claiborne Rigg, NP  hydrOXYzine (ATARAX/VISTARIL) 25 MG tablet Take 1 tablet (25 mg total) by mouth at bedtime as needed (insomnia). 07/21/19   Wallis Bamberg, PA-C  ibuprofen (ADVIL) 200 MG tablet Take 200 mg by mouth every 6 (six) hours as needed.    [provider]  naproxen (NAPROSYN) 500 MG tablet Take 1 tablet (500 mg total) by mouth 2 (two) times daily with a meal. 07/21/19   Wallis Bamberg, PA-C  tiZANidine (ZANAFLEX) 4 MG tablet Take 1 tablet (4 mg total) by mouth every 8 (eight) hours as needed. 07/21/19   Wallis Bamberg, PA-C  fluticasone (FLONASE) 50 MCG/ACT nasal spray Place 2 sprays into both nostrils daily. 04/25/18 07/26/18  Claiborne Rigg, NP  SUMAtriptan (IMITREX) 50 MG tablet Take one for headache.  May repeat in 2 hours.  No more than 2 a day Patient not taking: Reported on 02/09/2018 05/27/17 07/26/18  Eustace Moore, MD    Allergies    Cyclobenzaprine, Methocarbamol, Amoxicillin, and Penicillins  Review of  Systems   Review of Systems  Constitutional: Negative for chills and fever.  HENT: Negative.   Eyes: Negative for visual disturbance.  Respiratory: Negative.   Cardiovascular: Negative.   Gastrointestinal: Positive for nausea.  Musculoskeletal: Negative.        See HPI.  Skin: Negative.   Neurological: Positive for light-headedness and headaches. Negative for syncope and weakness.    Physical Exam Updated Vital Signs BP 108/66   Pulse 62   Temp 99.1 F (37.3 C) (Oral)   Resp 16   Ht 5\' 5"  (1.651 m)   Wt 83.9 kg   SpO2 100%   BMI 30.78 kg/m   Physical Exam Vitals and nursing note reviewed.    Constitutional:      General: He is not in acute distress.    Appearance: Normal appearance. He is well-developed. He is not diaphoretic.  HENT:     Head: Normocephalic.  Eyes:     Conjunctiva/sclera: Conjunctivae normal.  Cardiovascular:     Rate and Rhythm: Normal rate and regular rhythm.     Heart sounds: No murmur heard.   Pulmonary:     Effort: Pulmonary effort is normal.     Breath sounds: Normal breath sounds. No wheezing, rhonchi or rales.  Chest:     Chest wall: No tenderness.  Abdominal:     General: Bowel sounds are normal.     Palpations: Abdomen is soft.     Tenderness: There is no abdominal tenderness. There is no guarding or rebound.  Musculoskeletal:        General: Normal range of motion.     Cervical back: Normal range of motion and neck supple.     Right lower leg: No edema.     Left lower leg: No edema.     Comments: Muscular tenderness of bilateral trapezius, paracervical spine and posterior scalp. No swelling.  Skin:    General: Skin is warm and dry.     Findings: No rash.  Neurological:     Mental Status: He is alert and oriented to person, place, and time.     ED Results / Procedures / Treatments   Labs (all labs ordered are listed, but only abnormal results are displayed) Labs Reviewed  COMPREHENSIVE METABOLIC PANEL - Abnormal; Notable for the following components:      Result Value   Potassium 3.3 (*)    Glucose, Bld 106 (*)    All other components within normal limits  CBC WITH DIFFERENTIAL/PLATELET   Results for orders placed or performed during the hospital encounter of 07/21/19  CBC with Differential  Result Value Ref Range   WBC 7.5 4.0 - 10.5 K/uL   RBC 5.23 4.22 - 5.81 MIL/uL   Hemoglobin 15.1 13.0 - 17.0 g/dL   HCT 09/21/19 39 - 52 %   MCV 84.3 80.0 - 100.0 fL   MCH 28.9 26.0 - 34.0 pg   MCHC 34.2 30.0 - 36.0 g/dL   RDW 95.6 21.3 - 08.6 %   Platelets 254 150 - 400 K/uL   nRBC 0.0 0.0 - 0.2 %   Neutrophils Relative % 60 %    Neutro Abs 4.5 1.7 - 7.7 K/uL   Lymphocytes Relative 29 %   Lymphs Abs 2.2 0.7 - 4.0 K/uL   Monocytes Relative 8 %   Monocytes Absolute 0.6 0 - 1 K/uL   Eosinophils Relative 2 %   Eosinophils Absolute 0.2 0 - 0 K/uL   Basophils Relative 1 %  Basophils Absolute 0.0 0 - 0 K/uL   Immature Granulocytes 0 %   Abs Immature Granulocytes 0.03 0.00 - 0.07 K/uL  Comprehensive metabolic panel  Result Value Ref Range   Sodium 139 135 - 145 mmol/L   Potassium 3.3 (L) 3.5 - 5.1 mmol/L   Chloride 108 98 - 111 mmol/L   CO2 22 22 - 32 mmol/L   Glucose, Bld 106 (H) 70 - 99 mg/dL   BUN 8 6 - 20 mg/dL   Creatinine, Ser 3.24 0.61 - 1.24 mg/dL   Calcium 8.9 8.9 - 40.1 mg/dL   Total Protein 6.9 6.5 - 8.1 g/dL   Albumin 4.2 3.5 - 5.0 g/dL   AST 21 15 - 41 U/L   ALT 20 0 - 44 U/L   Alkaline Phosphatase 74 38 - 126 U/L   Total Bilirubin 0.7 0.3 - 1.2 mg/dL   GFR calc non Af Amer >60 >60 mL/min   GFR calc Af Amer >60 >60 mL/min   Anion gap 9 5 - 15    EKG EKG Interpretation  Date/Time:  Saturday July 21 2019 21:52:54 EDT Ventricular Rate:  70 PR Interval:    QRS Duration: 111 QT Interval:  417 QTC Calculation: 450 R Axis:   81 Text Interpretation: Sinus rhythm RSR' in V1 or V2, right VCD or RVH Confirmed by Blane Ohara [phone]) on 07/21/2019 10:27:01 PM   Radiology No results found.  Procedures Procedures (including critical care time)  Medications Ordered in ED Medications - No data to display  ED Course  I have reviewed the triage vital signs and the nursing notes.  Pertinent labs & imaging results that were available during my care of the patient were reviewed by me and considered in my medical decision making (see chart for details).    MDM Rules/Calculators/A&P                          Patient to ED with ss/sxs as per HPI.   Discussed his symptoms and likely dehydration as contributing to positional symptoms. Labs unremarkable. EKG NSR. He ambulates in the room without  significant symptoms and is steady. Negative orthostatic VS.   IV fluids started. He is considered stable for discharge home. He is encouraged to continue medications as prescribed for muscular pain, tension headache, and to aid sleep.   He has been seen by Dr. Bebe Shaggy. Return precautions discussed.   Final Clinical Impression(s) / ED Diagnoses Final diagnoses:  None   1. Tension headache 2. Muscular pain 3. Insomnia  Rx / DC Orders ED Discharge Orders    None       Danne Harbor 07/22/19 Ned Clines, MD 07/23/19 0110

## 2019-07-22 NOTE — ED Notes (Signed)
Patient verbalizes understanding of discharge instructions. Opportunity for questioning and answers were provided. Armband removed by staff, pt discharged from ED. Pt. ambulatory and discharged home.  

## 2019-07-22 NOTE — Discharge Instructions (Addendum)
Continue your prescribed medications. Increase your water intake to avoid dehydration.   Please follow up with your doctor for recheck to insure you are getting better. Return to the emergency department with any new or worsening symptoms.

## 2019-07-25 ENCOUNTER — Ambulatory Visit (HOSPITAL_COMMUNITY): Admission: EM | Admit: 2019-07-25 | Discharge: 2019-07-25 | Payer: Self-pay

## 2019-07-25 ENCOUNTER — Encounter (HOSPITAL_COMMUNITY): Payer: Self-pay

## 2019-07-25 ENCOUNTER — Emergency Department (HOSPITAL_COMMUNITY)
Admission: EM | Admit: 2019-07-25 | Discharge: 2019-07-26 | Disposition: A | Discharge status: Home / Self Care | Payer: Self-pay | Attending: Emergency Medicine | Admitting: Emergency Medicine

## 2019-07-25 ENCOUNTER — Encounter (HOSPITAL_COMMUNITY): Payer: Self-pay | Admitting: Emergency Medicine

## 2019-07-25 ENCOUNTER — Other Ambulatory Visit: Payer: Self-pay

## 2019-07-25 ENCOUNTER — Emergency Department (HOSPITAL_COMMUNITY): Payer: Self-pay

## 2019-07-25 DIAGNOSIS — R200 Anesthesia of skin: Secondary | ICD-10-CM

## 2019-07-25 DIAGNOSIS — R202 Paresthesia of skin: Secondary | ICD-10-CM

## 2019-07-25 DIAGNOSIS — Z79899 Other long term (current) drug therapy: Secondary | ICD-10-CM | POA: Insufficient documentation

## 2019-07-25 DIAGNOSIS — F419 Anxiety disorder, unspecified: Secondary | ICD-10-CM | POA: Insufficient documentation

## 2019-07-25 DIAGNOSIS — R519 Headache, unspecified: Secondary | ICD-10-CM

## 2019-07-25 DIAGNOSIS — Z87891 Personal history of nicotine dependence: Secondary | ICD-10-CM | POA: Insufficient documentation

## 2019-07-25 DIAGNOSIS — R42 Dizziness and giddiness: Secondary | ICD-10-CM | POA: Insufficient documentation

## 2019-07-25 LAB — DIFFERENTIAL
Abs Immature Granulocytes: 0.03 10*3/uL (ref 0.00–0.07)
Basophils Absolute: 0.1 10*3/uL (ref 0.0–0.1)
Basophils Relative: 1 %
Eosinophils Absolute: 0.1 10*3/uL (ref 0.0–0.5)
Eosinophils Relative: 2 %
Immature Granulocytes: 0 %
Lymphocytes Relative: 21 %
Lymphs Abs: 1.8 10*3/uL (ref 0.7–4.0)
Monocytes Absolute: 0.5 10*3/uL (ref 0.1–1.0)
Monocytes Relative: 5 %
Neutro Abs: 6.3 10*3/uL (ref 1.7–7.7)
Neutrophils Relative %: 71 %

## 2019-07-25 LAB — CBC
HCT: 45.6 % (ref 39.0–52.0)
Hemoglobin: 15.1 g/dL (ref 13.0–17.0)
MCH: 28.2 pg (ref 26.0–34.0)
MCHC: 33.1 g/dL (ref 30.0–36.0)
MCV: 85.1 fL (ref 80.0–100.0)
Platelets: 231 10*3/uL (ref 150–400)
RBC: 5.36 MIL/uL (ref 4.22–5.81)
RDW: 13 % (ref 11.5–15.5)
WBC: 8.8 10*3/uL (ref 4.0–10.5)
nRBC: 0 % (ref 0.0–0.2)

## 2019-07-25 LAB — PROTIME-INR
INR: 1.1 (ref 0.8–1.2)
Prothrombin Time: 13.3 seconds (ref 11.4–15.2)

## 2019-07-25 LAB — COMPREHENSIVE METABOLIC PANEL
ALT: 24 U/L (ref 0–44)
AST: 20 U/L (ref 15–41)
Albumin: 4.4 g/dL (ref 3.5–5.0)
Alkaline Phosphatase: 69 U/L (ref 38–126)
Anion gap: 9 (ref 5–15)
BUN: 18 mg/dL (ref 6–20)
CO2: 23 mmol/L (ref 22–32)
Calcium: 9 mg/dL (ref 8.9–10.3)
Chloride: 107 mmol/L (ref 98–111)
Creatinine, Ser: 0.85 mg/dL (ref 0.61–1.24)
GFR calc Af Amer: >60 mL/min (ref >60)
GFR calc non Af Amer: >60 mL/min (ref >60)
Glucose, Bld: 102 mg/dL — ABNORMAL HIGH (ref 70–99)
Potassium: 4.1 mmol/L (ref 3.5–5.1)
Sodium: 139 mmol/L (ref 135–145)
Total Bilirubin: 0.6 mg/dL (ref 0.3–1.2)
Total Protein: 7.2 g/dL (ref 6.5–8.1)

## 2019-07-25 LAB — I-STAT CHEM 8, ED
BUN: 22 mg/dL — ABNORMAL HIGH (ref 6–20)
Calcium, Ion: 1.21 mmol/L (ref 1.15–1.40)
Chloride: 104 mmol/L (ref 98–111)
Creatinine, Ser: 0.8 mg/dL (ref 0.61–1.24)
Glucose, Bld: 100 mg/dL — ABNORMAL HIGH (ref 70–99)
HCT: 45 % (ref 39.0–52.0)
Hemoglobin: 15.3 g/dL (ref 13.0–17.0)
Potassium: 4.5 mmol/L (ref 3.5–5.1)
Sodium: 142 mmol/L (ref 135–145)
TCO2: 23 mmol/L (ref 22–32)

## 2019-07-25 LAB — APTT: aPTT: 32 seconds (ref 24–36)

## 2019-07-25 MED ORDER — SODIUM CHLORIDE 0.9% FLUSH: 3.0000 mL | Freq: Once | INTRAVENOUS | Status: DC

## 2019-07-25 NOTE — ED Provider Notes (Signed)
[facility]   MRN: [id number] DOB: 1970-06-17  Subjective:   Gabriel Ball is a 49 y.o. male presenting for persistent malaise.  He is now having recurrent sense of paralysis, numbness and tingling.  This occurred over the left arm last week.  Then it started to happen over the right side of his body and is now persisting over the right side of his face.  Reports that he keeps feeling tingling and burning of the right side of his head and feels like his thoughts are fleeting.  No current facility-administered medications for this encounter.  Current Outpatient Medications:  .  acyclovir (ZOVIRAX) 200 MG capsule, Take 1 capsule (200 mg total) by mouth 3 (three) times daily. X 5 days prn, Disp: 60 capsule, Rfl: 1 .  cetirizine (ZYRTEC ALLERGY) 10 MG tablet, Take 1 tablet (10 mg total) by mouth daily., Disp: 90 tablet, Rfl: 3 .  hydrOXYzine (ATARAX/VISTARIL) 25 MG tablet, Take 1 tablet (25 mg total) by mouth at bedtime as needed (insomnia)., Disp: 30 tablet, Rfl: 0 .  ibuprofen (ADVIL) 200 MG tablet, Take 200 mg by mouth every 6 (six) hours as needed., Disp: , Rfl:  .  naproxen (NAPROSYN) 500 MG tablet, Take 1 tablet (500 mg total) by mouth 2 (two) times daily with a meal., Disp: 30 tablet, Rfl: 0 .  tiZANidine (ZANAFLEX) 4 MG tablet, Take 1 tablet (4 mg total) by mouth every 8 (eight) hours as needed., Disp: 30 tablet, Rfl: 0   Allergies  Allergen Reactions  . Cyclobenzaprine     Facial Numbness/dizziness/hypersomnolence  . Methocarbamol     Abdominal pain  . Amoxicillin Itching    Pruritic rash  . Penicillins Palpitations    Has patient had a PCN reaction causing immediate rash, facial/tongue/throat swelling, SOB or lightheadedness with hypotension: No Has patient had a PCN reaction causing severe rash involving mucus membranes or skin necrosis: No Has patient had a PCN reaction that required hospitalization: No Has patient had a PCN reaction occurring within the last 10  years: No If all of the above answers are "NO", then may proceed with Cephalosporin use.    Past Medical History:  Diagnosis Date  . Asthma   . Herpes   . Shingles      No past surgical history on file.  Family History  Problem Relation Age of Onset  . Cancer Father     Social History   Tobacco Use  . Smoking status: Former Games developer  . Smokeless tobacco: Never Used  Substance Use Topics  . Alcohol use: No  . Drug use: No    ROS   Objective:   Vitals: BP 109/79 (BP Location: Right Arm)   Pulse 86   Temp 99.1 F (37.3 C) (Oral)   Resp 18   SpO2 97%   BP Readings from Last 3 Encounters:  07/22/19 109/63  07/21/19 (!) 169/147  05/17/19 108/67   Physical Exam Constitutional:      General: He is not in acute distress.    Appearance: Normal appearance. He is well-developed and normal weight. He is not ill-appearing, toxic-appearing or diaphoretic.  HENT:     Head: Normocephalic and atraumatic.     Right Ear: External ear normal.     Left Ear: External ear normal.     Nose: Nose normal.     Mouth/Throat:     Mouth: Mucous membranes are moist.     Pharynx: Oropharynx is clear.  Eyes:     General:  No scleral icterus.       Right eye: No discharge.        Left eye: No discharge.     Extraocular Movements: Extraocular movements intact.     Pupils: Pupils are equal, round, and reactive to light.  Cardiovascular:     Rate and Rhythm: Normal rate and regular rhythm.     Heart sounds: Normal heart sounds. No murmur heard.  No friction rub. No gallop.   Pulmonary:     Effort: Pulmonary effort is normal. No respiratory distress.     Breath sounds: Normal breath sounds. No stridor. No wheezing, rhonchi or rales.  Musculoskeletal:     Cervical back: Normal range of motion.  Skin:    General: Skin is warm and dry.     Findings: No rash.  Neurological:     Mental Status: He is alert and oriented to person, place, and time.     Cranial Nerves: No cranial nerve  deficit or facial asymmetry.     Motor: No weakness.     Coordination: Romberg sign negative. Coordination normal.     Gait: Gait normal.     Deep Tendon Reflexes: Reflexes normal.     Comments: Negative Romberg and pronator drift.  Psychiatric:        Mood and Affect: Mood is anxious. Mood is not depressed.        Speech: Speech normal.        Behavior: Behavior normal. Behavior is not agitated.        Thought Content: Thought content normal.        Judgment: Judgment normal.       Assessment and Plan :   PDMP not reviewed this encounter.  1. Numbness and tingling in left arm   2. Tingling of face   3. Bad headache     Suspect underlying untreated anxiety.  No signs of stroke, Bell's palsy.  However, given patient's symptoms set recommended head CT to rule out intracranial process.  He has not been compliant with medications prescribed and highly emphasized to consider medication for anxiety if his head CT is normal once again.  Patient states that he will consider this.  Follow-up with PCP ASAP after ER visit.   Wallis Bamberg, PA-C 07/25/19 1901

## 2019-07-25 NOTE — ED Triage Notes (Signed)
Last Saturday was seen, treatment not help.  Pain on right side of head.  Tingling feeling.  patient is shutting eyes, equally.  Able to drink without drooling.  Facial expression not symmetrical, but patient is frequently squinting.  Denies any weakness .  Intermittent throbbing, nerve pain in right elbow

## 2019-07-25 NOTE — ED Triage Notes (Signed)
Pt arrives POV for eval of head pain that feels like "something walking on his nerves". Pt was seen on 7/3 for same complaints, but has not improved since that time. Pt reports the sensation starts on the top of his head and goes down the neck. Localized to the "right side of his brain". Pt also reports that his eyes feel dry and he is unable to move his eyes like normal. Pt states "the nerve feels hard". L sided facial asymmetry noted in triage, present since Saturday. No drift on exam

## 2019-07-25 NOTE — Discharge Instructions (Signed)
Please report to the ER now for a head CT given your symptom set of weakness, numbness and tingling of left arm, right face. A head CT will help rule out an intracranial process.

## 2019-07-26 ENCOUNTER — Emergency Department (HOSPITAL_COMMUNITY): Payer: Self-pay

## 2019-07-26 LAB — CBG MONITORING, ED: Glucose-Capillary: 81 mg/dL (ref 70–99)

## 2019-07-26 MED ORDER — LORAZEPAM 2 MG/ML IJ SOLN
0.5000 mg | Freq: Once | INTRAMUSCULAR | Status: AC
  Administered 2019-07-26: 0.5 mg via INTRAMUSCULAR
  Filled 2019-07-26: qty 1

## 2019-07-26 MED ORDER — LORAZEPAM 2 MG/ML IJ SOLN: 0.5000 mg | Freq: Once | INTRAMUSCULAR | Status: DC

## 2019-07-26 MED ORDER — DIAZEPAM 5 MG PO TABS
5.0000 mg | ORAL_TABLET | Freq: Two times a day (BID) | ORAL | 0 refills | Status: DC | PRN
Start: 2019-07-26 — End: 2019-08-08

## 2019-07-26 NOTE — ED Provider Notes (Signed)
[facility] EMERGENCY DEPARTMENT Provider Note   CSN: [id number] Arrival date & time: 07/25/19  1946    History Chief Complaint  Patient presents with  . Arm Numbness    Gabriel Ball is a 49 y.o. male with past medical history significant for shingles, asthma, Bell's palsy who presents for evaluation of paresthesias.  Patient states he has had a sensation of numbness and "odd feeling" to the right side of his head.  Patient states last week he had left arm tingling which resolved and now he has a right upper extremity tingling.  Patient states he feels this on his face as well.  Patient feels like the right side of his face is "full."  He does admit to anxiety due to sick family members.  He was seen by urgent care today and sent here to emergency department for evaluation.  She has had longstanding history of anxiety as well as multiple urgent care and ED visits for paresthesias to his head and face.  He denies prior history of stroke or weakness.  No recent injury or trauma.  Patient states he does have a very minimal facial droop from prior episode of Bell's palsy.  He does have sensation of bilateral eye watering.  No congestion or rhinorrhea.  Was seen last week here in the ED for headache.  Patient denies any prior history of sudden onset thunderclap headache.  Patient states occasionally the tingling will extend throughout his posterior neck however he denies any neck pain, neck stiffness or rigidity.  No personal or family history of aneurysms.  No visual field cuts.  Patient denies any worsening facial droop, difficulty with word finding, weakness, chest pain, shortness of breath, fever, chills, nausea, vomiting, abdominal pain, diarrhea, dysuria, unilateral or bilateral weakness.  Denies additional aggravating relieving factors.  He was given medication for possible muscle strain 1 week ago however patient states the naproxen and Zanaflex has not been working.  He denies  any current pain to his neck or head.  She states he feels a "odd sensation" to the right posterior aspect of his head.  Does state that his anxiety has been worsening over the last few weeks due to family members being sick who are out of the country.  He is not seen by psychiatry or neurology for these issues.  No known Covid exposures.  Does state that he has had some intermittent dizziness which is worse with positional movement.  States this is positional and movement related.  He has no dizziness or lightheadedness when he is sitting.  History obtained from patient and past medical records.  Medical Spanish interpreter used.  HPI     Past Medical History:  Diagnosis Date  . Asthma   . Herpes   . Shingles     Patient Active Problem List   Diagnosis Date Noted  . Asthma due to environmental allergies 12/20/2016  . HSV (herpes simplex virus) infection 02/24/2015  . Anxiety 04/02/2014    History reviewed. No pertinent surgical history.     Family History  Problem Relation Age of Onset  . Cancer Father     Social History   Tobacco Use  . Smoking status: Former Games developer  . Smokeless tobacco: Never Used  Substance Use Topics  . Alcohol use: No  . Drug use: No    Home Medications Prior to Admission medications   Medication Sig Start Date End Date Taking? Authorizing Provider  hydrOXYzine (ATARAX/VISTARIL) 25 MG tablet Take 1  tablet (25 mg total) by mouth at bedtime as needed (insomnia). 07/21/19  Yes Wallis BambergMani, Mario, PA-C  ibuprofen (ADVIL) 200 MG tablet Take 200 mg by mouth every 6 (six) hours as needed.   Yes [provider]  naproxen (NAPROSYN) 500 MG tablet Take 1 tablet (500 mg total) by mouth 2 (two) times daily with a meal. 07/21/19  Yes Wallis BambergMani, Mario, PA-C  tiZANidine (ZANAFLEX) 4 MG tablet Take 1 tablet (4 mg total) by mouth every 8 (eight) hours as needed. 07/21/19  Yes Wallis BambergMani, Mario, PA-C  acyclovir (ZOVIRAX) 200 MG capsule Take 1 capsule (200 mg total) by mouth 3  (three) times daily. X 5 days prn Patient not taking: Reported on 07/26/2019 04/25/19   Anders SimmondsMcClung, Angela M, PA-C  cetirizine (ZYRTEC ALLERGY) 10 MG tablet Take 1 tablet (10 mg total) by mouth daily. 11/13/18 02/11/19  Claiborne RiggFleming, Zelda W, NP  diazepam (VALIUM) 5 MG tablet Take 1 tablet (5 mg total) by mouth every 12 (twelve) hours as needed for anxiety. 07/26/19   Dalten Ambrosino A, PA-C  fluticasone (FLONASE) 50 MCG/ACT nasal spray Place 2 sprays into both nostrils daily. 04/25/18 07/26/18  Claiborne RiggFleming, Zelda W, NP  SUMAtriptan (IMITREX) 50 MG tablet Take one for headache.  May repeat in 2 hours.  No more than 2 a day Patient not taking: Reported on 02/09/2018 05/27/17 07/26/18  Eustace MooreNelson, Yvonne Sue, MD    Allergies    Cyclobenzaprine, Methocarbamol, Amoxicillin, and Penicillins  Review of Systems   Review of Systems  Constitutional: Negative.   HENT: Negative.   Respiratory: Negative.   Cardiovascular: Negative.   Gastrointestinal: Negative.   Genitourinary: Negative.   Musculoskeletal: Negative.   Skin: Negative.   Neurological: Positive for dizziness (Positional) and facial asymmetry. Negative for tremors, seizures, syncope, speech difficulty, weakness, light-headedness and headaches. Numbness: Tingling, intermittent.  All other systems reviewed and are negative.  Physical Exam Updated Vital Signs BP 105/61   Pulse 61   Temp 98.6 F (37 C) (Oral)   Resp 11   Ht 5\' 5"  (1.651 m)   Wt 84 kg   SpO2 100%   BMI 30.82 kg/m   Physical Exam  Physical Exam  Constitutional: Pt is oriented to person, place, and time. Pt appears well-developed and well-nourished. No distress.  HENT:  Head: Normocephalic and atraumatic.  Mouth/Throat: Oropharynx is clear and moist.  Eyes: Conjunctivae and EOM are normal. Pupils are equal, round, and reactive to light. No scleral icterus.  1 beat horizontal nystagmus to right Neck: Normal range of motion. Neck supple.  Full active and passive ROM without pain No midline  or paraspinal tenderness No nuchal rigidity or meningeal signs  Cardiovascular: Normal rate, regular rhythm and intact distal pulses.   Pulmonary/Chest: Effort normal and breath sounds normal. No respiratory distress. Pt has no wheezes. No rales.  Abdominal: Soft. Bowel sounds are normal. There is no tenderness. There is no rebound and no guarding.  Musculoskeletal: Normal range of motion.  Lymphadenopathy:    No cervical adenopathy.  Neurological: Pt. is alert and oriented to person, place, and time. He has normal reflexes. No cranial nerve deficit.  Exhibits normal muscle tone. Coordination normal.  Mental Status:  Alert, oriented, thought content appropriate. Speech fluent without evidence of aphasia. Able to follow 2 step commands without difficulty.  Cranial Nerves:  II:  Peripheral visual fields grossly normal, pupils equal, round, reactive to light III,IV, VI: ptosis not present, extra-ocular motions intact bilaterally  V,VII: smile asymmetric on left, slight  droop, patient states is chronic, facial light touch sensation equal VIII: hearing grossly normal bilaterally  IX,X: midline uvula rise  XI: bilateral shoulder shrug equal and strong XII: midline tongue extension  Motor:  5/5 in upper and lower extremities bilaterally including strong and equal grip strength and dorsiflexion/plantar flexion Sensory: Pinprick and light touch normal in all extremities.  Deep Tendon Reflexes: 2+ and symmetric  Cerebellar: normal finger-to-nose with bilateral upper extremities Gait: normal gait and balance CV: distal pulses palpable throughout   Skin: Skin is warm and dry. No rash noted. Pt is not diaphoretic.  Psychiatric: Pt has a normal mood and affect. Behavior is normal. Judgment and thought content normal.  Nursing note and vitals reviewed. ED Results / Procedures / Treatments   Labs (all labs ordered are listed, but only abnormal results are displayed) Labs Reviewed  COMPREHENSIVE  METABOLIC PANEL - Abnormal; Notable for the following components:      Result Value   Glucose, Bld 102 (*)    All other components within normal limits  I-STAT CHEM 8, ED - Abnormal; Notable for the following components:   BUN 22 (*)    Glucose, Bld 100 (*)    All other components within normal limits  PROTIME-INR  APTT  CBC  DIFFERENTIAL  CBG MONITORING, ED    EKG None  Radiology CT HEAD WO CONTRAST  Result Date: 07/25/2019 CLINICAL DATA:  Right-sided head pain EXAM: CT HEAD WITHOUT CONTRAST TECHNIQUE: Contiguous axial images were obtained from the base of the skull through the vertex without intravenous contrast. COMPARISON:  CT and MRI brain 05/15/2017 FINDINGS: Brain: No acute territorial infarction, hemorrhage, or intracranial mass. The ventricles nonenlarged. Vascular: No hyperdense vessel or unexpected calcification. Skull: Normal. Negative for fracture or focal lesion. Sinuses/Orbits: No acute finding. Mild mucosal thickening in the sinuses. Other: None IMPRESSION: Negative non contrasted CT appearance of the brain. Electronically Signed   By: Jasmine Pang M.D.   On: 07/25/2019 22:57   MR BRAIN WO CONTRAST  Result Date: 07/26/2019 CLINICAL DATA:  Head pain, left-sided facial asymmetry EXAM: MRI HEAD WITHOUT CONTRAST TECHNIQUE: Multiplanar, multiecho pulse sequences of the brain and surrounding structures were obtained without intravenous contrast. COMPARISON:  05/15/2017 FINDINGS: Brain: There is no acute infarction or intracranial hemorrhage. There is no intracranial mass, mass effect, or edema. There is no hydrocephalus or extra-axial fluid collection. Ventricles and sulci are normal in size and configuration. A small focus of T2 hyperintensity is again identified in the left frontal white matter likely reflecting nonspecific gliosis/demyelination of questionable clinical significance. Vascular: Major vessel flow voids at the skull base are preserved. Skull and upper cervical spine:  Normal marrow signal is preserved. Sinuses/Orbits: Trace mucosal thickening.  Orbits are unremarkable. Other: Sella is unremarkable.  Mastoid air cells are clear. IMPRESSION: No acute or significant abnormality. Electronically Signed   By: Guadlupe Spanish M.D.   On: 07/26/2019 15:38   MR Cervical Spine Wo Contrast  Result Date: 07/26/2019 CLINICAL DATA:  Paresthesias. EXAM: MRI CERVICAL SPINE WITHOUT CONTRAST TECHNIQUE: Multiplanar, multisequence MR imaging of the cervical spine was performed. No intravenous contrast was administered. COMPARISON:  None. FINDINGS: Examination is significantly limited by patient motion. Alignment: Normal Vertebrae: Grossly normal Cord: Very limited examination but no gross abnormality. Posterior Fossa, vertebral arteries, paraspinal tissues: Grossly normal. Disc levels: No obvious large disc protrusions, significant spinal or foraminal stenosis. IMPRESSION: 1. Very limited examination due to patient motion. 2. No obvious large disc protrusions, spinal or foraminal stenosis. 3.  No obvious cord lesions. 4. If patient's symptoms persist or worsen patient may need repeat imaging with sedation and without and with contrast. Electronically Signed   By: Rudie Meyer M.D.   On: 07/26/2019 16:13    Procedures Procedures (including critical care time)  Medications Ordered in ED Medications  sodium chloride flush (NS) 0.9 % injection 3 mL (has no administration in time range)  LORazepam (ATIVAN) injection 0.5 mg (0.5 mg Intramuscular Given 07/26/19 1456)   ED Course  I have reviewed the triage vital signs and the nursing notes.  Pertinent labs & imaging results that were available during my care of the patient were reviewed by me and considered in my medical decision making (see chart for details).  49 year old male presents for evaluation of paresthesias.  He is afebrile, nonseptic, not ill-appearing.  Patient with "odd and tingling" sensation to the right posterior aspect of  his head.  Has had intermittent paresthesias to bilateral upper extremities.  Began last week with left which resolved and is now present right.  Has had increased anxiety due to sick family members at the country.  Has also been having the sensation to the posterior aspect of his neck.  He has no neck stiffness or neck rigidity.  He has no meningismus.  Low suspicion for meningitis.  He denies any sudden onset thunderclap headache.  Patient has been seen multiple times in the past for similar complaints at urgent care as well as in the ED.  His last MRI in 2019 was negative for acute findings.  He has very minimal left facial droop which patient states he thinks is chronic due to prior Bell's palsy.  No associated headache or any current pain.  Remaining neuro exam is without significant findings.  He had CT head and imaging obtained from triage which I personally reviewed and interpreted  CBC without leukocytosis, hemoglobin 15.1 Metabolic panel with mild hyperglycemia to 102 over noted electrolyte, renal or liver normality CT head negative  Shared decision making with patient for MRI given his facial droop.  I do not see record of this during his prior ED visits however this is very subtle. Patient states he thinks this is chronic from prior bells palsy episode. Low suspicion for infectious etiology of symptoms.  1105: Patient states he is unsure if he wants to wait for MRI. States he is hungry. Discussed risk vs benefit of leaving WO MRI with translator. Patient agreeable to wait  1615: Patient reassessed. MRIs without significant findings. Feel likely symptoms are multifactorial in nature. We will give him some diazepam for his dizziness which will also help him with his anxiety. Given he has been seen multiple times for this in the past of paresthesias. We will have him follow-up with neurology. He has been given resources outpatient. Again patient reiterates with Spanish interpreter that his facial  droop is not new. Low suspicion for acute CVA, TIA, intracranial abnormality or acute neurosurgical emergency.  The patient has been appropriately medically screened and/or stabilized in the ED. I have low suspicion for any other emergent medical condition which would require further screening, evaluation or treatment in the ED or require inpatient management.  Patient is hemodynamically stable and in no acute distress.  Patient able to ambulate in department prior to ED.  Evaluation does not show acute pathology that would require ongoing or additional emergent interventions while in the emergency department or further inpatient treatment.  I have discussed the diagnosis with the patient and answered  all questions.  Pain is been managed while in the emergency department and patient has no further complaints prior to discharge.  Patient is comfortable with plan discussed in room and is stable for discharge at this time.  I have discussed strict return precautions for returning to the emergency department.  Patient was encouraged to follow-up with PCP/specialist refer to at discharge.    MDM Rules/Calculators/A&P                          Final Clinical Impression(s) / ED Diagnoses Final diagnoses:  Numbness and tingling  Dizziness  Anxiety    Rx / DC Orders ED Discharge Orders         Ordered    diazepam (VALIUM) 5 MG tablet  Every 12 hours PRN     Discontinue  Reprint     07/26/19 1624           Taydon Nasworthy A, PA-C 07/26/19 1626    Bethann Berkshire, MD 07/26/19 1630

## 2019-07-26 NOTE — ED Notes (Signed)
Patient transported to MRI 

## 2019-08-01 NOTE — Progress Notes (Signed)
Patient ID: Gabriel Ball, male   DOB: October 16, 1970, 49 y.o.   MRN: [id number]    Virtual Visit via Telephone Note  I connected with Adler Colquitt on 08/08/19 at  1:50 PM EDT by telephone and verified that I am speaking with the correct person using two identifiers.   I discussed the limitations, risks, security and privacy concerns of performing an evaluation and management service by telephone and the availability of in person appointments. I also discussed with the patient that there may be a patient responsible charge related to this service. The patient expressed understanding and agreed to proceed.   PATIENT visit by telephone virtually in the context of Covid-19 pandemic. Patient location:  home My Location:  [facility] office Persons on the call:  Me, interpreter and the patient  History of Present Illness: After being seen in the ED  07/26/2019 for paresthesias in his head.  Has partial facial droop from previous bell's palsy.  Neg CT head without contrast, neg MRI brain w/o contrast, neg MRI C-spine w/o contrast.  Thyroid last checked 01/2018 and wnl.  He does not want to take a medication that he has to take daily for anxiety.  No further issues with his head.    From ED A/P: 49 year old male presents for evaluation of paresthesias.  He is afebrile, nonseptic, not ill-appearing.  Patient with "odd and tingling" sensation to the right posterior aspect of his head.  Has had intermittent paresthesias to bilateral upper extremities.  Began last week with left which resolved and is now present right.  Has had increased anxiety due to sick [place] members at the country.  Has also been having the sensation to the posterior aspect of his neck.  He has no neck stiffness or neck rigidity.  He has no meningismus.  Low suspicion for meningitis.  He denies any sudden onset thunderclap headache.  Patient has been seen multiple times in the past for similar complaints at urgent care as well as in the  ED.  His last MRI in 2019 was negative for acute findings.  He has very minimal left facial droop which patient states he thinks is chronic due to prior Bell's palsy.  No associated headache or any current pain.  Remaining neuro exam is without significant findings.  He had CT head and imaging obtained from triage which I personally reviewed and interpreted  CBC without leukocytosis, hemoglobin 15.1 Metabolic panel with mild hyperglycemia to 102 over noted electrolyte, renal or liver normality CT head negative  Shared decision making with patient for MRI given his facial droop.  I do not see record of this during his prior ED visits however this is very subtle. Patient states he thinks this is chronic from prior bells palsy episode. Low suspicion for infectious etiology of symptoms.  1105: Patient states he is unsure if he wants to wait for MRI. States he is hungry. Discussed risk vs benefit of leaving WO MRI with translator. Patient agreeable to wait  1615: Patient reassessed. MRIs without significant findings. Feel likely symptoms are multifactorial in nature. We will give him some diazepam for his dizziness which will also help him with his anxiety. Given he has been seen multiple times for this in the past of paresthesias. We will have him follow-up with neurology. He has been given resources outpatient. Again patient reiterates with Spanish interpreter that his facial droop is not new. Low suspicion for acute CVA, TIA, intracranial abnormality or acute neurosurgical emergency.  The patient has been  appropriately medically screened and/or stabilized in the ED. I have low suspicion for any other emergent medical condition which would require further screening, evaluation or treatment in the ED or require inpatient management.  Patient is hemodynamically stable and in no acute distress.  Patient able to ambulate in department prior to ED.  Evaluation does not show acute pathology that would  require ongoing or additional emergent interventions while in the emergency department or further inpatient treatment.  I have discussed the diagnosis with the patient and answered all questions.  Pain is been managed while in the emergency department and patient has no further complaints prior to discharge.  Patient is comfortable with plan discussed in room and is stable for discharge at this time.  I have discussed strict return precautions for returning to the emergency department.  Patient was encouraged to follow-up with PCP/specialist refer to at discharge.    Observations/Objective:  NAD.  A&Ox3   Assessment and Plan:   1. Paresthesias Resolved/consider neuro referral if it becomes a problem again  2. Anxiety - Ambulatory referral to Social Work - hydrOXYzine (ATARAX/VISTARIL) 25 MG tablet; 1 tablet tid prn anxiety  Dispense: 60 tablet; Refill: 1  3. Language barrier stratus interpreters used and additional time performing visit was required.  4. Encounter for examination following treatment at hospital    Follow Up Instructions: See PCP in 1 month   I discussed the assessment and treatment plan with the patient. The patient was provided an opportunity to ask questions and all were answered. The patient agreed with the plan and demonstrated an understanding of the instructions.   The patient was advised to call back or seek an in-person evaluation if the symptoms worsen or if the condition fails to improve as anticipated.  I provided 17 minutes of non-face-to-face time during this encounter.   Georgian Co, PA-C

## 2019-08-08 ENCOUNTER — Ambulatory Visit: Payer: Self-pay | Attending: Physician Assistant | Admitting: Physician Assistant

## 2019-08-08 ENCOUNTER — Other Ambulatory Visit: Payer: Self-pay

## 2019-08-08 DIAGNOSIS — R202 Paresthesia of skin: Secondary | ICD-10-CM

## 2019-08-08 DIAGNOSIS — F419 Anxiety disorder, unspecified: Secondary | ICD-10-CM

## 2019-08-08 DIAGNOSIS — Z789 Other specified health status: Secondary | ICD-10-CM

## 2019-08-08 DIAGNOSIS — Z09 Encounter for follow-up examination after completed treatment for conditions other than malignant neoplasm: Secondary | ICD-10-CM

## 2019-08-08 MED ORDER — HYDROXYZINE HCL 25 MG PO TABS: ORAL_TABLET | ORAL | 1 refills | Status: DC

## 2019-08-08 NOTE — Progress Notes (Signed)
ED f /u   Stated he is ok now does not deal with anxiety at this time

## 2019-08-13 ENCOUNTER — Telehealth: Payer: Self-pay | Admitting: Nurse Practitioner

## 2019-08-13 DIAGNOSIS — F419 Anxiety disorder, unspecified: Secondary | ICD-10-CM

## 2019-08-13 MED ORDER — HYDROXYZINE HCL 25 MG PO TABS: ORAL_TABLET | ORAL | 1 refills | Status: DC

## 2019-08-13 NOTE — Telephone Encounter (Signed)
Pt would like you to transfer this Rx hydrOXYzine (ATARAX/VISTARIL) 25 MG tablet  To  [facility] - [place], [place] - [place] [address] Phone:  [phone]  Fax:  [phone]      Pt does not speak english, and is having a difficult time getting this Rx transferred.  He states he has called last week, spoke to a man, aked this be done, and it was not. Pt went on and on with interpreter about how the clinic does not do their job, and how unhappy he is , and if we do not want to do this kind of work, we should not etc. Can you please have this send to comm pharmacy?

## 2019-08-15 ENCOUNTER — Telehealth: Payer: Self-pay | Admitting: Licensed Clinical Social Worker

## 2019-08-15 NOTE — Telephone Encounter (Signed)
Call placed to patient regarding [facility] referral. LCSW left message requesting a return call.  

## 2019-08-21 NOTE — Telephone Encounter (Signed)
Rx was sent in same day.  Nothing further needed.

## 2019-08-22 ENCOUNTER — Encounter: Payer: Self-pay | Admitting: Nurse Practitioner

## 2019-11-13 ENCOUNTER — Ambulatory Visit: Payer: Self-pay | Attending: Nurse Practitioner | Admitting: Nurse Practitioner

## 2019-11-13 ENCOUNTER — Other Ambulatory Visit: Payer: Self-pay | Admitting: Nurse Practitioner

## 2019-11-13 ENCOUNTER — Other Ambulatory Visit: Payer: Self-pay

## 2019-11-13 ENCOUNTER — Encounter: Payer: Self-pay | Admitting: Nurse Practitioner

## 2019-11-13 VITALS — BP 117/64 | HR 80 | Temp 97.7°F | Ht 66.0 in | Wt 181.0 lb

## 2019-11-13 DIAGNOSIS — R053 Chronic cough: Secondary | ICD-10-CM

## 2019-11-13 DIAGNOSIS — Z0000 Encounter for general adult medical examination without abnormal findings: Secondary | ICD-10-CM

## 2019-11-13 DIAGNOSIS — J3089 Other allergic rhinitis: Secondary | ICD-10-CM

## 2019-11-13 DIAGNOSIS — F419 Anxiety disorder, unspecified: Secondary | ICD-10-CM

## 2019-11-13 DIAGNOSIS — Z1159 Encounter for screening for other viral diseases: Secondary | ICD-10-CM

## 2019-11-13 MED ORDER — BUSPIRONE HCL 15 MG PO TABS: 15.0000 mg | ORAL_TABLET | Freq: Three times a day (TID) | ORAL | 0 refills | Status: DC

## 2019-11-13 MED ORDER — CETIRIZINE HCL 10 MG PO TABS: 10.0000 mg | ORAL_TABLET | Freq: Every day | ORAL | 3 refills | Status: DC

## 2019-11-13 MED ORDER — BENZONATATE 200 MG PO CAPS: 200.0000 mg | ORAL_CAPSULE | Freq: Two times a day (BID) | ORAL | 1 refills | Status: DC | PRN

## 2019-11-13 MED ORDER — FLUTICASONE PROPIONATE 50 MCG/ACT NA SUSP: 2.0000 | Freq: Every day | NASAL | 6 refills | Status: DC

## 2019-11-13 NOTE — Progress Notes (Signed)
Assessment & Plan:  Kellon was seen today for annual exam.  Diagnoses and all orders for this visit:  Encounter for annual physical exam  Need for hepatitis C screening test -     Hepatitis C Antibody  Environmental and seasonal allergies -     cetirizine (ZYRTEC ALLERGY) 10 MG tablet; Take 1 tablet (10 mg total) by mouth daily. -     fluticasone (FLONASE) 50 MCG/ACT nasal spray; Place 2 sprays into both nostrils daily.  Anxiety -     busPIRone (BUSPAR) 15 MG tablet; Take 1 tablet (15 mg total) by mouth 3 (three) times daily.  Chronic cough -     benzonatate (TESSALON) 200 MG capsule; Take 1 capsule (200 mg total) by mouth 2 (two) times daily as needed for cough.    Patient has been counseled on age-appropriate routine health concerns for screening and prevention. These are reviewed and up-to-date. Referrals have been placed accordingly. Immunizations are up-to-date or declined.    Subjective:   Chief Complaint  Patient presents with   Annual Exam    Pt. is here for a physical.    [name] 49 y.o. male presents to office today for annual physical exam.   Chronic Cough He has seasonal allergies, rhinorrhea, bilateral ear pruritis and PND causing intermittent dry cough.    Anxiety Hydroxyzine ineffective. Will try him on buspar.   Review of Systems  Constitutional: Negative for fever, malaise/fatigue and weight loss.  HENT: Negative for nosebleeds.        SEE HPI  Eyes: Negative.  Negative for blurred vision, double vision and photophobia.  Respiratory: Positive for cough. Negative for shortness of breath.   Cardiovascular: Negative.  Negative for chest pain, palpitations and leg swelling.  Gastrointestinal: Negative.  Negative for heartburn, nausea and vomiting.  Genitourinary: Negative.   Musculoskeletal: Negative.  Negative for myalgias.  Skin: Negative.   Neurological: Negative.  Negative for dizziness, focal weakness, seizures and headaches.    Endo/Heme/Allergies: Positive for environmental allergies.  Psychiatric/Behavioral: Negative for suicidal ideas. The patient is nervous/anxious.     Past Medical History:  Diagnosis Date   Asthma    Herpes    Shingles     History reviewed. No pertinent surgical history.  Family History  Problem Relation Age of Onset   Cancer Father     Social History Reviewed with no changes to be made today.   Outpatient Medications Prior to Visit  Medication Sig Dispense Refill   acyclovir (ZOVIRAX) 200 MG capsule Take 1 capsule (200 mg total) by mouth 3 (three) times daily. X 5 days prn 60 capsule 1   cetirizine (ZYRTEC ALLERGY) 10 MG tablet Take 1 tablet (10 mg total) by mouth daily. 90 tablet 3   hydrOXYzine (ATARAX/VISTARIL) 25 MG tablet 1 tablet tid prn anxiety (Patient not taking: Reported on 11/13/2019) 60 tablet 1   ibuprofen (ADVIL) 200 MG tablet Take 200 mg by mouth every 6 (six) hours as needed. (Patient not taking: Reported on 08/08/2019)     naproxen (NAPROSYN) 500 MG tablet Take 1 tablet (500 mg total) by mouth 2 (two) times daily with a meal. (Patient not taking: Reported on 08/08/2019) 30 tablet 0   tiZANidine (ZANAFLEX) 4 MG tablet Take 1 tablet (4 mg total) by mouth every 8 (eight) hours as needed. (Patient not taking: Reported on 11/13/2019) 30 tablet 0   No facility-administered medications prior to visit.    Allergies  Allergen Reactions   Cyclobenzaprine  Facial Numbness/dizziness/hypersomnolence   Methocarbamol     Abdominal pain   Amoxicillin Itching    Pruritic rash   Penicillins Palpitations    Has patient had a PCN reaction causing immediate rash, facial/tongue/throat swelling, SOB or lightheadedness with hypotension: No Has patient had a PCN reaction causing severe rash involving mucus membranes or skin necrosis: No Has patient had a PCN reaction that required hospitalization: No Has patient had a PCN reaction occurring within the last 10  years: No If all of the above answers are "NO", then may proceed with Cephalosporin use.       Objective:    BP 117/64 (BP Location: Left Arm, Patient Position: Sitting, Cuff Size: Normal)    Pulse 80    Temp 97.7 F (36.5 C) (Temporal)    Ht 5\' 6"  (1.676 m)    Wt 181 lb (82.1 kg)    SpO2 99%    BMI 29.21 kg/m  Wt Readings from Last 3 Encounters:  11/13/19 181 lb (82.1 kg)  07/25/19 185 lb 3 oz (84 kg)  07/21/19 184 lb 15.5 oz (83.9 kg)    Physical Exam Vitals and nursing note reviewed.  Constitutional:      Appearance: He is well-developed.  HENT:     Head: Normocephalic and atraumatic.     Right Ear: Hearing, tympanic membrane, ear canal and external ear normal.     Left Ear: Hearing, tympanic membrane, ear canal and external ear normal.     Nose: Nose normal. No mucosal edema or rhinorrhea.     Mouth/Throat:     Pharynx: Uvula midline.     Tonsils: No tonsillar exudate. 1+ on the right. 1+ on the left.  Eyes:     General: Lids are normal. No scleral icterus.    Conjunctiva/sclera: Conjunctivae normal.     Pupils: Pupils are equal, round, and reactive to light.  Neck:     Thyroid: No thyromegaly.     Trachea: No tracheal deviation.  Cardiovascular:     Rate and Rhythm: Normal rate and regular rhythm.     Heart sounds: Normal heart sounds. No murmur heard.  No friction rub. No gallop.   Pulmonary:     Effort: Pulmonary effort is normal. No tachypnea or respiratory distress.     Breath sounds: Normal breath sounds. No decreased breath sounds, wheezing, rhonchi or rales.  Chest:     Chest wall: No mass or tenderness.     Breasts:        Right: No inverted nipple, mass, nipple discharge, skin change or tenderness.        Left: No inverted nipple, mass, nipple discharge, skin change or tenderness.  Abdominal:     General: Bowel sounds are normal. There is no distension.     Palpations: Abdomen is soft. There is no mass.     Tenderness: There is no abdominal tenderness.  There is no guarding or rebound.     Hernia: There is no hernia in the left inguinal area.  Genitourinary:    Penis: Normal.      Testes: Normal.        Right: Mass, tenderness or swelling not present. Right testis is descended. Cremasteric reflex is present.         Left: Mass, tenderness or swelling not present. Left testis is descended. Cremasteric reflex is present.   Musculoskeletal:        General: No tenderness or deformity. Normal range of motion.     Cervical  back: Normal range of motion and neck supple.  Lymphadenopathy:     Cervical: No cervical adenopathy.     Lower Body: No right inguinal adenopathy. No left inguinal adenopathy.  Skin:    General: Skin is warm and dry.     Capillary Refill: Capillary refill takes less than 2 seconds.     Findings: No erythema.  Neurological:     Mental Status: He is alert and oriented to person, place, and time.     Cranial Nerves: Cranial nerves are intact. No cranial nerve deficit.     Sensory: Sensation is intact.     Motor: Motor function is intact. No abnormal muscle tone.     Coordination: Coordination is intact. Coordination normal.     Gait: Gait is intact.     Deep Tendon Reflexes: Reflexes normal.     Reflex Scores:      Patellar reflexes are 2+ on the right side and 2+ on the left side. Psychiatric:        Behavior: Behavior normal. Behavior is cooperative.        Thought Content: Thought content normal.        Judgment: Judgment normal.          Patient has been counseled extensively about nutrition and exercise as well as the importance of adherence with medications and regular follow-up. The patient was given clear instructions to go to ER or return to medical center if symptoms don't improve, worsen or new problems develop. The patient verbalized understanding.   Follow-up: Return if symptoms worsen or fail to improve.   Claiborne Rigg, FNP-BC [facility] [place],  [place] [phone]   11/13/2019, 8:56 PM

## 2019-11-13 NOTE — Patient Instructions (Signed)
Vitamin B12 for energy VItamin D for bone health Turmeric for Arthritis

## 2019-11-19 ENCOUNTER — Other Ambulatory Visit: Payer: Self-pay | Admitting: Nurse Practitioner

## 2019-11-19 ENCOUNTER — Ambulatory Visit: Payer: Self-pay | Attending: Nurse Practitioner

## 2019-11-19 ENCOUNTER — Other Ambulatory Visit: Payer: Self-pay

## 2019-11-19 ENCOUNTER — Telehealth: Payer: Self-pay

## 2019-11-19 DIAGNOSIS — Z1159 Encounter for screening for other viral diseases: Secondary | ICD-10-CM

## 2019-11-19 DIAGNOSIS — E785 Hyperlipidemia, unspecified: Secondary | ICD-10-CM

## 2019-11-19 DIAGNOSIS — Z131 Encounter for screening for diabetes mellitus: Secondary | ICD-10-CM

## 2019-11-19 DIAGNOSIS — Z13228 Encounter for screening for other metabolic disorders: Secondary | ICD-10-CM

## 2019-11-19 MED ORDER — HYDROXYZINE HCL 50 MG PO TABS: 50.0000 mg | ORAL_TABLET | Freq: Three times a day (TID) | ORAL | 0 refills | Status: DC | PRN

## 2019-11-19 MED ORDER — HYDROXYZINE HCL 50 MG PO TABS: 50.0000 mg | ORAL_TABLET | Freq: Three times a day (TID) | ORAL | 1 refills | Status: DC | PRN

## 2019-11-19 NOTE — Telephone Encounter (Signed)
Valium is a benzodiazepine and can be habit forming. I do not prescribe benzos. If he would like we can try the hydroxyzine at a higher dosage?

## 2019-11-19 NOTE — Telephone Encounter (Signed)
Gabriel Ball could you call pt

## 2019-11-19 NOTE — Telephone Encounter (Signed)
I have called the patient to inform him that PCP can prescribe benzos. Patient agreed to try hydroxyzine at a higher dosage.  Please advice [phone]

## 2019-11-19 NOTE — Telephone Encounter (Signed)
Patient came in to clinic for a lab appointment and had question in regards to his medication. Patient states busPIRone (BUSPAR) 15 MG tablet  Causes him to have a major headache and tends to last him a couple of hours.  Patient states diazepam (VALIUM) 5 MG tablet  Was the medication that was prescribed at the hospital and he feels that it really worked. Patient would like to know if PCP can prescribe this medication.   Patient uses [facility] pharmacy   Please advice [phone]

## 2019-11-19 NOTE — Telephone Encounter (Signed)
Will forward to pcp

## 2019-11-19 NOTE — Telephone Encounter (Signed)
Sent to pharmacy 

## 2019-11-20 LAB — BASIC METABOLIC PANEL
BUN/Creatinine Ratio: 13 (ref 9–20)
BUN: 10 mg/dL (ref 6–24)
CO2: 21 mmol/L (ref 20–29)
Calcium: 9.4 mg/dL (ref 8.7–10.2)
Chloride: 106 mmol/L (ref 96–106)
Creatinine, Ser: 0.76 mg/dL (ref 0.76–1.27)
GFR calc Af Amer: 125 mL/min/{1.73_m2} (ref >59)
GFR calc non Af Amer: 108 mL/min/{1.73_m2} (ref >59)
Glucose: 96 mg/dL (ref 65–99)
Potassium: 4.5 mmol/L (ref 3.5–5.2)
Sodium: 141 mmol/L (ref 134–144)

## 2019-11-20 LAB — LIPID PANEL
Chol/HDL Ratio: 4.9 ratio (ref 0.0–5.0)
Cholesterol, Total: 163 mg/dL (ref 100–199)
HDL: 33 mg/dL — ABNORMAL LOW (ref >39)
LDL Chol Calc (NIH): 119 mg/dL — ABNORMAL HIGH (ref 0–99)
Triglycerides: 56 mg/dL (ref 0–149)
VLDL Cholesterol Cal: 11 mg/dL (ref 5–40)

## 2019-11-20 LAB — HEMOGLOBIN A1C
Est. average glucose Bld gHb Est-mCnc: 105 mg/dL
Hgb A1c MFr Bld: 5.3 % (ref 4.8–5.6)

## 2019-11-20 LAB — HCV AB W REFLEX TO QUANT PCR: HCV Ab: 0.1 s/co ratio (ref 0.0–0.9)

## 2019-11-20 LAB — HCV INTERPRETATION

## 2020-02-13 ENCOUNTER — Encounter (HOSPITAL_COMMUNITY): Payer: Self-pay

## 2020-02-13 ENCOUNTER — Emergency Department (HOSPITAL_COMMUNITY): Payer: Self-pay

## 2020-02-13 ENCOUNTER — Emergency Department (HOSPITAL_COMMUNITY)
Admission: EM | Admit: 2020-02-13 | Discharge: 2020-02-13 | Disposition: A | Discharge status: Home / Self Care | Payer: Self-pay | Attending: Emergency Medicine | Admitting: Emergency Medicine

## 2020-02-13 ENCOUNTER — Other Ambulatory Visit: Payer: Self-pay | Admitting: Emergency Medicine

## 2020-02-13 DIAGNOSIS — J189 Pneumonia, unspecified organism: Secondary | ICD-10-CM

## 2020-02-13 DIAGNOSIS — J181 Lobar pneumonia, unspecified organism: Secondary | ICD-10-CM | POA: Insufficient documentation

## 2020-02-13 DIAGNOSIS — Z87891 Personal history of nicotine dependence: Secondary | ICD-10-CM | POA: Insufficient documentation

## 2020-02-13 DIAGNOSIS — R059 Cough, unspecified: Secondary | ICD-10-CM

## 2020-02-13 DIAGNOSIS — J45909 Unspecified asthma, uncomplicated: Secondary | ICD-10-CM | POA: Insufficient documentation

## 2020-02-13 MED ORDER — AZITHROMYCIN 250 MG PO TABS: ORAL_TABLET | ORAL | 0 refills | Status: DC

## 2020-02-13 NOTE — ED Notes (Signed)
Patient taken to xray.

## 2020-02-13 NOTE — ED Notes (Signed)
Patient returned from xray.

## 2020-02-13 NOTE — Discharge Instructions (Signed)
Isolate yourself and mask as directed for the next 5 days. Take antibiotics for 5 days. You may also have Covid as you were exposed to your daughter recently. Take Tylenol every 4 hours for body aches and fevers.  Stay well-hydrated with water.

## 2020-02-13 NOTE — ED Provider Notes (Signed)
/ [facility] EMERGENCY DEPARTMENT Provider Note   CSN: [id number] Arrival date & time: 02/13/20  3244     History Chief Complaint  Patient presents with  . Cough    Gabriel Ball is a 50 y.o. male.  Patient with mild asthma history has used albuterol in the past presents with persistent cough for many months.  No fevers or chills.  Daughter had Covid recently.  No vomiting.        Past Medical History:  Diagnosis Date  . Asthma   . Herpes   . Shingles     Patient Active Problem List   Diagnosis Date Noted  . Asthma due to environmental allergies 12/20/2016  . HSV (herpes simplex virus) infection 02/24/2015  . Anxiety 04/02/2014    History reviewed. No pertinent surgical history.     Family History  Problem Relation Age of Onset  . Cancer Father     Social History   Tobacco Use  . Smoking status: Former Games developer  . Smokeless tobacco: Never Used  Substance Use Topics  . Alcohol use: No  . Drug use: No    Home Medications Prior to Admission medications   Medication Sig Start Date End Date Taking? Authorizing Provider  azithromycin (ZITHROMAX Z-PAK) 250 MG tablet 2 po day one, then 1 daily x 4 days 02/13/20  Yes Blane Ohara, MD  acyclovir (ZOVIRAX) 200 MG capsule Take 1 capsule (200 mg total) by mouth 3 (three) times daily. X 5 days prn 04/25/19   Anders Simmonds, PA-C  benzonatate (TESSALON) 200 MG capsule Take 1 capsule (200 mg total) by mouth 2 (two) times daily as needed for cough. 11/13/19   Claiborne Rigg, NP  cetirizine (ZYRTEC ALLERGY) 10 MG tablet Take 1 tablet (10 mg total) by mouth daily. 11/13/19 02/11/20  Claiborne Rigg, NP  fluticasone (FLONASE) 50 MCG/ACT nasal spray Place 2 sprays into both nostrils daily. 11/13/19   Claiborne Rigg, NP  hydrOXYzine (ATARAX/VISTARIL) 50 MG tablet Take 1 tablet (50 mg total) by mouth 3 (three) times daily as needed for anxiety. 11/19/19   Claiborne Rigg, NP  SUMAtriptan (IMITREX)  50 MG tablet Take one for headache.  May repeat in 2 hours.  No more than 2 a day Patient not taking: Reported on 02/09/2018 05/27/17 07/26/18  Eustace Moore, MD    Allergies    Cyclobenzaprine, Methocarbamol, Amoxicillin, and Penicillins  Review of Systems   Review of Systems  Constitutional: Negative for chills and fever.  HENT: Positive for congestion.   Eyes: Negative for visual disturbance.  Respiratory: Positive for cough. Negative for shortness of breath.   Cardiovascular: Negative for chest pain.  Gastrointestinal: Negative for abdominal pain and vomiting.  Genitourinary: Negative for dysuria and flank pain.  Musculoskeletal: Negative for back pain, neck pain and neck stiffness.  Skin: Negative for rash.  Neurological: Negative for light-headedness and headaches.    Physical Exam Updated Vital Signs BP 107/70   Pulse 69   Temp 98.2 F (36.8 C) (Temporal)   Resp 16   Wt 82 kg   SpO2 97%   BMI 29.18 kg/m   Physical Exam Vitals and nursing note reviewed.  Constitutional:      Appearance: He is well-developed and well-nourished.  HENT:     Head: Normocephalic and atraumatic.  Eyes:     General:        Right eye: No discharge.        Left eye:  No discharge.     Conjunctiva/sclera: Conjunctivae normal.  Neck:     Trachea: No tracheal deviation.  Cardiovascular:     Rate and Rhythm: Normal rate and regular rhythm.  Pulmonary:     Effort: Pulmonary effort is normal.     Breath sounds: Normal breath sounds.  Abdominal:     General: There is no distension.     Palpations: Abdomen is soft.     Tenderness: There is no abdominal tenderness. There is no guarding.  Musculoskeletal:        General: No swelling or edema.     Cervical back: Normal range of motion and neck supple.  Skin:    General: Skin is warm.     Findings: No rash.  Neurological:     Mental Status: He is alert and oriented to person, place, and time.  Psychiatric:        Mood and Affect: Mood  and affect normal.     ED Results / Procedures / Treatments   Labs (all labs ordered are listed, but only abnormal results are displayed) Labs Reviewed - No data to display  EKG None  Radiology DG Chest 2 View  Result Date: 02/13/2020 CLINICAL DATA:  Cough. EXAM: CHEST - 2 VIEW COMPARISON:  08/15/2015. FINDINGS: Mediastinum hilar structures normal. Heart size normal. Low lung volumes. Mild right base infiltrate cannot be excluded. No pleural effusion or pneumothorax. Heart size normal. Degenerative change thoracic spine. IMPRESSION: Low lung volumes. Mild right base infiltrate cannot be excluded. Electronically Signed   By: Maisie Fus  Register   On: 02/13/2020 10:20    Procedures Procedures   Medications Ordered in ED Medications - No data to display  ED Course  I have reviewed the triage vital signs and the nursing notes.  Pertinent labs & imaging results that were available during my care of the patient were reviewed by me and considered in my medical decision making (see chart for details).    MDM Rules/Calculators/A&P                          Patient presents with recurrent cough, broad differential diagnosis including viral/bacterial Covid, heart failure, reflux, other.  Patient has primary doctor follow-up with.  Chest x-ray ordered and reviewed possible small infiltrate.  Plan for azithromycin and outpatient follow-up with primary care. Gabriel Ball was evaluated in Emergency Department on 02/13/2020 for the symptoms described in the history of present illness. He was evaluated in the context of the global COVID-19 pandemic, which necessitated consideration that the patient might be at risk for infection with the SARS-CoV-2 virus that causes COVID-19. Institutional protocols and algorithms that pertain to the evaluation of patients at risk for COVID-19 are in a state of rapid change based on information released by regulatory bodies including the CDC and federal and state  organizations. These policies and algorithms were followed during the patient's care in the ED.   Final Clinical Impression(s) / ED Diagnoses Final diagnoses:  Cough in adult patient  Community acquired pneumonia of right lower lobe of lung    Rx / DC Orders ED Discharge Orders         Ordered    azithromycin (ZITHROMAX Z-PAK) 250 MG tablet        02/13/20 1033           Blane Ohara, MD 02/13/20 1035

## 2020-02-13 NOTE — ED Triage Notes (Signed)
Patient brought daughter in and informed doctor of lingering cough. Has no other symptoms but states cough has been present for a year.

## 2020-02-13 NOTE — ED Notes (Signed)
Patient given sprite to drink. 

## 2020-02-18 ENCOUNTER — Encounter (HOSPITAL_COMMUNITY): Payer: Self-pay

## 2020-02-18 ENCOUNTER — Ambulatory Visit (HOSPITAL_COMMUNITY)
Admission: EM | Admit: 2020-02-18 | Discharge: 2020-02-18 | Disposition: A | Discharge status: Home / Self Care | Payer: HRSA Program | Attending: Urgent Care | Admitting: Urgent Care

## 2020-02-18 ENCOUNTER — Other Ambulatory Visit: Payer: Self-pay | Admitting: Urgent Care

## 2020-02-18 ENCOUNTER — Other Ambulatory Visit: Payer: Self-pay

## 2020-02-18 DIAGNOSIS — J189 Pneumonia, unspecified organism: Secondary | ICD-10-CM | POA: Diagnosis present

## 2020-02-18 DIAGNOSIS — Z20822 Contact with and (suspected) exposure to covid-19: Secondary | ICD-10-CM | POA: Insufficient documentation

## 2020-02-18 DIAGNOSIS — R059 Cough, unspecified: Secondary | ICD-10-CM | POA: Diagnosis not present

## 2020-02-18 LAB — SARS CORONAVIRUS 2 (TAT 6-24 HRS): SARS Coronavirus 2: NEGATIVE

## 2020-02-18 MED ORDER — PROMETHAZINE-DM 6.25-15 MG/5ML PO SYRP: 5.0000 mL | ORAL_SOLUTION | Freq: Every evening | ORAL | 0 refills | Status: DC | PRN

## 2020-02-18 MED ORDER — BENZONATATE 100 MG PO CAPS: 100.0000 mg | ORAL_CAPSULE | Freq: Three times a day (TID) | ORAL | 0 refills | Status: DC | PRN

## 2020-02-18 MED ORDER — PREDNISONE 20 MG PO TABS: ORAL_TABLET | ORAL | 0 refills | Status: DC

## 2020-02-18 NOTE — ED Provider Notes (Signed)
[facility]   MRN: [id number] DOB: Sep 14, 1970  Subjective:   Gabriel Ball is a 50 y.o. male presenting for 2-week history of persistent dry cough.  Patient was diagnosed with pneumonia of the right lower lung base on 02/13/2020.  He completed a course of azithromycin and then also got a different antibiotic, clarithromycin from [place].  He is concerned that he is still coughing.  Would like to make sure he does not have COVID-19.  He is not fully vaccinated.  Has had some right-sided rib pain.  Denies shortness of breath, fever, body aches.  Patient is not a smoker.  States that last year when he had a cough was told that he has asthma but was never previously diagnosed for this.  Denies history of wheezing and intermittent shortness of breath.  He does have a strong history of allergies but does not take anything consistently for this.  No current facility-administered medications for this encounter.  Current Outpatient Medications:  .  acyclovir (ZOVIRAX) 200 MG capsule, Take 1 capsule (200 mg total) by mouth 3 (three) times daily. X 5 days prn, Disp: 60 capsule, Rfl: 1 .  azithromycin (ZITHROMAX Z-PAK) 250 MG tablet, 2 po day one, then 1 daily x 4 days, Disp: 5 tablet, Rfl: 0 .  benzonatate (TESSALON) 200 MG capsule, Take 1 capsule (200 mg total) by mouth 2 (two) times daily as needed for cough., Disp: 30 capsule, Rfl: 1 .  cetirizine (ZYRTEC ALLERGY) 10 MG tablet, Take 1 tablet (10 mg total) by mouth daily., Disp: 90 tablet, Rfl: 3 .  fluticasone (FLONASE) 50 MCG/ACT nasal spray, Place 2 sprays into both nostrils daily., Disp: 16 g, Rfl: 6 .  hydrOXYzine (ATARAX/VISTARIL) 50 MG tablet, Take 1 tablet (50 mg total) by mouth 3 (three) times daily as needed for anxiety., Disp: 60 tablet, Rfl: 1   Allergies  Allergen Reactions  . Cyclobenzaprine     Facial Numbness/dizziness/hypersomnolence  . Methocarbamol     Abdominal pain  . Amoxicillin Itching    Pruritic rash   . Penicillins Palpitations    Has patient had a PCN reaction causing immediate rash, facial/tongue/throat swelling, SOB or lightheadedness with hypotension: No Has patient had a PCN reaction causing severe rash involving mucus membranes or skin necrosis: No Has patient had a PCN reaction that required hospitalization: No Has patient had a PCN reaction occurring within the last 10 years: No If all of the above answers are "NO", then may proceed with Cephalosporin use.    Past Medical History:  Diagnosis Date  . Asthma   . Herpes   . Shingles      No past surgical history on file.  Family History  Problem Relation Age of Onset  . Cancer Father     Social History   Tobacco Use  . Smoking status: Former Games developer  . Smokeless tobacco: Never Used  Substance Use Topics  . Alcohol use: No  . Drug use: No    ROS   Objective:   Vitals: BP 122/74 (BP Location: Left Arm)   Pulse 93   Temp 98.8 F (37.1 C) (Oral)   Resp 17   SpO2 98%   Physical Exam Constitutional:      General: He is not in acute distress.    Appearance: Normal appearance. He is well-developed. He is not ill-appearing, toxic-appearing or diaphoretic.  HENT:     Head: Normocephalic and atraumatic.     Right Ear: External ear normal.  Left Ear: External ear normal.     Nose: Nose normal.     Mouth/Throat:     Mouth: Mucous membranes are moist.     Pharynx: Oropharynx is clear.  Eyes:     General: No scleral icterus.    Extraocular Movements: Extraocular movements intact.     Pupils: Pupils are equal, round, and reactive to light.  Cardiovascular:     Rate and Rhythm: Normal rate and regular rhythm.     Heart sounds: Normal heart sounds. No murmur heard. No friction rub. No gallop.   Pulmonary:     Effort: Pulmonary effort is normal. No respiratory distress.     Breath sounds: Normal breath sounds. No stridor. No wheezing, rhonchi or rales.  Chest:     Chest wall: No tenderness.   Neurological:     Mental Status: He is alert and oriented to person, place, and time.  Psychiatric:        Mood and Affect: Mood normal.        Behavior: Behavior normal.        Thought Content: Thought content normal.      Assessment and Plan :   PDMP not reviewed this encounter.  1. Cough   2. Community acquired pneumonia of right lower lobe of lung   3. Close exposure to COVID-19 virus     Reassured patient about his clear lung sounds, excellent vital signs.  COVID-19 testing is pending.  Ultimately, patient requested aggressive management as he is very worried about still having a cough given his recent diagnosis of pneumonia.  Recommend supportive care, offered a prednisone course.  Repeat chest x-ray in 3 weeks. Counseled patient on potential for adverse effects with medications prescribed/recommended today, ER and return-to-clinic precautions discussed, patient verbalized understanding.    Wallis Bamberg, [place] 02/18/20 [phone]

## 2020-02-18 NOTE — ED Triage Notes (Signed)
Pt presents for follow up: pt was diagnosed with pneumonia 6 days ago and was given antibiotic but is still ongoing cough.

## 2020-02-18 NOTE — ED Triage Notes (Signed)
Pt presents of flank pain when he coughs.

## 2020-02-18 NOTE — Discharge Instructions (Signed)
Prednisone - tome dos pastillas diariamente con desayuno por 5 dias.

## 2020-03-07 ENCOUNTER — Other Ambulatory Visit: Payer: Self-pay | Admitting: Physician Assistant

## 2020-03-07 ENCOUNTER — Emergency Department (HOSPITAL_COMMUNITY)
Admission: EM | Admit: 2020-03-07 | Discharge: 2020-03-07 | Disposition: A | Discharge status: Home / Self Care | Payer: Self-pay | Attending: Emergency Medicine | Admitting: Emergency Medicine

## 2020-03-07 ENCOUNTER — Emergency Department (HOSPITAL_COMMUNITY): Payer: Self-pay

## 2020-03-07 ENCOUNTER — Other Ambulatory Visit: Payer: Self-pay

## 2020-03-07 ENCOUNTER — Encounter (HOSPITAL_COMMUNITY): Payer: Self-pay | Admitting: Emergency Medicine

## 2020-03-07 DIAGNOSIS — X58XXXA Exposure to other specified factors, initial encounter: Secondary | ICD-10-CM | POA: Insufficient documentation

## 2020-03-07 DIAGNOSIS — J45909 Unspecified asthma, uncomplicated: Secondary | ICD-10-CM | POA: Insufficient documentation

## 2020-03-07 DIAGNOSIS — Y990 Civilian activity done for income or pay: Secondary | ICD-10-CM | POA: Insufficient documentation

## 2020-03-07 DIAGNOSIS — R109 Unspecified abdominal pain: Secondary | ICD-10-CM | POA: Insufficient documentation

## 2020-03-07 DIAGNOSIS — Z87891 Personal history of nicotine dependence: Secondary | ICD-10-CM | POA: Insufficient documentation

## 2020-03-07 DIAGNOSIS — Z7951 Long term (current) use of inhaled steroids: Secondary | ICD-10-CM | POA: Insufficient documentation

## 2020-03-07 DIAGNOSIS — S29012A Strain of muscle and tendon of back wall of thorax, initial encounter: Secondary | ICD-10-CM

## 2020-03-07 DIAGNOSIS — S39012A Strain of muscle, fascia and tendon of lower back, initial encounter: Secondary | ICD-10-CM | POA: Insufficient documentation

## 2020-03-07 LAB — URINALYSIS, ROUTINE W REFLEX MICROSCOPIC
Bilirubin Urine: NEGATIVE
Glucose, UA: NEGATIVE mg/dL
Hgb urine dipstick: NEGATIVE
Ketones, ur: NEGATIVE mg/dL
Leukocytes,Ua: NEGATIVE
Nitrite: NEGATIVE
Protein, ur: NEGATIVE mg/dL
Specific Gravity, Urine: 1.013 (ref 1.005–1.030)
pH: 7 (ref 5.0–8.0)

## 2020-03-07 LAB — I-STAT CHEM 8, ED
BUN: 15 mg/dL (ref 6–20)
Calcium, Ion: 1.07 mmol/L — ABNORMAL LOW (ref 1.15–1.40)
Chloride: 105 mmol/L (ref 98–111)
Creatinine, Ser: 0.8 mg/dL (ref 0.61–1.24)
Glucose, Bld: 90 mg/dL (ref 70–99)
HCT: 40 % (ref 39.0–52.0)
Hemoglobin: 13.6 g/dL (ref 13.0–17.0)
Potassium: 5.2 mmol/L — ABNORMAL HIGH (ref 3.5–5.1)
Sodium: 139 mmol/L (ref 135–145)
TCO2: 25 mmol/L (ref 22–32)

## 2020-03-07 MED ORDER — NAPROXEN 500 MG PO TABS: 500.0000 mg | ORAL_TABLET | Freq: Two times a day (BID) | ORAL | 0 refills | Status: DC

## 2020-03-07 MED ORDER — KETOROLAC TROMETHAMINE 30 MG/ML IJ SOLN
30.0000 mg | Freq: Once | INTRAMUSCULAR | Status: AC
  Administered 2020-03-07: 30 mg via INTRAVENOUS
  Filled 2020-03-07: qty 1

## 2020-03-07 NOTE — ED Provider Notes (Signed)
[facility] EMERGENCY DEPARTMENT Provider Note   CSN: [id number] Arrival date & time: 03/07/20  4332     History Chief Complaint  Patient presents with  . Flank Pain    Gabriel Ball is a 50 y.o. male with a past medical history of asthma, herpes currently on acyclovir, recent diagnosis of pneumonia and completed antibiotics within the past month presenting to the ED with a chief complaint of flank pain. States that he was at work last night around 13 hours ago, he works as a Education administrator, when he began having gradual onset of sharp right-sided flank and back pain. He states that symptoms were constant until he drinks several bottles of water which caused some improvement. He denies any nausea, vomiting, changes to bowel movements, dysuria or hematuria. States that he has had similar pain in the past but not as sharp. He denies any injury or trauma at work. No recent heavy lifting. Denies any history of kidney stones that he is aware of. He tried to drink 1 beer in order to help with the pain. He denies any chest pain, shortness of breath, cough or fever, abdominal pain, testicular pain.  History is provided by medical Spanish interpreter. HPI     Past Medical History:  Diagnosis Date  . Asthma   . Herpes   . Shingles     Patient Active Problem List   Diagnosis Date Noted  . Asthma due to environmental allergies 12/20/2016  . HSV (herpes simplex virus) infection 02/24/2015  . Anxiety 04/02/2014    No past surgical history on file.     Family History  Problem Relation Age of Onset  . Cancer Father     Social History   Tobacco Use  . Smoking status: Former Games developer  . Smokeless tobacco: Never Used  Substance Use Topics  . Alcohol use: No  . Drug use: No    Home Medications Prior to Admission medications   Medication Sig Start Date End Date Taking? Authorizing Provider  naproxen (NAPROSYN) 500 MG tablet Take 1 tablet (500 mg total) by mouth 2  (two) times daily. 03/07/20  Yes Amado Andal, PA-C  acyclovir (ZOVIRAX) 200 MG capsule Take 1 capsule (200 mg total) by mouth 3 (three) times daily. X 5 days prn 04/25/19   Anders Simmonds, PA-C  azithromycin (ZITHROMAX Z-PAK) 250 MG tablet 2 po day one, then 1 daily x 4 days 02/13/20   Blane Ohara, MD  benzonatate (TESSALON) 100 MG capsule Take 1-2 capsules (100-200 mg total) by mouth 3 (three) times daily as needed. 02/18/20   Wallis Bamberg, PA-C  cetirizine (ZYRTEC ALLERGY) 10 MG tablet Take 1 tablet (10 mg total) by mouth daily. 11/13/19 02/11/20  Claiborne Rigg, NP  fluticasone (FLONASE) 50 MCG/ACT nasal spray Place 2 sprays into both nostrils daily. 11/13/19   Claiborne Rigg, NP  hydrOXYzine (ATARAX/VISTARIL) 50 MG tablet Take 1 tablet (50 mg total) by mouth 3 (three) times daily as needed for anxiety. 11/19/19   Claiborne Rigg, NP  predniSONE (DELTASONE) 20 MG tablet Take 2 tablets daily with breakfast. 02/18/20   Wallis Bamberg, PA-C  promethazine-dextromethorphan (PROMETHAZINE-DM) 6.25-15 MG/5ML syrup Take 5 mLs by mouth at bedtime as needed for cough. 02/18/20   Wallis Bamberg, PA-C  SUMAtriptan (IMITREX) 50 MG tablet Take one for headache.  May repeat in 2 hours.  No more than 2 a day Patient not taking: Reported on 02/09/2018 05/27/17 07/26/18  Eustace Moore, MD  Allergies    Cyclobenzaprine, Methocarbamol, Amoxicillin, and Penicillins  Review of Systems   Review of Systems  Constitutional: Negative for appetite change, chills and fever.  HENT: Negative for ear pain, rhinorrhea, sneezing and sore throat.   Eyes: Negative for photophobia and visual disturbance.  Respiratory: Negative for cough, chest tightness, shortness of breath and wheezing.   Cardiovascular: Negative for chest pain and palpitations.  Gastrointestinal: Negative for abdominal pain, blood in stool, constipation, diarrhea, nausea and vomiting.  Genitourinary: Positive for flank pain. Negative for dysuria, hematuria  and urgency.  Musculoskeletal: Negative for myalgias.  Skin: Negative for rash.  Neurological: Negative for dizziness, weakness and light-headedness.    Physical Exam Updated Vital Signs BP 111/70   Pulse 65   Temp 98 F (36.7 C) (Oral)   Resp 16   SpO2 100%   Physical Exam Vitals and nursing note reviewed.  Constitutional:      General: He is not in acute distress.    Appearance: He is well-developed and well-nourished.  HENT:     Head: Normocephalic and atraumatic.     Nose: Nose normal.  Eyes:     General: No scleral icterus.       Left eye: No discharge.     Extraocular Movements: EOM normal.     Conjunctiva/sclera: Conjunctivae normal.  Cardiovascular:     Rate and Rhythm: Normal rate and regular rhythm.     Pulses: Intact distal pulses.     Heart sounds: Normal heart sounds. No murmur heard. No friction rub. No gallop.   Pulmonary:     Effort: Pulmonary effort is normal. No respiratory distress.     Breath sounds: Normal breath sounds.  Abdominal:     General: Bowel sounds are normal. There is no distension.     Palpations: Abdomen is soft.     Tenderness: There is no abdominal tenderness. There is no guarding.     Comments: No abnormalities noted on skin.  Musculoskeletal:        General: No edema. Normal range of motion.     Cervical back: Normal range of motion and neck supple.  Skin:    General: Skin is warm and dry.     Findings: No rash.  Neurological:     Mental Status: He is alert.     Motor: No abnormal muscle tone.     Coordination: Coordination normal.  Psychiatric:        Mood and Affect: Mood and affect normal.     ED Results / Procedures / Treatments   Labs (all labs ordered are listed, but only abnormal results are displayed) Labs Reviewed  I-STAT CHEM 8, ED - Abnormal; Notable for the following components:      Result Value   Potassium 5.2 (*)    Calcium, Ion 1.07 (*)    All other components within normal limits  URINE CULTURE   URINALYSIS, ROUTINE W REFLEX MICROSCOPIC    EKG None  Radiology CT Renal Stone Study  Result Date: 03/07/2020 CLINICAL DATA:  Right flank pain. EXAM: CT ABDOMEN AND PELVIS WITHOUT CONTRAST TECHNIQUE: Multidetector CT imaging of the abdomen and pelvis was performed following the standard protocol without IV contrast. COMPARISON:  None. FINDINGS: Lower chest: Unremarkable. Hepatobiliary: No focal abnormality in the liver on this study without intravenous contrast. There is no evidence for gallstones, gallbladder wall thickening, or pericholecystic fluid. No intrahepatic or extrahepatic biliary dilation. Pancreas: No focal mass lesion. No dilatation of the main duct. No intraparenchymal  cyst. No peripancreatic edema. Spleen: No splenomegaly. No focal mass lesion. Adrenals/Urinary Tract: No adrenal nodule or mass. No stones are seen in either kidney or ureter. No secondary changes in either kidney or ureter. No bladder stones. Stomach/Bowel: Tiny hiatal hernia. Stomach otherwise unremarkable. Duodenum is normally positioned as is the ligament of Treitz. No small bowel wall thickening. No small bowel dilatation. The terminal ileum is normal. The appendix is normal. No gross colonic mass. No colonic wall thickening. Vascular/Lymphatic: There is abdominal aortic atherosclerosis without aneurysm. There is no gastrohepatic or hepatoduodenal ligament lymphadenopathy. No retroperitoneal or mesenteric lymphadenopathy. No pelvic sidewall lymphadenopathy. Reproductive: The prostate gland and seminal vesicles are unremarkable. Other: No intraperitoneal free fluid. Musculoskeletal: No worrisome lytic or sclerotic osseous abnormality. IMPRESSION: 1. No acute findings in the abdomen or pelvis. Specifically, no findings to explain the patient's history of right flank pain. No urinary stone disease or secondary changes in the urinary tract. 2. Tiny hiatal hernia. 3. Aortic Atherosclerosis (ICD10-I70.0). Electronically Signed    By: Kennith CenterEric  Mansell M.D.   On: 03/07/2020 12:46    Procedures Procedures   Medications Ordered in ED Medications  ketorolac (TORADOL) 30 MG/ML injection 30 mg (30 mg Intravenous Given 03/07/20 1311)    ED Course  I have reviewed the triage vital signs and the nursing notes.  Pertinent labs & imaging results that were available during my care of the patient were reviewed by me and considered in my medical decision making (see chart for details).  Clinical Course as of 03/07/20 1404  Fri Mar 07, 2020  1100 Hgb urine dipstick: NEGATIVE [HK]  1100 Nitrite: NEGATIVE [HK]  1100 Leukocytes,Ua: NEGATIVE [HK]  1118 Creatinine: 0.80 [HK]    Clinical Course User Index [HK] Dietrich PatesKhatri, Kenna Kirn, PA-C   MDM Rules/Calculators/A&P                          50 year old male presenting to the ED with a chief complaint of right-sided flank pain. 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 He drinks several bottles of water with improvement in his pain yesterday. Denies any changes to bowel movements or urination, nausea, vomiting, fever, chest pain or shortness of breath. Reports similar but not as severe pain in the past. Cannot recall any inciting event that may have triggered this. On exam patient without any tenderness in the abdomen, no CVA tenderness noted bilaterally. He has some pain in the flank area. No overlying skin changes. He is currently being treated with acyclovir for his herpes. Lungs are clear bilaterally. Will obtain urinalysis.  Urinalysis without signs of infection or hematuria.  I-STAT Chem-8 with normal kidney function. CT renal stone study without any abnormal findings in the abdomen pelvis. Suspect symptoms are musculoskeletal in nature.  Based on his reassuring work-up and imaging studies.  Will treat with NSAIDs and heat therapy.  Will advise him to follow-up with PCP and return for worsening symptoms.   Patient is hemodynamically  stable, in NAD, and able to ambulate in the ED. Evaluation does not show pathology that would require ongoing emergent intervention or inpatient treatment. I explained the diagnosis to the patient. Pain has been managed and has no complaints prior to discharge. Patient is comfortable with above plan and is stable for discharge at this time. All questions were answered prior to disposition. Strict return precautions for returning to the ED were discussed. Encouraged  follow up with PCP.   An After Visit Summary was printed and given to the patient.   Portions of this note were generated with Scientist, clinical (histocompatibility and immunogenetics). Dictation errors may occur despite best attempts at proofreading.  Final Clinical Impression(s) / ED Diagnoses Final diagnoses:  Strain of mid-back, initial encounter    Rx / DC Orders ED Discharge Orders         Ordered    naproxen (NAPROSYN) 500 MG tablet  2 times daily        03/07/20 1316           Dietrich Pates, PA-C 03/07/20 1404    Margarita Grizzle, MD 03/07/20 [phone]

## 2020-03-07 NOTE — ED Triage Notes (Signed)
Pt has chief complaint of flank pain that he states started last night, he was seen at [facility] on 02/18/20 for flank pain as well with a cough. He was diagnosed with pneumonia and had neg covid test. He has finished this medication and cough has resolved. Here today for ongoing flank pain

## 2020-03-07 NOTE — ED Notes (Signed)
Pt being transported to CT scan.

## 2020-03-07 NOTE — Discharge Instructions (Addendum)
Take the medications as needed to help with your symptoms. Apply a heating pad to the area to help with pain as well. There was no evidence of any kidney stone or kidney infection on your work-up today. Return to the ER if you start to experience worsening pain, chest pain, shortness of breath.   Tome los medicamentos segn sea necesario para ayudar con sus sntomas. Aplique una almohadilla trmica en el rea para ayudar con el dolor tambin. No hubo evidencia de ningn clculo renal o infeccin renal en su evaluacin de hoy. Regrese a la sala de emergencias si comienza a experimentar un empeoramiento del dolor, dolor en el pecho, dificultad para respirar.

## 2020-03-08 LAB — URINE CULTURE: Culture: NO GROWTH

## 2020-03-27 ENCOUNTER — Ambulatory Visit: Payer: Self-pay | Attending: Nurse Practitioner

## 2020-03-27 ENCOUNTER — Other Ambulatory Visit: Payer: Self-pay

## 2020-06-04 ENCOUNTER — Other Ambulatory Visit: Payer: Self-pay

## 2020-06-04 ENCOUNTER — Ambulatory Visit: Payer: Self-pay | Admitting: Physician Assistant

## 2020-06-04 VITALS — BP 106/87 | HR 84 | Temp 98.2°F | Resp 18 | Ht 65.0 in | Wt 187.0 lb

## 2020-06-04 DIAGNOSIS — M25511 Pain in right shoulder: Secondary | ICD-10-CM

## 2020-06-04 DIAGNOSIS — M545 Low back pain, unspecified: Secondary | ICD-10-CM

## 2020-06-04 MED ORDER — MELOXICAM 7.5 MG PO TABS: 7.5000 mg | ORAL_TABLET | Freq: Every day | ORAL | 0 refills | Status: DC

## 2020-06-04 MED ORDER — METHYLPREDNISOLONE ACETATE 80 MG/ML IJ SUSP
80.0000 mg | Freq: Once | INTRAMUSCULAR | Status: AC
  Administered 2020-06-04: 80 mg via INTRAMUSCULAR

## 2020-06-04 MED ORDER — KETOROLAC TROMETHAMINE 30 MG/ML IJ SOLN
60.0000 mg | Freq: Once | INTRAMUSCULAR | Status: AC
  Administered 2020-06-04: 60 mg via INTRAMUSCULAR

## 2020-06-04 NOTE — Progress Notes (Signed)
Patient reports back pain from february when he strained his back at work. Patient has eaten today. Patient has taken medication today. Patient reports the pain is different from February. Patient states medication helps minimally.

## 2020-06-04 NOTE — Progress Notes (Signed)
Established Patient Office Visit  Subjective:  Patient ID: Gabriel Ball, male    DOB: [birth year]  Age: 50 y.o. MRN: [id number]  CC:  Chief Complaint  Patient presents with  . 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  Denies numbness or tingling, denies radiation down either leg. Hurts worse when after sitting and goes to stand up, feels like "someone hit him in the back" States that it has affected his performance at work, States that he was fired from his job and has not been able to obtain an attorney  Has tried OTC pain medication, chiropractor visits and exercise without relief.  Due to language barrier, an interpreter was present during the history-taking and subsequent discussion (and for part of the physical exam) with this patient.    Past Medical History:  Diagnosis Date  . Asthma   . Herpes   . Shingles     History reviewed. No pertinent surgical history.  Family History  Problem Relation Age of Onset  . Cancer Father     Social History   Socioeconomic History  . Marital status: Single    Spouse name: Not on file  . Number of children: Not on file  . Years of education: Not on file  . Highest education level: Not on file  Occupational History  . Not on file  Tobacco Use  . Smoking status: Former Games developer  . Smokeless tobacco: Never Used  Substance and Sexual Activity  . Alcohol use: No  . Drug use: No  . Sexual activity: Yes  Other Topics Concern  . Not on file  Social History Narrative  . Not on file   Social Determinants of Health   Financial  Resource Strain: Not on file  Food Insecurity: Not on file  Transportation Needs: Not on file  Physical Activity: Not on file  Stress: Not on file  Social Connections: Not on file  Intimate Partner Violence: Not on file    Outpatient Medications Prior to Visit  Medication Sig Dispense Refill  . acyclovir (ZOVIRAX) 200 MG capsule Take 1 capsule (200 mg total) by mouth 3 (three) times daily. X 5 days prn 60 capsule 1  . busPIRone (BUSPAR) 15 MG tablet TAKE 1 TABLET BY MOUTH THREE TIMES DAILY 90 tablet 0  . cetirizine (ZYRTEC ALLERGY) 10 MG tablet Take 1 tablet (10 mg total) by mouth daily. 90 tablet 3  . fluticasone (FLONASE) 50 MCG/ACT nasal spray PLACE 2 SPRAYS INTO BOTH NOSTRILS DAILY 16 g 6  . hydrOXYzine (ATARAX/VISTARIL) 50 MG tablet TAKE 1 TABLET (50 MG TOTAL) BY MOUTH 3 (THREE) TIMES DAILY AS NEEDED FOR ANXIETY. 60 tablet 1  . fluticasone (FLONASE) 50 MCG/ACT nasal spray Place 2 sprays into both nostrils daily. 16 g 6  . naproxen (NAPROSYN) 500 MG tablet TAKE 1 TABLET (500 MG TOTAL) BY MOUTH 2 (TWO) TIMES DAILY. 30 tablet 0  . promethazine-dextromethorphan (PROMETHAZINE-DM) 6.25-15 MG/5ML syrup TAKE 5 MLS BY MOUTH AT BEDTIME AS NEEDED FOR COUGH. 100 mL 0  . azithromycin (ZITHROMAX Z-PAK) 250 MG  tablet 2 po day one, then 1 daily x 4 days 5 tablet 0  . azithromycin (ZITHROMAX) 250 MG tablet TAKE 2 TABLETS BY MOUTH ON DAY 1 THEN TAKE 1 TABLET DAILY FOR THE NEXT 4 DAYS 6 tablet 0  . benzonatate (TESSALON) 100 MG capsule Take 1-2 capsules (100-200 mg total) by mouth 3 (three) times daily as needed. 60 capsule 0  . benzonatate (TESSALON) 100 MG capsule TAKE 1-2 CAPSULES (100-200 MG TOTAL) BY MOUTH 3 (THREE) TIMES DAILY AS NEEDED. 60 capsule 0  . benzonatate (TESSALON) 100 MG capsule TAKE 1 CAPSULE BY MOUTH TWO TIMES DAILY AS NEEDED FOR COUGH 60 capsule 1  . hydrOXYzine (ATARAX/VISTARIL) 50 MG tablet Take 1 tablet (50 mg total) by mouth 3 (three) times daily as needed for anxiety. 60 tablet 1   . naproxen (NAPROSYN) 500 MG tablet Take 1 tablet (500 mg total) by mouth 2 (two) times daily. 30 tablet 0  . predniSONE (DELTASONE) 20 MG tablet Take 2 tablets daily with breakfast. 10 tablet 0  . predniSONE (DELTASONE) 20 MG tablet TAKE 2 TABLETS DAILY WITH BREAKFAST. 10 tablet 0  . promethazine-dextromethorphan (PROMETHAZINE-DM) 6.25-15 MG/5ML syrup Take 5 mLs by mouth at bedtime as needed for cough. 100 mL 0   No facility-administered medications prior to visit.    Allergies  Allergen Reactions  . Cyclobenzaprine     Facial Numbness/dizziness/hypersomnolence  . Methocarbamol     Abdominal pain  . Amoxicillin Itching    Pruritic rash  . Penicillins Palpitations    Has patient had a PCN reaction causing immediate rash, facial/tongue/throat swelling, SOB or lightheadedness with hypotension: No Has patient had a PCN reaction causing severe rash involving mucus membranes or skin necrosis: No Has patient had a PCN reaction that required hospitalization: No Has patient had a PCN reaction occurring within the last 10 years: No If all of the above answers are "NO", then may proceed with Cephalosporin use.    ROS Review of Systems  Constitutional: Negative for chills and fever.  HENT: Negative.   Eyes: Negative.   Respiratory: Negative for shortness of breath.   Cardiovascular: Negative for chest pain.  Gastrointestinal: Negative for abdominal pain, constipation and diarrhea.  Endocrine: Negative.   Genitourinary: Negative for testicular pain.  Musculoskeletal: Positive for arthralgias and back pain.  Skin: Negative.   Allergic/Immunologic: Negative.   Neurological: Negative.   Hematological: Negative.   Psychiatric/Behavioral: Negative.       Objective:    Physical Exam Vitals and nursing note reviewed.  Constitutional:      Appearance: Normal appearance.  HENT:     Head: Normocephalic and atraumatic.     Right Ear: External ear normal.     Left Ear: External ear  normal.     Nose: Nose normal.     Mouth/Throat:     Mouth: Mucous membranes are moist.     Pharynx: Oropharynx is clear.  Eyes:     Extraocular Movements: Extraocular movements intact.     Conjunctiva/sclera: Conjunctivae normal.     Pupils: Pupils are equal, round, and reactive to light.  Cardiovascular:     Rate and Rhythm: Normal rate and regular rhythm.     Pulses: Normal pulses.     Heart sounds: Normal heart sounds.  Pulmonary:     Effort: Pulmonary effort is normal.     Breath sounds: Normal breath sounds.  Musculoskeletal:        General: Normal range of motion.     Right  shoulder: Tenderness present. No swelling. Decreased strength.     Left shoulder: No swelling. Normal range of motion. Normal strength.     Cervical back: Normal, normal range of motion and neck supple.     Thoracic back: Normal range of motion.     Lumbar back: Tenderness present. Normal range of motion.  Skin:    General: Skin is warm and dry.  Neurological:     General: No focal deficit present.     Mental Status: He is alert and oriented to person, place, and time.  Psychiatric:        Mood and Affect: Mood normal.        Behavior: Behavior normal.        Thought Content: Thought content normal.        Judgment: Judgment normal.     BP 106/87 (BP Location: Left Arm, Patient Position: Sitting, Cuff Size: Normal)   Pulse 84   Temp 98.2 F (36.8 C) (Oral)   Resp 18   Ht 5\' 5"  (1.651 m)   Wt 187 lb (84.8 kg)   SpO2 100%   BMI 31.12 kg/m  Wt Readings from Last 3 Encounters:  06/04/20 187 lb (84.8 kg)  02/13/20 180 lb 12.4 oz (82 kg)  11/13/19 181 lb (82.1 kg)     Health Maintenance Due  Topic Date Due  . COVID-19 Vaccine (1) Never done  . COLONOSCOPY (Pts [phone] Insurance coverage will need to be confirmed)  Never done    There are no preventive care reminders to display for this patient.  Lab Results  Component Value Date   TSH 2.560 01/25/2018   Lab Results  Component  Value Date   WBC 8.8 07/25/2019   HGB 13.6 03/07/2020   HCT 40.0 03/07/2020   MCV 85.1 07/25/2019   PLT 231 07/25/2019   Lab Results  Component Value Date   NA 139 03/07/2020   K 5.2 (H) 03/07/2020   CO2 21 11/19/2019   GLUCOSE 90 03/07/2020   BUN 15 03/07/2020   CREATININE 0.80 03/07/2020   BILITOT 0.6 07/25/2019   ALKPHOS 69 07/25/2019   AST 20 07/25/2019   ALT 24 07/25/2019   PROT 7.2 07/25/2019   ALBUMIN 4.4 07/25/2019   CALCIUM 9.4 11/19/2019   ANIONGAP 9 07/25/2019   Lab Results  Component Value Date   CHOL 163 11/19/2019   Lab Results  Component Value Date   HDL 33 (L) 11/19/2019   Lab Results  Component Value Date   LDLCALC 119 (H) 11/19/2019   Lab Results  Component Value Date   TRIG 56 11/19/2019   Lab Results  Component Value Date   CHOLHDL 4.9 11/19/2019   Lab Results  Component Value Date   HGBA1C 5.3 11/19/2019      Assessment & Plan:   Problem List Items Addressed This Visit      Other   Acute bilateral low back pain without sciatica - Primary   Relevant Medications   meloxicam (MOBIC) 7.5 MG tablet   Other Relevant Orders   DG Cervical Spine Complete   Ambulatory referral to Orthopedic Surgery   Acute pain of right shoulder   Relevant Orders   DG Shoulder Right    1. Acute bilateral low back pain without sciatica Trial of Mobic 7.5 mg, patient unable to take muscle relaxers.  Continue rest, icing, gentle stretching, hydration.  Red flags given for prompt reevaluation. - DG Cervical Spine Complete; Future - Ambulatory referral to Orthopedic  Surgery - meloxicam (MOBIC) 7.5 MG tablet; Take 1 tablet (7.5 mg total) by mouth daily.  Dispense: 30 tablet; Refill: 0 - ketorolac (TORADOL) 30 MG/ML injection 60 mg  2. Acute pain of right shoulder  - DG Shoulder Right; Future - methylPREDNISolone acetate (DEPO-MEDROL) injection 80 mg   I have reviewed the patient's medical history (PMH, PSH, Social History, Family History, Medications,  and allergies) , and have been updated if relevant. I spent 32 minutes reviewing chart and  face to face time with patient.     Meds ordered this encounter  Medications  . meloxicam (MOBIC) 7.5 MG tablet    Sig: Take 1 tablet (7.5 mg total) by mouth daily.    Dispense:  30 tablet    Refill:  0    Order Specific Question:   Supervising Provider    Answer:   Delford Field, PATRICK E [[id number]]  . ketorolac (TORADOL) 30 MG/ML injection 60 mg  . methylPREDNISolone acetate (DEPO-MEDROL) injection 80 mg    Follow-up: Return if symptoms worsen or fail to improve.    Kasandra Knudsen Mayers, PA-C

## 2020-06-04 NOTE — Patient Instructions (Signed)
For your back pain and shoulder pain, I encourage you to continue doing stretching, stay very well-hydrated, I sent meloxicam to your pharmacy you can use this once a day to help you with the pain.  I have started a referral for you to be seen for further evaluation by orthopedics.  Once you have completed the x-rays, we will call you with the results.  Please let us know if there is anything else we can do for you.  Roney Jaffe, PA-C Physician Assistant [facility] [email or web address]  Dolor de espalda agudo en los adultos Acute Back Pain, Adult El dolor de espalda agudo es repentino y por lo general no dura mucho tiempo. Se debe generalmente a una lesin de los msculos y tejidos de la espalda. La lesin puede ser el resultado de:  Estiramiento en exceso o desgarro (esguince) de un msculo o ligamento. Los ligamentos son tejidos que [employer]. Levantar algo de forma incorrecta puede producir un esguince de espalda.  Desgaste (degeneracin) de los discos vertebrales. Los discos vertebrales son tejidos circulares que proporcionan amortiguacin entre los huesos de la columna vertebral (vrtebras).  Movimientos de giro, como al practicar deportes o realizar trabajos de [place].  Un golpe en la espalda.  Artritis. Es posible Producer, television/film/video un examen fsico, anlisis de laboratorio u otros estudios de diagnstico por imgenes para Veterinary surgeon causa del Engineer, mining. El dolor de espalda agudo generalmente desaparece con reposo y cuidados en la casa. Siga estas instrucciones en su casa: Control del dolor, la rigidez y la hinchazn  El tratamiento puede incluir medicamentos para Chief Technology Officer y la inflamacin que se toman por boca o que se aplican sobre la piel, analgsicos recetados o relajantes musculares. Use los medicamentos de venta libre y los recetados solamente como se lo haya indicado el mdico.  El mdico puede recomendarle  que se aplique hielo durante las primeras 24a 48horas despus del comienzo del Engineer, mining. Para hacer esto: ? Ponga el hielo en una bolsa plstica. ? Coloque una [employer] piel y Copy. ? Aplique el hielo durante , 2 a 3veces por da.  Si se lo indican, aplique calor en la zona afectada con la frecuencia que le haya indicado el mdico. Use la fuente de calor que el mdico le recomiende, como una compresa de calor hmedo o una almohadilla trmica. ? Coloque una [employer] piel y la fuente de Airline pilot. ? Aplique calor durante 20 a . ? Retire la fuente de calor si la piel se pone de color rojo brillante. Esto es especialmente importante si no puede sentir dolor, calor o fro. Corre un mayor riesgo de sufrir quemaduras. Actividad  No permanezca en la cama. Hacer reposo en la cama por ms de 1 a 2 das puede demorar su recuperacin.  Mantenga una buena postura al sentarse y pararse. No se incline hacia adelante al sentarse ni se encorve al pararse. ? Si trabaja en un escritorio, sintese cerca de este para no tener que inclinarse. Mantenga el mentn hacia abajo. Mantenga el cuello hacia atrs y los codos flexionados en un ngulo de 90 grados (ngulo recto). ? Cuando conduzca, sintese elevado y cerca del volante. Agregue un apoyo para la espalda (lumbar) al asiento del automvil, si es necesario.  Realice caminatas cortas en superficies planas tan pronto como le sea posible. Trate de caminar un poco ms de Pharmacist, community.  No se siente, conduzca o permanezca de pie en un mismo lugar  durante ms de 30 minutos seguidos. Pararse o sentarse durante largos perodos de Contractor la espalda.  No conduzca ni use maquinaria pesada mientras toma analgsicos recetados.  Use tcnicas apropiadas para levantar objetos. Cuando se inclina y Solicitor un [place], utilice posiciones que no sobrecarguen tanto la espalda: ? Flexione las rodillas. ? Mantenga la carga cerca  del cuerpo. ? No se tuerza.  Haga actividad fsica habitualmente como se lo haya indicado el mdico. Hacer ejercicios ayuda a que la espalda sane ms rpido y [place] a Automotive engineer las lesiones de la espalda al [employer] msculos fuertes y flexibles.  Trabaje con un fisioterapeuta para crear un programa de ejercicios seguros, segn lo recomiende el mdico. Haga ejercicios como se lo haya indicado el fisioterapeuta.   Estilo de vida  Mantenga un peso saludable. El sobrepeso sobrecarga la espalda y hace que resulte difcil tener una buena Fate.  Evite actividades o situaciones que lo hagan sentirse ansioso o estresado. El estrs y la ansiedad aumentan la tensin muscular y pueden empeorar el dolor de espalda. Aprenda formas de [employer] ansiedad y [place], como a travs del ejercicio. Instrucciones generales  Duerma sobre un colchn firme en una posicin cmoda. Intente acostarse de costado, con las rodillas ligeramente flexionadas. Si se recuesta [employer], coloque una almohada debajo de las rodillas.  Siga el plan de tratamiento como se lo haya indicado el mdico. Esto puede incluir: ? Terapia cognitiva o conductual. ? Acupuntura o terapia de masajes. ? Yoga o meditacin. Comunquese con un mdico si:  Siente un dolor que no se alivia con reposo o medicamentos.  Siente mucho dolor que se extiende a las piernas o las nalgas.  El dolor no mejora luego de 2 semanas.  Siente dolor por la noche.  Pierde peso sin proponrselo.  Tiene fiebre o escalofros. Solicite ayuda de inmediato si:  Tiene nuevos problemas para controlar la vejiga o los intestinos.  Siente debilidad o adormecimiento inusuales en los brazos o en las piernas.  Siente nuseas o vmitos.  Siente dolor abdominal.  Siente que va a desmayarse. Resumen  El dolor de espalda agudo es repentino y por lo general no dura mucho tiempo.  Use tcnicas apropiadas para levantar objetos. Cuando se inclina y levanta  un [place], utilice posiciones que no sobrecarguen tanto la espalda.  Tome los medicamentos de venta libre o recetados y aplique calor o hielo solamente como se lo haya indicado el mdico. Esta informacin no tiene Theme park manager el consejo del mdico. Asegrese de hacerle al mdico cualquier pregunta que tenga. Document Revised: 10/25/2019 Document Reviewed: 10/25/2019 Elsevier Patient Education  2021 [employer].

## 2020-06-05 DIAGNOSIS — M545 Low back pain, unspecified: Secondary | ICD-10-CM | POA: Insufficient documentation

## 2020-06-05 DIAGNOSIS — M25511 Pain in right shoulder: Secondary | ICD-10-CM | POA: Insufficient documentation

## 2020-06-08 ENCOUNTER — Ambulatory Visit (INDEPENDENT_AMBULATORY_CARE_PROVIDER_SITE_OTHER): Payer: Self-pay

## 2020-06-08 ENCOUNTER — Other Ambulatory Visit: Payer: Self-pay

## 2020-06-08 ENCOUNTER — Ambulatory Visit (HOSPITAL_COMMUNITY)
Admission: EM | Admit: 2020-06-08 | Discharge: 2020-06-08 | Disposition: A | Discharge status: Home / Self Care | Payer: Self-pay | Attending: Physician Assistant | Admitting: Physician Assistant

## 2020-06-08 DIAGNOSIS — G8929 Other chronic pain: Secondary | ICD-10-CM

## 2020-06-08 DIAGNOSIS — M545 Low back pain, unspecified: Secondary | ICD-10-CM

## 2020-06-08 DIAGNOSIS — M542 Cervicalgia: Secondary | ICD-10-CM

## 2020-06-08 DIAGNOSIS — M546 Pain in thoracic spine: Secondary | ICD-10-CM

## 2020-06-08 MED ORDER — NAPROXEN 375 MG PO TABS: 375.0000 mg | ORAL_TABLET | Freq: Two times a day (BID) | ORAL | 0 refills | Status: DC

## 2020-06-08 MED ORDER — TIZANIDINE HCL 4 MG PO CAPS: 4.0000 mg | ORAL_CAPSULE | Freq: Every evening | ORAL | 0 refills | Status: DC | PRN

## 2020-06-08 NOTE — Discharge Instructions (Addendum)
Take Zanaflex at night to help with pain.  This can make you sleepy so do not drive or drink alcohol with it.  I have called in Naprosyn to take twice daily for pain relief.  Do not take additional NSAIDs including aspirin, ibuprofen/Advil, naproxen/Aleve with this medication.  Please follow-up with your PCP as you will likely need physical therapy and/or MRI which cannot be arranged through urgent care.  If you have any worsening symptoms please return for reevaluation.

## 2020-06-08 NOTE — ED Notes (Signed)
Patient does not know the names of medicines he is taking

## 2020-06-08 NOTE — ED Triage Notes (Signed)
Complains of back pain.  On 2/18 injured self.  Patient is not sure what tests were done at ED/or mobile trailer (?)  in regards to this injury.  Pain from upper to lower back and worse in lower back.  Patient reports medication is not helping

## 2020-06-08 NOTE — ED Provider Notes (Signed)
[facility]    CSN: [id number] Arrival date & time: 06/08/20  1634      History   Chief Complaint Chief Complaint  Patient presents with  . Back Pain    [name] is a 50 y.o. male.   Patient is spanish speaking and interpretor was utilized during this visit. Reports a several month history of pain throughout this right back.  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  It is persisted since that time.  He went to the emergency room on 03/07/2020 after this happened and had negative CT stone study and urine findings.  He was seen at a local clinic 06/04/2020 at which point x-rays were ordered but never obtained.  Patient reports pain is severe and rated 8 on a 0-10 pain scale, localized throughout spine and right side of back, described as sharp with periodic throbbing pain, worse with certain movements, no alleviating factors identified.  Patient reports pain is severe and caused him to lose his job.  He has tried numerous over-the-counter medications as well as prescribed medicine without improvement of symptoms.  He has not seen a specialist.  Denies bowel or bladder incontinence, lower extremity weakness, numbness, paresthesias.  He denies any fever, flank pain, urinary symptoms, hematuria.     Past Medical History:  Diagnosis Date  . Asthma   . Herpes   . Shingles     Patient Active Problem List   Diagnosis Date Noted  . Acute bilateral low back pain without sciatica 06/05/2020  . Acute pain of right shoulder 06/05/2020  . Asthma due to environmental allergies 12/20/2016  . HSV (herpes simplex virus) infection 02/24/2015  . Anxiety 04/02/2014    No past surgical history on file.     Home Medications    Prior to Admission medications   Medication Sig Start Date End Date Taking? Authorizing Provider  naproxen (NAPROSYN) 375 MG tablet Take 1 tablet (375  mg total) by mouth 2 (two) times daily. 06/08/20  Yes Samon Dishner K, PA-C  tiZANidine (ZANAFLEX) 4 MG capsule Take 1 capsule (4 mg total) by mouth at bedtime as needed for muscle spasms. 06/08/20  Yes Shinita Mac, Denny Peon K, PA-C  acyclovir (ZOVIRAX) 200 MG capsule Take 1 capsule (200 mg total) by mouth 3 (three) times daily. X 5 days prn 04/25/19   Anders Simmonds, PA-C  busPIRone (BUSPAR) 15 MG tablet TAKE 1 TABLET BY MOUTH THREE TIMES DAILY 11/13/19 11/12/20  Claiborne Rigg, NP  cetirizine (ZYRTEC ALLERGY) 10 MG tablet Take 1 tablet (10 mg total) by mouth daily. 11/13/19 02/11/20  Claiborne Rigg, NP  fluticasone [facility]) 50 MCG/ACT nasal spray PLACE 2 SPRAYS INTO BOTH NOSTRILS DAILY 11/13/19 11/12/20  Claiborne Rigg, NP  hydrOXYzine (ATARAX/VISTARIL) 50 MG tablet TAKE 1 TABLET (50 MG TOTAL) BY MOUTH 3 (THREE) TIMES DAILY AS NEEDED FOR ANXIETY. 11/19/19 11/18/20  Claiborne Rigg, NP  promethazine-dextromethorphan (PROMETHAZINE-DM) 6.25-15 MG/5ML syrup TAKE 5 MLS BY MOUTH AT BEDTIME AS NEEDED FOR COUGH. 02/18/20 02/17/21  Wallis Bamberg, PA-C  SUMAtriptan (IMITREX) 50 MG tablet Take one for headache.  May repeat in 2 hours.  No more than 2 a day Patient not taking: Reported on 02/09/2018 05/27/17 07/26/18  Eustace Moore, MD    Family History Family History  Problem Relation Age of Onset  . Cancer Father     Social History Social History  Tobacco Use  . Smoking status: Former Games developer  . Smokeless tobacco: Never Used  Substance Use Topics  . Alcohol use: No  . Drug use: No     Allergies   Cyclobenzaprine, Methocarbamol, Amoxicillin, and Penicillins   Review of Systems Review of Systems  Constitutional: Positive for activity change. Negative for appetite change, fatigue and fever.  Respiratory: Negative for cough and shortness of breath.   Cardiovascular: Negative for chest pain.  Gastrointestinal: Negative for abdominal pain, diarrhea, nausea and vomiting.  Musculoskeletal: Positive  for back pain, myalgias and neck pain. Negative for arthralgias.  Neurological: Negative for dizziness, weakness, light-headedness, numbness and headaches.     Physical Exam Triage Vital Signs ED Triage Vitals  Enc Vitals Group     BP 06/08/20 1742 113/68     Pulse Rate 06/08/20 1742 81     Resp 06/08/20 1742 18     Temp 06/08/20 1742 99.3 F (37.4 C)     Temp Source 06/08/20 1742 Oral     SpO2 06/08/20 1742 100 %     Weight --      Height --      Head Circumference --      Peak Flow --      Pain Score 06/08/20 1736 8     Pain Loc --      Pain Edu? --      Excl. in GC? --    No data found.  Updated Vital Signs BP 113/68 (BP Location: Right Arm)   Pulse 81   Temp 99.3 F (37.4 C) (Oral)   Resp 18   SpO2 100%   Visual Acuity Right Eye Distance:   Left Eye Distance:   Bilateral Distance:    Right Eye Near:   Left Eye Near:    Bilateral Near:     Physical Exam Vitals reviewed.  Constitutional:      General: He is awake.     Appearance: Normal appearance. He is normal weight. He is not ill-appearing.     Comments: Very pleasant male appears stated age in no acute distress  HENT:     Head: Normocephalic and atraumatic.  Cardiovascular:     Rate and Rhythm: Normal rate and regular rhythm.     Heart sounds: No murmur heard.   Pulmonary:     Effort: Pulmonary effort is normal.     Breath sounds: Normal breath sounds. No stridor. No wheezing, rhonchi or rales.     Comments: Clear to auscultation bilaterally Abdominal:     General: Bowel sounds are normal.     Palpations: Abdomen is soft.     Tenderness: There is no abdominal tenderness.  Musculoskeletal:     Cervical back: Tenderness and bony tenderness present. Pain with movement present.     Thoracic back: Tenderness and bony tenderness present.     Lumbar back: Tenderness and bony tenderness present. Negative right straight leg raise test and negative left straight leg raise test.     Comments: Pain with  percussion throughout cervical/thoracic/lumbar vertebrae.  Tenderness palpation of all paraspinal muscles.  Significant decrease range of motion with rotation and forward flexion.  Neurological:     Mental Status: He is alert.  Psychiatric:        Behavior: Behavior is cooperative.      UC Treatments / Results  Labs (all labs ordered are listed, but only abnormal results are displayed) Labs Reviewed - No data to display  EKG   Radiology DG  Cervical Spine 2-3 Views  Result Date: 06/08/2020 CLINICAL DATA:  Posterior cervical spine pain and tenderness. EXAM: CERVICAL SPINE - 2-3 VIEW COMPARISON:  04/03/2017 FINDINGS: There is no evidence of cervical spine fracture or prevertebral soft tissue swelling. Alignment is normal. No other significant bone abnormalities are identified. IMPRESSION: Negative cervical spine radiographs. Electronically Signed   By: Danae Orleans M.D.   On: 06/08/2020 19:23   DG Thoracic Spine 2 View  Result Date: 06/08/2020 CLINICAL DATA:  Posterior thoracic spine pain and tenderness. EXAM: THORACIC SPINE 2 VIEWS COMPARISON:  None. FINDINGS: There is no evidence of thoracic spine fracture. Alignment is normal. No other significant bone abnormalities are identified. IMPRESSION: Negative. Electronically Signed   By: Danae Orleans M.D.   On: 06/08/2020 19:24   DG Lumbar Spine 2-3 Views  Result Date: 06/08/2020 CLINICAL DATA:  Posterior low back pain tenderness. EXAM: LUMBAR SPINE - 2-3 VIEW COMPARISON:  08/29/2011 FINDINGS: There is no evidence of lumbar spine fracture. Alignment is normal. Intervertebral disc spaces are maintained. No other osseous abnormality identified. IMPRESSION: Negative lumbar spine radiographs. Electronically Signed   By: Danae Orleans M.D.   On: 06/08/2020 19:25    Procedures Procedures (including critical care time)  Medications Ordered in UC Medications - No data to display  Initial Impression / Assessment and Plan / UC Course  I have  reviewed the triage vital signs and the nursing notes.  Pertinent labs & imaging results that were available during my care of the patient were reviewed by me and considered in my medical decision making (see chart for details).     X-rays obtained given pain percussion of vertebrae of cervical/thoracic/lumbar spine showed no acute abnormalities.  Patient was prescribed Zanaflex with instruction not to drive or drink alcohol this medication as drowsiness is a common side effect.  He was prescribed Naprosyn to be used twice daily for pain relief.  Discussed he is not to take NSAIDs with this medication due to risk of GI bleeding.  Given prolonged and worsening symptoms discussed potential need for MRI and/or physical therapy but this would need to be arranged through PCP.  Recommended patient follow-up with PCP within 1 week.  Strict return precautions given to which patient expressed understanding.  Final Clinical Impressions(s) / UC Diagnoses   Final diagnoses:  Neck pain  Chronic right-sided thoracic back pain  Lumbar back pain     Discharge Instructions     Take Zanaflex at night to help with pain.  This can make you sleepy so do not drive or drink alcohol with it.  I have called in Naprosyn to take twice daily for pain relief.  Do not take additional NSAIDs including aspirin, ibuprofen/Advil, naproxen/Aleve with this medication.  Please follow-up with your PCP as you will likely need physical therapy and/or MRI which cannot be arranged through urgent care.  If you have any worsening symptoms please return for reevaluation.    ED Prescriptions    Medication Sig Dispense Auth. Provider   tiZANidine (ZANAFLEX) 4 MG capsule Take 1 capsule (4 mg total) by mouth at bedtime as needed for muscle spasms. 10 capsule Nilda Keathley K, PA-C   naproxen (NAPROSYN) 375 MG tablet Take 1 tablet (375 mg total) by mouth 2 (two) times daily. 20 tablet Yaacov Koziol, Noberto Retort, PA-C     PDMP not reviewed this  encounter.   Jeani Hawking, PA-C 06/08/20 1933

## 2020-06-30 ENCOUNTER — Other Ambulatory Visit: Payer: Self-pay

## 2020-06-30 ENCOUNTER — Ambulatory Visit (HOSPITAL_COMMUNITY)
Admission: EM | Admit: 2020-06-30 | Discharge: 2020-06-30 | Disposition: A | Discharge status: Home / Self Care | Payer: Self-pay | Attending: Internal Medicine | Admitting: Internal Medicine

## 2020-06-30 ENCOUNTER — Encounter (HOSPITAL_COMMUNITY): Payer: Self-pay

## 2020-06-30 DIAGNOSIS — Z0189 Encounter for other specified special examinations: Secondary | ICD-10-CM

## 2020-06-30 DIAGNOSIS — Z760 Encounter for issue of repeat prescription: Secondary | ICD-10-CM

## 2020-06-30 DIAGNOSIS — Z20822 Contact with and (suspected) exposure to covid-19: Secondary | ICD-10-CM

## 2020-06-30 MED ORDER — HYDROCORTISONE (PERIANAL) 2.5 % EX CREA
1.0000 | TOPICAL_CREAM | Freq: Two times a day (BID) | CUTANEOUS | 2 refills | Status: DC
Start: 2020-06-30 — End: 2020-11-11

## 2020-06-30 MED ORDER — BENZONATATE 100 MG PO CAPS: 100.0000 mg | ORAL_CAPSULE | Freq: Three times a day (TID) | ORAL | 0 refills | Status: DC

## 2020-06-30 MED ORDER — FLUTICASONE PROPIONATE 50 MCG/ACT NA SUSP: 1.0000 | Freq: Two times a day (BID) | NASAL | 0 refills | Status: DC

## 2020-06-30 NOTE — ED Triage Notes (Signed)
Pt requested COVID test.   Pt requested cream for hemorrhoids.

## 2020-06-30 NOTE — ED Provider Notes (Signed)
[facility]  ____________________________________________  Time seen: Approximately 6:07 PM  I have reviewed the triage vital signs and the nursing notes.   HISTORY  Chief Complaint Medication Refill    [name] is a 50 y.o. male who presented to the urgent care for initially a COVID test.  Patient was initially a nurse visit but then "wanted to be checked out" and wanted medication refill.  Patient states that he has had some URI symptoms subjective fever, cough, nasal congestion for several days.  His work is requiring that he have a negative COVID test to return back to work at this time.  Patient denies any headache, visual changes, neck pain or stiffness, shortness of breath or chest pain.  Patient also requested medication refill for hemorrhoids.  Patient denies any abdominal pain or active rectal bleeding at this time.       Past Medical History:  Diagnosis Date   Asthma    Herpes    Shingles     Patient Active Problem List   Diagnosis Date Noted   Acute bilateral low back pain without sciatica 06/05/2020   Acute pain of right shoulder 06/05/2020   Asthma due to environmental allergies 12/20/2016   HSV (herpes simplex virus) infection 02/24/2015   Anxiety 04/02/2014    History reviewed. No pertinent surgical history.  Prior to Admission medications   Medication Sig Start Date End Date Taking? Authorizing Provider  benzonatate (TESSALON) 100 MG capsule Take 1 capsule (100 mg total) by mouth every 8 (eight) hours. 06/30/20  Yes Amron Guerrette, Delorise Royals, PA-C  fluticasone (FLONASE) 50 MCG/ACT nasal spray Place 1 spray into both nostrils 2 (two) times daily. 06/30/20  Yes Darryn Kydd, Delorise Royals, PA-C  hydrocortisone (ANUSOL-HC) 2.5 % rectal cream Place 1 application rectally 2 (two) times daily. 06/30/20  Yes Issai Werling, Delorise Royals, PA-C  acyclovir (ZOVIRAX) 200 MG capsule Take 1 capsule (200 mg total) by mouth 3 (three) times daily. X 5 days prn  04/25/19   Anders Simmonds, PA-C  busPIRone (BUSPAR) 15 MG tablet TAKE 1 TABLET BY MOUTH THREE TIMES DAILY 11/13/19 11/12/20  Claiborne Rigg, NP  cetirizine (ZYRTEC ALLERGY) 10 MG tablet Take 1 tablet (10 mg total) by mouth daily. 11/13/19 02/11/20  Claiborne Rigg, NP  fluticasone [facility]) 50 MCG/ACT nasal spray PLACE 2 SPRAYS INTO BOTH NOSTRILS DAILY 11/13/19 11/12/20  Claiborne Rigg, NP  hydrOXYzine (ATARAX/VISTARIL) 50 MG tablet TAKE 1 TABLET (50 MG TOTAL) BY MOUTH 3 (THREE) TIMES DAILY AS NEEDED FOR ANXIETY. 11/19/19 11/18/20  Claiborne Rigg, NP  naproxen (NAPROSYN) 375 MG tablet Take 1 tablet (375 mg total) by mouth 2 (two) times daily. 06/08/20   Raspet, Noberto Retort, PA-C  promethazine-dextromethorphan (PROMETHAZINE-DM) 6.25-15 MG/5ML syrup TAKE 5 MLS BY MOUTH AT BEDTIME AS NEEDED FOR COUGH. 02/18/20 02/17/21  Wallis Bamberg, PA-C  tiZANidine (ZANAFLEX) 4 MG capsule Take 1 capsule (4 mg total) by mouth at bedtime as needed for muscle spasms. 06/08/20   Raspet, Noberto Retort, PA-C  SUMAtriptan (IMITREX) 50 MG tablet Take one for headache.  May repeat in 2 hours.  No more than 2 a day Patient not taking: Reported on 02/09/2018 05/27/17 07/26/18  Eustace Moore, MD    Allergies Cyclobenzaprine, Methocarbamol, Amoxicillin, and Penicillins  Family History  Problem Relation Age of Onset   Cancer Father     Social History Social History   Tobacco Use   Smoking status: Former    Pack years: 0.00  Smokeless tobacco: Never  Substance Use Topics   Alcohol use: No   Drug use: No     Review of Systems  Constitutional: Subjective fever/chills Eyes: No visual changes. No discharge ENT: Positive for nasal congestion Cardiovascular: no chest pain. Respiratory: Positive cough. No SOB. Gastrointestinal: No abdominal pain.  No nausea, no vomiting.  No diarrhea.  No constipation. Musculoskeletal: Negative for musculoskeletal pain. Skin: Negative for rash, abrasions, lacerations,  ecchymosis. Neurological: Negative for headaches, focal weakness or numbness.  10 System ROS otherwise negative.  ____________________________________________   PHYSICAL EXAM:  VITAL SIGNS: ED Triage Vitals [06/30/20 1759]  Enc Vitals Group     BP      Pulse Rate 67     Resp 18     Temp 98 F (36.7 C)     Temp Source Oral     SpO2 98 %     Weight      Height      Head Circumference      Peak Flow      Pain Score 0     Pain Loc      Pain Edu?      Excl. in GC?      Constitutional: Alert and oriented. Well appearing and in no acute distress. Eyes: Conjunctivae are normal. PERRL. EOMI. Head: Atraumatic. ENT:      Ears: EACs and TMs unremarkable bilaterally.      Nose: No congestion/rhinnorhea.      Mouth/Throat: Mucous membranes are moist.  Neck: No stridor.  Neck is supple full range of motion Hematological/Lymphatic/Immunilogical: No cervical lymphadenopathy. Cardiovascular: Normal rate, regular rhythm. Normal S1 and S2.  Good peripheral circulation. Respiratory: Normal respiratory effort without tachypnea or retractions. Lungs CTAB. Good air entry to the bases with no decreased or absent breath sounds. Gastrointestinal: Bowel sounds 4 quadrants. Soft and nontender to palpation. No guarding or rigidity. No palpable masses. No distention. No CVA tenderness. Musculoskeletal: Full range of motion to all extremities. No gross deformities appreciated. Neurologic:  Normal speech and language. No gross focal neurologic deficits are appreciated.  Skin:  Skin is warm, dry and intact. No rash noted. Psychiatric: Mood and affect are normal. Speech and behavior are normal. Patient exhibits appropriate insight and judgement.   ____________________________________________   LABS (all labs ordered are listed, but only abnormal results are displayed)  Labs Reviewed  SARS CORONAVIRUS 2 (TAT 6-24 HRS)    ____________________________________________  EKG   ____________________________________________  RADIOLOGY   No results found.  ____________________________________________    PROCEDURES  Procedure(s) performed:    Procedures    Medications - No data to display   ____________________________________________   INITIAL IMPRESSION / ASSESSMENT AND PLAN / ED COURSE  Pertinent labs & imaging results that were available during my care of the patient were reviewed by me and considered in my medical decision making (see chart for details).  Review of the [place] CSRS was performed in accordance of the [facility] prior to dispensing any controlled drugs.           Patient's diagnosis is consistent with encounter for COVID testing and medication refill.  Patient presented to the urgent care requesting COVID testing and then went to see a provider to be "checked out."  Exam was reassuring.  Patient wanted refill of medicine for hemorrhoids but denies any abdominal pain or rectal bleeding.  Patient is stable for discharge at this time.  COVID test is still pending and patient will follow results in MyChart.Marland Kitchen  Follow-up primary care.. Patient is given ED precautions to return to the ED for any worsening or new symptoms.     ____________________________________________  FINAL CLINICAL IMPRESSION(S) / DIAGNOSES  Final diagnoses:  Medication refill  Encounter for laboratory testing for COVID-19 virus      NEW MEDICATIONS STARTED DURING THIS VISIT:  ED Discharge Orders          Ordered    hydrocortisone (ANUSOL-HC) 2.5 % rectal cream  2 times daily        06/30/20 1844    benzonatate (TESSALON) 100 MG capsule  Every 8 hours        06/30/20 1844    fluticasone (FLONASE) 50 MCG/ACT nasal spray  2 times daily        06/30/20 1844                This chart was dictated using voice recognition software/Dragon. Despite best efforts to proofread, errors can occur  which can change the meaning. Any change was purely unintentional.    Racheal Patches, PA-C 06/30/20 1856

## 2020-07-01 LAB — SARS CORONAVIRUS 2 (TAT 6-24 HRS): SARS Coronavirus 2: NEGATIVE

## 2020-07-17 ENCOUNTER — Ambulatory Visit: Payer: Self-pay | Attending: Nurse Practitioner

## 2020-07-17 ENCOUNTER — Other Ambulatory Visit: Payer: Self-pay

## 2020-07-21 ENCOUNTER — Encounter (HOSPITAL_COMMUNITY): Payer: Self-pay | Admitting: *Deleted

## 2020-07-21 ENCOUNTER — Other Ambulatory Visit: Payer: Self-pay

## 2020-07-21 ENCOUNTER — Ambulatory Visit (HOSPITAL_COMMUNITY)
Admission: EM | Admit: 2020-07-21 | Discharge: 2020-07-21 | Disposition: A | Discharge status: Home / Self Care | Payer: Self-pay | Attending: Urgent Care | Admitting: Urgent Care

## 2020-07-21 DIAGNOSIS — N481 Balanitis: Secondary | ICD-10-CM

## 2020-07-21 DIAGNOSIS — B009 Herpesviral infection, unspecified: Secondary | ICD-10-CM

## 2020-07-21 MED ORDER — NYSTATIN-TRIAMCINOLONE 100000-0.1 UNIT/GM-% EX OINT: 1.0000 "application " | TOPICAL_OINTMENT | Freq: Two times a day (BID) | CUTANEOUS | 0 refills | Status: DC

## 2020-07-21 MED ORDER — NYSTATIN-TRIAMCINOLONE 100000-0.1 UNIT/GM-% EX OINT
1.0000 "application " | TOPICAL_OINTMENT | Freq: Two times a day (BID) | CUTANEOUS | 0 refills | Status: DC
  Filled 2020-07-21: qty 30, 15d supply, fill #0

## 2020-07-21 MED ORDER — VALACYCLOVIR HCL 1 G PO TABS: ORAL_TABLET | ORAL | 5 refills | Status: DC

## 2020-07-21 MED ORDER — VALACYCLOVIR HCL 1 G PO TABS
ORAL_TABLET | ORAL | 5 refills | Status: DC
  Filled 2020-07-21: qty 30, fill #0

## 2020-07-21 NOTE — ED Provider Notes (Signed)
[facility]   MRN: [id number] DOB: September 01, 1970  Subjective:   Gabriel Ball is a 50 y.o. male presenting for 3-day history of recurrent stinging pain about the shaft of his penis on either side.  Patient has unprotected sex with 1 male partner.  She is diabetic and has trouble with yeast infections.  States that he used her medicine but did not help.  He also has a history of herpes simplex virus but does not have the medication now.  Denies fever, nausea, vomiting, pelvic pain, dysuria, penile discharge, vesicular lesions.  States that he does not feel like he is developing a herpes outbreak but would like medication for this just in case.  No current facility-administered medications for this encounter.  Current Outpatient Medications:    acyclovir (ZOVIRAX) 200 MG capsule, Take 1 capsule (200 mg total) by mouth 3 (three) times daily. X 5 days prn, Disp: 60 capsule, Rfl: 1   benzonatate (TESSALON) 100 MG capsule, Take 1 capsule (100 mg total) by mouth every 8 (eight) hours., Disp: 21 capsule, Rfl: 0   busPIRone (BUSPAR) 15 MG tablet, TAKE 1 TABLET BY MOUTH THREE TIMES DAILY, Disp: 90 tablet, Rfl: 0   cetirizine (ZYRTEC ALLERGY) 10 MG tablet, Take 1 tablet (10 mg total) by mouth daily., Disp: 90 tablet, Rfl: 3   fluticasone (FLONASE) 50 MCG/ACT nasal spray, Place 1 spray into both nostrils 2 (two) times daily., Disp: 16 g, Rfl: 0   fluticasone (FLONASE) 50 MCG/ACT nasal spray, PLACE 2 SPRAYS INTO BOTH NOSTRILS DAILY, Disp: 16 g, Rfl: 6   hydrocortisone (ANUSOL-HC) 2.5 % rectal cream, Place 1 application rectally 2 (two) times daily., Disp: 30 g, Rfl: 2   hydrOXYzine (ATARAX/VISTARIL) 50 MG tablet, TAKE 1 TABLET (50 MG TOTAL) BY MOUTH 3 (THREE) TIMES DAILY AS NEEDED FOR ANXIETY., Disp: 60 tablet, Rfl: 1   naproxen (NAPROSYN) 375 MG tablet, Take 1 tablet (375 mg total) by mouth 2 (two) times daily., Disp: 20 tablet, Rfl: 0   promethazine-dextromethorphan  (PROMETHAZINE-DM) 6.25-15 MG/5ML syrup, TAKE 5 MLS BY MOUTH AT BEDTIME AS NEEDED FOR COUGH., Disp: 100 mL, Rfl: 0   tiZANidine (ZANAFLEX) 4 MG capsule, Take 1 capsule (4 mg total) by mouth at bedtime as needed for muscle spasms., Disp: 10 capsule, Rfl: 0   Allergies  Allergen Reactions   Cyclobenzaprine     Facial Numbness/dizziness/hypersomnolence   Methocarbamol     Abdominal pain   Amoxicillin Itching    Pruritic rash   Penicillins Palpitations    Has patient had a PCN reaction causing immediate rash, facial/tongue/throat swelling, SOB or lightheadedness with hypotension: No Has patient had a PCN reaction causing severe rash involving mucus membranes or skin necrosis: No Has patient had a PCN reaction that required hospitalization: No Has patient had a PCN reaction occurring within the last 10 years: No If all of the above answers are "NO", then may proceed with Cephalosporin use.    Past Medical History:  Diagnosis Date   Asthma    Herpes    Shingles      History reviewed. No pertinent surgical history.  Family History  Problem Relation Age of Onset   Cancer Father     Social History   Tobacco Use   Smoking status: Former    Pack years: 0.00   Smokeless tobacco: Never  Substance Use Topics   Alcohol use: No   Drug use: No    ROS   Objective:   Vitals:  BP 110/60   Pulse 79   Temp 98.7 F (37.1 C)   Resp 18   SpO2 98%   Physical Exam Constitutional:      General: He is not in acute distress.    Appearance: Normal appearance. He is well-developed and normal weight. He is not ill-appearing, toxic-appearing or diaphoretic.  HENT:     Head: Normocephalic and atraumatic.     Right Ear: External ear normal.     Left Ear: External ear normal.     Nose: Nose normal.     Mouth/Throat:     Pharynx: Oropharynx is clear.  Eyes:     General: No scleral icterus.       Right eye: No discharge.        Left eye: No discharge.     Extraocular Movements:  Extraocular movements intact.     Pupils: Pupils are equal, round, and reactive to light.  Cardiovascular:     Rate and Rhythm: Normal rate.  Pulmonary:     Effort: Pulmonary effort is normal.  Genitourinary:   Musculoskeletal:     Cervical back: Normal range of motion.  Neurological:     Mental Status: He is alert and oriented to person, place, and time.  Psychiatric:        Mood and Affect: Mood normal.        Behavior: Behavior normal.        Thought Content: Thought content normal.        Judgment: Judgment normal.      Assessment and Plan :   PDMP not reviewed this encounter.  1. Balanitis   2. Herpes simplex infection     Will cover for balanitis with Mycolog.  Refilled Valtrex. Counseled patient on potential for adverse effects with medications prescribed/recommended today, ER and return-to-clinic precautions discussed, patient verbalized understanding.    Wallis Bamberg, [place] 07/21/20 [phone]

## 2020-07-21 NOTE — ED Triage Notes (Signed)
Pt unclear about his [employer] interpreter used

## 2020-07-22 ENCOUNTER — Other Ambulatory Visit: Payer: Self-pay

## 2020-07-22 MED ORDER — NYSTATIN-TRIAMCINOLONE 100000-0.1 UNIT/GM-% EX OINT
TOPICAL_OINTMENT | CUTANEOUS | 0 refills | Status: DC
  Filled 2020-07-22: qty 30, 15d supply, fill #0

## 2020-07-23 ENCOUNTER — Other Ambulatory Visit: Payer: Self-pay

## 2020-08-11 ENCOUNTER — Encounter (HOSPITAL_COMMUNITY): Payer: Self-pay | Admitting: *Deleted

## 2020-08-11 ENCOUNTER — Ambulatory Visit (HOSPITAL_COMMUNITY)
Admission: EM | Admit: 2020-08-11 | Discharge: 2020-08-11 | Disposition: A | Discharge status: Home / Self Care | Payer: Self-pay | Attending: Student | Admitting: Student

## 2020-08-11 ENCOUNTER — Other Ambulatory Visit: Payer: Self-pay

## 2020-08-11 DIAGNOSIS — Z789 Other specified health status: Secondary | ICD-10-CM

## 2020-08-11 DIAGNOSIS — S29019A Strain of muscle and tendon of unspecified wall of thorax, initial encounter: Secondary | ICD-10-CM

## 2020-08-11 MED ORDER — TIZANIDINE HCL 2 MG PO CAPS: 2.0000 mg | ORAL_CAPSULE | Freq: Three times a day (TID) | ORAL | 0 refills | Status: DC
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
## 2020-08-11 NOTE — Discharge Instructions (Addendum)
-  Start the muscle relaxer-Zanaflex (tizanidine), up to 3 times daily for muscle spasms and pain.  This can make you drowsy, so take at bedtime or when you do not need to drive or operate machinery. -Take Tylenol 1000 mg 3 times daily, and ibuprofen 800 mg 3 times daily with food.  You can take these together, or alternate every 3-4 hours. -Rest, heating pad

## 2020-08-11 NOTE — ED Triage Notes (Signed)
Pt only wants paper rx not e -fax

## 2020-08-24 ENCOUNTER — Ambulatory Visit (HOSPITAL_COMMUNITY)
Admission: EM | Admit: 2020-08-24 | Discharge: 2020-08-24 | Disposition: A | Discharge status: Home / Self Care | Payer: Self-pay | Attending: Physician Assistant | Admitting: Physician Assistant

## 2020-08-24 ENCOUNTER — Encounter (HOSPITAL_COMMUNITY): Payer: Self-pay

## 2020-08-24 ENCOUNTER — Other Ambulatory Visit: Payer: Self-pay

## 2020-08-24 DIAGNOSIS — F411 Generalized anxiety disorder: Secondary | ICD-10-CM

## 2020-08-24 MED ORDER — DIAZEPAM 5 MG PO TABS: 5.0000 mg | ORAL_TABLET | Freq: Two times a day (BID) | ORAL | 0 refills | Status: DC | PRN

## 2020-08-24 NOTE — Discharge Instructions (Addendum)
Follow up with Behavioral health  
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
## 2020-08-24 NOTE — ED Triage Notes (Signed)
Pt states on Friday he was anxious was prescribes hydroxyzine and states that it is not working for him.

## 2020-09-02 ENCOUNTER — Other Ambulatory Visit: Payer: Self-pay

## 2020-09-02 MED ORDER — HYDROCORTISONE (PERIANAL) 2.5 % EX CREA
TOPICAL_CREAM | CUTANEOUS | 2 refills | Status: DC
  Filled 2020-09-02: qty 30, 15d supply, fill #0

## 2020-09-02 MED ORDER — TIZANIDINE HCL 2 MG PO TABS
ORAL_TABLET | ORAL | 0 refills | Status: DC
  Filled 2020-09-02: qty 14, 4d supply, fill #0

## 2020-10-15 ENCOUNTER — Encounter (HOSPITAL_COMMUNITY): Payer: Self-pay | Admitting: Emergency Medicine

## 2020-10-15 ENCOUNTER — Ambulatory Visit (HOSPITAL_COMMUNITY)
Admission: EM | Admit: 2020-10-15 | Discharge: 2020-10-15 | Disposition: A | Discharge status: Home / Self Care | Payer: Self-pay | Attending: Family Medicine | Admitting: Family Medicine

## 2020-10-15 ENCOUNTER — Other Ambulatory Visit: Payer: Self-pay

## 2020-10-15 DIAGNOSIS — M545 Low back pain, unspecified: Secondary | ICD-10-CM

## 2020-10-15 DIAGNOSIS — M25511 Pain in right shoulder: Secondary | ICD-10-CM

## 2020-10-15 DIAGNOSIS — G8929 Other chronic pain: Secondary | ICD-10-CM

## 2020-10-15 MED ORDER — MELOXICAM 15 MG PO TABS
15.0000 mg | ORAL_TABLET | Freq: Every day | ORAL | 0 refills | Status: DC
  Filled 2020-10-15: qty 30, 30d supply, fill #0

## 2020-10-15 NOTE — ED Triage Notes (Signed)
Pt c/o of Right side Back x 9 months. He has not been able to work the past 5 days due to the pain. He wants blood work performed to make sure his kidneys are ok.

## 2020-10-15 NOTE — ED Provider Notes (Signed)
[facility]    CSN: [id number] Arrival date & time: 10/15/20  3220      History   Chief Complaint Chief Complaint  Patient presents with   Back Pain    [name] is a 50 y.o. male.   Right sided low back pain Started many months ago Worsens with activity and heavy lifting Works as a Corporate investment banker Denies any radiation to his legs, numbness and tingling, saddle anesthesias, changes in bowel or bladder habits, fevers States that he has been seen for this before and has been told that he did not have a kidney stone, however is very worried that his kidneys are the source of his pain today He is hoping that he can get blood work today to check on his kidneys and cholesterol Reports that he has been urinating normally, denies hematuria  Right shoulder pain Also reports right shoulder pain for many months that worsens with overhead activity Reports lots of heavy lifting with his job Pain sometimes radiates into his hand, however he denies numbness and tingling He denies any neck pain Denies any injuries Pain is deep within his anterior shoulder   Past Medical History:  Diagnosis Date   Asthma    Herpes    Shingles     Patient Active Problem List   Diagnosis Date Noted   Acute bilateral low back pain without sciatica 06/05/2020   Acute pain of right shoulder 06/05/2020   Asthma due to environmental allergies 12/20/2016   HSV (herpes simplex virus) infection 02/24/2015   Anxiety 04/02/2014    History reviewed. No pertinent surgical history.     Home Medications    Prior to Admission medications   Medication Sig Start Date End Date Taking? Authorizing Provider  Meloxicam 15 MG TBDP Take 15 mg by mouth daily. 10/15/20  Yes Romonia Yanik, Solmon Ice, DO  acyclovir (ZOVIRAX) 200 MG capsule Take 1 capsule (200 mg total) by mouth 3 (three) times daily. X 5 days prn 04/25/19   Anders Simmonds, PA-C  cetirizine (ZYRTEC ALLERGY) 10 MG tablet  Take 1 tablet (10 mg total) by mouth daily. 11/13/19 02/11/20  Claiborne Rigg, NP  diazepam (VALIUM) 5 MG tablet Take 1 tablet (5 mg total) by mouth every 12 (twelve) hours as needed for anxiety. 08/24/20 08/24/21  Elson Areas, PA-C  fluticasone (FLONASE) 50 MCG/ACT nasal spray Place 1 spray into both nostrils 2 (two) times daily. 06/30/20   Cuthriell, Delorise Royals, PA-C  fluticasone (FLONASE) 50 MCG/ACT nasal spray PLACE 2 SPRAYS INTO BOTH NOSTRILS DAILY 11/13/19 11/12/20  Claiborne Rigg, NP  hydrocortisone (ANUSOL-HC) 2.5 % rectal cream Place 1 application rectally 2 (two) times daily. 06/30/20   Cuthriell, Delorise Royals, PA-C  hydrocortisone (ANUSOL-HC) 2.5 % rectal cream place 1 application rectally twice daily 07/30/20   Cuthriell, Delorise Royals, PA-C  hydrOXYzine (ATARAX/VISTARIL) 50 MG tablet TAKE 1 TABLET (50 MG TOTAL) BY MOUTH 3 (THREE) TIMES DAILY AS NEEDED FOR ANXIETY. 11/19/19 11/18/20  Claiborne Rigg, NP  nystatin-triamcinolone ointment [facility]) Apply 1 application topically 2 (two) times daily. 07/21/20   Wallis Bamberg, PA-C  nystatin-triamcinolone ointment (MYCOLOG) Apply topically. 07/21/20   Wallis Bamberg, PA-C  promethazine-dextromethorphan (PROMETHAZINE-DM) 6.25-15 MG/5ML syrup TAKE 5 MLS BY MOUTH AT BEDTIME AS NEEDED FOR COUGH. 02/18/20 02/17/21  Wallis Bamberg, PA-C  tizanidine (ZANAFLEX) 2 MG capsule Take 1 capsule (2 mg total) by mouth 3 (three) times daily. 08/11/20   Rhys Martini, PA-C  tiZANidine (ZANAFLEX)  2 MG tablet take 1 tablet by mouth three times daily 08/11/20   Rhys Martini, PA-C  valACYclovir (VALTREX) 1000 MG tablet Take 1 tablet for 5 days at the start of an outbreak. 07/21/20   Wallis Bamberg, PA-C  SUMAtriptan (IMITREX) 50 MG tablet Take one for headache.  May repeat in 2 hours.  No more than 2 a day Patient not taking: Reported on 02/09/2018 05/27/17 07/26/18  Eustace Moore, MD    Family History Family History  Problem Relation Age of Onset   Cancer Father     Social  History Social History   Tobacco Use   Smoking status: Former   Smokeless tobacco: Never  Building services engineer Use: Never used  Substance Use Topics   Alcohol use: No   Drug use: No     Allergies   Cyclobenzaprine, Methocarbamol, Amoxicillin, and Penicillins   Review of Systems Review of Systems  All other systems reviewed and are negative.  Per HPI Physical Exam Triage Vital Signs ED Triage Vitals  Enc Vitals Group     BP      Pulse      Resp      Temp      Temp src      SpO2      Weight      Height      Head Circumference      Peak Flow      Pain Score      Pain Loc      Pain Edu?      Excl. in GC?    No data found.  Updated Vital Signs BP 108/72 (BP Location: Left Arm)   Pulse 67   Temp 98.4 F (36.9 C) (Oral)   SpO2 100%   Visual Acuity Right Eye Distance:   Left Eye Distance:   Bilateral Distance:    Right Eye Near:   Left Eye Near:    Bilateral Near:     Physical Exam Constitutional:      Appearance: Normal appearance.  HENT:     Head: Normocephalic and atraumatic.  Musculoskeletal:     Comments: Lumbar spine: - Inspection: no gross deformity or asymmetry, swelling or ecchymosis - Palpation: Mild TTP of right lumbar paraspinal musculature, No TTP over the spinous processes, or SI joints b/l - ROM: full active ROM of the lumbar spine in flexion and extension with pain at extreme of flexion  - Strength: 5/5 strength of lower extremity in L4-S1 nerve root distributions b/l; normal gait - Neuro: sensation intact in the L4-S1 nerve root distribution b/l, 2+ L4 and S1 reflexes - Special testing: Negative straight leg raise  Right Shoulder: Inspection reveals no obvious deformity, atrophy, or asymmetry. No bruising. No swelling Palpation is normal with no TTP over [facility] joint or bicipital groove. Full passive ROM in flexion, abduction, internal/external rotation with pain at extreme of all fields NV intact distally Special Tests:  -  Impingement: Positive Hawkins - Supraspinatous: Equivocal empty can - Infraspinatous/Teres Minor: 5/5 strength with ER - Subscapularis: 5/5 strength with IR - Labrum: good stability        Neurological:     General: No focal deficit present.     Mental Status: He is alert and oriented to person, place, and time.     Cranial Nerves: No cranial nerve deficit.     Sensory: No sensory deficit.     Coordination: Coordination normal.     Gait: Gait normal.  Deep Tendon Reflexes: Reflexes normal.     UC Treatments / Results  Labs (all labs ordered are listed, but only abnormal results are displayed) Labs Reviewed - No data to display  EKG   Radiology No results found.  Procedures Procedures (including critical care time)  Medications Ordered in UC Medications - No data to display  Initial Impression / Assessment and Plan / UC Course  I have reviewed the triage vital signs and the nursing notes.  Pertinent labs & imaging results that were available during my care of the patient were reviewed by me and considered in my medical decision making (see chart for details).     Patient is a 50 year old male who presents with chronic right low back and right shoulder pain.  He initially presented because he wanted a blood work done today including cholesterol and to check his kidney function, but after further discussion that his chronic back pain is likely not related to his kidneys and lipids could not be performed today, he plans to follow-up with his primary care provider for these labs.  He is urinating well and pain worsens with activity, very likely muscular.  He does not have any radicular symptoms.  Right shoulder pain also likely secondary to subacromial bursitis and overall overuse with rotator cuff tendinitis.  Will prescribe meloxicam for both issues, advised to avoid other NSAIDs with this.  He was given ED precautions and also advised that he could use topical  medications as needed.  Advise follow-up with his primary care provider if he is not having improvement. Final Clinical Impressions(s) / UC Diagnoses   Final diagnoses:  Chronic right-sided low back pain without sciatica  Chronic right shoulder pain     Discharge Instructions      You have muscular back pain and right shoulder pain that is likely from overuse.  You can use meloxicam daily for your pain.  You can also use topical medications such as capsaicin cream, icy hot, Bengay, Salonpas patches if you find these helpful.  Follow-up with your primary care provider for your lab work and if this medication is not improving your pain.  Do not take meloxicam with any anti-inflammatories including ibuprofen, Advil, Aleve, naproxen.  If you experience difficulty urinating, use stool on yourself, you have numbness and tingling in your groin, you have weakness in your legs, you should be seen at the emergency room right away.     ED Prescriptions     Medication Sig Dispense Auth. Provider   Meloxicam 15 MG TBDP Take 15 mg by mouth daily. 30 tablet Clarissia Mckeen, Solmon Ice, DO      PDMP not reviewed this encounter.   Katina Remick, Solmon Ice, DO 10/15/20 [phone]

## 2020-10-15 NOTE — Discharge Instructions (Signed)
You have muscular back pain and right shoulder pain that is likely from overuse.  You can use meloxicam daily for your pain.  You can also use topical medications such as capsaicin cream, icy hot, Bengay, Salonpas patches if you find these helpful.  Follow-up with your primary care provider for your lab work and if this medication is not improving your pain.  Do not take meloxicam with any anti-inflammatories including ibuprofen, Advil, Aleve, naproxen.  If you experience difficulty urinating, use stool on yourself, you have numbness and tingling in your groin, you have weakness in your legs, you should be seen at the emergency room right away.
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
## 2020-10-16 ENCOUNTER — Other Ambulatory Visit: Payer: Self-pay

## 2020-10-16 ENCOUNTER — Ambulatory Visit: Payer: Self-pay | Attending: Nurse Practitioner | Admitting: Physician Assistant

## 2020-10-16 ENCOUNTER — Encounter: Payer: Self-pay | Admitting: Physician Assistant

## 2020-10-16 VITALS — BP 109/71 | HR 78 | Ht 65.0 in | Wt 185.0 lb

## 2020-10-16 DIAGNOSIS — L089 Local infection of the skin and subcutaneous tissue, unspecified: Secondary | ICD-10-CM

## 2020-10-16 DIAGNOSIS — M545 Low back pain, unspecified: Secondary | ICD-10-CM

## 2020-10-16 DIAGNOSIS — Z789 Other specified health status: Secondary | ICD-10-CM

## 2020-10-16 DIAGNOSIS — G8929 Other chronic pain: Secondary | ICD-10-CM

## 2020-10-16 DIAGNOSIS — Z09 Encounter for follow-up examination after completed treatment for conditions other than malignant neoplasm: Secondary | ICD-10-CM

## 2020-10-16 DIAGNOSIS — E7800 Pure hypercholesterolemia, unspecified: Secondary | ICD-10-CM

## 2020-10-16 MED ORDER — DICLOFENAC SODIUM 1 % EX GEL
2.0000 g | Freq: Four times a day (QID) | CUTANEOUS | 1 refills | Status: DC
  Filled 2020-10-16: qty 100, 12d supply, fill #0

## 2020-10-16 MED ORDER — MUPIROCIN 2 % EX OINT
1.0000 "application " | TOPICAL_OINTMENT | Freq: Two times a day (BID) | CUTANEOUS | 0 refills | Status: DC
  Filled 2020-10-16: qty 22, 11d supply, fill #0

## 2020-10-16 NOTE — Progress Notes (Signed)
Gabriel Ball [id number] Hand and back pain

## 2020-10-17 LAB — COMPREHENSIVE METABOLIC PANEL
ALT: 20 IU/L (ref 0–44)
AST: 21 IU/L (ref 0–40)
Albumin/Globulin Ratio: 1.9 (ref 1.2–2.2)
Albumin: 4.6 g/dL (ref 4.0–5.0)
Alkaline Phosphatase: 87 IU/L (ref 44–121)
BUN/Creatinine Ratio: 11 (ref 9–20)
BUN: 8 mg/dL (ref 6–24)
Bilirubin Total: 0.4 mg/dL (ref 0.0–1.2)
CO2: 23 mmol/L (ref 20–29)
Calcium: 9.4 mg/dL (ref 8.7–10.2)
Chloride: 105 mmol/L (ref 96–106)
Creatinine, Ser: 0.74 mg/dL — ABNORMAL LOW (ref 0.76–1.27)
Globulin, Total: 2.4 g/dL (ref 1.5–4.5)
Glucose: 88 mg/dL (ref 70–99)
Potassium: 4.2 mmol/L (ref 3.5–5.2)
Sodium: 142 mmol/L (ref 134–144)
Total Protein: 7 g/dL (ref 6.0–8.5)
eGFR: 111 mL/min/{1.73_m2} (ref >59)

## 2020-10-17 LAB — LIPID PANEL
Chol/HDL Ratio: 6.5 ratio — ABNORMAL HIGH (ref 0.0–5.0)
Cholesterol, Total: 169 mg/dL (ref 100–199)
HDL: 26 mg/dL — ABNORMAL LOW (ref >39)
LDL Chol Calc (NIH): 107 mg/dL — ABNORMAL HIGH (ref 0–99)
Triglycerides: 203 mg/dL — ABNORMAL HIGH (ref 0–149)
VLDL Cholesterol Cal: 36 mg/dL (ref 5–40)

## 2020-10-23 ENCOUNTER — Telehealth: Payer: Self-pay

## 2020-10-23 NOTE — Telephone Encounter (Signed)
Copied from CRM [id number]. Topic: General - Other >> Oct 13, 2020  8:43 AM Gwenlyn Fudge wrote: Reason for CRM: Pt called stating that he is needing to have lab orders placed so that he can have his kidneys checked due to the back pain he is in. Please advise.

## 2020-10-24 ENCOUNTER — Ambulatory Visit: Payer: Self-pay | Admitting: Nurse Practitioner

## 2020-10-24 NOTE — Telephone Encounter (Signed)
Pt called back but no one answered the line at the office.  [phone]

## 2020-10-24 NOTE — Telephone Encounter (Signed)
Left a Vm to call the office back.

## 2020-10-24 NOTE — Telephone Encounter (Signed)
Will forward to nurse 

## 2020-10-29 ENCOUNTER — Telehealth: Payer: Self-pay | Admitting: *Deleted

## 2020-10-29 NOTE — Telephone Encounter (Signed)
Copied from CRM [id number]. Topic: General - Other >> Oct 24, 2020  3:57 PM Glean Salen wrote: Reason for CRM: Patient called in for lab results. Please call back when they are in

## 2020-11-11 ENCOUNTER — Encounter (HOSPITAL_COMMUNITY): Payer: Self-pay | Admitting: Emergency Medicine

## 2020-11-11 ENCOUNTER — Ambulatory Visit (HOSPITAL_COMMUNITY)
Admission: EM | Admit: 2020-11-11 | Discharge: 2020-11-11 | Disposition: A | Discharge status: Home / Self Care | Payer: Self-pay | Attending: Student | Admitting: Student

## 2020-11-11 ENCOUNTER — Other Ambulatory Visit: Payer: Self-pay

## 2020-11-11 DIAGNOSIS — E7800 Pure hypercholesterolemia, unspecified: Secondary | ICD-10-CM

## 2020-11-11 DIAGNOSIS — J301 Allergic rhinitis due to pollen: Secondary | ICD-10-CM

## 2020-11-11 DIAGNOSIS — Z789 Other specified health status: Secondary | ICD-10-CM

## 2020-11-11 DIAGNOSIS — K5901 Slow transit constipation: Secondary | ICD-10-CM

## 2020-11-11 DIAGNOSIS — K640 First degree hemorrhoids: Secondary | ICD-10-CM

## 2020-11-11 DIAGNOSIS — F411 Generalized anxiety disorder: Secondary | ICD-10-CM

## 2020-11-11 MED ORDER — HYDROCORTISONE (PERIANAL) 2.5 % EX CREA: TOPICAL_CREAM | CUTANEOUS | 2 refills | Status: DC

## 2020-11-11 MED ORDER — HYDROCORTISONE 2.5 % EX CREA
TOPICAL_CREAM | CUTANEOUS | 2 refills | Status: DC
  Filled 2020-11-11: qty 30, 15d supply, fill #0

## 2020-11-11 MED ORDER — DOCUSATE SODIUM 100 MG PO CAPS: 100.0000 mg | ORAL_CAPSULE | Freq: Two times a day (BID) | ORAL | 0 refills | Status: DC

## 2020-11-11 NOTE — Discharge Instructions (Signed)
-  Take the Omega 1000mg  daily  -Follow-up with PCP with additional chronic medical conditions -Follow-up with surgery if you wish to discuss further management of hermorrhoid.

## 2020-11-11 NOTE — ED Triage Notes (Signed)
Pt presents with hemorrhoids, rectal pain and bleeding.

## 2020-11-11 NOTE — ED Provider Notes (Signed)
[facility]    CSN: [id number] Arrival date & time: 11/11/20  1038      History   Chief Complaint Chief Complaint  Patient presents with   Hemorrhoids   Rectal Bleeding    [name] is a 50 y.o. male presenting with multiple complaints: hemorrhoids/rectal bleeding; congestion and cough; high cholesterol; health anxiety. Medical history hemorrhoids. Spoke with this patient using language line. -3 days of rectal pain and bleeding consistent with past hemorrhoids. Requesting the cream he got last time. Endorses mild constipation and straining to pass BM. Concerned that he needs surgery for this. -Congestion and cough since turning on the [facility] 1 day ago. Hasn't tried medications for this.  -high cholesterol at recent PCP visit, he is asking me how much Omega fatty acids to take   HPI  Past Medical History:  Diagnosis Date   Asthma    Herpes    Shingles     Patient Active Problem List   Diagnosis Date Noted   Acute bilateral low back pain without sciatica 06/05/2020   Acute pain of right shoulder 06/05/2020   Asthma due to environmental allergies 12/20/2016   HSV (herpes simplex virus) infection 02/24/2015   Anxiety 04/02/2014    History reviewed. No pertinent surgical history.     Home Medications    Prior to Admission medications   Medication Sig Start Date End Date Taking? Authorizing Provider  docusate sodium (COLACE) 100 MG capsule Take 1 capsule (100 mg total) by mouth every 12 (twelve) hours. 1 pill daily to induce regular bowel movement, as needed. Can increase to 2 pills daily. 11/11/20  Yes Rhys Martini, PA-C  cetirizine (ZYRTEC ALLERGY) 10 MG tablet Take 1 tablet (10 mg total) by mouth daily. 11/13/19 02/11/20  Claiborne Rigg, NP  diclofenac Sodium (VOLTAREN) 1 % GEL Apply 2 g topically 4 (four) times daily. 10/16/20   Anders Simmonds, PA-C  hydrocortisone (ANUSOL-HC) 2.5 % rectal cream place 1 application rectally twice daily  11/11/20   Rhys Martini, PA-C  hydrOXYzine (ATARAX/VISTARIL) 50 MG tablet TAKE 1 TABLET (50 MG TOTAL) BY MOUTH 3 (THREE) TIMES DAILY AS NEEDED FOR ANXIETY. Patient not taking: Reported on 10/16/2020 11/19/19 11/18/20  Claiborne Rigg, NP  meloxicam (MOBIC) 15 MG tablet Take 1 tablet (15 mg total) by mouth daily. 10/15/20   Meccariello, Solmon Ice, DO  mupirocin ointment (BACTROBAN) 2 % Apply 1 application topically 2 (two) times daily. 10/16/20   Anders Simmonds, PA-C  nystatin-triamcinolone ointment (MYCOLOG) Apply 1 application topically 2 (two) times daily. Patient not taking: Reported on 10/16/2020 07/21/20   Wallis Bamberg, PA-C  tizanidine (ZANAFLEX) 2 MG capsule Take 1 capsule (2 mg total) by mouth 3 (three) times daily. Patient not taking: Reported on 10/16/2020 08/11/20   Rhys Martini, PA-C  valACYclovir (VALTREX) 1000 MG tablet Take 1 tablet for 5 days at the start of an outbreak. Patient not taking: Reported on 10/16/2020 07/21/20   Wallis Bamberg, PA-C  SUMAtriptan (IMITREX) 50 MG tablet Take one for headache.  May repeat in 2 hours.  No more than 2 a day Patient not taking: Reported on 02/09/2018 05/27/17 07/26/18  Eustace Moore, MD    Family History Family History  Problem Relation Age of Onset   Cancer Father     Social History Social History   Tobacco Use   Smoking status: Former   Smokeless tobacco: Never  Vaping Use   Vaping Use: Never used  Substance Use Topics   Alcohol use: No   Drug use: No     Allergies   Cyclobenzaprine, Methocarbamol, Amoxicillin, and Penicillins   Review of Systems Review of Systems  Constitutional:  Negative for appetite change, chills and fever.  HENT:  Positive for congestion. Negative for ear pain, rhinorrhea, sinus pressure, sinus pain and sore throat.   Eyes:  Negative for redness and visual disturbance.  Respiratory:  Positive for cough. Negative for chest tightness, shortness of breath and wheezing.   Cardiovascular:  Negative for  chest pain and palpitations.  Gastrointestinal:  Positive for anal bleeding. Negative for abdominal pain, constipation, diarrhea, nausea and vomiting.  Genitourinary:  Negative for dysuria, frequency and urgency.  Musculoskeletal:  Negative for myalgias.  Neurological:  Negative for dizziness, weakness and headaches.  Psychiatric/Behavioral:  Negative for confusion.   All other systems reviewed and are negative.   Physical Exam Triage Vital Signs ED Triage Vitals  Enc Vitals Group     BP 11/11/20 1155 (!) 109/42     Pulse Rate 11/11/20 1155 80     Resp 11/11/20 1155 16     Temp 11/11/20 1155 99.2 F (37.3 C)     Temp Source 11/11/20 1155 Oral     SpO2 11/11/20 1155 100 %     Weight --      Height --      Head Circumference --      Peak Flow --      Pain Score 11/11/20 1152 5     Pain Loc --      Pain Edu? --      Excl. in [place]? --    No data found.  Updated Vital Signs BP (!) 109/42 (BP Location: Left Arm)   Pulse 80   Temp 99.2 F (37.3 C) (Oral)   Resp 16   SpO2 100%   Visual Acuity Right Eye Distance:   Left Eye Distance:   Bilateral Distance:    Right Eye Near:   Left Eye Near:    Bilateral Near:     Physical Exam Vitals reviewed.  Constitutional:      General: He is not in acute distress.    Appearance: Normal appearance. He is not ill-appearing.  HENT:     Head: Normocephalic and atraumatic.     Nose: Congestion present.     Mouth/Throat:     Mouth: Mucous membranes are moist.     Comments: Moist mucous membranes Eyes:     Extraocular Movements: Extraocular movements intact.     Pupils: Pupils are equal, round, and reactive to light.  Cardiovascular:     Rate and Rhythm: Normal rate and regular rhythm.     Heart sounds: Normal heart sounds.  Pulmonary:     Effort: Pulmonary effort is normal.     Breath sounds: Normal breath sounds. No wheezing, rhonchi or rales.  Abdominal:     General: Bowel sounds are normal. There is no distension.      Palpations: Abdomen is soft. There is no mass.     Tenderness: There is no abdominal tenderness. There is no right CVA tenderness, left CVA tenderness, guarding or rebound.  Genitourinary:    Comments: Chaperone: interpreter [phone] Ome small 55mm hemorrhoid at 6 o'clock, tender, no active bleeding. Internal exam not performed. Skin:    General: Skin is warm.     Capillary Refill: Capillary refill takes less than 2 seconds.     Comments: Good skin turgor  Neurological:  General: No focal deficit present.     Mental Status: He is alert and oriented to person, place, and time.  Psychiatric:        Mood and Affect: Mood normal.        Behavior: Behavior normal.     UC Treatments / Results  Labs (all labs ordered are listed, but only abnormal results are displayed) Labs Reviewed - No data to display  EKG   Radiology No results found.  Procedures Procedures (including critical care time)  Medications Ordered in UC Medications - No data to display  Initial Impression / Assessment and Plan / UC Course  I have reviewed the triage vital signs and the nursing notes.  Pertinent labs & imaging results that were available during my care of the patient were reviewed by me and considered in my medical decision making (see chart for details).     This patient is a very pleasant 50 y.o. year old male presenting with hemorrhoids; allergies; high cholesterol; health anxiety.   For hemorrhoids- preparation H cream sent. Colace for constipation.  For allergies- zyrtec OTC daily  For high cholesterol- continue OTC omega supplement, f/u with PCP  ED return precautions discussed. Patient verbalizes understanding and agreement.   Coding Level 4 for review of past notes/labs (from [facility]), order and interpretation of labs today, and prescription drug management  Spoke with this patient using language line.    Final Clinical Impressions(s) / UC Diagnoses    Final diagnoses:  Grade I hemorrhoids  Seasonal allergic rhinitis due to pollen  High cholesterol  Language barrier  Anxiety state  Slow transit constipation     Discharge Instructions      -Take the Omega 1000mg  daily  -Follow-up with PCP with additional chronic medical conditions -Follow-up with surgery if you wish to discuss further management of hermorrhoid.     ED Prescriptions     Medication Sig Dispense Auth. Provider   hydrocortisone (ANUSOL-HC) 2.5 % rectal cream place 1 application rectally twice daily 30 g , PA-C   docusate sodium (COLACE) 100 MG capsule Take 1 capsule (100 mg total) by mouth every 12 (twelve) hours. 1 pill daily to induce regular bowel movement, as needed. Can increase to 2 pills daily. 60 capsule Rhys Martini, PA-C      PDMP not reviewed this encounter.   Rhys Martini, PA-C 11/11/20 1329

## 2020-11-18 ENCOUNTER — Other Ambulatory Visit: Payer: Self-pay

## 2020-11-18 ENCOUNTER — Ambulatory Visit (HOSPITAL_COMMUNITY)
Admission: EM | Admit: 2020-11-18 | Discharge: 2020-11-18 | Disposition: A | Discharge status: Home / Self Care | Payer: Self-pay | Attending: Emergency Medicine | Admitting: Emergency Medicine

## 2020-11-18 ENCOUNTER — Encounter (HOSPITAL_COMMUNITY): Payer: Self-pay | Admitting: Emergency Medicine

## 2020-11-18 DIAGNOSIS — J09X2 Influenza due to identified novel influenza A virus with other respiratory manifestations: Secondary | ICD-10-CM

## 2020-11-18 MED ORDER — OSELTAMIVIR PHOSPHATE 75 MG PO CAPS: 75.0000 mg | ORAL_CAPSULE | Freq: Two times a day (BID) | ORAL | 0 refills | Status: DC

## 2020-11-18 NOTE — ED Triage Notes (Signed)
Pt is present today with fever, cough, HA. Pt states that sx started Friday

## 2020-11-21 NOTE — ED Provider Notes (Signed)
[facility]    CSN: [id number] Arrival date & time: 11/18/20  1010      History   Chief Complaint Chief Complaint  Patient presents with   Cough   Headache   Fever    [name] is a 50 y.o. male.  Patient is here to be seen in urgent care along with his 74-year-old daughter.  His 75-year-old daughter was diagnosed with influenza A in the emergency room on 11/14/2020.  He began having similar symptoms that same day.  He reports fever, cough, headache, sore throat, nasal congestion.  This visit was conducted with the help of video Spanish interpreter   Cough Associated symptoms: fever, headaches and sore throat   Associated symptoms: no shortness of breath   Headache Associated symptoms: congestion, cough, fever and sore throat   Fever Associated symptoms: congestion, cough, headaches and sore throat    Past Medical History:  Diagnosis Date   Asthma    Herpes    Shingles     Patient Active Problem List   Diagnosis Date Noted   Acute bilateral low back pain without sciatica 06/05/2020   Acute pain of right shoulder 06/05/2020   Asthma due to environmental allergies 12/20/2016   HSV (herpes simplex virus) infection 02/24/2015   Anxiety 04/02/2014    History reviewed. No pertinent surgical history.     Home Medications    Prior to Admission medications   Medication Sig Start Date End Date Taking? Authorizing Provider  cetirizine (ZYRTEC ALLERGY) 10 MG tablet Take 1 tablet (10 mg total) by mouth daily. 11/13/19 02/11/20  Claiborne Rigg, NP  diclofenac Sodium (VOLTAREN) 1 % GEL Apply 2 g topically 4 (four) times daily. 10/16/20   Anders Simmonds, PA-C  docusate sodium (COLACE) 100 MG capsule Take 1 capsule (100 mg total) by mouth every 12 (twelve) hours. 1 pill daily to induce regular bowel movement, as needed. Can increase to 2 pills daily. 11/11/20   Rhys Martini, PA-C  hydrocortisone (ANUSOL-HC) 2.5 % rectal cream place 1 application  rectally twice daily 11/11/20   Rhys Martini, PA-C  hydrocortisone 2.5 % cream Apply rectally twice daily 11/04/20   Merrilee Jansky, MD  meloxicam (MOBIC) 15 MG tablet Take 1 tablet (15 mg total) by mouth daily. 10/15/20   Meccariello, Solmon Ice, DO  mupirocin ointment (BACTROBAN) 2 % Apply 1 application topically 2 (two) times daily. 10/16/20   Anders Simmonds, PA-C  nystatin-triamcinolone ointment (MYCOLOG) Apply 1 application topically 2 (two) times daily. Patient not taking: Reported on 10/16/2020 07/21/20   Wallis Bamberg, PA-C  oseltamivir (TAMIFLU) 75 MG capsule Take 1 capsule (75 mg total) by mouth 2 (two) times daily. 11/18/20   Particia Nearing, PA-C  tizanidine (ZANAFLEX) 2 MG capsule Take 1 capsule (2 mg total) by mouth 3 (three) times daily. Patient not taking: Reported on 10/16/2020 08/11/20   Rhys Martini, PA-C  valACYclovir (VALTREX) 1000 MG tablet Take 1 tablet for 5 days at the start of an outbreak. Patient not taking: Reported on 10/16/2020 07/21/20   Wallis Bamberg, PA-C  SUMAtriptan (IMITREX) 50 MG tablet Take one for headache.  May repeat in 2 hours.  No more than 2 a day Patient not taking: Reported on 02/09/2018 05/27/17 07/26/18  Eustace Moore, MD    Family History Family History  Problem Relation Age of Onset   Cancer Father     Social History Social History   Tobacco Use  Smoking status: Former   Smokeless tobacco: Never  Building services engineer Use: Never used  Substance Use Topics   Alcohol use: No   Drug use: No     Allergies   Cyclobenzaprine, Methocarbamol, Amoxicillin, and Penicillins   Review of Systems Review of Systems  Constitutional:  Positive for fever.  HENT:  Positive for congestion and sore throat.   Respiratory:  Positive for cough. Negative for shortness of breath.   Neurological:  Positive for headaches.    Physical Exam Triage Vital Signs ED Triage Vitals  Enc Vitals Group     BP 11/18/20 1145 100/64     Pulse Rate  11/18/20 1145 94     Resp 11/18/20 1145 18     Temp 11/18/20 1145 99.3 F (37.4 C)     Temp src --      SpO2 11/18/20 1145 96 %     Weight --      Height --      Head Circumference --      Peak Flow --      Pain Score 11/18/20 1143 0     Pain Loc --      Pain Edu? --      Excl. in GC? --    No data found.  Updated Vital Signs BP 100/64   Pulse 94   Temp 99.3 F (37.4 C)   Resp 18   SpO2 96%   Visual Acuity Right Eye Distance:   Left Eye Distance:   Bilateral Distance:    Right Eye Near:   Left Eye Near:    Bilateral Near:     Physical Exam Constitutional:      Appearance: He is well-developed. He is ill-appearing.  HENT:     Right Ear: Tympanic membrane, ear canal and external ear normal.     Left Ear: Tympanic membrane, ear canal and external ear normal.     Nose: Congestion and rhinorrhea present.     Mouth/Throat:     Mouth: Mucous membranes are moist.     Pharynx: Oropharynx is clear.  Cardiovascular:     Rate and Rhythm: Normal rate and regular rhythm.  Pulmonary:     Effort: Pulmonary effort is normal.     Breath sounds: Normal breath sounds.     Comments: Occasional coughing Lymphadenopathy:     Cervical: No cervical adenopathy.  Neurological:     Mental Status: He is alert.     UC Treatments / Results  Labs (all labs ordered are listed, but only abnormal results are displayed) Labs Reviewed - No data to display  EKG   Radiology No results found.  Procedures Procedures (including critical care time)  Medications Ordered in UC Medications - No data to display  Initial Impression / Assessment and Plan / UC Course  I have reviewed the triage vital signs and the nursing notes.  Pertinent labs & imaging results that were available during my care of the patient were reviewed by me and considered in my medical decision making (see chart for details).    Reviewed normal course of influenza.  Prescribed Tamiflu.  Reviewed supportive care  measures.  Given note for work.  Final Clinical Impressions(s) / UC Diagnoses   Final diagnoses:  Influenza due to identified novel influenza A virus with other respiratory manifestations   Discharge Instructions   None    ED Prescriptions     Medication Sig Dispense Auth. Provider   oseltamivir (TAMIFLU) 75 MG  capsule  (Status: Discontinued) Take 1 capsule (75 mg total) by mouth 2 (two) times daily. 10 capsule Cathlyn Parsons, NP   oseltamivir (TAMIFLU) 75 MG capsule  (Status: Discontinued) Take 1 capsule (75 mg total) by mouth 2 (two) times daily. 10 capsule Particia Nearing, [place]   oseltamivir (TAMIFLU) 75 MG capsule Take 1 capsule (75 mg total) by mouth 2 (two) times daily. 10 capsule Particia Nearing, [place]      PDMP not reviewed this encounter.   Cathlyn Parsons, NP 11/21/20 1235

## 2020-11-26 ENCOUNTER — Ambulatory Visit: Payer: Self-pay | Admitting: Nurse Practitioner

## 2021-01-27 ENCOUNTER — Telehealth: Payer: Self-pay | Admitting: *Deleted

## 2021-01-27 NOTE — Telephone Encounter (Signed)
Copied from CRM [id number]. Topic: General - Other >> Jan 27, 2021  8:42 AM Jaquita Rector A wrote: Reason for CRM: Patient need a call back from [facility] to get a renewed [employer]. Can be reached at Ph# [phone]

## 2021-01-28 NOTE — Telephone Encounter (Signed)
I return Pt call. LVM to call back

## 2021-02-02 ENCOUNTER — Other Ambulatory Visit: Payer: Self-pay

## 2021-02-02 ENCOUNTER — Ambulatory Visit: Payer: Self-pay | Attending: Nurse Practitioner

## 2021-02-27 ENCOUNTER — Encounter (HOSPITAL_COMMUNITY): Payer: Self-pay | Admitting: Emergency Medicine

## 2021-02-27 ENCOUNTER — Other Ambulatory Visit: Payer: Self-pay

## 2021-02-27 ENCOUNTER — Ambulatory Visit (HOSPITAL_COMMUNITY)
Admission: EM | Admit: 2021-02-27 | Discharge: 2021-02-27 | Disposition: A | Discharge status: Home / Self Care | Payer: Self-pay | Attending: Family Medicine | Admitting: Family Medicine

## 2021-02-27 DIAGNOSIS — K0889 Other specified disorders of teeth and supporting structures: Secondary | ICD-10-CM

## 2021-02-27 MED ORDER — CLINDAMYCIN HCL 300 MG PO CAPS
300.0000 mg | ORAL_CAPSULE | Freq: Three times a day (TID) | ORAL | 0 refills | Status: AC
  Filled 2021-02-27: qty 21, 7d supply, fill #0

## 2021-02-27 NOTE — ED Provider Notes (Signed)
[facility]    CSN: [id number] Arrival date & time: 02/27/21  [id number]      History   Chief Complaint Chief Complaint  Patient presents with   Dental Pain    [name] is a 51 y.o. male.   Patient is here for dental pain. Having an upper tooth/gum that has been painful x 3 days.  He does have a bridge present there.  He did call a dentist but unable to get it.  No fever/chills.  Yesterday part of his eye and cheek were hurting.  He did call his dentist, and a medication was sent to the pharmacy.  Given azithromycin, but wants to make sure this is the right medication.    Past Medical History:  Diagnosis Date   Asthma    Herpes    Shingles     Patient Active Problem List   Diagnosis Date Noted   Acute bilateral low back pain without sciatica 06/05/2020   Acute pain of right shoulder 06/05/2020   Asthma due to environmental allergies 12/20/2016   HSV (herpes simplex virus) infection 02/24/2015   Anxiety 04/02/2014    History reviewed. No pertinent surgical history.     Home Medications    Prior to Admission medications   Medication Sig Start Date End Date Taking? Authorizing Provider  cetirizine (ZYRTEC ALLERGY) 10 MG tablet Take 1 tablet (10 mg total) by mouth daily. 11/13/19 02/11/20  Gildardo Pounds, NP  diclofenac Sodium (VOLTAREN) 1 % GEL Apply 2 g topically 4 (four) times daily. 10/16/20   Argentina Donovan, PA-C  docusate sodium (COLACE) 100 MG capsule Take 1 capsule (100 mg total) by mouth every 12 (twelve) hours. 1 pill daily to induce regular bowel movement, as needed. Can increase to 2 pills daily. 11/11/20   Hazel Sams, PA-C  hydrocortisone (ANUSOL-HC) 2.5 % rectal cream place 1 application rectally twice daily 11/11/20   Hazel Sams, PA-C  hydrocortisone 2.5 % cream Apply rectally twice daily 11/04/20   Chase Picket, MD  meloxicam (MOBIC) 15 MG tablet Take 1 tablet (15 mg total) by mouth daily. 10/15/20   Meccariello,  Bernita Raisin, DO  mupirocin ointment (BACTROBAN) 2 % Apply 1 application topically 2 (two) times daily. 10/16/20   Argentina Donovan, PA-C  oseltamivir (TAMIFLU) 75 MG capsule Take 1 capsule (75 mg total) by mouth 2 (two) times daily. 11/18/20   Volney American, PA-C  SUMAtriptan (IMITREX) 50 MG tablet Take one for headache.  May repeat in 2 hours.  No more than 2 a day Patient not taking: Reported on 02/09/2018 05/27/17 07/26/18  Raylene Everts, MD    Family History Family History  Problem Relation Age of Onset   Cancer Father     Social History Social History   Tobacco Use   Smoking status: Former   Smokeless tobacco: Never  Scientific laboratory technician Use: Never used  Substance Use Topics   Alcohol use: No   Drug use: No     Allergies   Cyclobenzaprine, Methocarbamol, Amoxicillin, and Penicillins   Review of Systems Review of Systems  Constitutional:  Negative for chills and fever.  HENT:  Positive for dental problem.   Eyes: Negative.   Respiratory: Negative.    Cardiovascular: Negative.   Gastrointestinal: Negative.   Genitourinary: Negative.     Physical Exam Triage Vital Signs ED Triage Vitals  Enc Vitals Group     BP 02/27/21 0810 114/69  Pulse Rate 02/27/21 0810 82     Resp 02/27/21 0810 16     Temp 02/27/21 0810 98.4 F (36.9 C)     Temp Source 02/27/21 0810 Oral     SpO2 02/27/21 0810 97 %     Weight 02/27/21 0811 184 lb 15.5 oz (83.9 kg)     Height 02/27/21 0811 5\' 5"  (1.651 m)     Head Circumference --      Peak Flow --      Pain Score 02/27/21 0811 6     Pain Loc --      Pain Edu? --      Excl. in [place]? --    No data found.  Updated Vital Signs BP 114/69 (BP Location: Left Arm)    Pulse 82    Temp 98.4 F (36.9 C) (Oral)    Resp 16    Ht 5\' 5"  (1.651 m)    Wt 83.9 kg    SpO2 97%    BMI 30.78 kg/m   Visual Acuity Right Eye Distance:   Left Eye Distance:   Bilateral Distance:    Right Eye Near:   Left Eye Near:    Bilateral Near:      Physical Exam Constitutional:      Appearance: Normal appearance.  HENT:     Head: Normocephalic and atraumatic.     Comments: There is a bridge present at the right upper gums;  there is an area of redness, swelling, and white pockets at the right upper gum;  this area is tender;     Nose: Nose normal.  Cardiovascular:     Rate and Rhythm: Normal rate and regular rhythm.  Pulmonary:     Effort: Pulmonary effort is normal.     Breath sounds: Normal breath sounds.  Musculoskeletal:     Cervical back: Normal range of motion.  Neurological:     Mental Status: He is alert.     UC Treatments / Results  Labs (all labs ordered are listed, but only abnormal results are displayed) Labs Reviewed - No data to display  EKG   Radiology No results found.  Procedures Procedures (including critical care time)  Medications Ordered in UC Medications - No data to display  Initial Impression / Assessment and Plan / UC Course  I have reviewed the triage vital signs and the nursing notes.  Pertinent labs & imaging results that were available during my care of the patient were reviewed by me and considered in my medical decision making (see chart for details).    Final Clinical Impressions(s) / UC Diagnoses   Final diagnoses:  Pain, dental     Discharge Instructions      You were seen today for dental infection.  Please stop the azithromycin given by your dentist and start the clindamycin three times/day x 7 days.  Please follow up with your dentist for you dental needs.   You may use tylenol or motirn for pain/swelling.     ED Prescriptions     Medication Sig Dispense Auth. Provider   clindamycin (CLEOCIN) 300 MG capsule Take 1 capsule (300 mg total) by mouth 3 (three) times daily for 7 days. 21 capsule Rondel Oh, MD      PDMP not reviewed this encounter.   Rondel Oh, MD 02/27/21 [phone]

## 2021-02-27 NOTE — Discharge Instructions (Addendum)
You were seen today for dental infection.  Please stop the azithromycin given by your dentist and start the clindamycin three times/day x 7 days.  Please follow up with your dentist for you dental needs.   You may use tylenol or motirn for pain/swelling.

## 2021-02-27 NOTE — ED Triage Notes (Signed)
Pt reports of upper dental pain x 3 days.

## 2021-04-05 ENCOUNTER — Other Ambulatory Visit: Payer: Self-pay

## 2021-04-05 ENCOUNTER — Emergency Department (HOSPITAL_COMMUNITY)
Admission: EM | Admit: 2021-04-05 | Discharge: 2021-04-05 | Disposition: A | Discharge status: Home / Self Care | Payer: Self-pay | Attending: Emergency Medicine | Admitting: Emergency Medicine

## 2021-04-05 ENCOUNTER — Encounter (HOSPITAL_COMMUNITY): Payer: Self-pay | Admitting: Emergency Medicine

## 2021-04-05 DIAGNOSIS — K5900 Constipation, unspecified: Secondary | ICD-10-CM | POA: Insufficient documentation

## 2021-04-05 DIAGNOSIS — K649 Unspecified hemorrhoids: Secondary | ICD-10-CM | POA: Insufficient documentation

## 2021-04-05 DIAGNOSIS — K594 Anal spasm: Secondary | ICD-10-CM

## 2021-04-05 DIAGNOSIS — K6289 Other specified diseases of anus and rectum: Secondary | ICD-10-CM | POA: Insufficient documentation

## 2021-04-05 LAB — CBC
HCT: 46.3 % (ref 39.0–52.0)
Hemoglobin: 15.9 g/dL (ref 13.0–17.0)
MCH: 29 pg (ref 26.0–34.0)
MCHC: 34.3 g/dL (ref 30.0–36.0)
MCV: 84.3 fL (ref 80.0–100.0)
Platelets: 216 10*3/uL (ref 150–400)
RBC: 5.49 MIL/uL (ref 4.22–5.81)
RDW: 12.9 % (ref 11.5–15.5)
WBC: 4.6 10*3/uL (ref 4.0–10.5)
nRBC: 0 % (ref 0.0–0.2)

## 2021-04-05 LAB — COMPREHENSIVE METABOLIC PANEL
ALT: 27 U/L (ref 0–44)
AST: 27 U/L (ref 15–41)
Albumin: 4.2 g/dL (ref 3.5–5.0)
Alkaline Phosphatase: 62 U/L (ref 38–126)
Anion gap: 8 (ref 5–15)
BUN: 8 mg/dL (ref 6–20)
CO2: 24 mmol/L (ref 22–32)
Calcium: 9 mg/dL (ref 8.9–10.3)
Chloride: 105 mmol/L (ref 98–111)
Creatinine, Ser: 0.86 mg/dL (ref 0.61–1.24)
GFR, Estimated: >60 mL/min (ref >60)
Glucose, Bld: 107 mg/dL — ABNORMAL HIGH (ref 70–99)
Potassium: 3.7 mmol/L (ref 3.5–5.1)
Sodium: 137 mmol/L (ref 135–145)
Total Bilirubin: 0.5 mg/dL (ref 0.3–1.2)
Total Protein: 7.1 g/dL (ref 6.5–8.1)

## 2021-04-05 MED ORDER — BISACODYL 5 MG PO TBEC: 5.0000 mg | DELAYED_RELEASE_TABLET | Freq: Every day | ORAL | 0 refills | Status: DC | PRN

## 2021-04-05 MED ORDER — NITROGLYCERIN 0.4 % RE OINT: 1.0000 | TOPICAL_OINTMENT | Freq: Two times a day (BID) | RECTAL | 0 refills | Status: DC | PRN

## 2021-04-05 MED ORDER — GLYCERIN (ADULT) 2 G RE SUPP: 1.0000 | RECTAL | 0 refills | Status: DC | PRN

## 2021-04-05 NOTE — Discharge Instructions (Addendum)
I prescribed you medication to help with your constipation and the pain in your anus.  This is called nitroglycerin ointment.  You will put a very small amount onto a suppository tablet.  You will insert this into the rectum 2 times a day, allowing it to sit inside the rectum for 30 minutes.  Then you should be able to have a bowel movement.  The ointment will help relax the muscles of the anus. ? ?Remember to wash your hands after touching this ointment.  The nitroglycerin ointment can cause headaches and flushed sweats and lightheadedness.  The symptoms typically go away after 30 minutes.  If you are having severe symptoms, or feel too dizzy or nauseous taking this medication, please stop taking the medicine. ?

## 2021-04-05 NOTE — ED Provider Notes (Signed)
?[facility] ?Provider Note ? ? ?CSN: [id number] ?Arrival date & time: 04/05/21  1323 ? ?  ? ?History ? ?Chief Complaint  ?Patient presents with  ? Abdominal Pain  ? ? ?Gabriel Ball is a 51 y.o. male here with hemorrhoid pain, constipation, using hemorrhoid cream at home with little relief.  Sharp rectal pain and straining with BM. ? ?Spanish interpreter used ? ?HPI ? ?  ? ?Home Medications ?Prior to Admission medications   ?Medication Sig Start Date End Date Taking? Authorizing Provider  ?bisacodyl (DULCOLAX) 5 MG EC tablet Take 1 tablet (5 mg total) by mouth daily as needed for up to 15 doses for moderate constipation. 04/05/21   Wyvonnia Dusky, MD  ?cetirizine (ZYRTEC ALLERGY) 10 MG tablet Take 1 tablet (10 mg total) by mouth daily. 11/13/19 02/11/20  Gildardo Pounds, NP  ?diclofenac Sodium (VOLTAREN) 1 % GEL Apply 2 g topically 4 (four) times daily. 10/16/20   Argentina Donovan, PA-C  ?docusate sodium (COLACE) 100 MG capsule Take 1 capsule (100 mg total) by mouth every 12 (twelve) hours. 1 pill daily to induce regular bowel movement, as needed. Can increase to 2 pills daily. 11/11/20   Hazel Sams, PA-C  ?glycerin adult 2 g suppository Place 1 suppository rectally as needed for up to 25 doses for constipation. 04/05/21   Wyvonnia Dusky, MD  ?hydrocortisone (ANUSOL-HC) 2.5 % rectal cream place 1 application rectally twice daily 11/11/20   Hazel Sams, PA-C  ?hydrocortisone 2.5 % cream Apply rectally twice daily 11/04/20   Lamptey, Myrene Galas, MD  ?meloxicam (MOBIC) 15 MG tablet Take 1 tablet (15 mg total) by mouth daily. 10/15/20   Meccariello, Bernita Raisin, DO  ?mupirocin ointment (BACTROBAN) 2 % Apply 1 application topically 2 (two) times daily. 10/16/20   Argentina Donovan, PA-C  ?Nitroglycerin 0.4 % OINT Place 1 Dose rectally 2 (two) times daily as needed. Apply pea-sized amount to suppository and insert into rectum. Wash hands thoroughly after use. 04/05/21    Wyvonnia Dusky, MD  ?oseltamivir (TAMIFLU) 75 MG capsule Take 1 capsule (75 mg total) by mouth 2 (two) times daily. 11/18/20   Volney American, PA-C  ?SUMAtriptan (IMITREX) 50 MG tablet Take one for headache.  May repeat in 2 hours.  No more than 2 a day ?Patient not taking: Reported on 02/09/2018 05/27/17 07/26/18  Raylene Everts, MD  ?   ? ?Allergies    ?Cyclobenzaprine, Methocarbamol, Amoxicillin, and Penicillins   ? ?Review of Systems   ?Review of Systems ? ?Physical Exam ?Updated Vital Signs ?BP 106/67   Pulse 74   Temp 98.9 ?F (37.2 ?C) (Oral)   Resp 18   SpO2 100%  ?Physical Exam ?Constitutional:   ?   General: He is not in acute distress. ?HENT:  ?   Head: Normocephalic and atraumatic.  ?Eyes:  ?   Conjunctiva/sclera: Conjunctivae normal.  ?   Pupils: Pupils are equal, round, and reactive to light.  ?Cardiovascular:  ?   Rate and Rhythm: Normal rate and regular rhythm.  ?Pulmonary:  ?   Effort: Pulmonary effort is normal. No respiratory distress.  ?Abdominal:  ?   General: There is no distension.  ?   Tenderness: There is no abdominal tenderness.  ?Genitourinary: ?   Comments: Internal hemorrhoid with a rectal fissure, anal spasm ?Skin: ?   General: Skin is warm and dry.  ?Neurological:  ?   General: No focal deficit  present.  ?   Mental Status: He is alert. Mental status is at baseline.  ?Psychiatric:     ?   Mood and Affect: Mood normal.     ?   Behavior: Behavior normal.  ? ? ?ED Results / Procedures / Treatments   ?Labs ?(all labs ordered are listed, but only abnormal results are displayed) ?Labs Reviewed  ?COMPREHENSIVE METABOLIC PANEL - Abnormal; Notable for the following components:  ?    Result Value  ? Glucose, Bld 107 (*)   ? All other components within normal limits  ?CBC  ? ? ?EKG ?None ? ?Radiology ?No results found. ? ?Procedures ?Procedures  ? ? ?Medications Ordered in ED ?Medications - No data to display ? ?ED Course/ Medical Decision Making/ A&P ?  ?                         ?Medical Decision Making ?Risk ?OTC drugs. ?Prescription drug management. ? ? ?Patient is here with rectal pain and constipation, appears to have a likely anal fissure on exam with spasms significant pain on digital exam.  This likely the cause of his constipation.  I would recommend suppositories coated with nitroglycerin ointment, we discussed this at length using Spanish translator regarding appropriate use of nitroglycerin, cleaning his hands.  Follow-up with a colorectal clinic if this persist.  Patient verbalized understanding. ? ?He is already using hemorrhoid ointment BID ? ? ? ? ? ? ? ?Final Clinical Impression(s) / ED Diagnoses ?Final diagnoses:  ?Hemorrhoids, unspecified hemorrhoid type  ?Rectal sphincter spasm  ? ? ?Rx / DC Orders ?ED Discharge Orders   ? ?      Ordered  ?  glycerin adult 2 g suppository  As needed,   Status:  Discontinued       ? 04/05/21 1748  ?  bisacodyl (DULCOLAX) 5 MG EC tablet  Daily PRN,   Status:  Discontinued       ? 04/05/21 1748  ?  Nitroglycerin 0.4 % OINT  2 times daily PRN,   Status:  Discontinued       ?Note to Pharmacy: Please formulate per pharmacy protocol for anal fissure pain.  Can provide smaller container or total amount if needed.  ? 04/05/21 1748  ?  bisacodyl (DULCOLAX) 5 MG EC tablet  Daily PRN       ? 04/05/21 1749  ?  glycerin adult 2 g suppository  As needed       ? 04/05/21 1749  ?  Nitroglycerin 0.4 % OINT  2 times daily PRN       ?Note to Pharmacy: Please formulate per pharmacy protocol for anal fissure pain.  Can provide smaller container or total amount if needed.  ? 04/05/21 1749  ? ?  ?  ? ?  ? ? ?  ?Wyvonnia Dusky, MD ?04/05/21 2203 ? ?

## 2021-04-05 NOTE — ED Provider Triage Note (Signed)
Emergency Medicine Provider Triage Evaluation Note ? ?Eloy Fehl , a 51 y.o. male  was evaluated in triage.  Pt complains of hemorrhoids. States that same have been bleeding for 2 weeks with blood visualized only on toilet paper and associated rectal pain. Hx of same in the past, states that he took an herbal supplement which did not help.  Denies anticoagulation, dizziness, abdominal pain, nausea, vomiting, diarrhea. ? ?Review of Systems  ?Positive:  ?Negative: See above ? ?Physical Exam  ?BP 114/67 (BP Location: Left Arm)   Pulse 73   Temp 98.9 ?F (37.2 ?C) (Oral)   Resp 17   SpO2 97%  ?Gen:   Awake, no distress  ?Resp:  Normal effort  ?MSK:   Moves extremities without difficulty  ?Other:   ? ?Medical Decision Making  ?Medically screening exam initiated at 2:01 PM.  Appropriate orders placed.  Pharoah Goggins was informed that the remainder of the evaluation will be completed by another provider, this initial triage assessment does not replace that evaluation, and the importance of remaining in the ED until their evaluation is complete. ? ? ?  ?Silva Bandy, PA-C ?04/05/21 1403 ? ?

## 2021-04-05 NOTE — ED Triage Notes (Signed)
Patient complains of worsen than normal bleeding hemorrhoids for the last two weeks, denies anticoagulation, denies dizziness and abdominal pain. Patient is alert, oriented, ambulatory, and in no apparent distress at this time. ?

## 2021-04-06 ENCOUNTER — Other Ambulatory Visit: Payer: Self-pay | Admitting: Nurse Practitioner

## 2021-04-06 ENCOUNTER — Other Ambulatory Visit: Payer: Self-pay

## 2021-04-06 DIAGNOSIS — K649 Unspecified hemorrhoids: Secondary | ICD-10-CM

## 2021-04-06 MED ORDER — LIDOCAINE (ANORECTAL) 5 % EX GEL
CUTANEOUS | 1 refills | Status: DC
  Filled 2021-04-06: qty 30, fill #0

## 2021-04-06 MED ORDER — LIDOCAINE (ANORECTAL) 5 % EX CREA
TOPICAL_CREAM | CUTANEOUS | 1 refills | Status: DC
  Filled 2021-04-06: qty 30, 5d supply, fill #0

## 2021-04-07 ENCOUNTER — Other Ambulatory Visit: Payer: Self-pay

## 2021-04-08 ENCOUNTER — Ambulatory Visit (HOSPITAL_COMMUNITY)
Admission: EM | Admit: 2021-04-08 | Discharge: 2021-04-08 | Disposition: A | Discharge status: Home / Self Care | Payer: Self-pay | Attending: Physician Assistant | Admitting: Physician Assistant

## 2021-04-08 ENCOUNTER — Other Ambulatory Visit: Payer: Self-pay

## 2021-04-08 ENCOUNTER — Encounter (HOSPITAL_COMMUNITY): Payer: Self-pay

## 2021-04-08 DIAGNOSIS — R300 Dysuria: Secondary | ICD-10-CM

## 2021-04-08 DIAGNOSIS — K649 Unspecified hemorrhoids: Secondary | ICD-10-CM

## 2021-04-08 LAB — POCT URINALYSIS DIPSTICK, ED / UC
Bilirubin Urine: NEGATIVE
Glucose, UA: NEGATIVE mg/dL
Hgb urine dipstick: NEGATIVE
Ketones, ur: NEGATIVE mg/dL
Leukocytes,Ua: NEGATIVE
Nitrite: NEGATIVE
Protein, ur: NEGATIVE mg/dL
Specific Gravity, Urine: 1.01 (ref 1.005–1.030)
Urobilinogen, UA: 0.2 mg/dL (ref 0.0–1.0)
pH: 6 (ref 5.0–8.0)

## 2021-04-08 MED ORDER — DOXYCYCLINE HYCLATE 100 MG PO TABS
100.0000 mg | ORAL_TABLET | Freq: Two times a day (BID) | ORAL | 0 refills | Status: DC
  Filled 2021-04-08: qty 20, 10d supply, fill #0

## 2021-04-08 NOTE — ED Provider Notes (Signed)
?[facility] ? ? ? ?CSN: [id number] ?Arrival date & time: 04/08/21  0808 ? ? ?  ? ?History   ?Chief Complaint ?Chief Complaint  ?Patient presents with  ? Abdominal Pain  ? ? ?HPI ?Gabriel Ball is a 51 y.o. male.  ? ?My complains of hemorrhoids and lower abdominal discomfort.  Pt was seen in ED 3 days ago.  Pt started medications yesterday.  Pt is worried that he has a prostate infection.  Pt is on medications for constipation  ? ? ?Abdominal Pain ?Pain location:  Suprapubic ?Pain quality: aching   ?Pain radiates to:  Does not radiate ?Pain severity:  Mild ?Onset quality:  Gradual ?Duration:  3 days ?Timing:  Constant ?Progression:  Worsening ?Context: laxative use   ?Worsened by:  Nothing ?Ineffective treatments:  None tried ? ?Past Medical History:  ?Diagnosis Date  ? Asthma   ? Herpes   ? Shingles   ? ? ?Patient Active Problem List  ? Diagnosis Date Noted  ? Acute bilateral low back pain without sciatica 06/05/2020  ? Acute pain of right shoulder 06/05/2020  ? Asthma due to environmental allergies 12/20/2016  ? 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 Anxiety 04/02/2014  ? ? ?History reviewed. No pertinent surgical history. ? ? ? ? ?Home Medications   ? ?Prior to Admission medications   ?Medication Sig Start Date End Date Taking? Authorizing Provider  ?doxycycline (VIBRA-TABS) 100 MG tablet Take 1 tablet (100 mg total) by mouth 2 (two) times daily. 04/08/21  Yes Cheron SchaumannSofia, Honest Vanleer K, PA-C  ?bisacodyl (DULCOLAX) 5 MG EC tablet Take 1 tablet (5 mg total) by mouth daily as needed for up to 15 doses for moderate constipation. 04/05/21   Terald Sleeperrifan, Matthew J, MD  ?cetirizine (ZYRTEC ALLERGY) 10 MG tablet Take 1 tablet (10 mg total) by mouth daily. 11/13/19 02/11/20  Claiborne RiggFleming, Zelda W, NP  ?diclofenac Sodium (VOLTAREN) 1 % GEL Apply 2 g topically 4 (four) times daily. 10/16/20   Anders SimmondsMcClung, Angela M, PA-C  ?docusate sodium (COLACE) 100 MG capsule Take 1 capsule (100 mg total) by mouth every 12 (twelve) hours.  1 pill daily to induce regular bowel movement, as needed. Can increase to 2 pills daily. 11/11/20   Rhys MartiniGraham, Laura E, PA-C  ?glycerin adult 2 g suppository Place 1 suppository rectally as needed for up to 25 doses for constipation. 04/05/21   Terald Sleeperrifan, Matthew J, MD  ?hydrocortisone (ANUSOL-HC) 2.5 % rectal cream place 1 application rectally twice daily 11/11/20   Rhys MartiniGraham, Laura E, PA-C  ?hydrocortisone 2.5 % cream Apply rectally twice daily 11/04/20   Merrilee JanskyLamptey, Philip O, MD  ?Lidocaine, Anorectal, 5 % CREA Apply topically as needed 04/06/21   Claiborne RiggFleming, Zelda W, NP  ?meloxicam (MOBIC) 15 MG tablet Take 1 tablet (15 mg total) by mouth daily. 10/15/20   Meccariello, Solmon IceBailey J, DO  ?mupirocin ointment (BACTROBAN) 2 % Apply 1 application topically 2 (two) times daily. 10/16/20   Anders SimmondsMcClung, Angela M, PA-C  ?Nitroglycerin 0.4 % OINT Place 1 Dose rectally 2 (two) times daily as needed. Apply pea-sized amount to suppository and insert into rectum. Wash hands thoroughly after use. 04/05/21   Terald Sleeperrifan, Matthew J, MD  ?oseltamivir (TAMIFLU) 75 MG capsule Take 1 capsule (75 mg total) by mouth 2 (two) times daily. 11/18/20   Particia NearingLane, Rachel Elizabeth, PA-C  ?SUMAtriptan (IMITREX) 50 MG tablet Take one for headache.  May repeat in 2 hours.  No more than 2 a day ?Patient not taking: Reported on  02/09/2018 05/27/17 07/26/18  Eustace Moore, MD  ? ? ?Family History ?Family History  ?Problem Relation Age of Onset  ? Cancer Father   ? ? ?Social History ?Social History  ? ?Tobacco Use  ? Smoking status: Former  ? Smokeless tobacco: Never  ?Vaping Use  ? Vaping Use: Never used  ?Substance Use Topics  ? Alcohol use: No  ? Drug use: No  ? ? ? ?Allergies   ?Cyclobenzaprine, Methocarbamol, Amoxicillin, and Penicillins ? ? ?Review of Systems ?Review of Systems  ?Gastrointestinal:  Positive for abdominal pain.  ?All other systems reviewed and are negative. ? ? ?Physical Exam ?Triage Vital Signs ?ED Triage Vitals  ?Enc Vitals Group  ?   BP 04/08/21 0840 105/63  ?    Pulse Rate 04/08/21 0840 64  ?   Resp 04/08/21 0840 18  ?   Temp 04/08/21 0840 98.1 ?F (36.7 ?C)  ?   Temp Source 04/08/21 0840 Oral  ?   SpO2 04/08/21 0840 99 %  ?   Weight --   ?   Height --   ?   Head Circumference --   ?   Peak Flow --   ?   Pain Score 04/08/21 0846 0  ?   Pain Loc --   ?   Pain Edu? --   ?   Excl. in GC? --   ? ?No data found. ? ?Updated Vital Signs ?BP 105/63 (BP Location: Left Arm)   Pulse 64   Temp 98.1 ?F (36.7 ?C) (Oral)   Resp 18   SpO2 99%  ? ?Visual Acuity ?Right Eye Distance:   ?Left Eye Distance:   ?Bilateral Distance:   ? ?Right Eye Near:   ?Left Eye Near:    ?Bilateral Near:    ? ?Physical Exam ?Vitals reviewed.  ?Constitutional:   ?   Appearance: He is well-developed.  ?HENT:  ?   Head: Normocephalic.  ?Eyes:  ?   Extraocular Movements: Extraocular movements intact.  ?Cardiovascular:  ?   Rate and Rhythm: Normal rate.  ?Pulmonary:  ?   Effort: Pulmonary effort is normal.  ?Abdominal:  ?   General: Abdomen is flat. Bowel sounds are normal.  ?   Palpations: Abdomen is soft.  ?Skin: ?   General: Skin is warm.  ?Neurological:  ?   General: No focal deficit present.  ?   Mental Status: He is alert.  ? ? ? ?UC Treatments / Results  ?Labs ?(all labs ordered are listed, but only abnormal results are displayed) ?Labs Reviewed  ?URINALYSIS, DIPSTICK ONLY  ?POCT URINALYSIS DIPSTICK, ED / UC  ? ? ?EKG ? ? ?Radiology ?No results found. ? ?Procedures ?Procedures (including critical care time) ? ?Medications Ordered in UC ?Medications - No data to display ? ?Initial Impression / Assessment and Plan / UC Course  ?I have reviewed the triage vital signs and the nursing notes. ? ?Pertinent labs & imaging results that were available during my care of the patient were reviewed by me and considered in my medical decision making (see chart for details). ? ?  ? ?POC ua is negative.  I will treat with doxycycline.  Pt is advised to continue medications for hemorrhoids.  Pt is advised to see his  primary MD for recheck.   ?Final Clinical Impressions(s) / UC Diagnoses  ? ?Final diagnoses:  ?Dysuria  ?Hemorrhoids, unspecified hemorrhoid type  ? ? ? ?Discharge Instructions   ? ?  ?Continue current medications.  Take  antibiotic as directed  ? ? ?ED Prescriptions   ? ? Medication Sig Dispense Auth. Provider  ? doxycycline (VIBRA-TABS) 100 MG tablet Take 1 tablet (100 mg total) by mouth 2 (two) times daily. 20 tablet Elson Areas, [place]  ? ?  ? ?PDMP not reviewed this encounter. ?  ?Elson Areas, PA-C ?04/08/21 1552 ? ?

## 2021-04-08 NOTE — ED Triage Notes (Signed)
4 day h/o intermittent lower abdominal pain. No v/d. Pt requests paper prescription.  ?

## 2021-04-08 NOTE — Discharge Instructions (Addendum)
Continue current medications.  Take antibiotic as directed  ?

## 2021-04-15 ENCOUNTER — Other Ambulatory Visit: Payer: Self-pay

## 2021-04-15 MED ORDER — AMOXICILLIN 250 MG/5ML PO SUSR
ORAL | 0 refills | Status: DC
  Filled 2021-04-15: qty 80, 7d supply, fill #0

## 2021-04-15 MED ORDER — CHLORHEXIDINE GLUCONATE 0.12 % MT SOLN
OROMUCOSAL | 2 refills | Status: DC
  Filled 2021-04-15: qty 473, 15d supply, fill #0

## 2021-04-15 MED ORDER — ACETAMINOPHEN 500 MG PO TABS: ORAL_TABLET | ORAL | 0 refills | Status: DC

## 2021-04-16 ENCOUNTER — Other Ambulatory Visit: Payer: Self-pay

## 2021-04-20 ENCOUNTER — Ambulatory Visit (HOSPITAL_COMMUNITY)
Admission: EM | Admit: 2021-04-20 | Discharge: 2021-04-20 | Disposition: A | Discharge status: Home / Self Care | Payer: Self-pay | Attending: Family Medicine | Admitting: Family Medicine

## 2021-04-20 DIAGNOSIS — K0889 Other specified disorders of teeth and supporting structures: Secondary | ICD-10-CM

## 2021-04-20 NOTE — ED Triage Notes (Signed)
Pt presents to the office for dental pain and swelling. He had a procedure on 04/08/2021 with a dentist, he went back for follow up care and the dentist told him something was not right. He is here to follow up. I advised patient we are urgent care and the dental exam would need to be perform by a dentist.  ?

## 2021-04-20 NOTE — Discharge Instructions (Addendum)
Please go see the dentist for followup(por favor, vayase a consultar con el dentista a revisar donde Ud tuvo cirugia) ?

## 2021-04-20 NOTE — ED Provider Notes (Signed)
?[facility] ? ? ? ?CSN: [id number] ?Arrival date & time: 04/20/21  0815 ? ? ?  ? ?History   ?Chief Complaint ?Chief Complaint  ?Patient presents with  ? Dental Pain  ? ? ?HPI ?Gabriel Ball is a 51 y.o. male.  ? ? ?Dental Pain ?Here for recheck on recent procedure done by a dentist (?root canal). It feels fine, no pain or swelling there. He wants [place] to make sure it looks ok, as he is scared to go back to same dentist. ? ?No f/c/drainage from dental ridge/gums. ? ?He is afraid to shave on the face ? ?Past Medical History:  ?Diagnosis Date  ? Asthma   ? Herpes   ? Shingles   ? ? ?Patient Active Problem List  ? Diagnosis Date Noted  ? Acute bilateral low back pain without sciatica 06/05/2020  ? Acute pain of right shoulder 06/05/2020  ? Asthma due to environmental allergies 12/20/2016  ? 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 Anxiety 04/02/2014  ? ? ?No past surgical history on file. ? ? ? ? ?Home Medications   ? ?Prior to Admission medications   ?Medication Sig Start Date End Date Taking? Authorizing Provider  ?acetaminophen (TYLENOL) 500 MG tablet Take 2 tablet By Mouth every six to eight hours 04/15/21     ?bisacodyl (DULCOLAX) 5 MG EC tablet Take 1 tablet (5 mg total) by mouth daily as needed for up to 15 doses for moderate constipation. 04/05/21   Terald Sleeper, MD  ?cetirizine (ZYRTEC ALLERGY) 10 MG tablet Take 1 tablet (10 mg total) by mouth daily. 11/13/19 02/11/20  Claiborne Rigg, NP  ?chlorhexidine (PERIDEX) 0.12 % solution Swish 15 ml by mouth twice a day. Spit, do not swallow. 04/15/21     ?diclofenac Sodium (VOLTAREN) 1 % GEL Apply 2 g topically 4 (four) times daily. 10/16/20   Anders Simmonds, PA-C  ?docusate sodium (COLACE) 100 MG capsule Take 1 capsule (100 mg total) by mouth every 12 (twelve) hours. 1 pill daily to induce regular bowel movement, as needed. Can increase to 2 pills daily. 11/11/20   Rhys Martini, PA-C  ?doxycycline (VIBRA-TABS) 100 MG tablet Take 1 tablet  (100 mg total) by mouth 2 (two) times daily. 04/08/21   Elson Areas, PA-C  ?glycerin adult 2 g suppository Place 1 suppository rectally as needed for up to 25 doses for constipation. 04/05/21   Terald Sleeper, MD  ?hydrocortisone (ANUSOL-HC) 2.5 % rectal cream place 1 application rectally twice daily 11/11/20   Rhys Martini, PA-C  ?hydrocortisone 2.5 % cream Apply rectally twice daily 11/04/20   Merrilee Jansky, MD  ?Lidocaine, Anorectal, 5 % CREA Apply topically as needed 04/06/21   Claiborne Rigg, NP  ?meloxicam (MOBIC) 15 MG tablet Take 1 tablet (15 mg total) by mouth daily. 10/15/20   Meccariello, Solmon Ice, DO  ?mupirocin ointment (BACTROBAN) 2 % Apply 1 application topically 2 (two) times daily. 10/16/20   Anders Simmonds, PA-C  ?Nitroglycerin 0.4 % OINT Place 1 Dose rectally 2 (two) times daily as needed. Apply pea-sized amount to suppository and insert into rectum. Wash hands thoroughly after use. 04/05/21   Terald Sleeper, MD  ?SUMAtriptan (IMITREX) 50 MG tablet Take one for headache.  May repeat in 2 hours.  No more than 2 a day ?Patient not taking: Reported on 02/09/2018 05/27/17 07/26/18  Eustace Moore, MD  ? ? ?Family History ?Family History  ?Problem Relation  Age of Onset  ? Cancer Father   ? ? ?Social History ?Social History  ? ?Tobacco Use  ? Smoking status: Former  ? Smokeless tobacco: Never  ?Vaping Use  ? Vaping Use: Never used  ?Substance Use Topics  ? Alcohol use: No  ? Drug use: No  ? ? ? ?Allergies   ?Cyclobenzaprine, Methocarbamol, Amoxicillin, and Penicillins ? ? ?Review of Systems ?Review of Systems ? ? ?Physical Exam ?Triage Vital Signs ?ED Triage Vitals [04/20/21 0818]  ?Enc Vitals Group  ?   BP 108/71  ?   Pulse Rate (!) 50  ?   Resp 16  ?   Temp   ?   Temp src   ?   SpO2 100 %  ?   Weight   ?   Height   ?   Head Circumference   ?   Peak Flow   ?   Pain Score 7  ?   Pain Loc   ?   Pain Edu?   ?   Excl. in GC?   ? ?No data found. ? ?Updated Vital Signs ?BP 108/71 (BP  Location: Left Arm)   Pulse (!) 50   Resp 16   SpO2 100%  ? ?Visual Acuity ?Right Eye Distance:   ?Left Eye Distance:   ?Bilateral Distance:   ? ?Right Eye Near:   ?Left Eye Near:    ?Bilateral Near:    ? ?Physical Exam ?Vitals and nursing note reviewed.  ?Constitutional:   ?   General: He is not in acute distress. ?   Appearance: He is not toxic-appearing or diaphoretic.  ?HENT:  ?   Mouth/Throat:  ?   Mouth: Mucous membranes are moist.  ?   Comments: There is mild erythema of the gum line just above the anterior teeth ?Skin: ?   Coloration: Skin is not jaundiced or pale.  ?Neurological:  ?   General: No focal deficit present.  ? ? ? ?UC Treatments / Results  ?Labs ?(all labs ordered are listed, but only abnormal results are displayed) ?Labs Reviewed - No data to display ? ?EKG ? ? ?Radiology ?No results found. ? ?Procedures ?Procedures (including critical care time) ? ?Medications Ordered in UC ?Medications - No data to display ? ?Initial Impression / Assessment and Plan / UC Course  ?I have reviewed the triage vital signs and the nursing notes. ? ?Pertinent labs & imaging results that were available during my care of the patient were reviewed by me and considered in my medical decision making (see chart for details). ? ?  ? ?Reassurance given about the appearance of his gums, but explained that I still think he needs to f/u with the dentist. OK to shave ?Final Clinical Impressions(s) / UC Diagnoses  ? ?Final diagnoses:  ?Dentalgia  ? ? ? ?Discharge Instructions   ? ?  ?Please go see the dentist for followup(por favor, vayase a consultar con el dentista a revisar donde Ud tuvo cirugia) ? ? ? ? ?ED Prescriptions   ?None ?  ? ?PDMP not reviewed this encounter. ?  ?Zenia Resides, MD ?04/20/21 0845 ? ?

## 2021-04-21 ENCOUNTER — Other Ambulatory Visit: Payer: Self-pay

## 2021-05-03 ENCOUNTER — Other Ambulatory Visit: Payer: Self-pay

## 2021-05-03 ENCOUNTER — Encounter (HOSPITAL_COMMUNITY): Payer: Self-pay | Admitting: *Deleted

## 2021-05-03 ENCOUNTER — Ambulatory Visit (HOSPITAL_COMMUNITY)
Admission: EM | Admit: 2021-05-03 | Discharge: 2021-05-03 | Disposition: A | Discharge status: Home / Self Care | Payer: Self-pay | Attending: Emergency Medicine | Admitting: Emergency Medicine

## 2021-05-03 DIAGNOSIS — Z760 Encounter for issue of repeat prescription: Secondary | ICD-10-CM

## 2021-05-03 DIAGNOSIS — B009 Herpesviral infection, unspecified: Secondary | ICD-10-CM

## 2021-05-03 MED ORDER — VALACYCLOVIR HCL 1 G PO TABS
1000.0000 mg | ORAL_TABLET | Freq: Every day | ORAL | 2 refills | Status: DC
  Filled 2021-05-03: qty 10, 10d supply, fill #0

## 2021-05-03 MED ORDER — VALACYCLOVIR HCL 1 G PO TABS: 1000.0000 mg | ORAL_TABLET | Freq: Every day | ORAL | 2 refills | Status: AC

## 2021-05-03 NOTE — ED Triage Notes (Signed)
Pt here for refill of acyclovir. Pt reports his tongue started to feel like an out break was starting. ?

## 2021-05-03 NOTE — ED Provider Notes (Signed)
?[facility] ? ? ? ?CSN: [id number] ?Arrival date & time: 05/03/21  1714 ?  ? ?HISTORY  ? ?Chief Complaint  ?Patient presents with  ? Medication Refill  ? ?HPI ?Gabriel Ball is a 51 y.o. male. Patient presents for refill of acyclovir, patient reports his tongue started to feel like he was having an outbreak that began yesterday.  Patient was most recently provided with a prescription for valacyclovir for HSV 1, 1 g daily for 5 days.. ? ?The history is provided by the patient.  ?Past Medical History:  ?Diagnosis Date  ? Asthma   ? Herpes   ? Shingles   ? ?Patient Active Problem List  ? Diagnosis Date Noted  ? Acute bilateral low back pain without sciatica 06/05/2020  ? Acute pain of right shoulder 06/05/2020  ? Asthma due to environmental allergies 12/20/2016  ? 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 Anxiety 04/02/2014  ? ?History reviewed. No pertinent surgical history. ? ?Home Medications   ? ?Prior to Admission medications   ?Medication Sig Start Date End Date Taking? Authorizing Provider  ?valACYclovir (VALTREX) 1000 MG tablet Take 1 tablet (1,000 mg total) by mouth daily. 05/03/21 06/02/21 Yes Lynden Oxford Scales, PA-C  ?acetaminophen (TYLENOL) 500 MG tablet Take 2 tablet By Mouth every six to eight hours 04/15/21     ?bisacodyl (DULCOLAX) 5 MG EC tablet Take 1 tablet (5 mg total) by mouth daily as needed for up to 15 doses for moderate constipation. 04/05/21   Wyvonnia Dusky, MD  ?cetirizine (ZYRTEC ALLERGY) 10 MG tablet Take 1 tablet (10 mg total) by mouth daily. 11/13/19 02/11/20  Gildardo Pounds, NP  ?chlorhexidine (PERIDEX) 0.12 % solution Swish 15 ml by mouth twice a day. Spit, do not swallow. 04/15/21     ?diclofenac Sodium (VOLTAREN) 1 % GEL Apply 2 g topically 4 (four) times daily. 10/16/20   Argentina Donovan, PA-C  ?docusate sodium (COLACE) 100 MG capsule Take 1 capsule (100 mg total) by mouth every 12 (twelve) hours. 1 pill daily to induce regular bowel movement, as  needed. Can increase to 2 pills daily. 11/11/20   Hazel Sams, PA-C  ?doxycycline (VIBRA-TABS) 100 MG tablet Take 1 tablet (100 mg total) by mouth 2 (two) times daily. 04/08/21   Fransico Meadow, PA-C  ?glycerin adult 2 g suppository Place 1 suppository rectally as needed for up to 25 doses for constipation. 04/05/21   Wyvonnia Dusky, MD  ?hydrocortisone (ANUSOL-HC) 2.5 % rectal cream place 1 application rectally twice daily 11/11/20   Hazel Sams, PA-C  ?hydrocortisone 2.5 % cream Apply rectally twice daily 11/04/20   Chase Picket, MD  ?Lidocaine, Anorectal, 5 % CREA Apply topically as needed 04/06/21   Gildardo Pounds, NP  ?meloxicam (MOBIC) 15 MG tablet Take 1 tablet (15 mg total) by mouth daily. 10/15/20   Meccariello, Bernita Raisin, DO  ?mupirocin ointment (BACTROBAN) 2 % Apply 1 application topically 2 (two) times daily. 10/16/20   Argentina Donovan, PA-C  ?Nitroglycerin 0.4 % OINT Place 1 Dose rectally 2 (two) times daily as needed. Apply pea-sized amount to suppository and insert into rectum. Wash hands thoroughly after use. 04/05/21   Wyvonnia Dusky, MD  ?SUMAtriptan (IMITREX) 50 MG tablet Take one for headache.  May repeat in 2 hours.  No more than 2 a day ?Patient not taking: Reported on 02/09/2018 05/27/17 07/26/18  Raylene Everts, MD  ? ? ?Family History ?Family History  ?  Problem Relation Age of Onset  ? Cancer Father   ? ?Social History ?Social History  ? ?Tobacco Use  ? Smoking status: Former  ? Smokeless tobacco: Never  ?Vaping Use  ? Vaping Use: Never used  ?Substance Use Topics  ? Alcohol use: No  ? Drug use: No  ? ?Allergies   ?Cyclobenzaprine, Methocarbamol, Amoxicillin, and Penicillins ? ?Review of Systems ?Review of Systems ?Pertinent findings noted in history of present illness.  ? ?Physical Exam ?Triage Vital Signs ?ED Triage Vitals  ?Enc Vitals Group  ?   BP 11/14/20 0827 (!) 147/82  ?   Pulse Rate 11/14/20 0827 72  ?   Resp 11/14/20 0827 18  ?   Temp 11/14/20 0827 98.3 ?F (36.8 ?C)   ?   Temp Source 11/14/20 0827 Oral  ?   SpO2 11/14/20 0827 98 %  ?   Weight --   ?   Height --   ?   Head Circumference --   ?   Peak Flow --   ?   Pain Score 11/14/20 0826 5  ?   Pain Loc --   ?   Pain Edu? --   ?   Excl. in [place]? --   ?No data found. ? ?Updated Vital Signs ?BP 106/70   Pulse 82   Temp 99.1 ?F (37.3 ?C)   Resp 18   SpO2 97%  ? ?Physical Exam ?Vitals and nursing note reviewed.  ?Constitutional:   ?   General: He is not in acute distress. ?   Appearance: Normal appearance. He is not ill-appearing.  ?HENT:  ?   Head: Normocephalic and atraumatic.  ?   Salivary Glands: Right salivary gland is not diffusely enlarged or tender. Left salivary gland is not diffusely enlarged or tender.  ?   Right Ear: Tympanic membrane, ear canal and external ear normal. No drainage. No middle ear effusion. There is no impacted cerumen. Tympanic membrane is not erythematous or bulging.  ?   Left Ear: Tympanic membrane, ear canal and external ear normal. No drainage.  No middle ear effusion. There is no impacted cerumen. Tympanic membrane is not erythematous or bulging.  ?   Nose: Nose normal. No nasal deformity, septal deviation, mucosal edema, congestion or rhinorrhea.  ?   Right Turbinates: Not enlarged, swollen or pale.  ?   Left Turbinates: Not enlarged, swollen or pale.  ?   Right Sinus: No maxillary sinus tenderness or frontal sinus tenderness.  ?   Left Sinus: No maxillary sinus tenderness or frontal sinus tenderness.  ?   Mouth/Throat:  ?   Lips: Pink. No lesions.  ?   Mouth: Mucous membranes are moist. No oral lesions.  ?   Tongue: Lesions present.  ?   Pharynx: Oropharynx is clear. Uvula midline. No posterior oropharyngeal erythema or uvula swelling.  ?   Tonsils: No tonsillar exudate. 0 on the right. 0 on the left.  ? ?Eyes:  ?   General: Lids are normal.     ?   Right eye: No discharge.     ?   Left eye: No discharge.  ?   Extraocular Movements: Extraocular movements intact.  ?   Conjunctiva/sclera:  Conjunctivae normal.  ?   Right eye: Right conjunctiva is not injected.  ?   Left eye: Left conjunctiva is not injected.  ?Neck:  ?   Trachea: Trachea and phonation normal.  ?Cardiovascular:  ?   Rate and Rhythm: Normal rate and  regular rhythm.  ?   Pulses: Normal pulses.  ?   Heart sounds: Normal heart sounds. No murmur heard. ?  No friction rub. No gallop.  ?Pulmonary:  ?   Effort: Pulmonary effort is normal. No accessory muscle usage, prolonged expiration or respiratory distress.  ?   Breath sounds: Normal breath sounds. No stridor, decreased air movement or transmitted upper airway sounds. No decreased breath sounds, wheezing, rhonchi or rales.  ?Chest:  ?   Chest wall: No tenderness.  ?Musculoskeletal:     ?   General: Normal range of motion.  ?   Cervical back: Normal range of motion and neck supple. Normal range of motion.  ?Lymphadenopathy:  ?   Cervical: No cervical adenopathy.  ?Skin: ?   General: Skin is warm and dry.  ?   Findings: No erythema or rash.  ?Neurological:  ?   General: No focal deficit present.  ?   Mental Status: He is alert and oriented to person, place, and time.  ?Psychiatric:     ?   Mood and Affect: Mood normal.     ?   Behavior: Behavior normal.  ? ? ?Visual Acuity ?Right Eye Distance:   ?Left Eye Distance:   ?Bilateral Distance:   ? ?Right Eye Near:   ?Left Eye Near:    ?Bilateral Near:    ? ?UC Couse / Diagnostics / Procedures:  ?  ?EKG ? ?Radiology ?No results found. ? ?Procedures ?Procedures (including critical care time) ? ?UC Diagnoses / Final Clinical Impressions(s)   ?I have reviewed the triage vital signs and the nursing notes. ? ?Pertinent labs & imaging results that were available during my care of the patient were reviewed by me and considered in my medical decision making (see chart for details).   ? ?Final diagnoses:  ?HSV (herpes simplex virus) infection  ?Medication refill  ? ?Valacyclovir refilled for patient for 10-day course with refills. ? ?ED Prescriptions   ? ?  Medication Sig Dispense Auth. Provider  ? valACYclovir (VALTREX) 1000 MG tablet  (Status: Discontinued) Take 1 tablet (1,000 mg total) by mouth daily. 10 tablet Lynden Oxford Scales, PA-C  ? valACYclovir (VALTREX)

## 2021-05-03 NOTE — Discharge Instructions (Signed)
I have refilled valacyclovir for at your request.  Please take 1 Tablet daily for 5 days.  Repeat as needed. ?

## 2021-05-03 NOTE — ED Triage Notes (Signed)
Please write hard copy of rx for Pt. ?

## 2021-05-04 ENCOUNTER — Other Ambulatory Visit: Payer: Self-pay

## 2021-05-06 ENCOUNTER — Other Ambulatory Visit: Payer: Self-pay

## 2021-05-11 ENCOUNTER — Other Ambulatory Visit: Payer: Self-pay

## 2021-05-11 ENCOUNTER — Ambulatory Visit (HOSPITAL_COMMUNITY)
Admission: EM | Admit: 2021-05-11 | Discharge: 2021-05-11 | Disposition: A | Discharge status: Home / Self Care | Payer: Self-pay | Attending: Sports Medicine | Admitting: Sports Medicine

## 2021-05-11 ENCOUNTER — Encounter (HOSPITAL_COMMUNITY): Payer: Self-pay | Admitting: Emergency Medicine

## 2021-05-11 DIAGNOSIS — K5900 Constipation, unspecified: Secondary | ICD-10-CM

## 2021-05-11 DIAGNOSIS — H6121 Impacted cerumen, right ear: Secondary | ICD-10-CM

## 2021-05-11 DIAGNOSIS — H9201 Otalgia, right ear: Secondary | ICD-10-CM

## 2021-05-11 DIAGNOSIS — K649 Unspecified hemorrhoids: Secondary | ICD-10-CM

## 2021-05-11 MED ORDER — LIDOCAINE (ANORECTAL) 5 % EX CREA
TOPICAL_CREAM | CUTANEOUS | 1 refills | Status: DC
  Filled 2021-05-11 – 2021-05-25 (×2): qty 30, 5d supply, fill #0

## 2021-05-11 MED ORDER — POLYETHYLENE GLYCOL 3350 17 GM/SCOOP PO POWD
17.0000 g | Freq: Two times a day (BID) | ORAL | 0 refills | Status: DC
  Filled 2021-05-11: qty 238, 7d supply, fill #0

## 2021-05-11 MED ORDER — POLYETHYLENE GLYCOL 3350 17 GM/SCOOP PO POWD: 17.0000 g | Freq: Two times a day (BID) | ORAL | 0 refills | Status: DC

## 2021-05-11 NOTE — ED Triage Notes (Addendum)
Complains of right ear "buzzing".  Patient woke with right ear issues.  Complains of it feeling "blocked" ?

## 2021-05-11 NOTE — Discharge Instructions (Addendum)
Take miralax --> 1 scoop two times a day ?Restart your Docusate sodium (colace) 100mg  --> take 1 pill twice a day  ?*You need to take both of these consistently twice a day for 4-6 weeks to see good changes ? ?Look at the handout I gave you about foods to avoid and foods to eat to help with constipation ? ?I did refill your rectal cream ?

## 2021-05-11 NOTE — ED Provider Notes (Signed)
?[facility] ? ? ? ?CSN: [id number] ?Arrival date & time: 05/11/21  0807 ? ? ?  ? ?History   ?Chief Complaint ?Chief Complaint  ?Patient presents with  ? Ear Fullness  ? ? ?HPI ?Kip Cropp is a 51 y.o. male here for right ear fullness. ? ? ?Ear Fullness ?Pertinent negatives include no chest pain and no shortness of breath.  ? ?Ipad interpreter used throughout entirety of visit.  ? ?Patient reporting woke up this morning with the right ear feeling "clogged and blocked." Heard slight buzzing sound. ?Denies any ear pain, no redness ?Denies any fever or chills ?Otherwise, denies any URI symptoms.  ? ?Patient has history of hemorrhoids. Feels like this is reaggravated. Some pain, but no significant bleeding of the rectum. ?Has used all of the lidocaine gel and hydrocortisone cream - needs a refill ?He does continue with constipation - was taking colace. Once a week but then felt it didn't work so stopped.  ?Denies any recent weight loss. ? ?Past Medical History:  ?Diagnosis Date  ? Asthma   ? Herpes   ? Shingles   ? ? ?Patient Active Problem List  ? Diagnosis Date Noted  ? Acute bilateral low back pain without sciatica 06/05/2020  ? Acute pain of right shoulder 06/05/2020  ? Asthma due to environmental allergies 12/20/2016  ? 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 Anxiety 04/02/2014  ? ? ?Past Surgical History:  ?Procedure Laterality Date  ? MOUTH SURGERY    ? ? ? ? ? ?Home Medications   ? ?Prior to Admission medications   ?Medication Sig Start Date End Date Taking? Authorizing Provider  ?acetaminophen (TYLENOL) 500 MG tablet Take 2 tablet By Mouth every six to eight hours 04/15/21     ?bisacodyl (DULCOLAX) 5 MG EC tablet Take 1 tablet (5 mg total) by mouth daily as needed for up to 15 doses for moderate constipation. 04/05/21   Terald Sleeper, MD  ?cetirizine (ZYRTEC ALLERGY) 10 MG tablet Take 1 tablet (10 mg total) by mouth daily. ?Patient not taking: Reported on 05/11/2021 11/13/19  02/11/20  Claiborne Rigg, NP  ?chlorhexidine (PERIDEX) 0.12 % solution Swish 15 ml by mouth twice a day. Spit, do not swallow. ?Patient not taking: Reported on 05/11/2021 04/15/21     ?diclofenac Sodium (VOLTAREN) 1 % GEL Apply 2 g topically 4 (four) times daily. 10/16/20   Anders Simmonds, PA-C  ?docusate sodium (COLACE) 100 MG capsule Take 1 capsule (100 mg total) by mouth every 12 (twelve) hours. 1 pill daily to induce regular bowel movement, as needed. Can increase to 2 pills daily. 11/11/20   Rhys Martini, PA-C  ?doxycycline (VIBRA-TABS) 100 MG tablet Take 1 tablet (100 mg total) by mouth 2 (two) times daily. ?Patient not taking: Reported on 05/11/2021 04/08/21   Elson Areas, PA-C  ?glycerin adult 2 g suppository Place 1 suppository rectally as needed for up to 25 doses for constipation. 04/05/21   Terald Sleeper, MD  ?hydrocortisone (ANUSOL-HC) 2.5 % rectal cream place 1 application rectally twice daily 11/11/20   Rhys Martini, PA-C  ?hydrocortisone 2.5 % cream Apply rectally twice daily 11/04/20   Merrilee Jansky, MD  ?Lidocaine, Anorectal, 5 % CREA Apply topically as needed 05/11/21   Madelyn Brunner, DO  ?meloxicam (MOBIC) 15 MG tablet Take 1 tablet (15 mg total) by mouth daily. ?Patient not taking: Reported on 05/11/2021 10/15/20   Meccariello, Solmon Ice, DO  ?mupirocin ointment [place])  2 % Apply 1 application topically 2 (two) times daily. 10/16/20   Anders Simmonds, PA-C  ?Nitroglycerin 0.4 % OINT Place 1 Dose rectally 2 (two) times daily as needed. Apply pea-sized amount to suppository and insert into rectum. Wash hands thoroughly after use. 04/05/21   Terald Sleeper, MD  ?polyethylene glycol powder (MIRALAX) 17 GM/SCOOP powder Take 17 grams by mouth 2 (two) times daily. 05/11/21   Madelyn Brunner, DO  ?valACYclovir (VALTREX) 1000 MG tablet Take 1 tablet (1,000 mg total) by mouth daily. 05/03/21 06/02/21  Theadora Rama Scales, PA-C  ?SUMAtriptan (IMITREX) 50 MG tablet Take one for headache.  May  repeat in 2 hours.  No more than 2 a day ?Patient not taking: Reported on 02/09/2018 05/27/17 07/26/18  Eustace Moore, MD  ? ? ?Family History ?Family History  ?Problem Relation Age of Onset  ? Cancer Father   ? ? ?Social History ?Social History  ? ?Tobacco Use  ? Smoking status: Former  ? Smokeless tobacco: Never  ?Vaping Use  ? Vaping Use: Never used  ?Substance Use Topics  ? Alcohol use: No  ? Drug use: No  ? ? ? ?Allergies   ?Cyclobenzaprine, Ibuprofen, Methocarbamol, Amoxicillin, and Penicillins ? ? ?Review of Systems ?Review of Systems  ?Constitutional:  Positive for activity change. Negative for chills and fever.  ?HENT:  Negative for congestion and sore throat.   ?     + right ear fullness, muffled hearing  ?Respiratory:  Negative for shortness of breath.   ?Cardiovascular:  Negative for chest pain.  ?Gastrointestinal:  Positive for constipation and rectal pain. Negative for blood in stool.  ?Neurological:  Negative for dizziness.  ? ? ?Physical Exam ?Triage Vital Signs ?ED Triage Vitals  ?Enc Vitals Group  ?   BP 05/11/21 0828 101/66  ?   Pulse Rate 05/11/21 0828 72  ?   Resp 05/11/21 0828 18  ?   Temp 05/11/21 0828 98.7 ?F (37.1 ?C)  ?   Temp Source 05/11/21 0828 Oral  ?   SpO2 05/11/21 0828 97 %  ?   Weight --   ?   Height --   ?   Head Circumference --   ?   Peak Flow --   ?   Pain Score 05/11/21 0832 0  ?   Pain Loc --   ?   Pain Edu? --   ?   Excl. in [place]? --   ? ?No data found. ? ?Updated Vital Signs ?BP 101/66 (BP Location: Right Arm)   Pulse 72   Temp 98.7 ?F (37.1 ?C) (Oral)   Resp 18   SpO2 97%  ? ?Physical Exam ?Constitutional:   ?   General: He is not in acute distress. ?   Appearance: He is not ill-appearing or toxic-appearing.  ?HENT:  ?   Head: Normocephalic and atraumatic.  ?   Right Ear: There is impacted cerumen.  ?   Ears:  ?   Comments: + significant ceruminosis, unable to see into ear canal ? ?Following ear lavage, completely patent external ear canal, TM visualized without bulging  or signs of infection. ?   Nose: Nose normal. No congestion.  ?   Mouth/Throat:  ?   Mouth: Mucous membranes are moist.  ?Eyes:  ?   Extraocular Movements: Extraocular movements intact.  ?   Pupils: Pupils are equal, round, and reactive to light.  ?Cardiovascular:  ?   Rate and Rhythm: Normal rate.  ?Pulmonary:  ?  Effort: Pulmonary effort is normal. No respiratory distress.  ?Abdominal:  ?   Palpations: Abdomen is soft.  ?   Tenderness: There is no guarding or rebound.  ?Skin: ?   General: Skin is warm.  ?   Capillary Refill: Capillary refill takes less than 2 seconds.  ?Neurological:  ?   General: No focal deficit present.  ?   Mental Status: He is alert.  ?Psychiatric:     ?   Mood and Affect: Mood normal.     ?   Thought Content: Thought content normal.  ? ? ? ?UC Treatments / Results  ?Labs ?(all labs ordered are listed, but only abnormal results are displayed) ?Labs Reviewed - No data to display ? ?EKG ? ? ?Radiology ?No results found. ? ?Procedures ?Procedures (including critical care time) ? ?Medications Ordered in UC ?Medications - No data to display ? ?Initial Impression / Assessment and Plan / UC Course  ?I have reviewed the triage vital signs and the nursing notes. ? ?Pertinent labs & imaging results that were available during my care of the patient were reviewed by me and considered in my medical decision making (see chart for details). ? ?  ? ?Patient has significant cerumen gnosis of the right ear with muffled hearing.  This was 100% resolved after earwax removal with irrigation.  In terms of the patient's constipation and hemorrhoids, he has been treated for this in the urgent care although only has undergone a few days or so of treatment that did not resolve.  We did discuss the importance of dietary changes and I provided a handout in Spanish for him for foods that help with constipation.  He has no red flag symptoms for his hemorrhoids, I did review the fill his anorectal lidocaine cream.   Printed out prescription for MiraLAX, he is to take this twice daily with his previous prescription of Colace 100 mg twice daily.  Discussed the importance of resuming this medication and continuing both twice daily

## 2021-05-12 ENCOUNTER — Other Ambulatory Visit: Payer: Self-pay

## 2021-05-19 ENCOUNTER — Other Ambulatory Visit: Payer: Self-pay

## 2021-05-24 ENCOUNTER — Encounter (HOSPITAL_COMMUNITY): Payer: Self-pay

## 2021-05-24 ENCOUNTER — Ambulatory Visit (HOSPITAL_COMMUNITY)
Admission: EM | Admit: 2021-05-24 | Discharge: 2021-05-24 | Disposition: A | Discharge status: Home / Self Care | Payer: Self-pay | Attending: Emergency Medicine | Admitting: Emergency Medicine

## 2021-05-24 DIAGNOSIS — K05219 Aggressive periodontitis, localized, unspecified severity: Secondary | ICD-10-CM

## 2021-05-24 DIAGNOSIS — Z9289 Personal history of other medical treatment: Secondary | ICD-10-CM

## 2021-05-24 DIAGNOSIS — K5904 Chronic idiopathic constipation: Secondary | ICD-10-CM

## 2021-05-24 MED ORDER — CLINDAMYCIN HCL 150 MG PO CAPS
300.0000 mg | ORAL_CAPSULE | Freq: Four times a day (QID) | ORAL | 0 refills | Status: AC
  Filled 2021-05-25: qty 56, 7d supply, fill #0

## 2021-05-24 MED ORDER — POLYETHYLENE GLYCOL 3350 17 GM/SCOOP PO POWD
17.0000 g | Freq: Two times a day (BID) | ORAL | 2 refills | Status: DC
  Filled 2021-05-25: qty 1020, 30d supply, fill #0
  Filled 2021-07-02: qty 1020, 30d supply, fill #1

## 2021-05-24 NOTE — Discharge Instructions (Addendum)
To treat the infection milligrams, please begin clindamycin.  Take 2 capsules 4 times daily for the next 7 days. ? ?I have renewed your prescription for MiraLAX 2 scoops daily and provided you with refills as well. ? ?As we discussed, when the wound on your left hand starts to get red and inflamed, before you attempt to open it and clean it, make sure a medical provider sees it first. ? ?Thank you for visiting urgent care today. ?

## 2021-05-24 NOTE — ED Provider Notes (Addendum)
?[facility] ? ? ? ?CSN: [id number] ?Arrival date & time: 05/24/21  1014 ?  ? ?HISTORY  ? ?Chief Complaint  ?Patient presents with  ? Dental Pain  ?  3 week ago dental surgery. Requesting his mouth be checked for infection.  ? ?HPI ?Gabriel Ball is a 51 y.o. male. Patient presents to urgent care today requesting refill of MiraLAX that he was provided by another urgent care provider a few weeks ago.  Patient states he had dental surgery 3 weeks ago and was unaware that the surgery was in preparation for bone implants.  Patient states he is having a lot of pain in the area where the surgery was performed.  Patient states he has an appointment with a different dentist in 2 days.  Patient is requesting evaluation of the wound to make sure is not infected.  Patient states he is not having pain in the area at this time.  Patient reports a history of a wound on the back of his left hand near his middle knuckle that has been present for about 3 weeks and varies from healing to reopening.  Patient states that every time he feels there is pus developing behind the wound he opens it drains it and cleans it with bubbling water.  Patient states at this time the wound is not red or draining pus. ? ?The history is provided by the patient. A language interpreter was used.  ?Past Medical History:  ?Diagnosis Date  ? Asthma   ? Herpes   ? Shingles   ? ?Patient Active Problem List  ? Diagnosis Date Noted  ? Acute bilateral low back pain without sciatica 06/05/2020  ? Acute pain of right shoulder 06/05/2020  ? Asthma due to environmental allergies 12/20/2016  ? 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 Anxiety 04/02/2014  ? ?Past Surgical History:  ?Procedure Laterality Date  ? MOUTH SURGERY    ? ? ?Home Medications   ? ?Prior to Admission medications   ?Medication Sig Start Date End Date Taking? Authorizing Provider  ?polyethylene glycol powder (MIRALAX) 17 GM/SCOOP powder Take 17 grams by mouth 2 (two)  times daily. 05/11/21  Yes Madelyn BrunnerBrooks, Dana, DO  ?acetaminophen (TYLENOL) 500 MG tablet Take 2 tablet By Mouth every six to eight hours 04/15/21     ?diclofenac Sodium (VOLTAREN) 1 % GEL Apply 2 g topically 4 (four) times daily. 10/16/20   Anders SimmondsMcClung, Angela M, PA-C  ?glycerin adult 2 g suppository Place 1 suppository rectally as needed for up to 25 doses for constipation. 04/05/21   Terald Sleeperrifan, Matthew J, MD  ?hydrocortisone (ANUSOL-HC) 2.5 % rectal cream place 1 application rectally twice daily 11/11/20   Rhys MartiniGraham, Laura E, PA-C  ?hydrocortisone 2.5 % cream Apply rectally twice daily 11/04/20   Merrilee JanskyLamptey, Philip O, MD  ?Lidocaine, Anorectal, 5 % CREA Apply topically as needed 05/11/21   Madelyn BrunnerBrooks, Dana, DO  ?mupirocin ointment (BACTROBAN) 2 % Apply 1 application topically 2 (two) times daily. 10/16/20   Anders SimmondsMcClung, Angela M, PA-C  ?Nitroglycerin 0.4 % OINT Place 1 Dose rectally 2 (two) times daily as needed. Apply pea-sized amount to suppository and insert into rectum. Wash hands thoroughly after use. 04/05/21   Terald Sleeperrifan, Matthew J, MD  ?valACYclovir (VALTREX) 1000 MG tablet Take 1 tablet (1,000 mg total) by mouth daily. 05/03/21 06/02/21  Theadora RamaMorgan, Keon Benscoter Scales, PA-C  ?SUMAtriptan (IMITREX) 50 MG tablet Take one for headache.  May repeat in 2 hours.  No more than 2 a day ?  Patient not taking: Reported on 02/09/2018 05/27/17 07/26/18  Eustace Moore, MD  ? ? ?Family History ?Family History  ?Problem Relation Age of Onset  ? Cancer Father   ? ?Social History ?Social History  ? ?Tobacco Use  ? Smoking status: Former  ? Smokeless tobacco: Never  ?Vaping Use  ? Vaping Use: Never used  ?Substance Use Topics  ? Alcohol use: No  ? Drug use: No  ? ?Allergies   ?Cyclobenzaprine, Ibuprofen, Methocarbamol, Amoxicillin, and Penicillins ? ?Review of Systems ?Review of Systems ?Pertinent findings noted in history of present illness.  ? ?Physical Exam ?Triage Vital Signs ?ED Triage Vitals  ?Enc Vitals Group  ?   BP 11/14/20 0827 (!) 147/82  ?   Pulse Rate  11/14/20 0827 72  ?   Resp 11/14/20 0827 18  ?   Temp 11/14/20 0827 98.3 ?F (36.8 ?C)  ?   Temp Source 11/14/20 0827 Oral  ?   SpO2 11/14/20 0827 98 %  ?   Weight --   ?   Height --   ?   Head Circumference --   ?   Peak Flow --   ?   Pain Score 11/14/20 0826 5  ?   Pain Loc --   ?   Pain Edu? --   ?   Excl. in GC? --   ?No data found. ? ?Updated Vital Signs ?BP 111/68 (BP Location: Left Arm)   Pulse 71   Temp 98.7 ?F (37.1 ?C) (Oral)   Resp 18   SpO2 99%  ? ?Physical Exam ?Constitutional:   ?   General: He is not in acute distress. ?   Appearance: He is not ill-appearing or toxic-appearing.  ?HENT:  ?   Head: Normocephalic and atraumatic.  ?   Right Ear: Tympanic membrane, ear canal and external ear normal.  ?   Left Ear: Tympanic membrane, ear canal and external ear normal.  ?   Nose: Nose normal. No congestion.  ?   Mouth/Throat:  ?   Mouth: Mucous membranes are moist.  ?   Dentition: Gingival swelling (Findings concerning for abscess right lateral aspect of upper gingiva where dental surgery previously performed.) present.  ?Eyes:  ?   Extraocular Movements: Extraocular movements intact.  ?   Pupils: Pupils are equal, round, and reactive to light.  ?Cardiovascular:  ?   Rate and Rhythm: Normal rate and regular rhythm.  ?   Heart sounds: Normal heart sounds, S1 normal and S2 normal. Heart sounds not distant. No murmur heard. ?Pulmonary:  ?   Effort: Pulmonary effort is normal. No respiratory distress.  ?Abdominal:  ?   General: Abdomen is flat. Bowel sounds are decreased.  ?   Palpations: Abdomen is soft. There is no hepatomegaly, splenomegaly, mass or pulsatile mass.  ?   Tenderness: There is no abdominal tenderness. There is no right CVA tenderness, left CVA tenderness, guarding or rebound. Negative signs include Murphy's sign, Rovsing's sign, McBurney's sign, psoas sign and obturator sign.  ?   Hernia: No hernia is present.  ?Musculoskeletal:  ?   Cervical back: Full passive range of motion without pain.   ?Skin: ?   General: Skin is warm.  ?   Capillary Refill: Capillary refill takes less than 2 seconds.  ?   Comments: 4 mm healing wound at the distal aspect of the third metacarpal on the left hand without any erythema, drainage, excoriation or induration.  ?Neurological:  ?   General:  No focal deficit present.  ?   Mental Status: He is alert.  ?Psychiatric:     ?   Mood and Affect: Mood normal.     ?   Thought Content: Thought content normal.  ? ? ?Visual Acuity ?Right Eye Distance:   ?Left Eye Distance:   ?Bilateral Distance:   ? ?Right Eye Near:   ?Left Eye Near:    ?Bilateral Near:    ? ?UC Couse / Diagnostics / Procedures:  ?  ?EKG ? ?Radiology ?No results found. ? ?Procedures ?Procedures (including critical care time) ? ?UC Diagnoses / Final Clinical Impressions(s)   ?I have reviewed the triage vital signs and the nursing notes. ? ?Pertinent labs & imaging results that were available during my care of the patient were reviewed by me and considered in my medical decision making (see chart for details).   ? ?Final diagnoses:  ?History of dental surgery  ?Gingival abscess  ?Chronic idiopathic constipation  ? ?Patient provided with printed renewal of MiraLAX powder at his request.  Patient advised that I recommend he take a short course of antibiotics to prevent the wound from dental surgery from developing infection.  Patient encouraged to keep his appointment in 2 weeks.  Patient also encouraged to consider following up with the original dentist who performed the initial procedure.  Patient advised to return for evaluation of the wound on his left hand if it starts to become red or look infected again. ? ?ED Prescriptions   ? ? Medication Sig Dispense Auth. Provider  ? polyethylene glycol powder (GLYCOLAX/MIRALAX) 17 GM/SCOOP powder Take 17 g by mouth in the morning and at bedtime. 1,020 g Theadora Rama Scales, PA-C  ? clindamycin (CLEOCIN) 150 MG capsule Take 2 capsules (300 mg total) by mouth 4 (four) times  daily for 7 days. 56 capsule Theadora Rama Scales, PA-C  ? ?  ? ?PDMP not reviewed this encounter. ? ?Pending results:  ?Labs Reviewed - No data to display ? ?Medications Ordered in UC: ?Medications - No data to

## 2021-05-24 NOTE — ED Triage Notes (Signed)
Pt C/O mouth pain from dental surgery 3 weeks ago. Pt also has a sore on his right hand for 2 weeks. Pt also requesting a rx for Miralax. Pt states the pharmacy wouldn't fill the RX. Advised he could but over the counter. ?

## 2021-05-25 ENCOUNTER — Other Ambulatory Visit: Payer: Self-pay

## 2021-05-25 MED ORDER — ONDANSETRON HCL 4 MG PO TABS
ORAL_TABLET | ORAL | 0 refills | Status: DC
  Filled 2021-05-25: qty 12, 3d supply, fill #0

## 2021-05-29 ENCOUNTER — Other Ambulatory Visit: Payer: Self-pay

## 2021-06-01 ENCOUNTER — Ambulatory Visit (HOSPITAL_COMMUNITY)
Admission: EM | Admit: 2021-06-01 | Discharge: 2021-06-01 | Disposition: A | Discharge status: Home / Self Care | Payer: Self-pay | Attending: Internal Medicine | Admitting: Internal Medicine

## 2021-06-01 ENCOUNTER — Other Ambulatory Visit: Payer: Self-pay

## 2021-06-01 ENCOUNTER — Other Ambulatory Visit: Payer: Self-pay | Admitting: Nurse Practitioner

## 2021-06-01 ENCOUNTER — Encounter (HOSPITAL_COMMUNITY): Payer: Self-pay

## 2021-06-01 DIAGNOSIS — Z20822 Contact with and (suspected) exposure to covid-19: Secondary | ICD-10-CM | POA: Insufficient documentation

## 2021-06-01 DIAGNOSIS — R053 Chronic cough: Secondary | ICD-10-CM

## 2021-06-01 DIAGNOSIS — M545 Low back pain, unspecified: Secondary | ICD-10-CM

## 2021-06-01 DIAGNOSIS — J069 Acute upper respiratory infection, unspecified: Secondary | ICD-10-CM | POA: Insufficient documentation

## 2021-06-01 DIAGNOSIS — J029 Acute pharyngitis, unspecified: Secondary | ICD-10-CM | POA: Insufficient documentation

## 2021-06-01 DIAGNOSIS — R059 Cough, unspecified: Secondary | ICD-10-CM | POA: Insufficient documentation

## 2021-06-01 LAB — POCT RAPID STREP A, ED / UC: Streptococcus, Group A Screen (Direct): NEGATIVE

## 2021-06-01 MED ORDER — BENZONATATE 100 MG PO CAPS
100.0000 mg | ORAL_CAPSULE | Freq: Three times a day (TID) | ORAL | 0 refills | Status: DC | PRN
  Filled 2021-06-01: qty 21, 7d supply, fill #0

## 2021-06-01 MED ORDER — PROMETHAZINE-DM 6.25-15 MG/5ML PO SYRP
5.0000 mL | ORAL_SOLUTION | Freq: Four times a day (QID) | ORAL | 0 refills | Status: DC | PRN
  Filled 2021-06-01: qty 180, 9d supply, fill #0

## 2021-06-01 MED ORDER — FLUTICASONE PROPIONATE 50 MCG/ACT NA SUSP
1.0000 | Freq: Every day | NASAL | 0 refills | Status: DC
  Filled 2021-06-01: qty 16, 25d supply, fill #0

## 2021-06-01 NOTE — Discharge Instructions (Signed)
It appears that you have a viral upper respiratory infection that should run its course and self resolve in the next few days with symptomatic treatment.  You have been prescribed 2 medications to alleviate symptoms.  Rapid strep was negative.  Throat culture and COVID test are pending.  We will call if it is positive.  Please follow-up if symptoms persist or worsen. ?

## 2021-06-01 NOTE — ED Provider Notes (Addendum)
?[facility] ? ? ? ?CSN: [id number] ?Arrival date & time: 06/01/21  0803 ? ? ?  ? ?History   ?Chief Complaint ?Chief Complaint  ?Patient presents with  ? Cough  ? Sore Throat  ? ? ?HPI ?Gabriel NoticeJorge Ball Ball is a 51 y.o. male.  ? ?Patient presents with cough, sore throat, nasal congestion, headache that has been present for approximately 2 days.  He reports that his daughter had similar symptoms recently.  Denies any known fevers at home.  Denies chest pain, shortness of breath, ear pain, nausea, vomiting, diarrhea, abdominal pain.  Patient has taken NyQuil with no improvement in symptoms. ? ? ?Cough ?Sore Throat ? ? ?Past Medical History:  ?Diagnosis Date  ? Asthma   ? Herpes   ? Shingles   ? ? ?Patient Active Problem List  ? Diagnosis Date Noted  ? Acute bilateral low back pain without sciatica 06/05/2020  ? Acute pain of right shoulder 06/05/2020  ? Asthma due to environmental allergies 12/20/2016  ? 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 Anxiety 04/02/2014  ? ? ?Past Surgical History:  ?Procedure Laterality Date  ? MOUTH SURGERY    ? ? ? ? ? ?Home Medications   ? ?Prior to Admission medications   ?Medication Sig Start Date End Date Taking? Authorizing Provider  ?benzonatate (TESSALON) 100 MG capsule Take 1 capsule (100 mg total) by mouth every 8 (eight) hours as needed for cough. 06/01/21  Yes Gustavus BryantMound, Deslyn Cavenaugh E, FNP  ?fluticasone (FLONASE) 50 MCG/ACT nasal spray Place 1 spray into both nostrils daily for 3 days. 06/01/21 06/26/21 Yes Gustavus BryantMound, Aarianna Hoadley E, FNP  ?acetaminophen (TYLENOL) 500 MG tablet Take 2 tablet By Mouth every six to eight hours 04/15/21     ?clindamycin (CLEOCIN) 150 MG capsule Take 2 capsules (300 mg total) by mouth 4 (four) times daily for 7 days. 05/24/21 06/01/21  Theadora RamaMorgan, Lindsay Scales, PA-C  ?diclofenac Sodium (VOLTAREN) 1 % GEL Apply 2 g topically 4 (four) times daily. 10/16/20   Anders SimmondsMcClung, Angela M, PA-C  ?glycerin adult 2 g suppository Place 1 suppository rectally as needed for up to 25  doses for constipation. 04/05/21   Terald Sleeperrifan, Matthew J, MD  ?hydrocortisone (ANUSOL-HC) 2.5 % rectal cream place 1 application rectally twice daily 11/11/20   Rhys MartiniGraham, Laura E, PA-C  ?hydrocortisone 2.5 % cream Apply rectally twice daily 11/04/20   Merrilee JanskyLamptey, Philip O, MD  ?Lidocaine, Anorectal, 5 % CREA Apply topically as needed 05/11/21   Madelyn BrunnerBrooks, Dana, DO  ?mupirocin ointment (BACTROBAN) 2 % Apply 1 application topically 2 (two) times daily. 10/16/20   Anders SimmondsMcClung, Angela M, PA-C  ?Nitroglycerin 0.4 % OINT Place 1 Dose rectally 2 (two) times daily as needed. Apply pea-sized amount to suppository and insert into rectum. Wash hands thoroughly after use. 04/05/21   Terald Sleeperrifan, Matthew J, MD  ?polyethylene glycol powder (GLYCOLAX/MIRALAX) 17 GM/SCOOP powder Take 17 grams by mouth in the morning and at bedtime. 05/24/21 08/22/21  Theadora RamaMorgan, Lindsay Scales, PA-C  ?valACYclovir (VALTREX) 1000 MG tablet Take 1 tablet (1,000 mg total) by mouth daily. 05/03/21 06/02/21  Theadora RamaMorgan, Lindsay Scales, PA-C  ?SUMAtriptan (IMITREX) 50 MG tablet Take one for headache.  May repeat in 2 hours.  No more than 2 a day ?Patient not taking: Reported on 02/09/2018 05/27/17 07/26/18  Eustace MooreNelson, Yvonne Sue, MD  ? ? ?Family History ?Family History  ?Problem Relation Age of Onset  ? Cancer Father   ? ? ?Social History ?Social History  ? ?Tobacco Use  ?  Smoking status: Former  ? Smokeless tobacco: Never  ?Vaping Use  ? Vaping Use: Never used  ?Substance Use Topics  ? Alcohol use: No  ? Drug use: No  ? ? ? ?Allergies   ?Cyclobenzaprine, Ibuprofen, Methocarbamol, Amoxicillin, and Penicillins ? ? ?Review of Systems ?Review of Systems ?Per HPI ? ?Physical Exam ?Triage Vital Signs ?ED Triage Vitals  ?Enc Vitals Group  ?   BP 06/01/21 0829 105/63  ?   Pulse Rate 06/01/21 0829 82  ?   Resp 06/01/21 0829 18  ?   Temp 06/01/21 0829 98.6 ?F (37 ?C)  ?   Temp Source 06/01/21 0829 Oral  ?   SpO2 06/01/21 0829 100 %  ?   Weight --   ?   Height --   ?   Head Circumference --   ?   Peak Flow --    ?   Pain Score 06/01/21 0831 5  ?   Pain Loc --   ?   Pain Edu? --   ?   Excl. in GC? --   ? ?No data found. ? ?Updated Vital Signs ?BP 105/63 (BP Location: Left Arm)   Pulse 82   Temp 98.6 ?F (37 ?C) (Oral)   Resp 18   SpO2 100%  ? ?Visual Acuity ?Right Eye Distance:   ?Left Eye Distance:   ?Bilateral Distance:   ? ?Right Eye Near:   ?Left Eye Near:    ?Bilateral Near:    ? ?Physical Exam ?Constitutional:   ?   General: He is not in acute distress. ?   Appearance: Normal appearance. He is not toxic-appearing or diaphoretic.  ?HENT:  ?   Head: Normocephalic and atraumatic.  ?   Right Ear: Tympanic membrane and ear canal normal.  ?   Left Ear: Tympanic membrane and ear canal normal.  ?   Nose: Congestion present.  ?   Mouth/Throat:  ?   Mouth: Mucous membranes are moist.  ?   Pharynx: Posterior oropharyngeal erythema present.  ?Eyes:  ?   Extraocular Movements: Extraocular movements intact.  ?   Conjunctiva/sclera: Conjunctivae normal.  ?   Pupils: Pupils are equal, round, and reactive to light.  ?Cardiovascular:  ?   Rate and Rhythm: Normal rate and regular rhythm.  ?   Pulses: Normal pulses.  ?   Heart sounds: Normal heart sounds.  ?Pulmonary:  ?   Effort: Pulmonary effort is normal. No respiratory distress.  ?   Breath sounds: Normal breath sounds. No stridor. No wheezing, rhonchi or rales.  ?Abdominal:  ?   General: Abdomen is flat. Bowel sounds are normal.  ?   Palpations: Abdomen is soft.  ?Musculoskeletal:     ?   General: Normal range of motion.  ?   Cervical back: Normal range of motion.  ?Skin: ?   General: Skin is warm and dry.  ?Neurological:  ?   General: No focal deficit present.  ?   Mental Status: He is alert and oriented to person, place, and time. Mental status is at baseline.  ?Psychiatric:     ?   Mood and Affect: Mood normal.     ?   Behavior: Behavior normal.  ? ? ? ?UC Treatments / Results  ?Labs ?(all labs ordered are listed, but only abnormal results are displayed) ?Labs Reviewed   ?CULTURE, GROUP A STREP [facility])  ?SARS CORONAVIRUS 2 (TAT 6-24 HRS)  ?POCT RAPID STREP A, ED / UC  ? ? ?  EKG ? ? ?Radiology ?No results found. ? ?Procedures ?Procedures (including critical care time) ? ?Medications Ordered in UC ?Medications - No data to display ? ?Initial Impression / Assessment and Plan / UC Course  ?I have reviewed the triage vital signs and the nursing notes. ? ?Pertinent labs & imaging results that were available during my care of the patient were reviewed by me and considered in my medical decision making (see chart for details). ? ?  ? ?Patient presents with symptoms likely from a viral upper respiratory infection. Differential includes bacterial pneumonia, sinusitis, allergic rhinitis, COVID-19, flu. Do not suspect underlying cardiopulmonary process. Symptoms seem unlikely related to ACS, CHF or COPD exacerbations, pneumonia, pneumothorax. Patient is nontoxic appearing and not in need of emergent medical intervention.  Rapid strep was negative.  Throat culture and COVID test pending. ? ?Recommended symptom control with over the counter medications: Daily oral anti-histamine, Oral decongestant or IN corticosteroid, saline irrigations, cepacol lozenges, Robitussin, Delsym, honey tea.  Patient sent prescriptions. ? ?Return if symptoms fail to improve in 1-2 weeks or you develop shortness of breath, chest pain, severe headache. Patient states understanding and is agreeable. ? ?Discharged with PCP followup.  Interpreter used throughout patient interaction. ?Final Clinical Impressions(s) / UC Diagnoses  ? ?Final diagnoses:  ?Viral URI with cough  ?Sore throat  ? ? ? ?Discharge Instructions   ? ?  ?It appears that you have a viral upper respiratory infection that should run its course and self resolve in the next few days with symptomatic treatment.  You have been prescribed 2 medications to alleviate symptoms.  Rapid strep was negative.  Throat culture and COVID test are pending.  We will call if  it is positive.  Please follow-up if symptoms persist or worsen. ? ? ? ?ED Prescriptions   ? ? Medication Sig Dispense Auth. Provider  ? benzonatate (TESSALON) 100 MG capsule Take 1 capsule (100 mg total) by m

## 2021-06-01 NOTE — ED Triage Notes (Signed)
Pt c/o cough, sore throat, nasal congestion, and a headache x2 days. States taking Nyquil with no relief.  ?

## 2021-06-01 NOTE — Progress Notes (Signed)
[place]

## 2021-06-02 LAB — SARS CORONAVIRUS 2 (TAT 6-24 HRS): SARS Coronavirus 2: NEGATIVE

## 2021-06-04 LAB — CULTURE, GROUP A STREP (THRC)

## 2021-07-02 ENCOUNTER — Other Ambulatory Visit: Payer: Self-pay

## 2021-07-07 ENCOUNTER — Ambulatory Visit
Admission: RE | Admit: 2021-07-07 | Discharge: 2021-07-07 | Disposition: A | Discharge status: Home / Self Care | Payer: Self-pay | Source: Ambulatory Visit | Attending: Nurse Practitioner | Admitting: Nurse Practitioner

## 2021-07-07 ENCOUNTER — Other Ambulatory Visit: Payer: Self-pay | Admitting: Nurse Practitioner

## 2021-07-07 DIAGNOSIS — W19XXXA Unspecified fall, initial encounter: Secondary | ICD-10-CM

## 2021-07-14 ENCOUNTER — Encounter (HOSPITAL_COMMUNITY): Payer: Self-pay

## 2021-07-14 ENCOUNTER — Ambulatory Visit (INDEPENDENT_AMBULATORY_CARE_PROVIDER_SITE_OTHER): Payer: Self-pay

## 2021-07-14 ENCOUNTER — Ambulatory Visit (HOSPITAL_COMMUNITY)
Admission: EM | Admit: 2021-07-14 | Discharge: 2021-07-14 | Disposition: A | Discharge status: Home / Self Care | Payer: Self-pay | Attending: Student | Admitting: Student

## 2021-07-14 ENCOUNTER — Ambulatory Visit: Payer: Self-pay | Attending: Nurse Practitioner | Admitting: Nurse Practitioner

## 2021-07-14 ENCOUNTER — Ambulatory Visit (HOSPITAL_COMMUNITY): Payer: Self-pay

## 2021-07-14 DIAGNOSIS — M25511 Pain in right shoulder: Secondary | ICD-10-CM

## 2021-07-14 DIAGNOSIS — W19XXXA Unspecified fall, initial encounter: Secondary | ICD-10-CM

## 2021-07-14 DIAGNOSIS — R0782 Intercostal pain: Secondary | ICD-10-CM

## 2021-07-14 DIAGNOSIS — W11XXXA Fall on and from ladder, initial encounter: Secondary | ICD-10-CM

## 2021-07-14 DIAGNOSIS — G8929 Other chronic pain: Secondary | ICD-10-CM

## 2021-07-14 DIAGNOSIS — S20211A Contusion of right front wall of thorax, initial encounter: Secondary | ICD-10-CM

## 2021-07-14 NOTE — ED Triage Notes (Signed)
Pt fell x 2 days ago off a ladder. He is c.o headache,left arm pain and hip pain.

## 2021-07-14 NOTE — Progress Notes (Signed)
[name] is currently  in the urgent care. No answer LVM with assistance from Interpreter [phone]

## 2021-07-16 ENCOUNTER — Other Ambulatory Visit: Payer: Self-pay

## 2021-07-16 MED ORDER — VALACYCLOVIR HCL 1 G PO TABS
ORAL_TABLET | ORAL | 0 refills | Status: DC
  Filled 2021-07-16: qty 10, 10d supply, fill #0

## 2021-07-21 ENCOUNTER — Encounter (HOSPITAL_COMMUNITY): Payer: Self-pay

## 2021-07-21 ENCOUNTER — Other Ambulatory Visit: Payer: Self-pay

## 2021-07-21 ENCOUNTER — Emergency Department (HOSPITAL_COMMUNITY): Payer: Self-pay

## 2021-07-21 ENCOUNTER — Emergency Department (HOSPITAL_COMMUNITY)
Admission: EM | Admit: 2021-07-21 | Discharge: 2021-07-21 | Disposition: A | Discharge status: Home / Self Care | Payer: Self-pay | Attending: Emergency Medicine | Admitting: Emergency Medicine

## 2021-07-21 DIAGNOSIS — M5126 Other intervertebral disc displacement, lumbar region: Secondary | ICD-10-CM

## 2021-07-21 DIAGNOSIS — W19XXXD Unspecified fall, subsequent encounter: Secondary | ICD-10-CM

## 2021-07-21 DIAGNOSIS — W11XXXD Fall on and from ladder, subsequent encounter: Secondary | ICD-10-CM | POA: Insufficient documentation

## 2021-07-21 DIAGNOSIS — M48061 Spinal stenosis, lumbar region without neurogenic claudication: Secondary | ICD-10-CM | POA: Insufficient documentation

## 2021-07-21 DIAGNOSIS — J45909 Unspecified asthma, uncomplicated: Secondary | ICD-10-CM | POA: Insufficient documentation

## 2021-07-21 DIAGNOSIS — M79601 Pain in right arm: Secondary | ICD-10-CM

## 2021-07-21 MED ORDER — LIDOCAINE 4 % EX PTCH
1.0000 | MEDICATED_PATCH | Freq: Two times a day (BID) | CUTANEOUS | 0 refills | Status: DC | PRN
  Filled 2021-07-21: qty 30, 15d supply, fill #0

## 2021-07-21 MED ORDER — PREDNISONE 20 MG PO TABS
40.0000 mg | ORAL_TABLET | Freq: Every day | ORAL | 0 refills | Status: AC
  Filled 2021-07-21: qty 8, 4d supply, fill #0

## 2021-07-21 MED ORDER — PREDNISONE 20 MG PO TABS: 60.0000 mg | ORAL_TABLET | Freq: Once | ORAL | Status: DC

## 2021-07-21 MED ORDER — ACETAMINOPHEN 500 MG PO TABS
1000.0000 mg | ORAL_TABLET | Freq: Once | ORAL | Status: AC
  Administered 2021-07-21: 1000 mg via ORAL
  Filled 2021-07-21: qty 2

## 2021-07-21 MED ORDER — LIDOCAINE 5 % EX PTCH
1.0000 | MEDICATED_PATCH | CUTANEOUS | Status: DC
  Administered 2021-07-21: 1 via TRANSDERMAL
  Filled 2021-07-21: qty 1

## 2021-07-21 NOTE — Discharge Instructions (Signed)
You came to the emergency department today to be evaluated for your injuries after suffering a fall.  The CT scan of your neck did not show any acute abnormalities.  The CT scan of your lumbar back did show disc herniations at L4-L5 and L5-S1.  Due to this I have given you a course of steroids and you will need to follow-up with the neurosurgeon listed on this paperwork.  Please continue to take your diclofenac medication as prescribed.  Additionally you may take Tylenol as indicated below.  Please take Tylenol (acetaminophen) to relieve your pain.  You make take tylenol, up to 1,000 mg (two extra strength pills) every 8 hours as needed.  Do not take more than 3,000 mg tylenol in a 24 hour period (not more than one dose every 8 hours.  Please check all medication labels as many medications such as pain and cold medications may contain tylenol.  Do not drink alcohol while taking these medications.  I have given you a prescription for steroids today.  Some common side effects include feelings of extra energy, feeling warm, increased appetite, and stomach upset.  If you are diabetic your sugars may run higher than usual.   Get help right away if: You cannot move your arms or legs. You cannot control when you pee or poop. You feel dizzy. You faint. You have trouble breathing.

## 2021-07-21 NOTE — ED Provider Notes (Signed)
[facility] EMERGENCY DEPARTMENT Provider Note   CSN: [id number] Arrival date & time: 07/21/21  0931     History  Chief Complaint  Patient presents with   Leg Injury    Gabriel Ball is a 51 y.o. male with a past medical history of asthma, shingles, and herpes.  Complains of multiple injuries after sustaining a fall.  Patient reports that on 07/07/21 while at work he suffered a fall.  Patient reports that he was on a 3 step ladder when he moved to stand on a sand, after standing back on the ladder he lost his balance and fell.  Patient reports that he has been dealing with right arm pain, right rib pain, lumbar back pain the injury.  Patient has been taking diclofenac medication with no relief of symptoms.  Patient states that lumbar back pain radiates into his leg and spreads all the way down to his foot.  Patient reports that pain in his right arm extends from his shoulders to his fingers.  Patient endorses difficulty with shoulder range of motion secondary to complaints of pain.  Patient is not on any blood thinners.  Patient is right-hand dominant.  Denies any numbness, weakness, bowel/bladder dysfunction, saddle anesthesia, headache, visual disturbance, IV drug use, malignancy.  HPI     Home Medications Prior to Admission medications   Medication Sig Start Date End Date Taking? Authorizing Provider  acetaminophen (TYLENOL) 500 MG tablet Take 2 tablet By Mouth every six to eight hours 04/15/21     benzonatate (TESSALON) 100 MG capsule Take 1 capsule (100 mg total) by mouth every 8 (eight) hours as needed for cough. 06/01/21   Gustavus Bryant, FNP  diclofenac Sodium (VOLTAREN) 1 % GEL Apply 2 g topically 4 (four) times daily. 10/16/20   Anders Simmonds, PA-C  fluticasone (FLONASE) 50 MCG/ACT nasal spray Place 1 spray into both nostrils daily for 3 days. 06/01/21 06/26/21  Gustavus Bryant, FNP  glycerin adult 2 g suppository Place 1 suppository rectally as needed  for up to 25 doses for constipation. 04/05/21   Terald Sleeper, MD  hydrocortisone (ANUSOL-HC) 2.5 % rectal cream place 1 application rectally twice daily 11/11/20   Rhys Martini, PA-C  hydrocortisone 2.5 % cream Apply rectally twice daily 11/04/20   Merrilee Jansky, MD  Lidocaine, Anorectal, 5 % CREA Apply topically as needed 05/11/21   Madelyn Brunner, DO  mupirocin ointment (BACTROBAN) 2 % Apply 1 application topically 2 (two) times daily. 10/16/20   Anders Simmonds, PA-C  Nitroglycerin 0.4 % OINT Place 1 Dose rectally 2 (two) times daily as needed. Apply pea-sized amount to suppository and insert into rectum. Wash hands thoroughly after use. 04/05/21   Terald Sleeper, MD  polyethylene glycol powder (GLYCOLAX/MIRALAX) 17 GM/SCOOP powder Take 17 grams by mouth in the morning and at bedtime. 05/24/21 08/22/21  Theadora Rama Scales, PA-C  valACYclovir (VALTREX) 1000 MG tablet take 1 tab by mouth once daily 05/03/21   Theadora Rama Scales, PA-C  SUMAtriptan (IMITREX) 50 MG tablet Take one for headache.  May repeat in 2 hours.  No more than 2 a day Patient not taking: Reported on 02/09/2018 05/27/17 07/26/18  Eustace Moore, MD      Allergies    Cyclobenzaprine, Ibuprofen, Methocarbamol, Amoxicillin, and Penicillins    Review of Systems   Review of Systems  Constitutional:  Negative for chills and fever.  Eyes:  Negative for visual disturbance.  Respiratory:  Negative for shortness of breath.   Cardiovascular:  Negative for chest pain.  Gastrointestinal:  Negative for abdominal pain, nausea and vomiting.  Genitourinary:  Negative for difficulty urinating and dysuria.  Musculoskeletal:  Positive for arthralgias, back pain and myalgias. Negative for neck pain.  Skin:  Negative for color change, pallor, rash and wound.  Neurological:  Negative for dizziness, tremors, seizures, syncope, facial asymmetry, speech difficulty, weakness, light-headedness, numbness and headaches.   Psychiatric/Behavioral:  Negative for confusion.     Physical Exam Updated Vital Signs BP 119/68 (BP Location: Right Arm)   Pulse 79   Temp 98.5 F (36.9 C) (Oral)   Resp 16   Ht 5\' 5"  (1.651 m)   Wt 83.9 kg   SpO2 100%   BMI 30.78 kg/m  Physical Exam Vitals and nursing note reviewed.  Constitutional:      General: He is not in acute distress.    Appearance: He is not ill-appearing, toxic-appearing or diaphoretic.  HENT:     Head: Normocephalic and atraumatic. No raccoon eyes, Battle's sign, abrasion, contusion, right periorbital erythema, left periorbital erythema or laceration.  Eyes:     General: No scleral icterus.       Right eye: No discharge.        Left eye: No discharge.  Cardiovascular:     Rate and Rhythm: Normal rate.     Pulses:          Radial pulses are 2+ on the right side and 2+ on the left side.       Dorsalis pedis pulses are 2+ on the left side.  Pulmonary:     Effort: Pulmonary effort is normal.  Musculoskeletal:     Right shoulder: No swelling, deformity, effusion, laceration, tenderness, bony tenderness or crepitus. Decreased range of motion.     Left shoulder: No swelling, deformity, effusion, laceration, tenderness, bony tenderness or crepitus. Normal range of motion.     Right upper arm: Normal.     Left upper arm: Normal.     Right elbow: Normal.     Left elbow: Normal.     Right forearm: Normal.     Left forearm: Normal.     Right wrist: Normal.     Left wrist: Normal.     Right hand: No swelling, deformity, lacerations, tenderness or bony tenderness. Normal range of motion. Normal strength. Normal sensation. Normal capillary refill.     Left hand: No swelling, deformity, lacerations, tenderness or bony tenderness. Normal range of motion. Normal strength.     Cervical back: No swelling, edema, deformity, erythema, signs of trauma, lacerations, rigidity, spasms, torticollis, tenderness, bony tenderness or crepitus. No pain with movement.  Normal range of motion.     Thoracic back: No swelling, edema, deformity, signs of trauma, lacerations, spasms, tenderness or bony tenderness.     Lumbar back: Tenderness present. No swelling, edema, deformity, signs of trauma, lacerations, spasms or bony tenderness. Normal range of motion.     Comments: No midline tenderness or deformity to cervical, thoracic, or lumbar spine.  Patient has tenderness to left lumbar back.  Decreased range of motion to right shoulder secondary to complaints of pain.  No tenderness, point tenderness, or deformity to bilateral lower extremities.    Skin:    General: Skin is warm and dry.  Neurological:     General: No focal deficit present.     Mental Status: He is alert.     GCS: GCS eye subscore is 4. GCS  verbal subscore is 5. GCS motor subscore is 6.     Comments: +5 strength to bilateral upper and lower extremities.  Sensation to light touch grossly intact to bilateral upper and lower extremities.  Patient able to stand and ambulate without difficulty  Psychiatric:        Behavior: Behavior is cooperative.     ED Results / Procedures / Treatments   Labs (all labs ordered are listed, but only abnormal results are displayed) Labs Reviewed - No data to display  EKG None  Radiology No results found.  Procedures Procedures    Medications Ordered in ED Medications - No data to display  ED Course/ Medical Decision Making/ A&P                           Medical Decision Making Amount and/or Complexity of Data Reviewed Radiology: ordered.  Risk OTC drugs. Prescription drug management.   Alert 51 year old male in no acute distress, nontoxic-appearing.  Presents to the emergency department chief complaint of injuries after sustaining a fall.  Information was obtained from patient.  Spanish interpreter was used to conduct this interview.  I reviewed patient's past medical records including previous provider notes, labs, and imaging.   Patient has medical history as outlined in HPI which complicates his care.  Per chart review patient was seen at urgent care on 07/14/2021.  Patient had reports of upper back, lower back, and right shoulder pain.  Patient providers notes that that patient has been evaluated by other physicians for this fall including x-ray imaging.  Patient had x-ray imaging of right ribs and right shoulder on 6/27.  Imaging showed no acute osseous abnormality.  Per chart review x-ray imaging of lumbar spine obtained on 07/07/2021 showed no acute osseous abnormality  Patient has continued lumbar back pain which is now radiating into his left lower leg.  Will obtain CT imaging to look for acute osseous abnormality missed on x-ray imaging.  Patient complains of pain to his entire right arm concern for possible cervical radiculopathy.  Will obtain CT imaging of cervical spine for further evaluation.  Patient given Tylenol and lidocaine patch for pain management.  I personally viewed and interpreted patient's CT imaging.  Agree with radiology interpretation of: -No acute osseous abnormality to cervical or lumbar spine. -Small disc herniations at both L4-L5 and L5-S1.  Mild if any associated spinal stenosis.   With lumbar radiculopathy will treat patient with 5-day course of steroids.  Patient has prescription for diclofenac.  We provide patient with lidocaine patch.  We will give patient information to follow-up with neurosurgery due to disc herniations.  Based on patient's chief complaint, I considered admission might be necessary, however after reassuring ED workup feel patient is reasonable for discharge.  Discussed results, findings, treatment and follow up. Patient advised of return precautions. Patient verbalized understanding and agreed with plan.  Portions of this note were generated with Scientist, clinical (histocompatibility and immunogenetics). Dictation errors may occur despite best attempts at proofreading.         Final Clinical  Impression(s) / ED Diagnoses Final diagnoses:  None    Rx / DC Orders ED Discharge Orders     None         Berneice Heinrich 07/21/21 1333    Benjiman Core, MD 07/21/21 [phone]

## 2021-07-21 NOTE — ED Triage Notes (Signed)
Pt states he fell on the job on 6/25 and is now having pain in left lower back that radiates to left hip to left leg, right ribs and right arm painx6/25. Pt was able to ambulate from waiting room to exam room.

## 2021-07-22 ENCOUNTER — Other Ambulatory Visit: Payer: Self-pay

## 2021-08-03 ENCOUNTER — Ambulatory Visit: Payer: Self-pay | Admitting: Nurse Practitioner

## 2021-08-03 ENCOUNTER — Other Ambulatory Visit: Payer: Self-pay | Admitting: Nurse Practitioner

## 2021-08-03 ENCOUNTER — Other Ambulatory Visit: Payer: Self-pay

## 2021-08-03 DIAGNOSIS — M5126 Other intervertebral disc displacement, lumbar region: Secondary | ICD-10-CM

## 2021-08-03 DIAGNOSIS — G8929 Other chronic pain: Secondary | ICD-10-CM

## 2021-08-03 MED ORDER — ACETAMINOPHEN-CODEINE 300-30 MG PO TABS
1.0000 | ORAL_TABLET | Freq: Two times a day (BID) | ORAL | 0 refills | Status: DC | PRN
  Filled 2021-08-03: qty 60, 30d supply, fill #0

## 2021-08-14 ENCOUNTER — Other Ambulatory Visit: Payer: Self-pay

## 2021-08-14 MED ORDER — DICLOFENAC SODIUM 75 MG PO TBEC
DELAYED_RELEASE_TABLET | ORAL | 2 refills | Status: DC
  Filled 2021-08-14: qty 60, 30d supply, fill #0

## 2021-08-19 ENCOUNTER — Ambulatory Visit: Payer: Self-pay | Attending: Neurological Surgery

## 2021-08-19 ENCOUNTER — Other Ambulatory Visit: Payer: Self-pay

## 2021-08-19 DIAGNOSIS — R262 Difficulty in walking, not elsewhere classified: Secondary | ICD-10-CM

## 2021-08-19 DIAGNOSIS — M6281 Muscle weakness (generalized): Secondary | ICD-10-CM

## 2021-08-19 DIAGNOSIS — M5459 Other low back pain: Secondary | ICD-10-CM

## 2021-08-19 DIAGNOSIS — G8929 Other chronic pain: Secondary | ICD-10-CM

## 2021-08-19 DIAGNOSIS — M25511 Pain in right shoulder: Secondary | ICD-10-CM | POA: Insufficient documentation

## 2021-08-19 DIAGNOSIS — M25552 Pain in left hip: Secondary | ICD-10-CM
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
## 2021-08-21 ENCOUNTER — Other Ambulatory Visit: Payer: Self-pay

## 2021-08-21 ENCOUNTER — Encounter (HOSPITAL_COMMUNITY): Payer: Self-pay

## 2021-08-21 ENCOUNTER — Ambulatory Visit (HOSPITAL_COMMUNITY)
Admission: EM | Admit: 2021-08-21 | Discharge: 2021-08-21 | Disposition: A | Discharge status: Home / Self Care | Payer: Self-pay | Attending: Family Medicine | Admitting: Family Medicine

## 2021-08-21 DIAGNOSIS — L239 Allergic contact dermatitis, unspecified cause: Secondary | ICD-10-CM

## 2021-08-21 MED ORDER — TRIAMCINOLONE ACETONIDE 0.1 % EX CREA
1.0000 | TOPICAL_CREAM | Freq: Two times a day (BID) | CUTANEOUS | 0 refills | Status: DC
  Filled 2021-08-21: qty 80, 40d supply, fill #0

## 2021-08-21 MED ORDER — PREDNISONE 20 MG PO TABS
40.0000 mg | ORAL_TABLET | Freq: Every day | ORAL | 0 refills | Status: AC
  Filled 2021-08-21: qty 10, 5d supply, fill #0

## 2021-08-21 MED ORDER — TRIAMCINOLONE ACETONIDE 40 MG/ML IJ SUSP
40.0000 mg | Freq: Once | INTRAMUSCULAR | Status: AC
  Administered 2021-08-21: 40 mg via INTRAMUSCULAR

## 2021-08-21 MED ORDER — TRIAMCINOLONE ACETONIDE 40 MG/ML IJ SUSP
INTRAMUSCULAR | Status: AC
  Filled 2021-08-21: qty 1

## 2021-08-21 NOTE — Discharge Instructions (Addendum)
You have been given a shot of triamcinolone 40 mg(Le hemos dado una inyeccion de triamcinolone 40 mg).  Take prednisone 20 mg--2 daily for 5 days(tome 2 tabletas por la boca diaria por 5 dias)  Put triamcinolone cream on the rash/bumps 2 times daily until better(Aplique crema a las ronchas 2 veces al dia hasta esta mejor)  Keep your appointment with internist for later in August( Quiero que Ud puede establecer con la internista luego este mes)

## 2021-08-21 NOTE — ED Triage Notes (Signed)
Per interpreter, pt c/o hives to abdomen, hands, legs and face x3 days. Denies taking any meds.

## 2021-08-21 NOTE — ED Provider Notes (Signed)
[facility]    CSN: [id number] Arrival date & time: 08/21/21  1329      History   Chief Complaint Chief Complaint  Patient presents with   Urticaria    [name] is a 51 y.o. male.    Urticaria   Here for itchy bumps that began breaking out in the last week.  No fever or chills that he knows of.  Be a little trouble breathing.  No vomiting or diarrhea.  Nobody else in the house has similar spots.  Past Medical History:  Diagnosis Date   Asthma    Herpes    Shingles     Patient Active Problem List   Diagnosis Date Noted   Acute bilateral low back pain without sciatica 06/05/2020   Acute pain of right shoulder 06/05/2020   Asthma due to environmental allergies 12/20/2016   HSV (herpes simplex virus) infection 02/24/2015   Anxiety 04/02/2014    Past Surgical History:  Procedure Laterality Date   MOUTH SURGERY         Home Medications    Prior to Admission medications   Medication Sig Start Date End Date Taking? Authorizing Provider  predniSONE (DELTASONE) 20 MG tablet Take 2 tablets (40 mg total) by mouth daily with breakfast for 5 days. 08/21/21 08/26/21 Yes Zenia Resides, MD  triamcinolone cream (KENALOG) 0.1 % Apply 1 Application topically 2 (two) times daily. To affected area till better 08/21/21  Yes Lei Dower, Janace Aris, MD  acetaminophen-codeine (TYLENOL #3) 300-30 MG tablet Take 1 tablet by mouth every 12 (twelve) hours as needed for severe pain. 08/03/21   Claiborne Rigg, NP  fluticasone (FLONASE) 50 MCG/ACT nasal spray Place 1 spray into both nostrils daily for 3 days. 06/01/21 06/26/21  Gustavus Bryant, FNP  SUMAtriptan (IMITREX) 50 MG tablet Take one for headache.  May repeat in 2 hours.  No more than 2 a day Patient not taking: Reported on 02/09/2018 05/27/17 07/26/18  Eustace Moore, MD    Family History Family History  Problem Relation Age of Onset   Cancer Father     Social History Social History   Tobacco Use    Smoking status: Former   Smokeless tobacco: Never  Building services engineer Use: Never used  Substance Use Topics   Alcohol use: No   Drug use: No     Allergies   Cyclobenzaprine, Ibuprofen, Methocarbamol, Amoxicillin, and Penicillins   Review of Systems Review of Systems   Physical Exam Triage Vital Signs ED Triage Vitals [08/21/21 1501]  Enc Vitals Group     BP 111/70     Pulse Rate 73     Resp 18     Temp 98.9 F (37.2 C)     Temp Source Oral     SpO2 96 %     Weight      Height      Head Circumference      Peak Flow      Pain Score 0     Pain Loc      Pain Edu?      Excl. in GC?    No data found.  Updated Vital Signs BP 111/70 (BP Location: Left Arm)   Pulse 73   Temp 98.9 F (37.2 C) (Oral)   Resp 18   SpO2 96%   Visual Acuity Right Eye Distance:   Left Eye Distance:   Bilateral Distance:    Right Eye Near:  Left Eye Near:    Bilateral Near:     Physical Exam Vitals reviewed.  Constitutional:      General: He is not in acute distress.    Appearance: He is not ill-appearing, toxic-appearing or diaphoretic.  HENT:     Mouth/Throat:     Mouth: Mucous membranes are moist.     Pharynx: No oropharyngeal exudate or posterior oropharyngeal erythema.  Eyes:     Extraocular Movements: Extraocular movements intact.     Conjunctiva/sclera: Conjunctivae normal.     Pupils: Pupils are equal, round, and reactive to light.  Cardiovascular:     Rate and Rhythm: Normal rate and regular rhythm.     Heart sounds: No murmur heard. Pulmonary:     Effort: Pulmonary effort is normal. No respiratory distress.     Breath sounds: No stridor. No wheezing, rhonchi or rales.  Abdominal:     Palpations: Abdomen is soft.  Musculoskeletal:     Cervical back: Neck supple.     Right lower leg: No edema.     Left lower leg: No edema.  Lymphadenopathy:     Cervical: No cervical adenopathy.  Skin:    Capillary Refill: Capillary refill takes less than 2 seconds.      Coloration: Skin is not jaundiced or pale.     Comments: There are scattered erythematous bumps, mainly on his lower abdomen and some on his legs, and they are mostly about 1.5 x 0.5 cm.  They are not densely indurated, but are raised a little and some have a central pustule.  Neurological:     General: No focal deficit present.     Mental Status: He is alert.  Psychiatric:        Behavior: Behavior normal.      UC Treatments / Results  Labs (all labs ordered are listed, but only abnormal results are displayed) Labs Reviewed - No data to display  EKG   Radiology No results found.  Procedures Procedures (including critical care time)  Medications Ordered in UC Medications  triamcinolone acetonide (KENALOG-40) injection 40 mg (has no administration in time range)    Initial Impression / Assessment and Plan / UC Course  I have reviewed the triage vital signs and the nursing notes.  Pertinent labs & imaging results that were available during my care of the patient were reviewed by me and considered in my medical decision making (see chart for details).     This is most likely still allergic, but I do suspect this could be insect bites.  We will treat the same, however with triamcinolone injection, prednisone for 5 days and some triamcinolone cream to put on the bites.  He does have a new patient appointment with an internist coming up later this month Final Clinical Impressions(s) / UC Diagnoses   Final diagnoses:  Allergic dermatitis     Discharge Instructions      You have been given a shot of triamcinolone 40 mg(Le hemos dado una inyeccion de triamcinolone 40 mg).  Take prednisone 20 mg--2 daily for 5 days(tome 2 tabletas por la boca diaria por 5 dias)  Put triamcinolone cream on the rash/bumps 2 times daily until better(Aplique crema a las ronchas 2 veces al dia hasta esta mejor)  Keep your appointment with internist for later in AugustArelia Sneddon que Ud puede  establecer con la internista luego este mes)     ED Prescriptions     Medication Sig Dispense Auth. Provider   predniSONE (DELTASONE)  20 MG tablet Take 2 tablets (40 mg total) by mouth daily with breakfast for 5 days. 10 tablet Zenia Resides, MD   triamcinolone cream (KENALOG) 0.1 % Apply 1 Application topically 2 (two) times daily. To affected area till better 80 g Marlinda Mike Janace Aris, MD      PDMP not reviewed this encounter.   Zenia Resides, MD 08/21/21 [phone]

## 2021-08-26 ENCOUNTER — Ambulatory Visit: Payer: Self-pay | Admitting: Physical Therapy

## 2021-08-26 ENCOUNTER — Encounter: Payer: Self-pay | Admitting: Physical Therapy

## 2021-08-26 DIAGNOSIS — M5459 Other low back pain: Secondary | ICD-10-CM

## 2021-08-26 DIAGNOSIS — M6281 Muscle weakness (generalized): Secondary | ICD-10-CM

## 2021-08-26 DIAGNOSIS — R262 Difficulty in walking, not elsewhere classified: Secondary | ICD-10-CM

## 2021-08-26 NOTE — Therapy (Signed)
OUTPATIENT PHYSICAL THERAPY TREATMENT NOTE   Patient Name: Gabriel Ball MRN: [id number] DOB:27-May-1970, 51 y.o., male Today's Date: 08/26/2021  PCP: Gabriel Rigg, NP   REFERRING PROVIDER: Bethann Goo, DO   PT End of Session - 08/26/21 0914     Visit Number 2    Number of Visits 9    Date for PT Re-Evaluation 10/21/21    Authorization Type None    Authorization Time Period FOTO v6, v10    PT Start Time 0915    PT Stop Time 0956    PT Time Calculation (min) 41 min    Activity Tolerance Patient limited by pain    Behavior During Therapy [facility] for tasks assessed/performed             Past Medical History:  Diagnosis Date   Asthma    Herpes    Shingles    Past Surgical History:  Procedure Laterality Date   MOUTH SURGERY     Patient Active Problem List   Diagnosis Date Noted   Acute bilateral low back pain without sciatica 06/05/2020   Acute pain of right shoulder 06/05/2020   Asthma due to environmental allergies 12/20/2016   HSV (herpes simplex virus) infection 02/24/2015   Anxiety 04/02/2014    THERAPY DIAG:  Other low back pain  Muscle weakness (generalized)  Difficulty in walking, not elsewhere classified  REFERRING DIAG:  Z91.81 (ICD-10-CM) - History of falling  PERTINENT HISTORY: Fall from countertop on 07/07/2021  PRECAUTIONS/RESTRICTIONS:   none  SUBJECTIVE:  Pt reports continued high levels of pain  PAIN:  Are you having pain? Yes: NPRS scale: 8/10 Pain location: Rt shoulder, low back, Lt hip Pain description: sharp Aggravating factors: Shoulder: moving arm overhead, lifting any amount of weight; low back: prolonged sitting > 10 minutes. Relieving factors: pain medication/ laying down  OBJECTIVE:  DIAGNOSTIC FINDINGS:    CT Lumbar Spine without contrast: IMPRESSION: 1. No acute osseous abnormality in the lumbar spine. 2. CT evidence of small disc herniations at both L4-L5 and L5-S1. Mild if any associated spinal stenosis,  but the discs might be a source of L5 and/or S1 radiculitis.   07/14/2021: DG Shoulder RT: IMPRESSION: No evidence of fracture or dislocation.   PATIENT SURVEYS:  FOTO Assess at treatment one   SCREENING FOR RED FLAGS: Bowel or bladder incontinence: No Cauda equina syndrome: No   COGNITION:           Overall cognitive status: Within functional limits for tasks assessed                          SENSATION: Not tested     POSTURE: rounded shoulders, forward head, and decreased lumbar lordosis   PALPATION: TTP to Rt supraspinatus musculotendinous junction   LUMBAR ROM:    Active  A/PROM  eval  Flexion 50%, painful with referred pain down Lt leg  Extension 50%, pain  Right lateral flexion 50%, pain  Left lateral flexion 50%  Right rotation 50%  Left rotation 50%, shoulder pain   (Blank rows = not tested)   UPPER EXTREMITY ROM:    Active ROM Right 08/19/2021 Left 08/19/2021  Shoulder flexion 100p! 170  Shoulder abduction 90p! 160  Shoulder internal rotation 10p! 80  Shoulder external rotation 30p! 80  (Blank rows = not tested)   UPPER EXTREMITY MMT:   MMT Right 08/19/2021 Left 08/19/2021  Shoulder flexion 4/5p! 5/5  Shoulder abduction 4/5p! 5/5  Shoulder internal rotation 4+/5p! 5/5  Shoulder external rotation 4/5p! 5/5      LOWER EXTREMITY MMT:     MMT Right eval Left eval  Hip flexion 4/5 4/5p!  Hip extension 3/5 2+/5p!  Hip abduction 3/5p! 2/5p!  Knee flexion 5/5 4/5p!  Knee extension 5/5 4/5p!  Ankle dorsiflexion 5/5 4/5  Ankle plantarflexion 5/5 4/5   (Blank rows = not tested)   SPECIAL TESTS:  Hawkins-Kennedy: (+) on Rt Neer's: (+) on Rt ERLS: (-) on Rt Slump: (+) on Lt SLR: (+) on Lt Cross-leg active SLR: (+) on Lt for LBP     FUNCTIONAL TESTS:  5xSTS with UE support: 50 seconds with Lt hip/ knee pain Squat: Not tested Plank: Not tested     TODAY'S TREATMENT  None due to time constraints     PATIENT EDUCATION:  Education details:  Pt educated about probable underlying pathophysiology, POC, prognosis, FOTO, and HEP Person educated: Patient Education method: Programmer, multimedia, Demonstration, and Handouts Education comprehension: verbalized understanding and returned demonstration     HOME EXERCISE PROGRAM: Access Code: [email or web address] URL: [email or web address] Date: 08/26/2021 Prepared by: Alphonzo Severance  Exercises - Supine Lower Trunk Rotation  - 1 x daily - 7 x weekly - 1 sets - 20 reps - 3 hold - Supine Hip Adduction Isometric with Ball  - 1 x daily - 7 x weekly - 2 sets - 10 reps - 10'' hold - Hooklying Clamshell with Resistance  - 1 x daily - 7 x weekly - 3 sets - 10 reps - Sidelying Shoulder External Rotation  - 1 x daily - 7 x weekly - 3 sets - 10 reps   TREATMENT 08/26/2021:  Therapeutic Exercise: - LTR - 20x - hip adduction squeeze - 5'' 2x10 - supine clam - YTB - 3x10 - small arc bridge - 3x10 - Sciatic nerve glide L - 20x - S/L shoulder ER - 3x15 - pain free arc shoulder abd in S/L - 3x10  Therapeutic Activity: - administering FOTO    ASSESSMENT:   CLINICAL IMPRESSION: [name] toelrated session well with no adverse reaction.  He likely has an adductor strain from his fall.  He did have a small increase in pain with LE and core exercises, but overall his pain fell following therex.  HEP updated.     OBJECTIVE IMPAIRMENTS Abnormal gait, decreased activity tolerance, decreased balance, decreased coordination, decreased endurance, decreased knowledge of use of DME, decreased mobility, difficulty walking, decreased ROM, decreased strength, hypomobility, increased edema, impaired flexibility, impaired sensation, impaired UE functional use, improper body mechanics, postural dysfunction, and pain.    ACTIVITY LIMITATIONS carrying, lifting, bending, sitting, standing, squatting, sleeping, stairs, transfers, bed mobility, reach over head, hygiene/grooming, and locomotion level   PARTICIPATION  LIMITATIONS: cleaning, laundry, driving, shopping, community activity, occupation, and yard work   PERSONAL FACTORS Multiple treatment areas are also affecting patient's functional outcome.    REHAB POTENTIAL: Good   CLINICAL DECISION MAKING: Evolving/moderate complexity   EVALUATION COMPLEXITY: Moderate     GOALS: Goals reviewed with patient? Yes   SHORT TERM GOALS: Target date: 09/16/2021   Pt will report understanding and adherence to initial HEP in order to promote independence in the management of primary impairments. Baseline: HEP provided at eval Goal status: INITIAL     LONG TERM GOALS: Target date: 10/14/2021   Pt will achieve a FOTO score meeting the predictive value in order to demonstrate improved functional ability as it relates to his primary impairments. Baseline: To be administered  at first treatment Goal status: INITIAL   2.  Pt will achieve BIL global hip strength of 5/5 in order to progress his independent LE strengthening regimen with less limitation. Baseline: See MMT chart Goal status: INITIAL   3.  Pt will achieve WNL global lumbar AROM with 0-4/10 pain in order to get dressed with less limitation. Baseline: See AROM chart (>8/10 pain in all affected movements) Goal status: INITIAL   4.  Pt will achieve Rt shoulder elevation AROM of 150 degrees or higher in order to reach into overhead cabinets with less limitation. Baseline: Flexion: 100 degrees, abduction: 80 degrees Goal status: INITIAL   5.  Pt will report ability to sit > 30 minutes with 0-4/10 pain in order to go on car trips with less limitation. Baseline: >8/10 pain with 10 minutes of sitting Goal status: INITIAL       PLAN: PT FREQUENCY: 1x/week   PT DURATION: 8 weeks   PLANNED INTERVENTIONS: Therapeutic exercises, Therapeutic activity, Neuromuscular re-education, Balance training, Gait training, Patient/Family education, Self Care, Joint mobilization, Joint manipulation, Stair training,  Orthotic/Fit training, DME instructions, Aquatic Therapy, Dry Needling, Electrical stimulation, Spinal manipulation, Spinal mobilization, Cryotherapy, Moist heat, Taping, Vasopneumatic device, Traction, Biofeedback, Ionotophoresis 4mg /ml Dexamethasone, Manual therapy, and Re-evaluation.   PLAN FOR NEXT SESSION: Further assess Lt hip, administer FOTO, provide HEP, progress RTC/ core/ LE strengthening   Lauramae Kneisley PT 08/26/2021, 9:59 AM

## 2021-09-02 ENCOUNTER — Ambulatory Visit: Payer: Self-pay | Admitting: Physical Therapy

## 2021-09-02 ENCOUNTER — Encounter: Payer: Self-pay | Admitting: Physical Therapy

## 2021-09-02 DIAGNOSIS — M6281 Muscle weakness (generalized): Secondary | ICD-10-CM

## 2021-09-02 DIAGNOSIS — G8929 Other chronic pain: Secondary | ICD-10-CM

## 2021-09-02 DIAGNOSIS — R262 Difficulty in walking, not elsewhere classified: Secondary | ICD-10-CM

## 2021-09-02 DIAGNOSIS — M25552 Pain in left hip: Secondary | ICD-10-CM

## 2021-09-02 DIAGNOSIS — M5459 Other low back pain: Secondary | ICD-10-CM

## 2021-09-02 NOTE — Therapy (Signed)
OUTPATIENT PHYSICAL THERAPY TREATMENT NOTE   Patient Name: Gabriel Ball MRN: [id number] DOB:10/31/1970, 51 y.o., male Today's Date: 09/02/2021  PCP: Claiborne Rigg, NP   REFERRING PROVIDER: Bethann Goo, DO   PT End of Session - 09/02/21 0919     Visit Number 3    Number of Visits 9    Date for PT Re-Evaluation 10/21/21    Authorization Type None    Authorization Time Period FOTO v6, v10    PT Start Time 0915    PT Stop Time 0956    PT Time Calculation (min) 41 min    Activity Tolerance Patient limited by pain    Behavior During Therapy [facility] for tasks assessed/performed              Past Medical History:  Diagnosis Date   Asthma    Herpes    Shingles    Past Surgical History:  Procedure Laterality Date   MOUTH SURGERY     Patient Active Problem List   Diagnosis Date Noted   Acute bilateral low back pain without sciatica 06/05/2020   Acute pain of right shoulder 06/05/2020   Asthma due to environmental allergies 12/20/2016   HSV (herpes simplex virus) infection 02/24/2015   Anxiety 04/02/2014    THERAPY DIAG:  Other low back pain  Muscle weakness (generalized)  Difficulty in walking, not elsewhere classified  Chronic right shoulder pain  Pain in left hip  REFERRING DIAG:  Z91.81 (ICD-10-CM) - History of falling  PERTINENT HISTORY: Fall from countertop on 07/07/2021  PRECAUTIONS/RESTRICTIONS:   none  SUBJECTIVE:  Pt reports that he feels about the same as last time, but overall is doing ok.  PAIN:  Are you having pain? Yes: NPRS scale: 7-8/10 Pain location: Rt shoulder, low back, Lt hip Pain description: sharp Aggravating factors: Shoulder: moving arm overhead, lifting any amount of weight; low back: prolonged sitting > 10 minutes. Relieving factors: pain medication/ laying down  OBJECTIVE:  DIAGNOSTIC FINDINGS:    CT Lumbar Spine without contrast: IMPRESSION: 1. No acute osseous abnormality in the lumbar spine. 2. CT  evidence of small disc herniations at both L4-L5 and L5-S1. Mild if any associated spinal stenosis, but the discs might be a source of L5 and/or S1 radiculitis.   07/14/2021: DG Shoulder RT: IMPRESSION: No evidence of fracture or dislocation.   PATIENT SURVEYS:  FOTO Assess at treatment one   SCREENING FOR RED FLAGS: Bowel or bladder incontinence: No Cauda equina syndrome: No   COGNITION:           Overall cognitive status: Within functional limits for tasks assessed                          SENSATION: Not tested     POSTURE: rounded shoulders, forward head, and decreased lumbar lordosis   PALPATION: TTP to Rt supraspinatus musculotendinous junction   LUMBAR ROM:    Active  A/PROM  eval  Flexion 50%, painful with referred pain down Lt leg  Extension 50%, pain  Right lateral flexion 50%, pain  Left lateral flexion 50%  Right rotation 50%  Left rotation 50%, shoulder pain   (Blank rows = not tested)   UPPER EXTREMITY ROM:    Active ROM Right 08/19/2021 Left 08/19/2021  Shoulder flexion 100p! 170  Shoulder abduction 90p! 160  Shoulder internal rotation 10p! 80  Shoulder external rotation 30p! 80  (Blank rows = not tested)   UPPER  EXTREMITY MMT:   MMT Right 08/19/2021 Left 08/19/2021  Shoulder flexion 4/5p! 5/5  Shoulder abduction 4/5p! 5/5  Shoulder internal rotation 4+/5p! 5/5  Shoulder external rotation 4/5p! 5/5      LOWER EXTREMITY MMT:     MMT Right eval Left eval  Hip flexion 4/5 4/5p!  Hip extension 3/5 2+/5p!  Hip abduction 3/5p! 2/5p!  Knee flexion 5/5 4/5p!  Knee extension 5/5 4/5p!  Ankle dorsiflexion 5/5 4/5  Ankle plantarflexion 5/5 4/5   (Blank rows = not tested)   SPECIAL TESTS:  Hawkins-Kennedy: (+) on Rt Neer's: (+) on Rt ERLS: (-) on Rt Slump: (+) on Lt SLR: (+) on Lt Cross-leg active SLR: (+) on Lt for LBP     FUNCTIONAL TESTS:  5xSTS with UE support: 50 seconds with Lt hip/ knee pain Squat: Not tested Plank: Not tested      TODAY'S TREATMENT  None due to time constraints     PATIENT EDUCATION:  Education details: Pt educated about probable underlying pathophysiology, POC, prognosis, FOTO, and HEP Person educated: Patient Education method: Programmer, multimedia, Demonstration, and Handouts Education comprehension: verbalized understanding and returned demonstration     HOME EXERCISE PROGRAM: Access Code: [email or web address] URL: [email or web address] Date: 08/26/2021 Prepared by: Alphonzo Severance  Exercises - Supine Lower Trunk Rotation  - 1 x daily - 7 x weekly - 1 sets - 20 reps - 3 hold - Supine Hip Adduction Isometric with Ball  - 1 x daily - 7 x weekly - 2 sets - 10 reps - 10'' hold - Hooklying Clamshell with Resistance  - 1 x daily - 7 x weekly - 3 sets - 10 reps - Sidelying Shoulder External Rotation  - 1 x daily - 7 x weekly - 3 sets - 10 reps   TREATMENT 09/02/2021:  Therapeutic Exercise: - nu-step L5 66m while taking subjective and planning session with patient - LTR - 20x - hip adduction squeeze - 5'' 2x10 - supine alternating clam - RTB - 3x10 - bridge - 2x10 - Sciatic nerve glide L - 20x - S/L shoulder ER - 3x15 - pain free arc shoulder abd in S/L - 3x10  Manual Therapy - CPA mid and lower lumbar spine  TREATMENT 08/26/2021:  Therapeutic Exercise: - LTR - 20x - hip adduction squeeze - 5'' 2x10 - supine clam - YTB - 3x10 - small arc bridge - 3x10 - Sciatic nerve glide L - 20x - S/L shoulder ER - 3x15 - pain free arc shoulder abd in S/L - 3x10  Therapeutic Activity: - administering FOTO    ASSESSMENT:   CLINICAL IMPRESSION: Yaniel tolerated session well with no adverse reaction.  He remains limited by pain but does show increased ability to complete mat exercises with reduced pain and more full ROM.  I recommended he seek referral to ortho for thigh and shoulder pain as he has not been evaluated for these since his accident and they remain quite painful.     OBJECTIVE  IMPAIRMENTS Abnormal gait, decreased activity tolerance, decreased balance, decreased coordination, decreased endurance, decreased knowledge of use of DME, decreased mobility, difficulty walking, decreased ROM, decreased strength, hypomobility, increased edema, impaired flexibility, impaired sensation, impaired UE functional use, improper body mechanics, postural dysfunction, and pain.    ACTIVITY LIMITATIONS carrying, lifting, bending, sitting, standing, squatting, sleeping, stairs, transfers, bed mobility, reach over head, hygiene/grooming, and locomotion level   PARTICIPATION LIMITATIONS: cleaning, laundry, driving, shopping, community activity, occupation, and yard work   PERSONAL FACTORS Multiple treatment areas are  also affecting patient's functional outcome.    REHAB POTENTIAL: Good   CLINICAL DECISION MAKING: Evolving/moderate complexity   EVALUATION COMPLEXITY: Moderate     GOALS: Goals reviewed with patient? Yes   SHORT TERM GOALS: Target date: 09/16/2021   Pt will report understanding and adherence to initial HEP in order to promote independence in the management of primary impairments. Baseline: HEP provided at eval Goal status: INITIAL     LONG TERM GOALS: Target date: 10/14/2021   Pt will achieve a FOTO score meeting the predictive value in order to demonstrate improved functional ability as it relates to his primary impairments. Baseline: To be administered at first treatment Goal status: INITIAL   2.  Pt will achieve BIL global hip strength of 5/5 in order to progress his independent LE strengthening regimen with less limitation. Baseline: See MMT chart Goal status: INITIAL   3.  Pt will achieve WNL global lumbar AROM with 0-4/10 pain in order to get dressed with less limitation. Baseline: See AROM chart (>8/10 pain in all affected movements) Goal status: INITIAL   4.  Pt will achieve Rt shoulder elevation AROM of 150 degrees or higher in order to reach into  overhead cabinets with less limitation. Baseline: Flexion: 100 degrees, abduction: 80 degrees Goal status: INITIAL   5.  Pt will report ability to sit > 30 minutes with 0-4/10 pain in order to go on car trips with less limitation. Baseline: >8/10 pain with 10 minutes of sitting Goal status: INITIAL       PLAN: PT FREQUENCY: 1x/week   PT DURATION: 8 weeks   PLANNED INTERVENTIONS: Therapeutic exercises, Therapeutic activity, Neuromuscular re-education, Balance training, Gait training, Patient/Family education, Self Care, Joint mobilization, Joint manipulation, Stair training, Orthotic/Fit training, DME instructions, Aquatic Therapy, Dry Needling, Electrical stimulation, Spinal manipulation, Spinal mobilization, Cryotherapy, Moist heat, Taping, Vasopneumatic device, Traction, Biofeedback, Ionotophoresis 4mg /ml Dexamethasone, Manual therapy, and Re-evaluation.   PLAN FOR NEXT SESSION: Further assess Lt hip, administer FOTO, provide HEP, progress RTC/ core/ LE strengthening   Makylie Rivere PT 09/02/2021, 10:00 AM

## 2021-09-07 ENCOUNTER — Encounter: Payer: Self-pay | Admitting: Nurse Practitioner

## 2021-09-07 ENCOUNTER — Other Ambulatory Visit: Payer: Self-pay

## 2021-09-07 ENCOUNTER — Ambulatory Visit: Payer: Self-pay | Attending: Nurse Practitioner | Admitting: Nurse Practitioner

## 2021-09-07 VITALS — BP 106/66 | HR 71 | Temp 98.1°F | Ht 65.0 in | Wt 178.0 lb

## 2021-09-07 DIAGNOSIS — W19XXXD Unspecified fall, subsequent encounter: Secondary | ICD-10-CM

## 2021-09-07 DIAGNOSIS — M25511 Pain in right shoulder: Secondary | ICD-10-CM

## 2021-09-07 DIAGNOSIS — Z1211 Encounter for screening for malignant neoplasm of colon: Secondary | ICD-10-CM

## 2021-09-07 DIAGNOSIS — Z0000 Encounter for general adult medical examination without abnormal findings: Secondary | ICD-10-CM

## 2021-09-07 MED ORDER — BACLOFEN 10 MG PO TABS
10.0000 mg | ORAL_TABLET | Freq: Three times a day (TID) | ORAL | 1 refills | Status: DC
  Filled 2021-09-07: qty 60, 20d supply, fill #0

## 2021-09-07 NOTE — Progress Notes (Signed)
Assessment & Plan:  Gabriel Ball was seen today for annual exam.  Diagnoses and all orders for this visit:  Encounter for annual physical exam -     CMP14+EGFR; Future -     Lipid panel; Future -     CBC with Differential; Future -     Hemoglobin A1c; Future  Screening for colon cancer -     Ambulatory referral to Gastroenterology  Fall, subsequent encounter -     baclofen (LIORESAL) 10 MG tablet; Take 1 tablet (10 mg total) by mouth 3 (three) times daily.  Acute pain of right shoulder -     Ambulatory referral to Orthopedic Surgery    Patient has been counseled on age-appropriate routine health concerns for screening and prevention. These are reviewed and up-to-date. Referrals have been placed accordingly. Immunizations are up-to-date or declined.    Subjective:   Chief Complaint  Patient presents with   Annual Exam   [name] 51 y.o. male presents to office today for annual physical exam  He has a past medical history of Asthma, Herpes, and Shingles.   Patient sustained a fall on 07-07-2021. The patient states that he was standing on a short 3 foot ladder with 3 steps, he tripped and caught his left foot, attempted to grab onto a door to catch himself, but his grip slipped.  He ended up falling onto his bottom and lower back.  He denies head trauma, LOC, headaches, dizziness. Since that time he has been experiencing upper and low back, right shoulder and bilateral knee and hip pain. He is currently working with PT for low back pain.  Right shoulder xray negative despite prn pain medication  CT Lumbar Spine small disc herniations at both L4-L5 and L5-S1. Mild if any associated spinal stenosis, but the discs might be a source of L5 and/or S1 radiculitis.  CT cervical spine negative.    Review of Systems  Constitutional:  Negative for fever, malaise/fatigue and weight loss.  HENT: Negative.  Negative for nosebleeds.   Eyes: Negative.  Negative for blurred  vision, double vision and photophobia.  Respiratory: Negative.  Negative for cough and shortness of breath.   Cardiovascular: Negative.  Negative for chest pain, palpitations and leg swelling.  Gastrointestinal: Negative.  Negative for heartburn, nausea and vomiting.  Genitourinary: Negative.   Musculoskeletal:  Positive for back pain, joint pain and neck pain. Negative for myalgias.  Skin: Negative.   Neurological: Negative.  Negative for dizziness, focal weakness, seizures and headaches.  Endo/Heme/Allergies: Negative.   Psychiatric/Behavioral: Negative.  Negative for suicidal ideas.     Past Medical History:  Diagnosis Date   Asthma    Herpes    Shingles     Past Surgical History:  Procedure Laterality Date   MOUTH SURGERY      Family History  Problem Relation Age of Onset   Cancer Father     Social History Reviewed with no changes to be made today.   Outpatient Medications Prior to Visit  Medication Sig Dispense Refill   acetaminophen-codeine (TYLENOL #3) 300-30 MG tablet Take 1 tablet by mouth every 12 (twelve) hours as needed for severe pain. 60 tablet 0   triamcinolone cream (KENALOG) 0.1 % Apply to affected area(s) 2 (two) times daily until area is better. 80 g 0   fluticasone (FLONASE) 50 MCG/ACT nasal spray Place 1 spray into both nostrils daily for 3 days. 16 g 0   No facility-administered medications prior to visit.  Allergies  Allergen Reactions   Cyclobenzaprine     Facial Numbness/dizziness/hypersomnolence   Ibuprofen Hives   Methocarbamol     Abdominal pain   Amoxicillin Itching    Pruritic rash   Penicillins Palpitations    Has patient had a PCN reaction causing immediate rash, facial/tongue/throat swelling, SOB or lightheadedness with hypotension: No Has patient had a PCN reaction causing severe rash involving mucus membranes or skin necrosis: No Has patient had a PCN reaction that required hospitalization: No Has patient had a PCN reaction  occurring within the last 10 years: No If all of the above answers are "NO", then may proceed with Cephalosporin use.       Objective:    BP 106/66   Pulse 71   Temp 98.1 F (36.7 C) (Oral)   Ht '5\' 5"'  (1.651 m)   Wt 178 lb (80.7 kg)   SpO2 100%   BMI 29.62 kg/m  Wt Readings from Last 3 Encounters:  09/07/21 178 lb (80.7 kg)  07/21/21 184 lb 15.5 oz (83.9 kg)  02/27/21 184 lb 15.5 oz (83.9 kg)    Physical Exam Constitutional:      Appearance: He is well-developed.  HENT:     Head: Normocephalic and atraumatic.     Right Ear: Hearing, tympanic membrane, ear canal and external ear normal.     Left Ear: Hearing, tympanic membrane, ear canal and external ear normal.     Nose: Nose normal. No mucosal edema or rhinorrhea.     Right Turbinates: Not enlarged.     Left Turbinates: Not enlarged.     Mouth/Throat:     Lips: Pink.     Mouth: Mucous membranes are moist.     Dentition: No gingival swelling, dental abscesses or gum lesions.     Pharynx: Uvula midline.     Tonsils: No tonsillar exudate. 1+ on the right. 1+ on the left.  Eyes:     General: Lids are normal. No scleral icterus.    Extraocular Movements: Extraocular movements intact.     Conjunctiva/sclera: Conjunctivae normal.     Pupils: Pupils are equal, round, and reactive to light.  Neck:     Thyroid: No thyromegaly.     Trachea: No tracheal deviation.  Cardiovascular:     Rate and Rhythm: Normal rate and regular rhythm.     Heart sounds: Normal heart sounds. No murmur heard.    No friction rub. No gallop.  Pulmonary:     Effort: Pulmonary effort is normal. No respiratory distress.     Breath sounds: Normal breath sounds. No wheezing or rales.  Chest:     Chest wall: No mass or tenderness.  Breasts:    Right: No inverted nipple, mass, nipple discharge, skin change or tenderness.     Left: No inverted nipple, mass, nipple discharge, skin change or tenderness.  Abdominal:     General: Bowel sounds are normal.  There is no distension.     Palpations: Abdomen is soft. There is no mass.     Tenderness: There is no abdominal tenderness. There is no guarding or rebound.  Musculoskeletal:        General: No deformity.     Right shoulder: Tenderness present. No swelling, deformity or effusion. Decreased range of motion.     Cervical back: Normal range of motion and neck supple.  Lymphadenopathy:     Cervical: No cervical adenopathy.  Skin:    General: Skin is warm and dry.  Capillary Refill: Capillary refill takes less than 2 seconds.     Findings: No erythema.  Neurological:     Mental Status: He is alert and oriented to person, place, and time.     Cranial Nerves: No cranial nerve deficit.     Sensory: Sensation is intact.     Motor: No abnormal muscle tone.     Coordination: Coordination is intact. Coordination normal.     Gait: Gait is intact.     Deep Tendon Reflexes: Reflexes normal.     Reflex Scores:      Patellar reflexes are 1+ on the right side and 1+ on the left side. Psychiatric:        Attention and Perception: Attention normal.        Mood and Affect: Mood normal.        Speech: Speech normal.        Behavior: Behavior normal.        Thought Content: Thought content normal.        Judgment: Judgment normal.          Patient has been counseled extensively about nutrition and exercise as well as the importance of adherence with medications and regular follow-up. The patient was given clear instructions to go to ER or return to medical center if symptoms don't improve, worsen or new problems develop. The patient verbalized understanding.   Follow-up: Return if symptoms worsen or fail to improve.   Gildardo Pounds, FNP-BC [facility] [place], [place]   09/07/2021, 4:26 PM

## 2021-09-09 ENCOUNTER — Ambulatory Visit: Payer: Self-pay

## 2021-09-09 DIAGNOSIS — M25552 Pain in left hip: Secondary | ICD-10-CM

## 2021-09-09 DIAGNOSIS — R262 Difficulty in walking, not elsewhere classified: Secondary | ICD-10-CM

## 2021-09-09 DIAGNOSIS — M5459 Other low back pain: Secondary | ICD-10-CM

## 2021-09-09 DIAGNOSIS — M6281 Muscle weakness (generalized): Secondary | ICD-10-CM

## 2021-09-09 DIAGNOSIS — G8929 Other chronic pain: Secondary | ICD-10-CM

## 2021-09-09 NOTE — Therapy (Signed)
OUTPATIENT PHYSICAL THERAPY TREATMENT NOTE   Patient Name: Gabriel Ball MRN: [id number] DOB:1970-04-08, 51 y.o., male Today's Date: 09/09/2021  PCP: Claiborne Rigg, NP   REFERRING PROVIDER: Bethann Goo, DO   PT End of Session - 09/09/21 0913     Visit Number 4    Number of Visits 9    Date for PT Re-Evaluation 10/21/21    Authorization Type None    Authorization Time Period FOTO v6, v10    PT Start Time 0915    PT Stop Time 0957    PT Time Calculation (min) 42 min    Activity Tolerance Patient limited by pain    Behavior During Therapy [facility] for tasks assessed/performed               Past Medical History:  Diagnosis Date   Asthma    Herpes    Shingles    Past Surgical History:  Procedure Laterality Date   MOUTH SURGERY     Patient Active Problem List   Diagnosis Date Noted   Acute bilateral low back pain without sciatica 06/05/2020   Acute pain of right shoulder 06/05/2020   Asthma due to environmental allergies 12/20/2016   HSV (herpes simplex virus) infection 02/24/2015   Anxiety 04/02/2014    THERAPY DIAG:  Other low back pain  Muscle weakness (generalized)  Difficulty in walking, not elsewhere classified  Chronic right shoulder pain  Pain in left hip  REFERRING DIAG:  Z91.81 (ICD-10-CM) - History of falling  PERTINENT HISTORY: Fall from countertop on 07/07/2021  PRECAUTIONS/RESTRICTIONS:   none  SUBJECTIVE:  Pt reports continued Rt shoulder, Lt lumbar, and Lt hip pain today rated 7-8/10. Pt reports he went walking for an hour on Saturday, which led to increased Rt lateral rib pain, however, this has since subsided. Pt reports daily adherence to his HEP. Pt reports he has an appointment with orthopedics to further evaluate his problems on 09/14/2021.   PAIN:  Are you having pain? Yes: NPRS scale: 7-8/10 Pain location: Rt shoulder, low back, Lt hip Pain description: sharp Aggravating factors: Shoulder: moving arm overhead, lifting  any amount of weight; low back: prolonged sitting > 10 minutes. Relieving factors: pain medication/ laying down  OBJECTIVE:  DIAGNOSTIC FINDINGS:    CT Lumbar Spine without contrast: IMPRESSION: 1. No acute osseous abnormality in the lumbar spine. 2. CT evidence of small disc herniations at both L4-L5 and L5-S1. Mild if any associated spinal stenosis, but the discs might be a source of L5 and/or S1 radiculitis.   07/14/2021: DG Shoulder RT: IMPRESSION: No evidence of fracture or dislocation.   PATIENT SURVEYS:  FOTO Assess at treatment one   SCREENING FOR RED FLAGS: Bowel or bladder incontinence: No Cauda equina syndrome: No   COGNITION:           Overall cognitive status: Within functional limits for tasks assessed                          SENSATION: Not tested     POSTURE: rounded shoulders, forward head, and decreased lumbar lordosis   PALPATION: TTP to Rt supraspinatus musculotendinous junction   LUMBAR ROM:    Active  A/PROM  eval  Flexion 50%, painful with referred pain down Lt leg  Extension 50%, pain  Right lateral flexion 50%, pain  Left lateral flexion 50%  Right rotation 50%  Left rotation 50%, shoulder pain   (Blank rows = not tested)  UPPER EXTREMITY ROM:    Active ROM Right 08/19/2021 Left 08/19/2021  Shoulder flexion 100p! 170  Shoulder abduction 90p! 160  Shoulder internal rotation 10p! 80  Shoulder external rotation 30p! 80  (Blank rows = not tested)   UPPER EXTREMITY MMT:   MMT Right 08/19/2021 Left 08/19/2021  Shoulder flexion 4/5p! 5/5  Shoulder abduction 4/5p! 5/5  Shoulder internal rotation 4+/5p! 5/5  Shoulder external rotation 4/5p! 5/5      LOWER EXTREMITY MMT:     MMT Right eval Left eval  Hip flexion 4/5 4/5p!  Hip extension 3/5 2+/5p!  Hip abduction 3/5p! 2/5p!  Knee flexion 5/5 4/5p!  Knee extension 5/5 4/5p!  Ankle dorsiflexion 5/5 4/5  Ankle plantarflexion 5/5 4/5   (Blank rows = not tested)   SPECIAL TESTS:   Hawkins-Kennedy: (+) on Rt Neer's: (+) on Rt ERLS: (-) on Rt Slump: (+) on Lt SLR: (+) on Lt Cross-leg active SLR: (+) on Lt for LBP     FUNCTIONAL TESTS:  5xSTS with UE support: 50 seconds with Lt hip/ knee pain Squat: Not tested Plank: Not tested     TODAY'S TREATMENT  None due to time constraints     PATIENT EDUCATION:  Education details: Pt educated about probable underlying pathophysiology, POC, prognosis, FOTO, and HEP Person educated: Patient Education method: Programmer, multimedia, Demonstration, and Handouts Education comprehension: verbalized understanding and returned demonstration     HOME EXERCISE PROGRAM: Access Code: [email or web address] URL: [email or web address] Date: 08/26/2021 Prepared by: Alphonzo Severance  Exercises - Supine Lower Trunk Rotation  - 1 x daily - 7 x weekly - 1 sets - 20 reps - 3 hold - Supine Hip Adduction Isometric with Ball  - 1 x daily - 7 x weekly - 2 sets - 10 reps - 10'' hold - Hooklying Clamshell with Resistance  - 1 x daily - 7 x weekly - 3 sets - 10 reps - Sidelying Shoulder External Rotation  - 1 x daily - 7 x weekly - 3 sets - 10 reps  Added 09/09/2021: - Seated Slump Nerve Glide  - 1 x daily - 7 x weekly - 3 sets - 20 reps - Plank on Knees  - 1 x daily - 7 x weekly - 3 sets - 30 segundos hold   [facility] Adult PT Treatment:                                                DATE: 09/09/2021 Therapeutic Exercise: Seated sciatic nerve glides 2x20 Knee planks 3x30 seconds Supine 90/90 abdominal isometric with handhold resistance 3x30 seconds Sidelying lumbar open book x10 BIL Side knee plank 2x10 BIL Manual Therapy: N/A Neuromuscular re-ed: N/A Therapeutic Activity: Detailed pt education concerning probable underlying pathophysiology leading to his pain presentation, including visual representation utilizing a spinal skeletal model Modalities: N/A Self Care: N/A   TREATMENT 09/02/2021:  Therapeutic Exercise: - nu-step L5 13m  while taking subjective and planning session with patient - LTR - 20x - hip adduction squeeze - 5'' 2x10 - supine alternating clam - RTB - 3x10 - bridge - 2x10 - Sciatic nerve glide L - 20x - S/L shoulder ER - 3x15 - pain free arc shoulder abd in S/L - 3x10  Manual Therapy - CPA mid and lower lumbar spine  TREATMENT 08/26/2021:  Therapeutic Exercise: - LTR - 20x - hip adduction squeeze -  5'' 2x10 - supine clam - YTB - 3x10 - small arc bridge - 3x10 - Sciatic nerve glide L - 20x - S/L shoulder ER - 3x15 - pain free arc shoulder abd in S/L - 3x10  Therapeutic Activity: - administering FOTO    ASSESSMENT:   CLINICAL IMPRESSION: Pt responded excellently to all interventions today, demonstrating improved pain with new exercises. Due to time constraints, the session focused primarily on pt's back pain. Plan will be to address shoulder primarily at next visit. Pt will continue to benefit from skilled PT to address his primary impairments and return to his prior level of function with less limitation.     OBJECTIVE IMPAIRMENTS Abnormal gait, decreased activity tolerance, decreased balance, decreased coordination, decreased endurance, decreased knowledge of use of DME, decreased mobility, difficulty walking, decreased ROM, decreased strength, hypomobility, increased edema, impaired flexibility, impaired sensation, impaired UE functional use, improper body mechanics, postural dysfunction, and pain.    ACTIVITY LIMITATIONS carrying, lifting, bending, sitting, standing, squatting, sleeping, stairs, transfers, bed mobility, reach over head, hygiene/grooming, and locomotion level   PARTICIPATION LIMITATIONS: cleaning, laundry, driving, shopping, community activity, occupation, and yard work   PERSONAL FACTORS Multiple treatment areas are also affecting patient's functional outcome.         GOALS: Goals reviewed with patient? Yes   SHORT TERM GOALS: Target date: 09/16/2021   Pt will  report understanding and adherence to initial HEP in order to promote independence in the management of primary impairments. Baseline: HEP provided at eval Goal status: INITIAL     LONG TERM GOALS: Target date: 10/14/2021   Pt will achieve a FOTO score meeting the predictive value in order to demonstrate improved functional ability as it relates to his primary impairments. Baseline: To be administered at first treatment Goal status: INITIAL   2.  Pt will achieve BIL global hip strength of 5/5 in order to progress his independent LE strengthening regimen with less limitation. Baseline: See MMT chart Goal status: INITIAL   3.  Pt will achieve WNL global lumbar AROM with 0-4/10 pain in order to get dressed with less limitation. Baseline: See AROM chart (>8/10 pain in all affected movements) Goal status: INITIAL   4.  Pt will achieve Rt shoulder elevation AROM of 150 degrees or higher in order to reach into overhead cabinets with less limitation. Baseline: Flexion: 100 degrees, abduction: 80 degrees Goal status: INITIAL   5.  Pt will report ability to sit > 30 minutes with 0-4/10 pain in order to go on car trips with less limitation. Baseline: >8/10 pain with 10 minutes of sitting Goal status: INITIAL       PLAN: PT FREQUENCY: 1x/week   PT DURATION: 8 weeks   PLANNED INTERVENTIONS: Therapeutic exercises, Therapeutic activity, Neuromuscular re-education, Balance training, Gait training, Patient/Family education, Self Care, Joint mobilization, Joint manipulation, Stair training, Orthotic/Fit training, DME instructions, Aquatic Therapy, Dry Needling, Electrical stimulation, Spinal manipulation, Spinal mobilization, Cryotherapy, Moist heat, Taping, Vasopneumatic device, Traction, Biofeedback, Ionotophoresis 4mg /ml Dexamethasone, Manual therapy, and Re-evaluation.   PLAN FOR NEXT SESSION: Further assess Lt hip, progress RTC/ core/ LE strengthening   , PT, DPT 09/09/21  10:00 AM

## 2021-09-11 ENCOUNTER — Other Ambulatory Visit: Payer: Self-pay | Admitting: Pharmacist

## 2021-09-11 ENCOUNTER — Other Ambulatory Visit: Payer: Self-pay | Admitting: Nurse Practitioner

## 2021-09-11 ENCOUNTER — Ambulatory Visit: Payer: Self-pay | Attending: Nurse Practitioner

## 2021-09-11 ENCOUNTER — Other Ambulatory Visit: Payer: Self-pay

## 2021-09-11 DIAGNOSIS — Z0000 Encounter for general adult medical examination without abnormal findings: Secondary | ICD-10-CM

## 2021-09-11 MED ORDER — POLYETHYLENE GLYCOL 3350 17 GM/SCOOP PO POWD
17.0000 g | Freq: Two times a day (BID) | ORAL | 0 refills | Status: DC
  Filled 2021-09-11 – 2021-09-22 (×2): qty 238, 7d supply, fill #0

## 2021-09-11 NOTE — Telephone Encounter (Signed)
reordered today 09/11/21  Requested Prescriptions  Refused Prescriptions Disp Refills  . polyethylene glycol powder (GLYCOLAX/MIRALAX) 17 GM/SCOOP powder 1020 g 2    Sig: Take 17 grams by mouth in the morning and at bedtime.     Gastroenterology:  Laxatives Passed - 09/11/2021  8:52 AM      Passed - Valid encounter within last 12 months    Recent Outpatient Visits          4 days ago Encounter for annual physical exam   [facility] Claiborne Rigg, NP   11 months ago Skin infection   [facility] [place], Marzella Schlein, [place]   1 year ago Encounter for annual physical exam   [facility] [place], Shea Stakes, NP   2 years ago Paresthesias   [facility] [place], [place], [place]   2 years ago HSV (herpes simplex virus) infection   [facility] [place], [place], [place]

## 2021-09-14 ENCOUNTER — Other Ambulatory Visit: Payer: Self-pay

## 2021-09-14 ENCOUNTER — Encounter: Payer: Self-pay | Admitting: Sports Medicine

## 2021-09-14 ENCOUNTER — Ambulatory Visit (INDEPENDENT_AMBULATORY_CARE_PROVIDER_SITE_OTHER): Payer: Self-pay | Admitting: Sports Medicine

## 2021-09-14 VITALS — BP 107/62 | HR 76 | Ht 65.0 in | Wt 176.0 lb

## 2021-09-14 DIAGNOSIS — W108XXA Fall (on) (from) other stairs and steps, initial encounter: Secondary | ICD-10-CM

## 2021-09-14 DIAGNOSIS — G8929 Other chronic pain: Secondary | ICD-10-CM

## 2021-09-14 DIAGNOSIS — M545 Low back pain, unspecified: Secondary | ICD-10-CM

## 2021-09-14 DIAGNOSIS — M542 Cervicalgia: Secondary | ICD-10-CM

## 2021-09-14 DIAGNOSIS — M792 Neuralgia and neuritis, unspecified: Secondary | ICD-10-CM

## 2021-09-14 DIAGNOSIS — M25511 Pain in right shoulder: Secondary | ICD-10-CM

## 2021-09-14 MED ORDER — AMITRIPTYLINE HCL 25 MG PO TABS
25.0000 mg | ORAL_TABLET | Freq: Every day | ORAL | 1 refills | Status: DC
  Filled 2021-09-14 – 2021-09-22 (×2): qty 30, 30d supply, fill #0
  Filled 2021-11-02: qty 30, 30d supply, fill #1

## 2021-09-14 NOTE — Patient Instructions (Signed)
-   Continue physical therapy - Begin Amitriptyline at bedtime - Follow-up with Dr. Shon Baton after MRI --> someone should be reaching out to you to call you this week to schedule

## 2021-09-14 NOTE — Progress Notes (Signed)
Office Visit Note   Patient: Gabriel Ball           Date of Birth: 03-30-1970           MRN: [id number] Visit Date: 09/14/2021              Requested by: Claiborne Rigg, NP [address] [place] [address] [place],  [place] [address] PCP: Claiborne Rigg, NP   Assessment & Plan: Visit Diagnoses:  1. Fall (on) (from) other stairs and steps, initial encounter   2. Neck pain   3. Radicular pain of right upper extremity   4. Chronic right shoulder pain   5. Chronic left-sided low back pain, unspecified whether sciatica present    Plan: I discussed with Gabriel Ball that he has quite extensive painful conditions.  It is very reassuring that he has had x-rays of the lumbar spine, shoulder as well as right rib cage that did not show any fracture or bony abnormality.  He has also had a CT scan of the cervical and the lumbar spine which were unremarkable other than the lumbar CT scan showing small disc herniations at L4-L5 and L5-S1.  However, he does not have specific dermatomal lower extremity radicular symptoms at this time.  His most bothersome pain is his radicular symptoms that shoot down from the right side of the neck into the right upper extremity in all 5 digits.  We will move forward with MRI of the cervical spine to rule out nerve involvement.  This could also be a brachial plexus stretching injury based on his mechanism.  Given his radiating leg symptoms of both the upper and lower extremity, we will discontinue his muscle relaxer and transition to amitriptyline 25 mg nightly (start at 12.5mg  x 1 week).  Discussed warning signs to be aware of.  Highly encouraged continuing formalized physical therapy, he may also benefit from soft tissue and/or massage therapy going forward.  He will follow-up after MRI.  Follow-Up Instructions: Return for Follow-up with Dr. Shon Baton after MRI.   Orders:  Orders Placed This Encounter  Procedures   MR Cervical Spine w/o contrast   Meds ordered this  encounter  Medications   amitriptyline (ELAVIL) 25 MG tablet    Sig: Take 1 tablet (25 mg total) by mouth at bedtime. Start with 1/2 tablet for one week. If tolerating well, may increase to 1 tablet qHs    Dispense:  30 tablet    Refill:  1   Subjective: Chief Complaint  Patient presents with   Right Shoulder - Pain    DOI 07/07/2021   Left Leg - Pain    DOI 07/07/2021    [name] is a 51 year-old male who presents with multiple painful conditions including the right neck and shoulder, right rib cage as well as left lower back and leg pain.  The use of an inpatient Spanish interpreter was used throughout the entirety of the visit today.  Patient reports he had a fall at work on July 07, 2021.  He reports this is not a workers comp injury at this point.  He fell from a height of about 3 feet and his left leg got caught in the ladder, he tried to grab himself on the door with his right arm and feels like he stretched the arm in the neck.  He ultimately ended up falling down.  He had extensive imaging including x-rays of the lumbar spine, right shoulder as well as right ribs  that did not show any evidence of acute fracture.  He also underwent CT of the cervical spine on 07/21/2021 which did not show any fracture or malalignment.  CT scan of the lumbar spine on 07/21/2021 which showed small disc herniations at L4-L5 and L5-S1.  The patient has had about 4 sessions of physical therapy and does note that his pain has slightly improved.  Average pain is a 7/10.  However when he tries to walk or do physical activity his pain will increase all throughout in all regions of the right neck and upper extremity, right lateral rib cage as well as left-sided low back and leg pain.  He denies any numbness or tingling in the legs.  His pain from the neck and shoulder does radiate down into all 5 of his fingers.  Denies any loss of grip strength.  Has been taking Tylenol without much relief.  His primary physician  prescribed a muscle relaxer for him to take but he denies feeling any relief from this.  Objective: Vital Signs: BP 107/62   Pulse 76   Ht 5\' 5"  (1.651 m)   Wt 176 lb (79.8 kg)   BMI 29.29 kg/m   Physical Exam Gen: Well-appearing, in no acute distress; non-toxic CV: Regular Rate. Well-perfused. Warm.  Resp: Breathing unlabored on room air; no wheezing. Psych: Fluid speech in conversation; appropriate affect; normal thought process Neuro: Sensation intact throughout. No gross coordination deficits.   Ortho Exam - Cervical spine: Patient has some restriction in pain with endrange extension as well as right rotation and side-bending.  There is some weakness with resisted abduction.  Positive Spurling's test on the right with reproduction of radicular pain on the right upper extremity.  - Right shoulder/Costal cage: Hypertonicity noted of the right trapezius and cervical paraspinal muscles.  He does have some tenderness to palpation over the lateral costal margin of the right rib cage from ribs 7-9, no bony step-off palpated. He does have some limitation in forward flexion and AB duction actively at about 130 degrees flexion, 95 degrees abduction.  He has a painful drop arm test on abduction.  No gross deficits with rotator cuff testing, although + impingement testing.  Grip strength equivocal bilaterally.  - Lumbar/Left hip: There is some pain to palpation over the left paraspinal muscles of the lumbar spine and the SI joint region.  Passive internal and external logroll negative.  He has pain in all directions at endrange flexion extension and circumduction.  Equivocal straight leg raise.  Full strength at the knee with flexion extension and ankle dorsiflexion/plantarflexion.  Imaging:  CT Lumbar Spine Wo Contrast CLINICAL DATA:  51 year old male status post fall last month. Left low back pain radiating to the left hip and leg.  EXAM: CT LUMBAR SPINE WITHOUT  CONTRAST  TECHNIQUE: Multidetector CT imaging of the lumbar spine was performed without intravenous contrast administration. Multiplanar CT image reconstructions were also generated.  RADIATION DOSE REDUCTION: This exam was performed according to the departmental dose-optimization program which includes automated exposure control, adjustment of the mA and/or kV according to patient size and/or use of iterative reconstruction technique.  COMPARISON:  CT Abdomen and Pelvis 03/07/2020.  FINDINGS: Segmentation: Normal.  Alignment: Stable lumbar lordosis.  No spondylolisthesis.  Vertebrae: Stable visualized osseous structures. Lumbar vertebrae, visible sacrum and SI joints appear stable and intact.  Paraspinal and other soft tissues: Negative visible noncontrast abdominal viscera except for faint distal aortic calcified atherosclerosis. Lumbar paraspinal soft tissues are within  normal limits.  Disc levels:  T12-L1:  Negative.  L1-L2:  Negative.  L2-L3:  Mild circumferential disc bulge.  No stenosis.  L3-L4:  Mild if any circumferential disc bulging.  No stenosis.  L4-L5: Evidence of a central disc protrusion (series 3, image 81) with underlying mild mostly far lateral disc bulging and endplate spurring. Up to mild facet hypertrophy. Mild if any associated spinal stenosis. Disc is probably in proximity to the descending L5 nerve roots in the lateral recesses and perhaps more so the left. No foraminal stenosis.  L5-S1: Here also a small left paracentral disc protrusion is suspected on series 3, image 93. Minimal underlying disc bulging. Mild facet hypertrophy. No spinal stenosis. Possible mild left lateral recess stenosis (left S1 nerve level). No foraminal involvement.  IMPRESSION: 1. No acute osseous abnormality in the lumbar spine. 2. CT evidence of small disc herniations at both L4-L5 and L5-S1. Mild if any associated spinal stenosis, but the discs might be a source  of L5 and/or S1 radiculitis.  Electronically Signed   By: Odessa Fleming M.D.   On: 07/21/2021 12:36 CT Cervical Spine Wo Contrast CLINICAL DATA:  Cervical radiculopathy  EXAM: CT CERVICAL SPINE WITHOUT CONTRAST  TECHNIQUE: Multidetector CT imaging of the cervical spine was performed without intravenous contrast. Multiplanar CT image reconstructions were also generated.  RADIATION DOSE REDUCTION: This exam was performed according to the departmental dose-optimization program which includes automated exposure control, adjustment of the mA and/or kV according to patient size and/or use of iterative reconstruction technique.  COMPARISON:  Cervical spine x-ray 06/08/2020  FINDINGS: Alignment: Normal.  Skull base and vertebrae: No acute fracture. No primary bone lesion or focal pathologic process.  Soft tissues and spinal canal: No prevertebral fluid or swelling. No visible canal hematoma.  Disc levels:  Intervertebral disc spaces are preserved.  Upper chest: No acute process identified.  Other: None.  IMPRESSION: No acute fracture or malalignment identified.  Electronically Signed   By: Jannifer Hick M.D.   On: 07/21/2021 12:27  *Independent review of the right shoulder x-ray from 07/14/2021 demonstrates 3 view including AP, Grashey and scapular Y views which show no significant arthritis or bony abnormality.  Humeral head is well located within the glenohumeral joint.  *Independent review of the lumbar spine films from 07/07/2021 show well-preserved disc spaces without evidence of fracture or acute bony abnormality.  Very slight loss of normal lumbar lordosis.   PMFS History: Patient Active Problem List   Diagnosis Date Noted   Acute bilateral low back pain without sciatica 06/05/2020   Acute pain of right shoulder 06/05/2020   Asthma due to environmental allergies 12/20/2016   HSV (herpes simplex virus) infection 02/24/2015   Anxiety 04/02/2014   Past Medical  History:  Diagnosis Date   Asthma    Herpes    Shingles     Family History  Problem Relation Age of Onset   Cancer Father     Past Surgical History:  Procedure Laterality Date   MOUTH SURGERY     Social History   Occupational History   Not on file  Tobacco Use   Smoking status: Former   Smokeless tobacco: Never  Vaping Use   Vaping Use: Never used  Substance and Sexual Activity   Alcohol use: No   Drug use: No   Sexual activity: Yes

## 2021-09-15 ENCOUNTER — Other Ambulatory Visit: Payer: Self-pay

## 2021-09-15 LAB — CMP14+EGFR
ALT: 30 IU/L (ref 0–44)
AST: 18 IU/L (ref 0–40)
Albumin/Globulin Ratio: 1.7 (ref 1.2–2.2)
Albumin: 4.4 g/dL (ref 4.1–5.1)
Alkaline Phosphatase: 72 IU/L (ref 44–121)
BUN/Creatinine Ratio: 14 (ref 9–20)
BUN: 10 mg/dL (ref 6–24)
Bilirubin Total: 0.4 mg/dL (ref 0.0–1.2)
CO2: 21 mmol/L (ref 20–29)
Calcium: 9.1 mg/dL (ref 8.7–10.2)
Chloride: 103 mmol/L (ref 96–106)
Creatinine, Ser: 0.69 mg/dL — ABNORMAL LOW (ref 0.76–1.27)
Globulin, Total: 2.6 g/dL (ref 1.5–4.5)
Glucose: 83 mg/dL (ref 70–99)
Potassium: 4.1 mmol/L (ref 3.5–5.2)
Sodium: 139 mmol/L (ref 134–144)
Total Protein: 7 g/dL (ref 6.0–8.5)
eGFR: 113 mL/min/{1.73_m2} (ref >59)

## 2021-09-15 LAB — CBC WITH DIFFERENTIAL/PLATELET
Basophils Absolute: 0 10*3/uL (ref 0.0–0.2)
Basos: 1 %
EOS (ABSOLUTE): 0.2 10*3/uL (ref 0.0–0.4)
Eos: 3 %
Hematocrit: 48 % (ref 37.5–51.0)
Hemoglobin: 16.1 g/dL (ref 13.0–17.7)
Immature Grans (Abs): 0 10*3/uL (ref 0.0–0.1)
Immature Granulocytes: 1 %
Lymphocytes Absolute: 1.8 10*3/uL (ref 0.7–3.1)
Lymphs: 30 %
MCH: 29.6 pg (ref 26.6–33.0)
MCHC: 33.5 g/dL (ref 31.5–35.7)
MCV: 88 fL (ref 79–97)
Monocytes Absolute: 0.4 10*3/uL (ref 0.1–0.9)
Monocytes: 6 %
Neutrophils Absolute: 3.5 10*3/uL (ref 1.4–7.0)
Neutrophils: 59 %
Platelets: 193 10*3/uL (ref 150–450)
RBC: 5.44 x10E6/uL (ref 4.14–5.80)
RDW: 13.5 % (ref 11.6–15.4)
WBC: 5.8 10*3/uL (ref 3.4–10.8)

## 2021-09-15 LAB — LIPID PANEL
Chol/HDL Ratio: 4.9 ratio (ref 0.0–5.0)
Cholesterol, Total: 171 mg/dL (ref 100–199)
HDL: 35 mg/dL — ABNORMAL LOW (ref >39)
LDL Chol Calc (NIH): 117 mg/dL — ABNORMAL HIGH (ref 0–99)
Triglycerides: 103 mg/dL (ref 0–149)
VLDL Cholesterol Cal: 19 mg/dL (ref 5–40)

## 2021-09-15 LAB — HEMOGLOBIN A1C
Est. average glucose Bld gHb Est-mCnc: 108 mg/dL
Hgb A1c MFr Bld: 5.4 % (ref 4.8–5.6)

## 2021-09-16 ENCOUNTER — Ambulatory Visit: Payer: Self-pay

## 2021-09-16 DIAGNOSIS — M25552 Pain in left hip: Secondary | ICD-10-CM

## 2021-09-16 DIAGNOSIS — G8929 Other chronic pain: Secondary | ICD-10-CM

## 2021-09-16 DIAGNOSIS — M6281 Muscle weakness (generalized): Secondary | ICD-10-CM

## 2021-09-16 DIAGNOSIS — M5459 Other low back pain: Secondary | ICD-10-CM

## 2021-09-16 DIAGNOSIS — R262 Difficulty in walking, not elsewhere classified: Secondary | ICD-10-CM

## 2021-09-16 NOTE — Therapy (Signed)
OUTPATIENT PHYSICAL THERAPY TREATMENT NOTE   Patient Name: Gabriel Ball MRN: [id number] DOB:1970/05/06, 51 y.o., male Today's Date: 09/16/2021  PCP: Claiborne Rigg, NP   REFERRING PROVIDER: Bethann Goo, DO   PT End of Session - 09/16/21 0911     Visit Number 5    Number of Visits 9    Date for PT Re-Evaluation 10/21/21    Authorization Type None    Authorization Time Period FOTO v6, v10    PT Start Time 0914    PT Stop Time 0956    PT Time Calculation (min) 42 min    Activity Tolerance Patient limited by pain    Behavior During Therapy [facility] for tasks assessed/performed                Past Medical History:  Diagnosis Date   Asthma    Herpes    Shingles    Past Surgical History:  Procedure Laterality Date   MOUTH SURGERY     Patient Active Problem List   Diagnosis Date Noted   Acute bilateral low back pain without sciatica 06/05/2020   Acute pain of right shoulder 06/05/2020   Asthma due to environmental allergies 12/20/2016   HSV (herpes simplex virus) infection 02/24/2015   Anxiety 04/02/2014    THERAPY DIAG:  Other low back pain  Muscle weakness (generalized)  Difficulty in walking, not elsewhere classified  Chronic right shoulder pain  Pain in left hip  REFERRING DIAG:  Z91.81 (ICD-10-CM) - History of falling  PERTINENT HISTORY: Fall from countertop on 07/07/2021  PRECAUTIONS/RESTRICTIONS:   none  SUBJECTIVE:  Pt reports his orthopedic appointment two days ago went well. He reports the doctor wanted to schedule him for a neck CT scan, although he could not schedule it due to being uninsured. Pt reports continued adherence to his HEP.   PAIN:  Are you having pain? Yes: NPRS scale: 7-8/10 Pain location: Rt shoulder, low back, Lt hip Pain description: sharp Aggravating factors: Shoulder: moving arm overhead, lifting any amount of weight; low back: prolonged sitting > 10 minutes. Relieving factors: pain medication/ laying  down  OBJECTIVE:  DIAGNOSTIC FINDINGS:    CT Lumbar Spine without contrast: IMPRESSION: 1. No acute osseous abnormality in the lumbar spine. 2. CT evidence of small disc herniations at both L4-L5 and L5-S1. Mild if any associated spinal stenosis, but the discs might be a source of L5 and/or S1 radiculitis.   07/14/2021: DG Shoulder RT: IMPRESSION: No evidence of fracture or dislocation.   PATIENT SURVEYS:  FOTO Assess at treatment one   SCREENING FOR RED FLAGS: Bowel or bladder incontinence: No Cauda equina syndrome: No   COGNITION:           Overall cognitive status: Within functional limits for tasks assessed                          SENSATION: Not tested     POSTURE: rounded shoulders, forward head, and decreased lumbar lordosis   PALPATION: TTP to Rt supraspinatus musculotendinous junction   LUMBAR ROM:    Active  A/PROM  eval AROM 09/16/2021  Flexion 50%, painful with referred pain down Lt leg 50%, pain  Extension 50%, pain 75%, decreased LE sxs  Right lateral flexion 50%, pain 75%  Left lateral flexion 50% 75%  Right rotation 50% WNL  Left rotation 50%, shoulder pain WNL   (Blank rows = not tested)   UPPER EXTREMITY ROM:  A/PROM Right 08/19/2021 Left 08/19/2021 Right 09/16/2021  Shoulder flexion 100p! 170 120p!/146p!  Shoulder abduction 90p! 160 120p!/140p!  Shoulder internal rotation 10p! 80 50/65p!  Shoulder external rotation 30p! 80 65p!/78p!  (Blank rows = not tested)   UPPER EXTREMITY MMT:   MMT Right 08/19/2021 Left 08/19/2021  Shoulder flexion 4/5p! 5/5  Shoulder abduction 4/5p! 5/5  Shoulder internal rotation 4+/5p! 5/5  Shoulder external rotation 4/5p! 5/5      LOWER EXTREMITY MMT:     MMT Right eval Left eval  Hip flexion 4/5 4/5p!  Hip extension 3/5 2+/5p!  Hip abduction 3/5p! 2/5p!  Knee flexion 5/5 4/5p!  Knee extension 5/5 4/5p!  Ankle dorsiflexion 5/5 4/5  Ankle plantarflexion 5/5 4/5   (Blank rows = not tested)    SPECIAL TESTS:  Hawkins-Kennedy: (+) on Rt Neer's: (+) on Rt ERLS: (-) on Rt Slump: (+) on Lt SLR: (+) on Lt Cross-leg active SLR: (+) on Lt for LBP     FUNCTIONAL TESTS:  5xSTS with UE support: 50 seconds with Lt hip/ knee pain Squat: Not tested Plank: Not tested     09/16/2021: 5xSTS: 20 seconds   TODAY'S TREATMENT  None due to time constraints     PATIENT EDUCATION:  Education details: Pt educated about probable underlying pathophysiology, POC, prognosis, FOTO, and HEP Person educated: Patient Education method: Explanation, Demonstration, and Handouts Education comprehension: verbalized understanding and returned demonstration     HOME EXERCISE PROGRAM: Access Code: [email or web address] URL: [email or web address] Date: 08/26/2021 Prepared by: Alphonzo Severance  Exercises - Supine Lower Trunk Rotation  - 1 x daily - 7 x weekly - 1 sets - 20 reps - 3 hold - Supine Hip Adduction Isometric with Ball  - 1 x daily - 7 x weekly - 2 sets - 10 reps - 10'' hold - Hooklying Clamshell with Resistance  - 1 x daily - 7 x weekly - 3 sets - 10 reps - Sidelying Shoulder External Rotation  - 1 x daily - 7 x weekly - 3 sets - 10 reps  Added 09/09/2021: - Seated Slump Nerve Glide  - 1 x daily - 7 x weekly - 3 sets - 20 reps - Plank on Knees  - 1 x daily - 7 x weekly - 3 sets - 30 segundos hold  Added 09/16/2021: - Shoulder External Rotation and Scapular Retraction with Resistance  - 1 x daily - 7 x weekly - 3 sets - 8 reps - 3 seconds hold - Shoulder Scaption AAROM with Dowel  - 1 x daily - 7 x weekly - 2 sets - 10 reps - 5 seconds hold  [facility] Adult PT Treatment:                                                DATE: 09/16/2021 Therapeutic Exercise: Supine 90/90 bicycles with handhold resistance to thighs 2x10 Sidelying lumbar open books x10 BIL Seated BIL shoulder ER with scapular retraction with green TB 3x8 Standing BIL shoulder flexion AAROM with black physioball at wall with PT  perturbations at end range 3x30 seconds Standing Shoulder V lifts with black physioball 3x10  Manual Therapy: N/A Neuromuscular re-ed: N/A Therapeutic Activity: Re-assessment of objective measures with pt education Modalities: N/A Self Care: N/A   [facility] Adult PT Treatment:  DATE: 09/09/2021 Therapeutic Exercise: Seated sciatic nerve glides 2x20 Knee planks 3x30 seconds Supine 90/90 abdominal isometric with handhold resistance 3x30 seconds Sidelying lumbar open book x10 BIL Side knee plank 2x10 BIL Manual Therapy: N/A Neuromuscular re-ed: N/A Therapeutic Activity: Detailed pt education concerning probable underlying pathophysiology leading to his pain presentation, including visual representation utilizing a spinal skeletal model Modalities: N/A Self Care: N/A   TREATMENT 09/02/2021:  Therapeutic Exercise: - nu-step L5 38m while taking subjective and planning session with patient - LTR - 20x - hip adduction squeeze - 5'' 2x10 - supine alternating clam - RTB - 3x10 - bridge - 2x10 - Sciatic nerve glide L - 20x - S/L shoulder ER - 3x15 - pain free arc shoulder abd in S/L - 3x10  Manual Therapy - CPA mid and lower lumbar spine    ASSESSMENT:   CLINICAL IMPRESSION: Pt responded very well to progressed core and RTC strengthening exercises today, reporting a therapeutic response to activity. Upon re-assessment of objective measures, the pt has made excellent progress in global lumbar AROM, global Rt shoulder AROM, and 5xSTS. He will continue to benefit from skilled PT to address his primary impairments and return to his prior level of function with less limitation.     OBJECTIVE IMPAIRMENTS Abnormal gait, decreased activity tolerance, decreased balance, decreased coordination, decreased endurance, decreased knowledge of use of DME, decreased mobility, difficulty walking, decreased ROM, decreased strength, hypomobility,  increased edema, impaired flexibility, impaired sensation, impaired UE functional use, improper body mechanics, postural dysfunction, and pain.    ACTIVITY LIMITATIONS carrying, lifting, bending, sitting, standing, squatting, sleeping, stairs, transfers, bed mobility, reach over head, hygiene/grooming, and locomotion level   PARTICIPATION LIMITATIONS: cleaning, laundry, driving, shopping, community activity, occupation, and yard work   PERSONAL FACTORS Multiple treatment areas are also affecting patient's functional outcome.         GOALS: Goals reviewed with patient? Yes   SHORT TERM GOALS: Target date: 09/16/2021   Pt will report understanding and adherence to initial HEP in order to promote independence in the management of primary impairments. Baseline: HEP provided at eval 09/16/2021: Pt reports adherence to his HEP Goal status: ACHIEVED     LONG TERM GOALS: Target date: 10/14/2021   Pt will achieve a FOTO score meeting the predictive value in order to demonstrate improved functional ability as it relates to his primary impairments. Baseline: To be administered at first treatment Goal status: INITIAL   2.  Pt will achieve BIL global hip strength of 5/5 in order to progress his independent LE strengthening regimen with less limitation. Baseline: See MMT chart Goal status: INITIAL   3.  Pt will achieve WNL global lumbar AROM with 0-4/10 pain in order to get dressed with less limitation. Baseline: See AROM chart (>8/10 pain in all affected movements) 09/16/2021: See updated AROM chart Goal status: IN PROGRESS   4.  Pt will achieve Rt shoulder elevation AROM of 150 degrees or higher in order to reach into overhead cabinets with less limitation. Baseline: Flexion: 100 degrees, abduction: 80 degrees 09/16/2021: 120 degrees in flexion and abduction Goal status: IN PROGRESS   5.  Pt will report ability to sit > 30 minutes with 0-4/10 pain in order to go on car trips with less  limitation. Baseline: >8/10 pain with 10 minutes of sitting Goal status: INITIAL       PLAN: PT FREQUENCY: 1x/week   PT DURATION: 8 weeks   PLANNED INTERVENTIONS: Therapeutic exercises, Therapeutic activity, Neuromuscular re-education, Balance training,  Gait training, Patient/Family education, Self Care, Joint mobilization, Joint manipulation, Stair training, Orthotic/Fit training, DME instructions, Aquatic Therapy, Dry Needling, Electrical stimulation, Spinal manipulation, Spinal mobilization, Cryotherapy, Moist heat, Taping, Vasopneumatic device, Traction, Biofeedback, Ionotophoresis 4mg /ml Dexamethasone, Manual therapy, and Re-evaluation.   PLAN FOR NEXT SESSION: Further assess Lt hip, progress RTC/ core/ LE strengthening   , PT, DPT 09/16/21 10:19 AM

## 2021-09-18 ENCOUNTER — Other Ambulatory Visit: Payer: Self-pay

## 2021-09-22 ENCOUNTER — Other Ambulatory Visit: Payer: Self-pay

## 2021-09-23 ENCOUNTER — Encounter: Payer: Self-pay | Admitting: Physical Therapy

## 2021-09-23 ENCOUNTER — Ambulatory Visit: Payer: Self-pay | Admitting: Physical Therapy

## 2021-09-23 ENCOUNTER — Ambulatory Visit: Payer: Self-pay | Attending: Neurological Surgery | Admitting: Physical Therapy

## 2021-09-23 ENCOUNTER — Telehealth: Payer: Self-pay | Admitting: Emergency Medicine

## 2021-09-23 DIAGNOSIS — M6281 Muscle weakness (generalized): Secondary | ICD-10-CM | POA: Insufficient documentation

## 2021-09-23 DIAGNOSIS — M5459 Other low back pain: Secondary | ICD-10-CM | POA: Insufficient documentation

## 2021-09-23 DIAGNOSIS — M25511 Pain in right shoulder: Secondary | ICD-10-CM | POA: Insufficient documentation

## 2021-09-23 DIAGNOSIS — R262 Difficulty in walking, not elsewhere classified: Secondary | ICD-10-CM | POA: Insufficient documentation

## 2021-09-23 DIAGNOSIS — G8929 Other chronic pain: Secondary | ICD-10-CM | POA: Insufficient documentation

## 2021-09-23 DIAGNOSIS — M25552 Pain in left hip: Secondary | ICD-10-CM | POA: Insufficient documentation

## 2021-09-23 NOTE — Therapy (Signed)
OUTPATIENT PHYSICAL THERAPY TREATMENT NOTE   Patient Name: Gabriel Ball MRN: [id number] DOB:[birth year], 51 y.o., male Today's Date: 09/24/2021  PCP: Claiborne Rigg, NP   REFERRING PROVIDER: Dawley, Alan Mulder, DO   PT End of Session - 09/23/21 1744     Visit Number 6    Number of Visits 9    Date for PT Re-Evaluation 10/21/21    Authorization Type None    Authorization Time Period FOTO v6, v10    PT Start Time 1745    PT Stop Time 1826    PT Time Calculation (min) 41 min    Activity Tolerance Patient limited by pain    Behavior During Therapy [facility] for tasks assessed/performed                Past Medical History:  Diagnosis Date   Asthma    Herpes    Shingles    Past Surgical History:  Procedure Laterality Date   MOUTH SURGERY     Patient Active Problem List   Diagnosis Date Noted   Acute bilateral low back pain without sciatica 06/05/2020   Acute pain of right shoulder 06/05/2020   Asthma due to environmental allergies 12/20/2016   HSV (herpes simplex virus) infection 02/24/2015   Anxiety 04/02/2014    THERAPY DIAG:  Other low back pain  Muscle weakness (generalized)  Difficulty in walking, not elsewhere classified  Chronic right shoulder pain  REFERRING DIAG:  Z91.81 (ICD-10-CM) - History of falling  PERTINENT HISTORY: Fall from countertop on 07/07/2021  PRECAUTIONS/RESTRICTIONS:   none  SUBJECTIVE:  Pt reports he his waiting on getting approval for an MRI or CT for his shoulder.  PAIN:  Are you having pain? Yes: NPRS scale: 7-8/10 Pain location: Rt shoulder, low back, Lt hip Pain description: sharp Aggravating factors: Shoulder: moving arm overhead, lifting any amount of weight; low back: prolonged sitting > 10 minutes. Relieving factors: pain medication/ laying down  OBJECTIVE:  DIAGNOSTIC FINDINGS:    CT Lumbar Spine without contrast: IMPRESSION: 1. No acute osseous abnormality in the lumbar spine. 2. CT evidence of small  disc herniations at both L4-L5 and L5-S1. Mild if any associated spinal stenosis, but the discs might be a source of L5 and/or S1 radiculitis.   07/14/2021: DG Shoulder RT: IMPRESSION: No evidence of fracture or dislocation.   PATIENT SURVEYS:  FOTO Assess at treatment one   SCREENING FOR RED FLAGS: Bowel or bladder incontinence: No Cauda equina syndrome: No   COGNITION:           Overall cognitive status: Within functional limits for tasks assessed                          SENSATION: Not tested     POSTURE: rounded shoulders, forward head, and decreased lumbar lordosis   PALPATION: TTP to Rt supraspinatus musculotendinous junction   LUMBAR ROM:    Active  A/PROM  eval AROM 09/16/2021  Flexion 50%, painful with referred pain down Lt leg 50%, pain  Extension 50%, pain 75%, decreased LE sxs  Right lateral flexion 50%, pain 75%  Left lateral flexion 50% 75%  Right rotation 50% WNL  Left rotation 50%, shoulder pain WNL   (Blank rows = not tested)   UPPER EXTREMITY ROM:    A/PROM Right 08/19/2021 Left 08/19/2021 Right 09/16/2021  Shoulder flexion 100p! 170 120p!/146p!  Shoulder abduction 90p! 160 120p!/140p!  Shoulder internal rotation 10p! 80 50/65p!  Shoulder  external rotation 30p! 80 65p!/78p!  (Blank rows = not tested)   UPPER EXTREMITY MMT:   MMT Right 08/19/2021 Left 08/19/2021  Shoulder flexion 4/5p! 5/5  Shoulder abduction 4/5p! 5/5  Shoulder internal rotation 4+/5p! 5/5  Shoulder external rotation 4/5p! 5/5      LOWER EXTREMITY MMT:     MMT Right eval Left eval  Hip flexion 4/5 4/5p!  Hip extension 3/5 2+/5p!  Hip abduction 3/5p! 2/5p!  Knee flexion 5/5 4/5p!  Knee extension 5/5 4/5p!  Ankle dorsiflexion 5/5 4/5  Ankle plantarflexion 5/5 4/5   (Blank rows = not tested)   SPECIAL TESTS:  Hawkins-Kennedy: (+) on Rt Neer's: (+) on Rt ERLS: (-) on Rt Slump: (+) on Lt SLR: (+) on Lt Cross-leg active SLR: (+) on Lt for LBP     FUNCTIONAL TESTS:   5xSTS with UE support: 50 seconds with Lt hip/ knee pain Squat: Not tested Plank: Not tested     09/16/2021: 5xSTS: 20 seconds   TODAY'S TREATMENT  None due to time constraints     PATIENT EDUCATION:  Education details: Pt educated about probable underlying pathophysiology, POC, prognosis, FOTO, and HEP Person educated: Patient Education method: Explanation, Demonstration, and Handouts Education comprehension: verbalized understanding and returned demonstration     HOME EXERCISE PROGRAM: Access Code: [email or web address] URL: [email or web address] Date: 08/26/2021 Prepared by: Alphonzo Severance  Exercises - Supine Lower Trunk Rotation  - 1 x daily - 7 x weekly - 1 sets - 20 reps - 3 hold - Supine Hip Adduction Isometric with Ball  - 1 x daily - 7 x weekly - 2 sets - 10 reps - 10'' hold - Hooklying Clamshell with Resistance  - 1 x daily - 7 x weekly - 3 sets - 10 reps - Sidelying Shoulder External Rotation  - 1 x daily - 7 x weekly - 3 sets - 10 reps  Added 09/09/2021: - Seated Slump Nerve Glide  - 1 x daily - 7 x weekly - 3 sets - 20 reps - Plank on Knees  - 1 x daily - 7 x weekly - 3 sets - 30 segundos hold  Added 09/16/2021: - Shoulder External Rotation and Scapular Retraction with Resistance  - 1 x daily - 7 x weekly - 3 sets - 8 reps - 3 seconds hold - Shoulder Scaption AAROM with Dowel  - 1 x daily - 7 x weekly - 2 sets - 10 reps - 5 seconds hold   TREATMENT 09/23/2021:  Therapeutic Exercise: - nu-step L5 62m while taking subjective and planning session with patient - LTR - 20x - hip adduction squeeze - 5'' 2x10 - supine alternating clam - Black TB - 3x10 - bridge - 2x10 - w/ blue TB - Sciatic nerve glide L - 20x - S/L shoulder ER - 3x15 - pain free arc shoulder abd in S/L - 3x10  [place]/HM: - discussing possible shoulder pathology and possible benefits of imaging - discouraging extended shoulder stretching OH which pt reports causes hand numbness  [facility] Adult PT  Treatment:                                                DATE: 09/16/2021 Therapeutic Exercise: Supine 90/90 bicycles with handhold resistance to thighs 2x10 Sidelying lumbar open books x10 BIL Seated BIL shoulder ER with scapular retraction with green TB 3x8  Standing BIL shoulder flexion AAROM with black physioball at wall with PT perturbations at end range 3x30 seconds Standing Shoulder V lifts with black physioball 3x10  Manual Therapy: N/A Neuromuscular re-ed: N/A Therapeutic Activity: Re-assessment of objective measures with pt education Modalities: N/A Self Care: N/A   [facility] Adult PT Treatment:                                                DATE: 09/09/2021 Therapeutic Exercise: Seated sciatic nerve glides 2x20 Knee planks 3x30 seconds Supine 90/90 abdominal isometric with handhold resistance 3x30 seconds Sidelying lumbar open book x10 BIL Side knee plank 2x10 BIL Manual Therapy: N/A Neuromuscular re-ed: N/A Therapeutic Activity: Detailed pt education concerning probable underlying pathophysiology leading to his pain presentation, including visual representation utilizing a spinal skeletal model Modalities: N/A Self Care: N/A   TREATMENT 09/02/2021:  Therapeutic Exercise: - nu-step L5 17m while taking subjective and planning session with patient - LTR - 20x - hip adduction squeeze - 5'' 2x10 - supine alternating clam - RTB - 3x10 - bridge - 2x10 - Sciatic nerve glide L - 20x - S/L shoulder ER - 3x15 - pain free arc shoulder abd in S/L - 3x10  Manual Therapy - CPA mid and lower lumbar spine    ASSESSMENT:   CLINICAL IMPRESSION: Rubens remains limited significantly by pain.  His imaging for back/neck/shoulder has been delayed because he is unable to pay for these out of pocket.  I encouraged him to pursue this further as imaging seems warrented given his level of pain, deficits, and time since injury.   OBJECTIVE IMPAIRMENTS Abnormal gait, decreased activity  tolerance, decreased balance, decreased coordination, decreased endurance, decreased knowledge of use of DME, decreased mobility, difficulty walking, decreased ROM, decreased strength, hypomobility, increased edema, impaired flexibility, impaired sensation, impaired UE functional use, improper body mechanics, postural dysfunction, and pain.    ACTIVITY LIMITATIONS carrying, lifting, bending, sitting, standing, squatting, sleeping, stairs, transfers, bed mobility, reach over head, hygiene/grooming, and locomotion level   PARTICIPATION LIMITATIONS: cleaning, laundry, driving, shopping, community activity, occupation, and yard work   PERSONAL FACTORS Multiple treatment areas are also affecting patient's functional outcome.         GOALS: Goals reviewed with patient? Yes   SHORT TERM GOALS: Target date: 09/16/2021   Pt will report understanding and adherence to initial HEP in order to promote independence in the management of primary impairments. Baseline: HEP provided at eval 09/16/2021: Pt reports adherence to his HEP Goal status: ACHIEVED     LONG TERM GOALS: Target date: 10/14/2021   Pt will achieve a FOTO score meeting the predictive value in order to demonstrate improved functional ability as it relates to his primary impairments. Baseline: To be administered at first treatment Goal status: INITIAL   2.  Pt will achieve BIL global hip strength of 5/5 in order to progress his independent LE strengthening regimen with less limitation. Baseline: See MMT chart Goal status: INITIAL   3.  Pt will achieve WNL global lumbar AROM with 0-4/10 pain in order to get dressed with less limitation. Baseline: See AROM chart (>8/10 pain in all affected movements) 09/16/2021: See updated AROM chart Goal status: IN PROGRESS   4.  Pt will achieve Rt shoulder elevation AROM of 150 degrees or higher in order to reach into overhead cabinets with less limitation. Baseline: Flexion:  100 degrees, abduction:  80 degrees 09/16/2021: 120 degrees in flexion and abduction Goal status: IN PROGRESS   5.  Pt will report ability to sit > 30 minutes with 0-4/10 pain in order to go on car trips with less limitation. Baseline: >8/10 pain with 10 minutes of sitting Goal status: INITIAL       PLAN: PT FREQUENCY: 1x/week   PT DURATION: 8 weeks   PLANNED INTERVENTIONS: Therapeutic exercises, Therapeutic activity, Neuromuscular re-education, Balance training, Gait training, Patient/Family education, Self Care, Joint mobilization, Joint manipulation, Stair training, Orthotic/Fit training, DME instructions, Aquatic Therapy, Dry Needling, Electrical stimulation, Spinal manipulation, Spinal mobilization, Cryotherapy, Moist heat, Taping, Vasopneumatic device, Traction, Biofeedback, Ionotophoresis 4mg /ml Dexamethasone, Manual therapy, and Re-evaluation.   PLAN FOR NEXT SESSION: Further assess Lt hip, progress RTC/ core/ LE strengthening   Kelee Cunningham PT 09/24/21 7:29 AM

## 2021-09-23 NOTE — Telephone Encounter (Signed)
Copied from CRM [id number]. Topic: General - Other >> Sep 18, 2021  4:48 PM Ja-Kwan M wrote: Reason for CRM: Pt requests call back to go over most recent lab results. Pt requests Spanish interpreter

## 2021-09-24 NOTE — Telephone Encounter (Signed)
Pt aware of results and voiced understanding. 

## 2021-09-29 ENCOUNTER — Encounter: Payer: Self-pay | Admitting: Sports Medicine

## 2021-09-30 ENCOUNTER — Ambulatory Visit: Payer: Self-pay | Admitting: Physical Therapy

## 2021-10-02 ENCOUNTER — Encounter (HOSPITAL_COMMUNITY): Payer: Self-pay | Admitting: *Deleted

## 2021-10-02 ENCOUNTER — Ambulatory Visit: Payer: Self-pay | Admitting: Physical Therapy

## 2021-10-02 ENCOUNTER — Encounter: Payer: Self-pay | Admitting: Physical Therapy

## 2021-10-02 ENCOUNTER — Ambulatory Visit (HOSPITAL_COMMUNITY)
Admission: EM | Admit: 2021-10-02 | Discharge: 2021-10-02 | Disposition: A | Discharge status: Home / Self Care | Payer: Self-pay

## 2021-10-02 DIAGNOSIS — M6281 Muscle weakness (generalized): Secondary | ICD-10-CM

## 2021-10-02 DIAGNOSIS — Z789 Other specified health status: Secondary | ICD-10-CM

## 2021-10-02 DIAGNOSIS — M5459 Other low back pain: Secondary | ICD-10-CM

## 2021-10-02 DIAGNOSIS — M25552 Pain in left hip: Secondary | ICD-10-CM

## 2021-10-02 DIAGNOSIS — G8929 Other chronic pain: Secondary | ICD-10-CM

## 2021-10-02 DIAGNOSIS — R262 Difficulty in walking, not elsewhere classified: Secondary | ICD-10-CM

## 2021-10-02 DIAGNOSIS — F419 Anxiety disorder, unspecified: Secondary | ICD-10-CM

## 2021-10-02 MED ORDER — HYDROXYZINE HCL 25 MG PO TABS
25.0000 mg | ORAL_TABLET | Freq: Two times a day (BID) | ORAL | 0 refills | Status: DC | PRN
  Filled 2021-10-02: qty 20, 10d supply, fill #0

## 2021-10-02 MED ORDER — HYDROXYZINE HCL 25 MG PO TABS: 25.0000 mg | ORAL_TABLET | Freq: Two times a day (BID) | ORAL | 0 refills | Status: DC | PRN
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
## 2021-10-02 NOTE — Therapy (Signed)
PHYSICAL THERAPY DISCHARGE SUMMARY  Visits from Start of Care: 7  Current functional level related to goals / functional outcomes: See assessment/goals   Remaining deficits: See assessment/goals   Education / Equipment: HEP and D/C plans  Patient agrees to discharge. Patient goals were not met. Patient is being discharged due to lack of progress.   Patient Name: Gabriel Ball MRN: [id number] DOB:1970/03/16, 51 y.o., male Today's Date: 10/02/2021  PCP: Gildardo Pounds, NP   REFERRING PROVIDER: Karsten Ro, DO   PT End of Session - 10/02/21 0911     Visit Number 7    Number of Visits 9    Date for PT Re-Evaluation 10/21/21    Authorization Type None    Authorization Time Period FOTO v6, v10    PT Start Time 0915    PT Stop Time 0956    PT Time Calculation (min) 41 min    Activity Tolerance Patient limited by pain    Behavior During Therapy [facility] for tasks assessed/performed                Past Medical History:  Diagnosis Date   Asthma    Herpes    Shingles    Past Surgical History:  Procedure Laterality Date   MOUTH SURGERY     Patient Active Problem List   Diagnosis Date Noted   Acute bilateral low back pain without sciatica 06/05/2020   Acute pain of right shoulder 06/05/2020   Asthma due to environmental allergies 12/20/2016   HSV (herpes simplex virus) infection 02/24/2015   Anxiety 04/02/2014    THERAPY DIAG:  Other low back pain  Muscle weakness (generalized)  Difficulty in walking, not elsewhere classified  Chronic right shoulder pain  Pain in left hip  REFERRING DIAG:  Z91.81 (ICD-10-CM) - History of falling  PERTINENT HISTORY: Fall from [place] on 07/07/2021  PRECAUTIONS/RESTRICTIONS:   none  SUBJECTIVE:  Pt reports he his waiting on getting approval for an MRI or CT for his shoulder.  PAIN:  Are you having pain? Yes: NPRS scale: 7-8/10 Pain location: Rt shoulder, low back, Lt hip Pain description:  sharp Aggravating factors: Shoulder: moving arm overhead, lifting any amount of weight; low back: prolonged sitting > 10 minutes. Relieving factors: pain medication/ laying down  OBJECTIVE:  DIAGNOSTIC FINDINGS:    CT Lumbar Spine without contrast: IMPRESSION: 1. No acute osseous abnormality in the lumbar spine. 2. CT evidence of small disc herniations at both L4-L5 and L5-S1. Mild if any associated spinal stenosis, but the discs might be a source of L5 and/or S1 radiculitis.   07/14/2021: DG Shoulder RT: IMPRESSION: No evidence of fracture or dislocation.   PATIENT SURVEYS:  FOTO Assess at treatment one   SCREENING FOR RED FLAGS: Bowel or bladder incontinence: No Cauda equina syndrome: No   COGNITION:           Overall cognitive status: Within functional limits for tasks assessed                          SENSATION: Not tested     POSTURE: rounded shoulders, forward head, and decreased lumbar lordosis   PALPATION: TTP to Rt supraspinatus musculotendinous junction   LUMBAR ROM:    Active  A/PROM  eval AROM 09/16/2021  Flexion 50%, painful with referred pain down Lt leg 50%, pain  Extension 50%, pain 75%, decreased LE sxs  Right lateral flexion 50%, pain 75%  Left lateral flexion  50% 75%  Right rotation 50% WNL  Left rotation 50%, shoulder pain WNL   (Blank rows = not tested)   UPPER EXTREMITY ROM:    A/PROM Right 08/19/2021 Left 08/19/2021 Right 09/16/2021  Shoulder flexion 100p! 170 120p!/146p!  Shoulder abduction 90p! 160 120p!/140p!  Shoulder internal rotation 10p! 80 50/65p!  Shoulder external rotation 30p! 80 65p!/78p!  (Blank rows = not tested)   UPPER EXTREMITY MMT:   MMT Right 08/19/2021 Left 08/19/2021  Shoulder flexion 4/5p! 5/5  Shoulder abduction 4/5p! 5/5  Shoulder internal rotation 4+/5p! 5/5  Shoulder external rotation 4/5p! 5/5      LOWER EXTREMITY MMT:     MMT Right eval Left eval L 9/15  Hip flexion 4/5 4/5p!   Hip extension 3/5  2+/5p! 2+ w/ P!  Hip abduction 3/5p! 2/5p! 2- w/ P!  Knee flexion 5/5 4/5p!   Knee extension 5/5 4/5p!   Ankle dorsiflexion 5/5 4/5   Ankle plantarflexion 5/5 4/5    (Blank rows = not tested)   SPECIAL TESTS:  Hawkins-Kennedy: (+) on Rt Neer's: (+) on Rt ERLS: (-) on Rt Slump: (+) on Lt SLR: (+) on Lt Cross-leg active SLR: (+) on Lt for LBP     FUNCTIONAL TESTS:  5xSTS with UE support: 50 seconds with Lt hip/ knee pain Squat: Not tested Plank: Not tested     09/16/2021: 5xSTS: 20 seconds   TODAY'S TREATMENT  None due to time constraints     PATIENT EDUCATION:  Education details: Pt educated about probable underlying pathophysiology, POC, prognosis, FOTO, and HEP Person educated: Patient Education method: Explanation, Demonstration, and Handouts Education comprehension: verbalized understanding and returned demonstration     HOME EXERCISE PROGRAM: Access Code: [email or web address] URL: [email or web address] Date: 08/26/2021 Prepared by: Shearon Balo  Exercises - Supine Lower Trunk Rotation  - 1 x daily - 7 x weekly - 1 sets - 20 reps - 3 hold - Supine Hip Adduction Isometric with Ball  - 1 x daily - 7 x weekly - 2 sets - 10 reps - 10'' hold - Hooklying Clamshell with Resistance  - 1 x daily - 7 x weekly - 3 sets - 10 reps - Sidelying Shoulder External Rotation  - 1 x daily - 7 x weekly - 3 sets - 10 reps  Added 09/09/2021: - Seated Slump Nerve Glide  - 1 x daily - 7 x weekly - 3 sets - 20 reps - Plank on Knees  - 1 x daily - 7 x weekly - 3 sets - 30 segundos hold  Added 09/16/2021: - Shoulder External Rotation and Scapular Retraction with Resistance  - 1 x daily - 7 x weekly - 3 sets - 8 reps - 3 seconds hold - Shoulder Scaption AAROM with Dowel  - 1 x daily - 7 x weekly - 2 sets - 10 reps - 5 seconds hold   TREATMENT 10/02/2021:  Therapeutic Exercise: - nu-step L5 59mwhile taking subjective and planning session with patient - LTR - 20x - hip adduction  squeeze - 5'' 3x10 - supine alternating clam - Black TB - 3x10 - bridge - 3x10 - w/ black TB - Sciatic nerve glide L - 20x - S/L shoulder ER - 3x15 - Pain free shoulder elevation in supine - 3x10 - min painful arc - pain free arc shoulder abd in S/L - 3x10  Therapeutic Activity - collecting information for goals, checking progress, and reviewing with patient  TREATMENT 09/23/2021:  Therapeutic Exercise: - nu-step L5  33mwhile taking subjective and planning session with patient - LTR - 20x - hip adduction squeeze - 5'' 2x10 - supine alternating clam - Black TB - 3x10 - bridge - 2x10 - w/ blue TB - Sciatic nerve glide L - 20x - S/L shoulder ER - 3x15 - pain free arc shoulder abd in S/L - 3x10  [place]/HM: - discussing possible shoulder pathology and possible benefits of imaging - discouraging extended shoulder stretching OH which pt reports causes hand numbness  [facility] Adult PT Treatment:                                                DATE: 09/16/2021 Therapeutic Exercise: Supine 90/90 bicycles with handhold resistance to thighs 2x10 Sidelying lumbar open books x10 BIL Seated BIL shoulder ER with scapular retraction with green TB 3x8 Standing BIL shoulder flexion AAROM with black physioball at wall with PT perturbations at end range 3x30 seconds Standing Shoulder V lifts with black physioball 3x10  Manual Therapy: N/A Neuromuscular re-ed: N/A Therapeutic Activity: Re-assessment of objective measures with pt education Modalities: N/A Self Care: N/A   [facility] Adult PT Treatment:                                                DATE: 09/09/2021 Therapeutic Exercise: Seated sciatic nerve glides 2x20 Knee planks 3x30 seconds Supine 90/90 abdominal isometric with handhold resistance 3x30 seconds Sidelying lumbar open book x10 BIL Side knee plank 2x10 BIL Manual Therapy: N/A Neuromuscular re-ed: N/A Therapeutic Activity: Detailed pt education concerning probable underlying  pathophysiology leading to his pain presentation, including visual representation utilizing a spinal skeletal model Modalities: N/A Self Care: N/A   TREATMENT 09/02/2021:  Therapeutic Exercise: - nu-step L5 582mhile taking subjective and planning session with patient - LTR - 20x - hip adduction squeeze - 5'' 2x10 - supine alternating clam - RTB - 3x10 - bridge - 2x10 - Sciatic nerve glide L - 20x - S/L shoulder ER - 3x15 - pain free arc shoulder abd in S/L - 3x10  Manual Therapy - CPA mid and lower lumbar spine    ASSESSMENT:   CLINICAL IMPRESSION: JoJaki Hammerschmidtas progressed poorly with therapy.  Improved impairments include: shoulder ROM.  Functional improvements include: minimal improvement.  Progressions needed include: Continued work at home with HEP.  Barriers to progress include: JoTulioad a traumatic injury at work and d/t a hold up with insurance has not had imaging of his neck, shoulder, or hip;  I believe given his MOI imaging is warranted before continuing PT as minimal progress has been made over the last several visits and pt is capable of completing his exercises at home independently  Please see GOALS section for progress on short term and long term goals established at evaluation.  I recommend D/C home with HEP; pt agrees with plan.   OBJECTIVE IMPAIRMENTS Abnormal gait, decreased activity tolerance, decreased balance, decreased coordination, decreased endurance, decreased knowledge of use of DME, decreased mobility, difficulty walking, decreased ROM, decreased strength, hypomobility, increased edema, impaired flexibility, impaired sensation, impaired UE functional use, improper body mechanics, postural dysfunction, and pain.    ACTIVITY LIMITATIONS carrying, lifting, bending, sitting, standing, squatting, sleeping, stairs, transfers, bed mobility,  reach over head, hygiene/grooming, and locomotion level   PARTICIPATION LIMITATIONS: cleaning, laundry, driving,  shopping, community activity, occupation, and yard work   PERSONAL FACTORS Multiple treatment areas are also affecting patient's functional outcome.         GOALS: Goals reviewed with patient? Yes   SHORT TERM GOALS: Target date: 09/16/2021   Pt will report understanding and adherence to initial HEP in order to promote independence in the management of primary impairments. Baseline: HEP provided at eval 09/16/2021: Pt reports adherence to his HEP Goal status: ACHIEVED     LONG TERM GOALS: Target date: 10/14/2021   Pt will achieve a FOTO score meeting the predictive value in order to demonstrate improved functional ability as it relates to his primary impairments. Baseline: To be administered at first treatment 9/15: 37 Goal status: NOT MET   2.  Pt will achieve BIL global hip strength of 5/5 in order to progress his independent LE strengthening regimen with less limitation. Baseline: See MMT chart 9/15: see chart Goal status: NOT MET   3.  Pt will achieve WNL global lumbar AROM with 0-4/10 pain in order to get dressed with less limitation. Baseline: See AROM chart (>8/10 pain in all affected movements) 09/16/2021: See updated AROM chart Goal status: IN PROGRESS   4.  Pt will achieve Rt shoulder elevation AROM of 150 degrees or higher in order to reach into overhead cabinets with less limitation. Baseline: Flexion: 100 degrees, abduction: 80 degrees 09/16/2021: 120 degrees in flexion and abduction 9/15: 120 abd, 110 abd Goal status: NOT MET   5.  Pt will report ability to sit > 30 minutes with 0-4/10 pain in order to go on car trips with less limitation. Baseline: >8/10 pain with 10 minutes of sitting 915: 7/10 pain Goal status: NOT MET       PLAN: PT FREQUENCY: 1x/week   PT DURATION: 8 weeks   PLANNED INTERVENTIONS: Therapeutic exercises, Therapeutic activity, Neuromuscular re-education, Balance training, Gait training, Patient/Family education, Self Care, Joint  mobilization, Joint manipulation, Stair training, Orthotic/Fit training, DME instructions, Aquatic Therapy, Dry Needling, Electrical stimulation, Spinal manipulation, Spinal mobilization, Cryotherapy, Moist heat, Taping, Vasopneumatic device, Traction, Biofeedback, Ionotophoresis 37m/ml Dexamethasone, Manual therapy, and Re-evaluation.   PLAN FOR NEXT SESSION: Further assess Lt hip, progress RTC/ core/ LE strengthening   KKevan NyReinhartsen PT 10/02/21 9:57 AM

## 2021-10-03 ENCOUNTER — Other Ambulatory Visit: Payer: Self-pay

## 2021-10-06 ENCOUNTER — Other Ambulatory Visit: Payer: Self-pay | Admitting: Sports Medicine

## 2021-10-06 DIAGNOSIS — M542 Cervicalgia: Secondary | ICD-10-CM

## 2021-10-07 ENCOUNTER — Ambulatory Visit: Payer: Self-pay | Admitting: Physical Therapy

## 2021-10-12 ENCOUNTER — Ambulatory Visit: Payer: Self-pay | Admitting: Rehabilitative and Restorative Service Providers"

## 2021-10-12 ENCOUNTER — Ambulatory Visit: Payer: Self-pay | Admitting: Sports Medicine

## 2021-10-13 ENCOUNTER — Ambulatory Visit (INDEPENDENT_AMBULATORY_CARE_PROVIDER_SITE_OTHER): Payer: Self-pay | Admitting: Sports Medicine

## 2021-10-13 ENCOUNTER — Encounter: Payer: Self-pay | Admitting: Sports Medicine

## 2021-10-13 DIAGNOSIS — M5442 Lumbago with sciatica, left side: Secondary | ICD-10-CM

## 2021-10-13 DIAGNOSIS — W108XXD Fall (on) (from) other stairs and steps, subsequent encounter: Secondary | ICD-10-CM

## 2021-10-13 DIAGNOSIS — M25511 Pain in right shoulder: Secondary | ICD-10-CM

## 2021-10-13 DIAGNOSIS — G8929 Other chronic pain: Secondary | ICD-10-CM

## 2021-10-13 DIAGNOSIS — M542 Cervicalgia: Secondary | ICD-10-CM

## 2021-10-13 DIAGNOSIS — M792 Neuralgia and neuritis, unspecified: Secondary | ICD-10-CM

## 2021-10-13 NOTE — Progress Notes (Signed)
Gabriel Ball - 51 y.o. male MRN [id number]  Date of birth: 1970/03/28  Office Visit Note: Visit Date: 10/13/2021 PCP: Gildardo Pounds, NP Referred by: Gildardo Pounds, NP  Subjective: CC: Right neck, shoulder pain, low back and left leg pain.  The use of an in person Spanish interpreter was used throughout the entirety of the visit.  HPI: Gabriel Ball is a pleasant 51 y.o. male who presents today for follow-up of his multiple ailments after a fall.  As reminder, he had a fall at work on July 15, 2021 where he fell about 3 feet from a ladder and tried to graHe had extensive imaging including x-rays of the lumbar spine, right shoulder as well as right ribs that did not show any evidence of acute fracture.  He also underwent CT of the cervical spine on 07/21/2021 which did not show any fracture or malalignment.  CT scan of the lumbar spine on 07/21/2021 which showed small disc herniations at L4-L5 and L5-S1. b himself on the door with her right arm and felt like he stretched the right arm and neck.   He was previously evaluated at [facility], he had an appointment with them and per the patient, they did not want to see him back they told him to go to physical therapy.  He has been doing sessions of physical therapy both with the referral and here at [facility] without much relief.  His pain continues to be all over the body from the neck and right shoulder down into all 5 fingers.  He also has pain over the posterior lateral right rib cage and the left side of the low back.  He states the pain will radiate down the left leg all around the leg circumferentially into the groin and down into the feet.  He is taking Tylenol and amitriptyline without much relief.  Pertinent ROS were reviewed with the patient and found to be negative unless otherwise specified above in HPI.   Assessment & Plan: Visit Diagnoses:  1. Fall (on) (from) other stairs and steps, subsequent encounter    2. Neck pain   3. Radicular pain of right upper extremity   4. Chronic right shoulder pain   5. Chronic left-sided low back pain with left-sided sciatica    Plan: I discussed with [name] that he does have several painful conditions.  He has had CT scan of both the cervical and lumbar spine which were largely unremarkable other than showing small disc herniations at L4-L5 and L5-S1.  He has seen neurosurgery which per his report they did not find any findings that would not allow him to continue working.  He does have radicular symptoms down the left leg and the right upper extremity, although these do not fit a typical dermatomal or myotomal pattern.  There is an issue with some effort putting him through range of motion exercises, unclear if this is secondary to pain or not.  We are trying to get a MRI of the cervical spine to evaluate for any neural impingement, although cost is prohibiting this at this time.  At this point, I would like him to be able to get back to work as he wishes so that he can make some money for him and his family.  I think we need to proceed with a functional capacity evaluation to see what he is able to perform on a daily basis with work duties.  We will see him back  after this to discuss next steps and if any restrictions are needed.  If unable to get the cervical MRI, I may have him see my partner Dr. [name] for possible nerve conduction/EMG for the right upper extremity.  We can also try a one-time subacromial joint injection to see if this does help improve his shoulder pain.  Follow-up: Return for f/u 1-week after FCE evaluation.   Meds & Orders: No orders of the defined types were placed in this encounter.   Orders Placed This Encounter  Procedures   Ambulatory referral to Physical Therapy     Procedures: No procedures performed      Clinical History: No specialty comments available.  He reports that he has quit smoking. He has never used smokeless tobacco.   Recent Labs    09/11/21 0836  HGBA1C 5.4    Objective:    Physical Exam  Gen: Well-appearing, in no acute distress; non-toxic CV: Well-perfused. Warm.  Resp: Breathing unlabored on room air; no wheezing. Psych: Fluid speech in conversation; appropriate affect; normal thought process Neuro: Sensation intact throughout. No gross coordination deficits.   Ortho Exam - Cervical spine: There is some generalized TTP throughout the spine more so on the right side both of the paraspinal muscles and the spinous process.  There is some restriction in endrange extension and right sidebending.  Positive Spurling's test on the right with reproduction of radicular pain, although all down the right arm without any specific dermatomal pattern.  2/4 brachioradialis, biceps and triceps DTRs bilaterally.  - Right shoulder: There is hypertonicity noted of the right trapezius musculature.  He does have some TTP that is generalized throughout the anterolateral shoulder as well as the posterior lateral cluster of the rib cage from about ribs 7-10.  No bony step-off palpated.  There is some restriction of motion actively to forward flexion and abduction, I am able to take him through full range of motion passively albeit with some pain.  Positive drop arm test.  There is a lack of effort that is apparent when taking him through rotator cuff testing for the right upper extremity.  Radial pulse palpable.  Lumbar spine: There is tenderness to palpation of the left paraspinal muscles of the lumbar spine and around the SI joint region.  He has fluid internal/external rotation about the hip.  Full strength of the knee with flexion extension and ankle dorsiflexion/plantarflexion.  2/4 patellar and Achilles reflexes bilaterally.   Imaging: No results found.  Past Medical/Family/Surgical/Social History: Medications & Allergies reviewed per EMR, new medications updated. Patient Active Problem List   Diagnosis Date Noted    Acute bilateral low back pain without sciatica 06/05/2020   Acute pain of right shoulder 06/05/2020   Asthma due to environmental allergies 12/20/2016   HSV (herpes simplex virus) infection 02/24/2015   Anxiety 04/02/2014   Past Medical History:  Diagnosis Date   Asthma    Herpes    Shingles    Family History  Problem Relation Age of Onset   Cancer Father    Past Surgical History:  Procedure Laterality Date   MOUTH SURGERY     Social History   Occupational History   Not on file  Tobacco Use   Smoking status: Former   Smokeless tobacco: Never  Vaping Use   Vaping Use: Never used  Substance and Sexual Activity   Alcohol use: No   Drug use: No   Sexual activity: Yes   I spent 42 minutes in the care  of the patient today including face-to-face time, preparation to see the patient, as well as reviewing outside imaging, counseling and educating the patient on treatment modalities, what a functional capacity evaluation entails, communicating with the Spanish interpreter for the above diagnoses.

## 2021-10-13 NOTE — Patient Instructions (Signed)
-   We will get you scheduled for a Functional Capacity Evaluation --> this will better allow [place] to see what sort of job activities you are able to perform, in which point you are not.  Someone will be reaching out to give you a call to help set this up.  -We will follow-up about 1 week after your FCE evaluation

## 2021-10-13 NOTE — Progress Notes (Signed)
Pain from neck down to right arm, low back pain down the left leg.   Has worked with PT for the right arm, has discontinued that due to pending MRI. PT will start back on October 3.   Has not gotten MRI yet. Tylenol for pain; not much help  Not working at the moment

## 2021-10-15 ENCOUNTER — Other Ambulatory Visit: Payer: Self-pay

## 2021-10-15 ENCOUNTER — Emergency Department (HOSPITAL_COMMUNITY)
Admission: EM | Admit: 2021-10-15 | Discharge: 2021-10-15 | Disposition: A | Discharge status: Home / Self Care | Payer: Self-pay | Attending: Emergency Medicine | Admitting: Emergency Medicine

## 2021-10-15 ENCOUNTER — Emergency Department (HOSPITAL_COMMUNITY): Payer: Self-pay

## 2021-10-15 DIAGNOSIS — M5413 Radiculopathy, cervicothoracic region: Secondary | ICD-10-CM | POA: Insufficient documentation

## 2021-10-15 DIAGNOSIS — Y990 Civilian activity done for income or pay: Secondary | ICD-10-CM | POA: Insufficient documentation

## 2021-10-15 DIAGNOSIS — W11XXXA Fall on and from ladder, initial encounter: Secondary | ICD-10-CM | POA: Insufficient documentation

## 2021-10-15 MED ORDER — PREDNISONE 20 MG PO TABS
ORAL_TABLET | ORAL | 0 refills | Status: DC
  Filled 2021-10-15: qty 11, 5d supply, fill #0

## 2021-10-15 NOTE — ED Triage Notes (Signed)
Pt states that he fell at work in June and has had continued pain in R rib pain, R arm pain, and L leg pain. Pt states he has been seen multiple times and has been diagnosed with arthritis and a issues with a disc in his back. Pt states he is here for further workup and pain control. He has had no further injuries or trauma to area.

## 2021-10-15 NOTE — Discharge Instructions (Addendum)
You have been evaluated for your recurrent right arm and chest pain.  This is likely due to a pinched nerve.  Continue to take medication previously prescribed and also take prednisone prescribed for symptom control.  Call and follow-up closely with your orthopedic specialist for further managements of your condition.  Usted ha sido evaluado por su dolor recurrente en el brazo derecho y [place].  Esto es probablemente debido a un nervio pellizcado.  Contine tomando los medicamentos recetados previamente y tambin tome la prednisona recetada para el control de los sntomas.  Llame y haga un seguimiento cercano con su especialista ortopdico para obtener ms controles de su afeccin.

## 2021-10-15 NOTE — ED Provider Notes (Signed)
[facility] DEPT Provider Note   CSN: [id number] Arrival date & time: 10/15/21  0830     History  Chief Complaint  Patient presents with   Arm Pain    Gabriel Ball is a 51 y.o. male.  The history is provided by the patient and medical records. The history is limited by a language barrier. A language interpreter was used.  Arm Pain    51 year old Hispanic male presenting to ED for evaluation of arm pain.  History obtained using language interpreter.  Patient had a fall at work on June 28.  States he was standing on a 3 feet ladder, fell, reach out with his right arm to grab onto a door to prevent falling and in the process he has had quite a bit of pain throughout his body spine legs and most notable his right arm.  States pain started in his right shoulder and radiates down his right arm with tingling sensation to several of his fingers.  He has been seen and evaluated by multiple providers including orthopedics and has had numerous imaging including x-rays and CT scan.  He was told that he has some disc hernia in his lumbar spine.  He received multiple medication as well as physical therapy for his treatment but reports minimal improvement of his symptoms.  He is here requesting for additional managements of his symptoms.  No report of any fever or arm weakness.  Endorse increasing right arm pain with rotating his neck to the right side.  Home Medications Prior to Admission medications   Medication Sig Start Date End Date Taking? Authorizing Provider  acetaminophen-codeine (TYLENOL #3) 300-30 MG tablet Take 1 tablet by mouth every 12 (twelve) hours as needed for severe pain. 08/03/21   Gildardo Pounds, NP  amitriptyline (ELAVIL) 25 MG tablet Take 1 tablet (25 mg total) by mouth at bedtime. Start with 1/2 tablet for one week. If tolerating well, may increase to 1 tablet at bedtime 09/14/21 11/13/21  Elba Barman, DO  diazepam (VALIUM) 5 MG tablet Take 5  mg by mouth every 6 (six) hours as needed for anxiety.    [provider]  hydrOXYzine (ATARAX) 25 MG tablet Take 1 tablet (25 mg total) by mouth 2 (two) times daily as needed for anxiety. 10/02/21   Scot Jun, FNP  polyethylene glycol powder (MIRALAX) 17 GM/SCOOP powder Dissolve 1 capful (17 grams) in 4-8 ounces of water/juice and take by mouth 2 (two) times daily. 09/11/21   Charlott Rakes, MD  triamcinolone cream (KENALOG) 0.1 % Apply to affected area(s) 2 (two) times daily until area is better. 08/21/21   Barrett Henle, MD  SUMAtriptan (IMITREX) 50 MG tablet Take one for headache.  May repeat in 2 hours.  No more than 2 a day Patient not taking: Reported on 02/09/2018 05/27/17 07/26/18  Raylene Everts, MD      Allergies    Cyclobenzaprine, Ibuprofen, Methocarbamol, Amoxicillin, and Penicillins    Review of Systems   Review of Systems  All other systems reviewed and are negative.   Physical Exam Updated Vital Signs BP 107/73 (BP Location: Left Arm)   Pulse 69   Temp 98.5 F (36.9 C) (Oral)   Resp 16   SpO2 100%  Physical Exam Vitals and nursing note reviewed.  Constitutional:      General: He is not in acute distress.    Appearance: He is well-developed.  HENT:     Head: Normocephalic and  atraumatic.  Eyes:     Conjunctiva/sclera: Conjunctivae normal.  Neck:     Comments: Neck with full range of motion without any significant midline spine tenderness. Musculoskeletal:     Cervical back: Normal range of motion and neck supple.     Comments: 4/5 strength to RUE, 5/5 strength LUE.  Grip strength intact with subjective decreased sensation to 3rd-5th.   Skin:    Findings: No rash.  Neurological:     Mental Status: He is alert.    ED Results / Procedures / Treatments   Labs (all labs ordered are listed, but only abnormal results are displayed) Labs Reviewed - No data to display  EKG None  Radiology MR Cervical Spine Wo Contrast  Result Date:  10/15/2021 CLINICAL DATA:  Cervical radiculopathy. EXAM: MRI CERVICAL SPINE WITHOUT CONTRAST TECHNIQUE: Multiplanar, multisequence MR imaging of the cervical spine was performed. No intravenous contrast was administered. COMPARISON:  07/26/2019 FINDINGS: Alignment: Physiologic. Vertebrae: No acute fracture, evidence of discitis, or aggressive bone lesion. Cord: Normal signal and morphology. Posterior Fossa, vertebral arteries, paraspinal tissues: Posterior fossa demonstrates no focal abnormality. Vertebral artery flow voids are maintained. Paraspinal soft tissues are unremarkable. Disc levels: Discs: Disc spaces are preserved. C2-3: No significant disc bulge. No neural foraminal stenosis. No central canal stenosis. C3-4: Minimal broad-based disc bulge. No foraminal or central canal stenosis. C4-5: No significant disc bulge. No neural foraminal stenosis. No central canal stenosis. C5-6: Broad central shallow disc protrusion. No foraminal or central canal stenosis. C6-7: No significant disc bulge. No neural foraminal stenosis. No central canal stenosis. C7-T1: No significant disc bulge. No neural foraminal stenosis. No central canal stenosis. IMPRESSION: 1. Cervical spine spondylosis as described above. No evidence of nerve root impingement. 2. No acute osseous injury of the cervical spine. Electronically Signed   By: Elige Ko M.D.   On: 10/15/2021 13:14    Procedures Procedures    Medications Ordered in ED Medications - No data to display  ED Course/ Medical Decision Making/ A&P                           Medical Decision Making Amount and/or Complexity of Data Reviewed Radiology: ordered.   BP 107/73 (BP Location: Left Arm)   Pulse 69   Temp 98.5 F (36.9 C) (Oral)   Resp 16   SpO2 100%   1:21 PM This is a 51 year old Hispanic male presenting with complaints of right arm pain and numbness.  Patient endorsed he fell from a 3 foot ladder while at work several months prior and since then he  has had persistent pain about his right arm and shoulder as well as pain to his legs and back and chest.  States he has been seen evaluate multiple times for his complaint and has had extensive work-up including multiple x-rays and CT scans.  He is currently being managed by orthopedist.  He reports despite taking the medication prescribed he still has quite a bit of pain most notable is pain to his right arm spanning from his right scapula radiates to his right hand with tingling sensation to several fingers.  I have reviewed patient's prior EMR including prior visits along with labs and imaging.  Through previous orthopedist note, they have thought patient may benefit from a cervical spine MRI but patient has not had this imaging yet.  Given his progressive worsening radicular pain involving his right and pain when he turns his neck, I  have order a cervical spine MRI for further assessment.  MRI of the cervical spine was obtained, independently viewed and interpreted by me and I agrees with radiologist's interpretation.  MRI demonstrates cervical spine spondylosis without evidence of nerve root impingement and no acute osseous injury of the cervical spine.  I discussed this finding with patient using language interpreter.  At this time I would like to prescribe patient a course of steroid as the symptom is suggestive of radiculopathy.  Encourage patient to follow-up with his orthopedist for outpatient care.  He is stable to be discharged.          Final Clinical Impression(s) / ED Diagnoses Final diagnoses:  Radiculopathy of cervicothoracic region    Rx / DC Orders ED Discharge Orders          Ordered    predniSONE (DELTASONE) 20 MG tablet        10/15/21 1403              Fayrene Helper, PA-C 10/15/21 1409    Glyn Ade, MD 10/15/21 1438

## 2021-10-19 ENCOUNTER — Other Ambulatory Visit: Payer: Self-pay

## 2021-10-19 ENCOUNTER — Ambulatory Visit: Payer: Self-pay | Admitting: Sports Medicine

## 2021-10-19 ENCOUNTER — Encounter (HOSPITAL_COMMUNITY): Payer: Self-pay

## 2021-10-19 ENCOUNTER — Emergency Department (HOSPITAL_COMMUNITY)
Admission: EM | Admit: 2021-10-19 | Discharge: 2021-10-19 | Disposition: A | Discharge status: Home / Self Care | Payer: Self-pay | Attending: Emergency Medicine | Admitting: Emergency Medicine

## 2021-10-19 DIAGNOSIS — Z7951 Long term (current) use of inhaled steroids: Secondary | ICD-10-CM | POA: Insufficient documentation

## 2021-10-19 DIAGNOSIS — M79672 Pain in left foot: Secondary | ICD-10-CM | POA: Insufficient documentation

## 2021-10-19 DIAGNOSIS — J45909 Unspecified asthma, uncomplicated: Secondary | ICD-10-CM | POA: Insufficient documentation

## 2021-10-19 DIAGNOSIS — M5416 Radiculopathy, lumbar region: Secondary | ICD-10-CM

## 2021-10-19 MED ORDER — METHOCARBAMOL 500 MG PO TABS
500.0000 mg | ORAL_TABLET | Freq: Two times a day (BID) | ORAL | 0 refills | Status: DC
  Filled 2021-10-19 – 2021-11-02 (×2): qty 20, 10d supply, fill #0
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
## 2021-10-19 NOTE — ED Triage Notes (Signed)
Pt arrived via POV, c/o left leg pain, into back. States fell in June at work and has had cont pain. States has been seen for same, pain continued.

## 2021-10-19 NOTE — Discharge Instructions (Addendum)
Follow up with your orthopedic and neurology teams for further care.  Take Robaxin as needed as prescribed, do not drive or operate machinery while taking this medication.

## 2021-10-19 NOTE — ED Provider Notes (Signed)
[facility] DEPT Provider Note   CSN: [id number] Arrival date & time: 10/19/21  0854     History  Chief Complaint  Patient presents with   Leg Pain    Gabriel Ball is a 51 y.o. male.  51 year old male presents with complaint of pain in the left foot related to a work injury on 07/07/21, ongoing pain since this time. Has been managed by orthopedics and neurosurgeon who have seen patient for this foot pain. Also going to therapy. Seen here last week with arm pain, had MRI c-spine. Pain starts in the left buttocks area and radiates to the foot. No new or change in symptoms since onset, denies abdominal pain, groin paresthesia, abdominal pain.  History of asthma, shingles.   A language interpreter was used ([place]).  Leg Pain      Home Medications Prior to Admission medications   Medication Sig Start Date End Date Taking? Authorizing Provider  methocarbamol (ROBAXIN) 500 MG tablet Take 1 tablet (500 mg total) by mouth 2 (two) times daily. 10/19/21  Yes Tacy Learn, PA-C  acetaminophen-codeine (TYLENOL #3) 300-30 MG tablet Take 1 tablet by mouth every 12 (twelve) hours as needed for severe pain. 08/03/21   Gildardo Pounds, NP  amitriptyline (ELAVIL) 25 MG tablet Take 1 tablet (25 mg total) by mouth at bedtime. Start with 1/2 tablet for one week. If tolerating well, may increase to 1 tablet at bedtime 09/14/21 11/13/21  Elba Barman, DO  diazepam (VALIUM) 5 MG tablet Take 5 mg by mouth every 6 (six) hours as needed for anxiety.    [provider]  hydrOXYzine (ATARAX) 25 MG tablet Take 1 tablet (25 mg total) by mouth 2 (two) times daily as needed for anxiety. 10/02/21   Scot Jun, FNP  polyethylene glycol powder (MIRALAX) 17 GM/SCOOP powder Dissolve 1 capful (17 grams) in 4-8 ounces of water/juice and take by mouth 2 (two) times daily. 09/11/21   Charlott Rakes, MD  predniSONE (DELTASONE) 20 MG tablet Take 3 tabs by mouth on day  one, then 2 tabs daily for 4 days. 10/15/21   Domenic Moras, PA-C  triamcinolone cream (KENALOG) 0.1 % Apply to affected area(s) 2 (two) times daily until area is better. 08/21/21   Barrett Henle, MD  SUMAtriptan (IMITREX) 50 MG tablet Take one for headache.  May repeat in 2 hours.  No more than 2 a day Patient not taking: Reported on 02/09/2018 05/27/17 07/26/18  Raylene Everts, MD      Allergies    Cyclobenzaprine, Ibuprofen, Methocarbamol, Amoxicillin, and Penicillins    Review of Systems   Review of Systems Negative except as per HPI Physical Exam Updated Vital Signs BP 110/72 (BP Location: Left Arm)   Pulse 80   Temp 98 F (36.7 C) (Oral)   Resp 20   SpO2 100%  Physical Exam Cardiovascular:     Pulses: Normal pulses.  Abdominal:     Palpations: Abdomen is soft.     Tenderness: There is no abdominal tenderness.  Musculoskeletal:        General: Tenderness present. No swelling or deformity.     Thoracic back: No tenderness or bony tenderness.     Lumbar back: Tenderness present. No bony tenderness.       Back:  Skin:    General: Skin is warm and dry.     Findings: No erythema or rash.  Neurological:     Sensory: No sensory deficit.  Motor: No weakness.     Gait: Gait normal.     Deep Tendon Reflexes: Reflexes normal.     Reflex Scores:      Patellar reflexes are 2+ on the right side and 2+ on the left side.    ED Results / Procedures / Treatments   Labs (all labs ordered are listed, but only abnormal results are displayed) Labs Reviewed - No data to display  EKG None  Radiology No results found.  Procedures Procedures    Medications Ordered in ED Medications - No data to display  ED Course/ Medical Decision Making/ A&P                           Medical Decision Making Risk Prescription drug management.   This patient presents to the ED for concern of left leg pain, this involves an extensive number of treatment options, and is a complaint  that carries with it a high risk of complications and morbidity.  The differential diagnosis includes but not limited to lumbar radiculopathy, sciatica, epidural abscess, disc   Co morbidities that complicate the patient evaluation  Shingles, asthma   Additional history obtained:  External records from outside source obtained and reviewed including recent ER visit dated 10/15/21, seen here with similar concerns, had MRI c-spine at that time which I have reviewed, shoed spondylosis without nerve root impingement. No acute injury.  CT lumbar spine, no acute abnormality, small disc herniations at L4-5 and L5-S1.   Problem List / ED Course / Critical interventions / Medication management  51 year old male with complaint of ongoing pain in his left lower back which radiates down his left leg to his foot.  Onset after work-related injury in June, managed by neurology on orthopedics as well as doing PT without improvement. Denies any changes in his baseline or chronic pain at this point.  Is taking prednisone following his recent ER visit for his ongoing cervical radiculopathy without improvement in his lumbar radiculopathy or cervical radiculopathy.  Not currently on a muscle relaxant or NSAID otherwise.  Pulses present, sensation intact, reflexes symmetric.  Pain is reproduced with palpation through his left lower back and to left SI.  No red flag symptoms.  Imaging reviewed including CT lumbar spine and MRI of C-spine.  Provided with prescription for Robaxin and encouraged to follow-up with his care team. I have reviewed the patients home medicines and have made adjustments as needed   Social Determinants of Health:  Spanish speaking   Test / Admission - Considered:  After review of recent imaging on file, do not feel it is necessary to repeat imaging including a lumbar spine at this time.         Final Clinical Impression(s) / ED Diagnoses Final diagnoses:  Lumbar radiculopathy     Rx / DC Orders ED Discharge Orders          Ordered    methocarbamol (ROBAXIN) 500 MG tablet  2 times daily        10/19/21 1040              Roque Lias 10/19/21 1052    Godfrey Pick, MD 10/19/21 1737

## 2021-10-20 ENCOUNTER — Ambulatory Visit: Payer: Self-pay | Attending: Neurological Surgery | Admitting: Physical Therapy

## 2021-10-20 ENCOUNTER — Other Ambulatory Visit: Payer: Self-pay

## 2021-10-20 ENCOUNTER — Ambulatory Visit: Payer: Self-pay | Admitting: Physical Therapy

## 2021-10-20 ENCOUNTER — Encounter: Payer: Self-pay | Admitting: Physical Therapy

## 2021-10-20 DIAGNOSIS — M5459 Other low back pain: Secondary | ICD-10-CM

## 2021-10-20 DIAGNOSIS — M542 Cervicalgia: Secondary | ICD-10-CM

## 2021-10-20 DIAGNOSIS — G8929 Other chronic pain: Secondary | ICD-10-CM

## 2021-10-20 DIAGNOSIS — R293 Abnormal posture: Secondary | ICD-10-CM

## 2021-10-20 DIAGNOSIS — M6281 Muscle weakness (generalized): Secondary | ICD-10-CM

## 2021-10-20 DIAGNOSIS — M25511 Pain in right shoulder: Secondary | ICD-10-CM | POA: Insufficient documentation

## 2021-10-21 ENCOUNTER — Telehealth: Payer: Self-pay | Admitting: Sports Medicine

## 2021-10-21 NOTE — Telephone Encounter (Signed)
Called pt to figure out why he hasn't done his FCE. Please send pt to me.   CB [phone]

## 2021-10-23 ENCOUNTER — Ambulatory Visit: Payer: Self-pay | Admitting: Sports Medicine

## 2021-10-29 ENCOUNTER — Other Ambulatory Visit: Payer: Self-pay

## 2021-11-02 ENCOUNTER — Other Ambulatory Visit: Payer: Self-pay

## 2021-11-04 ENCOUNTER — Encounter: Payer: Self-pay | Admitting: Sports Medicine

## 2021-11-04 ENCOUNTER — Telehealth: Payer: Self-pay | Admitting: Sports Medicine

## 2021-11-04 ENCOUNTER — Ambulatory Visit (INDEPENDENT_AMBULATORY_CARE_PROVIDER_SITE_OTHER): Payer: Self-pay | Admitting: Sports Medicine

## 2021-11-04 DIAGNOSIS — M792 Neuralgia and neuritis, unspecified: Secondary | ICD-10-CM

## 2021-11-04 DIAGNOSIS — M25511 Pain in right shoulder: Secondary | ICD-10-CM

## 2021-11-04 DIAGNOSIS — M5442 Lumbago with sciatica, left side: Secondary | ICD-10-CM

## 2021-11-04 DIAGNOSIS — G8929 Other chronic pain: Secondary | ICD-10-CM

## 2021-11-04 DIAGNOSIS — W108XXD Fall (on) (from) other stairs and steps, subsequent encounter: Secondary | ICD-10-CM

## 2021-11-04 MED ORDER — METHYLPREDNISOLONE ACETATE 40 MG/ML IJ SUSP
80.0000 mg | INTRAMUSCULAR | Status: AC | PRN
  Administered 2021-11-04: 80 mg via INTRA_ARTICULAR

## 2021-11-04 MED ORDER — LIDOCAINE HCL 1 % IJ SOLN
2.0000 mL | INTRAMUSCULAR | Status: AC | PRN
  Administered 2021-11-04: 2 mL

## 2021-11-04 MED ORDER — BUPIVACAINE HCL 0.25 % IJ SOLN
2.0000 mL | INTRAMUSCULAR | Status: AC | PRN
  Administered 2021-11-04: 2 mL via INTRA_ARTICULAR

## 2021-11-04 NOTE — Telephone Encounter (Signed)
Patient came by. He wanted a copy of his FCE. I told patient to go to the place he had the FCE done and request the records.

## 2021-11-04 NOTE — Progress Notes (Signed)
Gabriel Ball - 51 y.o. male MRN [id number]  Date of birth: August 23, 1970  Office Visit Note: Visit Date: 11/04/2021 PCP: Claiborne Rigg, NP Referred by: Claiborne Rigg, NP  Subjective: No chief complaint on file.  HPI: Gabriel Ball is a pleasant 51 y.o. male who presents today for follow-up of right shoulder, right arm, and low back pain.  The use of an inpatient Spanish interpreter was used throughout the entirety of the visit.  As reminder, he had a fall at work on July 15, 2021 where he fell about 3 feet from a ladder and tried to grab himself on the door with her right arm and felt like he stretched the right arm and neck.   He has had extensive imaging including x-rays of the lumbar spine, right shoulder as well as right ribs that did not show any evidence of acute fracture.  He also underwent CT of the cervical spine on 07/21/2021 which did not show any fracture or malalignment.  CT scan of the lumbar spine on 07/21/2021 which showed small disc herniations at L4-L5 and L5-S1.  He underwent MRI of the cervical spine on 10/15/2021 in the emergency room which showed some mild cervical spine spondylosis without any evidence of nerve root impingement or acute osseous injury.  There is a small central shallow disc protrusion at C5-C6.  He did undergo FCE at [facility] on 10/26/21.  Given that evaluation, they did evaluate that Gabriel Ball is able to carry 15 pounds, is limited in bending, squatting, kneeling, climbing, crawling, standing and walking.  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  Gabriel Ball states that he feels like his pain has not gotten any better.  Still continues with pain from the right side of the neck into the shoulder.  He does he get pain through the arm but he reports numbness and tingling that extends from the shoulder all the way into all 5  fingers the right hand.  Denies any of these radicular symptoms on the left hand.  Still gets some chronic back pain and some occasional pain that goes down the left leg.  He has done multiple sessions of formalized physical therapy without much relief.  He has tried Tylenol, Elavil, gabapentin and 2 rounds of oral prednisone without relief.  Pertinent ROS were reviewed with the patient and found to be negative unless otherwise specified above in HPI.   Assessment & Plan: Visit Diagnoses:  1. Radicular pain of right upper extremity   2. Fall (on) (from) other stairs and steps, subsequent encounter   3. Chronic right shoulder pain   4. Chronic left-sided low back pain with left-sided sciatica    Plan: I discussed with Gabriel Ball that I think both of [place] are slightly frustrated as we do not have a great cause of his pain.  In terms of the low back and occasional left leg radicular pain, I think he will need to continue formalized physical therapy or home therapy for this to get better.  He is most bothersome ailment is the consistent numbness and radicular symptoms that go from the right neck and shoulder into the arm and all 5 fingers. Based on the mechanism of his fall which was an outstretched right arm and neck in which he had to hang from the platform for a prolonged period of time, I think there could have been a brachial plexus injury such as  neurpraxia or axonotmesis, and according to the patient he has not gotten any better since his initial injury in June.  This is why I would like to send him to Dr. [name] and team so they may perform EMG and nerve conduction study of the upper extremities.  Given that he had ongoing right shoulder pain as well, through shared decision making, did elect to proceed with subacromial joint injection into the right shoulder.  I am hopeful this will help improve his shoulder pain, although I do not think it will help with the radicular symptoms, but we will follow-up after  the EMG/NCS to discuss next steps.  He is currently not working at this time.  Follow-up: F/u after NCS/EMS   Meds & Orders: No orders of the defined types were placed in this encounter.   Orders Placed This Encounter  Procedures   Ambulatory referral to Physical Medicine Rehab     Procedures: Large Joint Inj: R subacromial bursa on 11/04/2021 2:05 PM Indications: pain and diagnostic evaluation Details: 22 G 1.5 in needle, posterior approach Medications: 2 mL lidocaine 1 %; 2 mL bupivacaine 0.25 %; 80 mg methylPREDNISolone acetate 40 MG/ML Procedure, treatment alternatives, risks and benefits explained, specific risks discussed. Consent was given by the patient. Immediately prior to procedure a time out was called to verify the correct patient, procedure, equipment, support staff and site/side marked as required. Patient was prepped and draped in the usual sterile fashion.          Clinical History: No specialty comments available.  He reports that he has quit smoking. He has never used smokeless tobacco.  Recent Labs    09/11/21 0836  HGBA1C 5.4    Objective:    Physical Exam  Gen: Well-appearing, in no acute distress; non-toxic CV: Regular Rate. Well-perfused. Warm.  Resp: Breathing unlabored on room air; no wheezing. Psych: Fluid speech in conversation; appropriate affect; normal thought process Neuro: Sensation intact throughout. No gross coordination deficits.   Ortho Exam -Cervical spine: There is some generalized TTP throughout the spine but more so in the right paraspinal muscles.  He does have pain with endrange extension.  He has reported reproduction of radicular symptoms with Spurling's test on the right.  DTRs not tested today, but were 2/4 of the brachioradialis, biceps and triceps bilaterally at last visit.  -Right shoulder: There is significant guarding of the shoulder in all directions, although I am able to take him through all full range of motion  passively.  There is lack of effort through rotator cuff testing, although no appreciable weakness noted. + Drop arm test.  Imaging:  MR Cervical Spine Wo Contrast CLINICAL DATA:  Cervical radiculopathy.  EXAM: MRI CERVICAL SPINE WITHOUT CONTRAST  TECHNIQUE: Multiplanar, multisequence MR imaging of the cervical spine was performed. No intravenous contrast was administered.  COMPARISON:  07/26/2019  FINDINGS: Alignment: Physiologic.  Vertebrae: No acute fracture, evidence of discitis, or aggressive bone lesion.  Cord: Normal signal and morphology.  Posterior Fossa, vertebral arteries, paraspinal tissues: Posterior fossa demonstrates no focal abnormality. Vertebral artery flow voids are maintained. Paraspinal soft tissues are unremarkable.  Disc levels:  Discs: Disc spaces are preserved.  C2-3: No significant disc bulge. No neural foraminal stenosis. No central canal stenosis.  C3-4: Minimal broad-based disc bulge. No foraminal or central canal stenosis.  C4-5: No significant disc bulge. No neural foraminal stenosis. No central canal stenosis.  C5-6: Broad central shallow disc protrusion. No foraminal or central canal stenosis.  C6-7: No  significant disc bulge. No neural foraminal stenosis. No central canal stenosis.  C7-T1: No significant disc bulge. No neural foraminal stenosis. No central canal stenosis.  IMPRESSION: 1. Cervical spine spondylosis as described above. No evidence of nerve root impingement. 2. No acute osseous injury of the cervical spine.  Electronically Signed   By: Kathreen Devoid M.D.   On: 10/15/2021 13:14    Past Medical/Family/Surgical/Social History: Medications & Allergies reviewed per EMR, new medications updated. Patient Active Problem List   Diagnosis Date Noted   Acute bilateral low back pain without sciatica 06/05/2020   Acute pain of right shoulder 06/05/2020   Asthma due to environmental allergies 12/20/2016   HSV (herpes  simplex virus) infection 02/24/2015   Anxiety 04/02/2014   Past Medical History:  Diagnosis Date   Asthma    Herpes    Shingles    Family History  Problem Relation Age of Onset   Cancer Father    Past Surgical History:  Procedure Laterality Date   MOUTH SURGERY     Social History   Occupational History   Not on file  Tobacco Use   Smoking status: Former   Smokeless tobacco: Never  Vaping Use   Vaping Use: Never used  Substance and Sexual Activity   Alcohol use: No   Drug use: No   Sexual activity: Yes

## 2021-11-08 ENCOUNTER — Encounter (HOSPITAL_COMMUNITY): Payer: Self-pay | Admitting: *Deleted

## 2021-11-08 ENCOUNTER — Ambulatory Visit (HOSPITAL_COMMUNITY)
Admission: EM | Admit: 2021-11-08 | Discharge: 2021-11-08 | Disposition: A | Discharge status: Home / Self Care | Payer: Self-pay | Attending: Physician Assistant | Admitting: Physician Assistant

## 2021-11-08 ENCOUNTER — Other Ambulatory Visit: Payer: Self-pay

## 2021-11-08 DIAGNOSIS — J302 Other seasonal allergic rhinitis: Secondary | ICD-10-CM

## 2021-11-08 MED ORDER — OLOPATADINE HCL 0.1 % OP SOLN: 1.0000 [drp] | Freq: Two times a day (BID) | OPHTHALMIC | 1 refills | Status: DC

## 2021-11-08 MED ORDER — LEVOCETIRIZINE DIHYDROCHLORIDE 5 MG PO TABS: 5.0000 mg | ORAL_TABLET | Freq: Every evening | ORAL | 2 refills | Status: DC

## 2021-11-08 NOTE — Discharge Instructions (Signed)
I believe that you have seasonal allergies.  Please start Xyzal at night.  Use Pataday twice a day for itchy/watery eyes.  Use a humidifier in your room at night.  Follow-up with your primary care if symptoms or not improving.  If anything changes please return for reevaluation.

## 2021-11-08 NOTE — ED Triage Notes (Signed)
PT reports for the past 2 days he has had a sore throat and itching in both ears.

## 2021-11-08 NOTE — ED Provider Notes (Signed)
[facility]    CSN: [id number] Arrival date & time: 11/08/21  1002      History   Chief Complaint Chief Complaint  Patient presents with   Sore Throat    [name] is a 51 y.o. male.   Patient presents today with a 2-day history of sore throat, mild headache, itching in his ears, itchy eyes.  He is Spanish-speaking and video interpreter was utilized during visit.  Denies any significant cough, congestion, fever, nausea, vomiting, chest pain, shortness of breath.  He has not tried any over-the-counter medication for symptom management.  Denies any known sick contacts.  Denies any recent antibiotics.  Was given intra-articular steroid injection by orthopedic provider several days ago but denies additional steroid use.  Denies history of asthma but does have allergies.  He has not been taking any medication for this condition.    Past Medical History:  Diagnosis Date   Asthma    Herpes    Shingles     Patient Active Problem List   Diagnosis Date Noted   Acute bilateral low back pain without sciatica 06/05/2020   Acute pain of right shoulder 06/05/2020   Asthma due to environmental allergies 12/20/2016   HSV (herpes simplex virus) infection 02/24/2015   Anxiety 04/02/2014    Past Surgical History:  Procedure Laterality Date   MOUTH SURGERY         Home Medications    Prior to Admission medications   Medication Sig Start Date End Date Taking? Authorizing Provider  levocetirizine (XYZAL ALLERGY 24HR) 5 MG tablet Take 1 tablet (5 mg total) by mouth every evening. 11/08/21  Yes Woodley Petzold K, PA-C  olopatadine (PATADAY) 0.1 % ophthalmic solution Place 1 drop into both eyes 2 (two) times daily. 11/08/21  Yes Paula Zietz K, PA-C  acetaminophen-codeine (TYLENOL #3) 300-30 MG tablet Take 1 tablet by mouth every 12 (twelve) hours as needed for severe pain. 08/03/21   Gildardo Pounds, NP  amitriptyline (ELAVIL) 25 MG tablet Take 1 tablet (25 mg  total) by mouth at bedtime. Start with 1/2 tablet for one week. If tolerating well, may increase to 1 tablet at bedtime 09/14/21 12/02/21  Elba Barman, DO  diazepam (VALIUM) 5 MG tablet Take 5 mg by mouth every 6 (six) hours as needed for anxiety.    [provider]  hydrOXYzine (ATARAX) 25 MG tablet Take 1 tablet (25 mg total) by mouth 2 (two) times daily as needed for anxiety. 10/02/21   Scot Jun, FNP  methocarbamol (ROBAXIN) 500 MG tablet Take 1 tablet (500 mg total) by mouth 2 (two) times daily. 10/19/21   Tacy Learn, PA-C  polyethylene glycol powder (MIRALAX) 17 GM/SCOOP powder Dissolve 1 capful (17 grams) in 4-8 ounces of water/juice and take by mouth 2 (two) times daily. 09/11/21   Charlott Rakes, MD  SUMAtriptan (IMITREX) 50 MG tablet Take one for headache.  May repeat in 2 hours.  No more than 2 a day Patient not taking: Reported on 02/09/2018 05/27/17 07/26/18  Raylene Everts, MD    Family History Family History  Problem Relation Age of Onset   Cancer Father     Social History Social History   Tobacco Use   Smoking status: Former   Smokeless tobacco: Never  Scientific laboratory technician Use: Never used  Substance Use Topics   Alcohol use: No   Drug use: No     Allergies   Cyclobenzaprine, Ibuprofen,  Methocarbamol, Amoxicillin, and Penicillins   Review of Systems Review of Systems  Constitutional:  Positive for activity change. Negative for appetite change, fatigue and fever.  HENT:  Positive for ear pain (Itching) and sore throat. Negative for congestion, sinus pressure and sneezing.   Eyes:  Positive for itching.  Respiratory:  Negative for cough and shortness of breath.   Cardiovascular:  Negative for chest pain.  Gastrointestinal:  Negative for abdominal pain, diarrhea, nausea and vomiting.  Neurological:  Positive for headaches. Negative for dizziness and light-headedness.     Physical Exam Triage Vital Signs ED Triage Vitals  Enc Vitals Group      BP 11/08/21 1027 121/79     Pulse Rate 11/08/21 1027 64     Resp 11/08/21 1027 18     Temp 11/08/21 1027 98.5 F (36.9 C)     Temp src --      SpO2 11/08/21 1027 99 %     Weight --      Height --      Head Circumference --      Peak Flow --      Pain Score 11/08/21 1023 5     Pain Loc --      Pain Edu? --      Excl. in GC? --    No data found.  Updated Vital Signs BP 121/79   Pulse 64   Temp 98.5 F (36.9 C)   Resp 18   SpO2 99%   Visual Acuity Right Eye Distance:   Left Eye Distance:   Bilateral Distance:    Right Eye Near:   Left Eye Near:    Bilateral Near:     Physical Exam Vitals reviewed.  Constitutional:      General: He is awake.     Appearance: Normal appearance. He is well-developed. He is not ill-appearing.     Comments: Very pleasant male appears stated age in no acute distress sitting comfortably in exam room  HENT:     Head: Normocephalic and atraumatic.     Right Ear: Tympanic membrane, ear canal and external ear normal. Tympanic membrane is not erythematous or bulging.     Left Ear: Tympanic membrane, ear canal and external ear normal. Tympanic membrane is not erythematous or bulging.     Nose: Nose normal.     Mouth/Throat:     Pharynx: Uvula midline. Posterior oropharyngeal erythema present. No oropharyngeal exudate or uvula swelling.     Comments: Erythema and drainage in posterior oropharynx Cardiovascular:     Rate and Rhythm: Normal rate and regular rhythm.     Heart sounds: Normal heart sounds, S1 normal and S2 normal. No murmur heard. Pulmonary:     Effort: Pulmonary effort is normal. No accessory muscle usage or respiratory distress.     Breath sounds: Normal breath sounds. No stridor. No wheezing, rhonchi or rales.     Comments: Clear to auscultation bilaterally Neurological:     Mental Status: He is alert.  Psychiatric:        Behavior: Behavior is cooperative.      UC Treatments / Results  Labs (all labs ordered are  listed, but only abnormal results are displayed) Labs Reviewed - No data to display  EKG   Radiology No results found.  Procedures Procedures (including critical care time)  Medications Ordered in UC Medications - No data to display  Initial Impression / Assessment and Plan / UC Course  I have reviewed the triage vital  signs and the nursing notes.  Pertinent labs & imaging results that were available during my care of the patient were reviewed by me and considered in my medical decision making (see chart for details).     Patient is well-appearing, afebrile, nontoxic, nontachycardic.  Viral testing was deferred as he has no significant congestion or cough.  Suspect seasonal allergies as etiology of symptoms.  No evidence of acute infection on physical exam that warrant initiation of antibiotics.  Patient was started on Xyzal at night and given Pataday drops to help manage ocular symptoms.  Discussed that if he develops any additional symptoms he should return for reevaluation.  Strict return precautions given to which he expressed understanding.  Work excuse note provided.  Final Clinical Impressions(s) / UC Diagnoses   Final diagnoses:  Seasonal allergies     Discharge Instructions      I believe that you have seasonal allergies.  Please start Xyzal at night.  Use Pataday twice a day for itchy/watery eyes.  Use a humidifier in your room at night.  Follow-up with your primary care if symptoms or not improving.  If anything changes please return for reevaluation.     ED Prescriptions     Medication Sig Dispense Auth. Provider   levocetirizine (XYZAL ALLERGY 24HR) 5 MG tablet Take 1 tablet (5 mg total) by mouth every evening. 30 tablet Ibn Stief K, PA-C   olopatadine (PATADAY) 0.1 % ophthalmic solution Place 1 drop into both eyes 2 (two) times daily. 5 mL Numair Masden K, PA-C      PDMP not reviewed this encounter.   Jeani Hawking, PA-C 11/08/21 1058

## 2021-11-09 ENCOUNTER — Other Ambulatory Visit: Payer: Self-pay

## 2021-11-10 ENCOUNTER — Ambulatory Visit (INDEPENDENT_AMBULATORY_CARE_PROVIDER_SITE_OTHER): Payer: Self-pay | Admitting: Physical Medicine and Rehabilitation

## 2021-11-10 DIAGNOSIS — R202 Paresthesia of skin: Secondary | ICD-10-CM

## 2021-11-10 NOTE — Progress Notes (Signed)
Right hand pain with N/T

## 2021-11-11 NOTE — Procedures (Signed)
EMG & NCV Findings: Evaluation of the right radial motor nerve showed reduced amplitude (1.4 mV) and decreased conduction velocity (Up Arm-8cm, 57 m/s).  All remaining nerves (as indicated in the following tables) were within normal limits.    All examined muscles (as indicated in the following table) showed no evidence of electrical instability.    Impression: Essentially NORMAL electrodiagnostic study of the right upper limb.  There is no significant electrodiagnostic evidence of nerve entrapment, brachial plexopathy or cervical radiculopathy.    As you know, purely sensory or demyelinating radiculopathies and chemical radiculitis may not be detected with this particular electrodiagnostic study. **I did not complete specific nerve conduction across the brachial plexus [name] the patient's sensitivity with everything completed today.  I do not think that that would reveal anything given the rest of the test and his clinical symptoms.  I would note that the patient was extremely sensitive to doing the test.  Recommendations: 1.  Follow-up with referring physician. 2.  Continue current management of symptoms.  ___________________________ Gabriel Ball FAAPMR Board Certified, American Board of Physical Medicine and Rehabilitation    Nerve Conduction Studies Anti Sensory Summary Table   Stim Site NR Peak (ms) Norm Peak (ms) P-T Amp (V) Norm P-T Amp Site1 Site2 Delta-P (ms) Dist (cm) Vel (m/s) Norm Vel (m/s)  Right Med Ante Brach Cutan Anti Sensory (Med Forearm)  29C  Elbow    4.7  78.9  Elbow Med Forearm 4.7 10.0 21   Right Median Acr Palm Anti Sensory (2nd Digit)  28.8C  Wrist    3.6 <3.6 54.5 >10 Wrist Palm 1.7 0.0    Palm    1.9 <2.0 52.3             3.6  12.5         Right Radial Anti Sensory (Base 1st Digit)  28.7C  Wrist    2.2 <3.1 25.1  Wrist Base 1st Digit 2.2 0.0    Right Ulnar Anti Sensory (5th Digit)  29C  Wrist    3.5 <3.7 34.7 >15.0 Wrist 5th Digit 3.5 14.0 40 >38    Motor Summary Table   Stim Site NR Onset (ms) Norm Onset (ms) O-P Amp (mV) Norm O-P Amp Site1 Site2 Delta-0 (ms) Dist (cm) Vel (m/s) Norm Vel (m/s)  Right Median Motor (Abd Poll Brev)  28.9C  Wrist    3.9 <4.2 6.9 >5 Elbow Wrist 3.9 21.0 54 >50  Elbow    7.8  6.7         Right Radial Motor (Ext Indicis)  27.2C  8cm    2.5 <2.5 *1.4 >1.7 Up Arm 8cm 3.6 20.5 *57 >60  Up Arm    6.1  3.4         Right Ulnar Motor (Abd Dig Min)  28.9C  Wrist    3.3 <4.2 11.1 >3 B Elbow Wrist 3.5 21.0 60 >53  B Elbow    6.8  5.0  A Elbow B Elbow 1.0 10.0 100 >53  A Elbow    7.8  8.4          EMG   Side Muscle Nerve Root Ins Act Fibs Psw Amp Dur Poly Recrt Int [name] Comment  Right 1stDorInt Ulnar C8-T1 Nml Nml Nml Nml Nml 0 Nml Nml   Right Abd Poll Brev Median C8-T1 Nml Nml Nml Nml Nml 0 Nml Nml   Right ExtDigCom   Nml Nml Nml Nml Nml 0 Nml Nml   Right Triceps  Radial C6-7-8 Nml Nml Nml Nml Nml 0 Nml Nml   Right Deltoid Axillary C5-6 Nml Nml Nml Nml Nml 0 Nml Nml     Nerve Conduction Studies Anti Sensory Left/Right Comparison   Stim Site L Lat (ms) R Lat (ms) L-R Lat (ms) L Amp (V) R Amp (V) L-R Amp (%) Site1 Site2 L Vel (m/s) R Vel (m/s) L-R Vel (m/s)  Med Ante Brach Cutan Anti Sensory (Med Forearm)  29C  Elbow  4.7   78.9  Elbow Med Forearm  21   Median Acr Palm Anti Sensory (2nd Digit)  28.8C  Wrist  3.6   54.5  Wrist Palm     Palm  1.9   52.3          3.6   12.5        Radial Anti Sensory (Base 1st Digit)  28.7C  Wrist  2.2   25.1  Wrist Base 1st Digit     Ulnar Anti Sensory (5th Digit)  29C  Wrist  3.5   34.7  Wrist 5th Digit  40    Motor Left/Right Comparison   Stim Site L Lat (ms) R Lat (ms) L-R Lat (ms) L Amp (mV) R Amp (mV) L-R Amp (%) Site1 Site2 L Vel (m/s) R Vel (m/s) L-R Vel (m/s)  Median Motor (Abd Poll Brev)  28.9C  Wrist  3.9   6.9  Elbow Wrist  54   Elbow  7.8   6.7        Radial Motor (Ext Indicis)  27.2C  8cm  2.5   *1.4  Up Arm 8cm  *57   Up Arm  6.1   3.4         Ulnar Motor (Abd Dig Min)  28.9C  Wrist  3.3   11.1  B Elbow Wrist  60   B Elbow  6.8   5.0  A Elbow B Elbow  100   A Elbow  7.8   8.4           Waveforms:

## 2021-11-14 ENCOUNTER — Encounter: Payer: Self-pay | Admitting: Sports Medicine

## 2021-11-17 NOTE — Progress Notes (Signed)
Gabriel Ball - 51 y.o. male MRN [id number]  Date of birth: 1970/05/31  Office Visit Note: Visit Date: 11/10/2021 PCP: Claiborne Rigg, NP Referred by: Gabriel Brunner, DO  Subjective: Chief Complaint  Patient presents with   Right Hand - Pain   HPI:  Gabriel Ball is a 51 y.o. male who comes in today at the request of Dr. Madelyn Ball for electrodiagnostic study of the Right upper extremities.  Patient is Right hand dominant.  Spanish interpreter present today.  He complains of chronic recalcitrant right shoulder arm and hand pain with nondermatomal numbness and tingling.  This was from  a fall at work on July 15, 2021 where he fell about 3 feet from a ladder and tried to grab himself on the door with her right arm and felt like he stretched the right arm and neck.  He has had extensive work-up with unrevealing imaging and failure of conservative care.  He has not had electrodiagnostic studies.  Dr. Shon Baton felt like this may be a brachial plexopathy potentially.   ROS Otherwise per HPI.  Assessment & Plan: Visit Diagnoses:    ICD-10-CM   1. Paresthesia of skin  R20.2 NCV with EMG (electromyography)      Plan: Impression: Essentially NORMAL electrodiagnostic study of the right upper limb.  There is no significant electrodiagnostic evidence of nerve entrapment, brachial plexopathy or cervical radiculopathy.    As you know, purely sensory or demyelinating radiculopathies and chemical radiculitis may not be detected with this particular electrodiagnostic study. **I did not complete specific nerve conduction across the brachial plexus [name] the patient's sensitivity with everything completed today.  I do not think that that would reveal anything given the rest of the test and his clinical symptoms.  I would note that the patient was extremely sensitive to doing the test.  Recommendations: 1.  Follow-up with referring physician. 2.  Continue current management of symptoms.  Meds  & Orders: No orders of the defined types were placed in this encounter.   Orders Placed This Encounter  Procedures   NCV with EMG (electromyography)    Follow-up: Return in about 2 weeks (around 11/24/2021) for Gabriel Brunner, DO.   Procedures: No procedures performed  EMG & NCV Findings: Evaluation of the right radial motor nerve showed reduced amplitude (1.4 mV) and decreased conduction velocity (Up Arm-8cm, 57 m/s).  All remaining nerves (as indicated in the following tables) were within normal limits.    All examined muscles (as indicated in the following table) showed no evidence of electrical instability.    Impression: Essentially NORMAL electrodiagnostic study of the right upper limb.  There is no significant electrodiagnostic evidence of nerve entrapment, brachial plexopathy or cervical radiculopathy.    As you know, purely sensory or demyelinating radiculopathies and chemical radiculitis may not be detected with this particular electrodiagnostic study. **I did not complete specific nerve conduction across the brachial plexus [name] the patient's sensitivity with everything completed today.  I do not think that that would reveal anything given the rest of the test and his clinical symptoms.  I would note that the patient was extremely sensitive to doing the test.  Recommendations: 1.  Follow-up with referring physician. 2.  Continue current management of symptoms.  ___________________________ Elease Hashimoto Board Certified, American Board of Physical Medicine and Rehabilitation    Nerve Conduction Studies Anti Sensory Summary Table   Stim Site NR Peak (ms) Norm Peak (ms) P-T Amp (V) Norm P-T Amp Site1  Site2 Delta-P (ms) Dist (cm) Vel (m/s) Norm Vel (m/s)  Right Med Ante Brach Cutan Anti Sensory (Med Forearm)  29C  Elbow    4.7  78.9  Elbow Med Forearm 4.7 10.0 21   Right Median Acr Palm Anti Sensory (2nd Digit)  28.8C  Wrist    3.6 <3.6 54.5 >10 Wrist Palm 1.7 0.0     Palm    1.9 <2.0 52.3             3.6  12.5         Right Radial Anti Sensory (Base 1st Digit)  28.7C  Wrist    2.2 <3.1 25.1  Wrist Base 1st Digit 2.2 0.0    Right Ulnar Anti Sensory (5th Digit)  29C  Wrist    3.5 <3.7 34.7 >15.0 Wrist 5th Digit 3.5 14.0 40 >38   Motor Summary Table   Stim Site NR Onset (ms) Norm Onset (ms) O-P Amp (mV) Norm O-P Amp Site1 Site2 Delta-0 (ms) Dist (cm) Vel (m/s) Norm Vel (m/s)  Right Median Motor (Abd Poll Brev)  28.9C  Wrist    3.9 <4.2 6.9 >5 Elbow Wrist 3.9 21.0 54 >50  Elbow    7.8  6.7         Right Radial Motor (Ext Indicis)  27.2C  8cm    2.5 <2.5 *1.4 >1.7 Up Arm 8cm 3.6 20.5 *57 >60  Up Arm    6.1  3.4         Right Ulnar Motor (Abd Dig Min)  28.9C  Wrist    3.3 <4.2 11.1 >3 B Elbow Wrist 3.5 21.0 60 >53  B Elbow    6.8  5.0  A Elbow B Elbow 1.0 10.0 100 >53  A Elbow    7.8  8.4          EMG   Side Muscle Nerve Root Ins Act Fibs Psw Amp Dur Poly Recrt Int Fraser Din Comment  Right 1stDorInt Ulnar C8-T1 Nml Nml Nml Nml Nml 0 Nml Nml   Right Abd Poll Brev Median C8-T1 Nml Nml Nml Nml Nml 0 Nml Nml   Right ExtDigCom   Nml Nml Nml Nml Nml 0 Nml Nml   Right Triceps Radial C6-7-8 Nml Nml Nml Nml Nml 0 Nml Nml   Right Deltoid Axillary C5-6 Nml Nml Nml Nml Nml 0 Nml Nml     Nerve Conduction Studies Anti Sensory Left/Right Comparison   Stim Site L Lat (ms) R Lat (ms) L-R Lat (ms) L Amp (V) R Amp (V) L-R Amp (%) Site1 Site2 L Vel (m/s) R Vel (m/s) L-R Vel (m/s)  Med Ante Brach Cutan Anti Sensory (Med Forearm)  29C  Elbow  4.7   78.9  Elbow Med Forearm  21   Median Acr Palm Anti Sensory (2nd Digit)  28.8C  Wrist  3.6   54.5  Wrist Palm     Palm  1.9   52.3          3.6   12.5        Radial Anti Sensory (Base 1st Digit)  28.7C  Wrist  2.2   25.1  Wrist Base 1st Digit     Ulnar Anti Sensory (5th Digit)  29C  Wrist  3.5   34.7  Wrist 5th Digit  40    Motor Left/Right Comparison   Stim Site L Lat (ms) R Lat (ms) L-R Lat (ms) L Amp (mV)  R Amp (mV) L-R Amp (%)  Site1 Site2 L Vel (m/s) R Vel (m/s) L-R Vel (m/s)  Median Motor (Abd Poll Brev)  28.9C  Wrist  3.9   6.9  Elbow Wrist  54   Elbow  7.8   6.7        Radial Motor (Ext Indicis)  27.2C  8cm  2.5   *1.4  Up Arm 8cm  *57   Up Arm  6.1   3.4        Ulnar Motor (Abd Dig Min)  28.9C  Wrist  3.3   11.1  B Elbow Wrist  60   B Elbow  6.8   5.0  A Elbow B Elbow  100   A Elbow  7.8   8.4           Waveforms:                Clinical History: No specialty comments available.     Objective:  VS:  HT:    WT:   BMI:     BP:   HR: bpm  TEMP: ( )  RESP:  Physical Exam Musculoskeletal:        General: No tenderness.     Comments: Inspection reveals no atrophy of the bilateral APB or FDI or hand intrinsics. There is no swelling, color changes, allodynia or dystrophic changes. There is 5 out of 5 strength in the bilateral wrist extension, finger abduction and long finger flexion. There is intact sensation to light touch in all dermatomal and peripheral nerve distributions.  There is a negative Tinel's test at the bilateral wrist and elbow. There is a negative Phalen's test bilaterally. There is a negative Hoffmann's test bilaterally.  Skin:    General: Skin is warm and dry.     Findings: No erythema or rash.  Neurological:     General: No focal deficit present.     Mental Status: He is alert and oriented to person, place, and time.     Sensory: No sensory deficit.     Motor: No weakness or abnormal muscle tone.     Coordination: Coordination normal.     Gait: Gait normal.  Psychiatric:        Mood and Affect: Mood normal.        Behavior: Behavior normal.        Thought Content: Thought content normal.      Imaging: No results found.

## 2021-11-24 ENCOUNTER — Ambulatory Visit (INDEPENDENT_AMBULATORY_CARE_PROVIDER_SITE_OTHER): Payer: Self-pay | Admitting: Sports Medicine

## 2021-11-24 ENCOUNTER — Encounter: Payer: Self-pay | Admitting: Sports Medicine

## 2021-11-24 DIAGNOSIS — G8929 Other chronic pain: Secondary | ICD-10-CM

## 2021-11-24 DIAGNOSIS — M25511 Pain in right shoulder: Secondary | ICD-10-CM

## 2021-11-24 DIAGNOSIS — W108XXD Fall (on) (from) other stairs and steps, subsequent encounter: Secondary | ICD-10-CM

## 2021-11-24 DIAGNOSIS — M5442 Lumbago with sciatica, left side: Secondary | ICD-10-CM
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
## 2021-11-24 NOTE — Patient Instructions (Addendum)
Aaryn,  Here is a good chiropractor office that may be good to check-out. I hope your pain continues to improve. Keep going with the home exercises. Hopefully the chiropractor can provide some pain relief for you as well. Unfortunately, I am out of treatment options for you at this time.  [place] Phone: [phone]  - Address [address] [place], [place], [place]

## 2021-11-24 NOTE — Progress Notes (Signed)
States he is doing a little better since last visit; still has pain but not as severe. The tingling in the right hand has subsided he states

## 2021-11-27 ENCOUNTER — Other Ambulatory Visit: Payer: Self-pay

## 2021-11-27 ENCOUNTER — Other Ambulatory Visit: Payer: Self-pay | Admitting: Nurse Practitioner

## 2021-11-27 NOTE — Telephone Encounter (Signed)
Requested medication (s) are due for refill today - no  Requested medication (s) are on the active medication list -no  Future visit scheduled -no  Last refill: 07/16/21  Notes to clinic: medication no longer on current medication list, outside provider  Requested Prescriptions  Pending Prescriptions Disp Refills   valACYclovir (VALTREX) 1000 MG tablet 20 tablet 0    Sig: take 1 tab by mouth once daily     Antimicrobials:  Antiviral Agents - Anti-Herpetic Passed - 11/27/2021  4:02 PM      Passed - Valid encounter within last 12 months    Recent Outpatient Visits           2 months ago Encounter for annual physical exam   [facility] [place], Shea Stakes, NP   1 year ago Skin infection   [facility] [place], Marzella [place], [place]   2 years ago Encounter for annual physical exam   [facility] [place], [place] W, NP   2 years ago Paresthesias   [facility] [place], [place], [place]   2 years ago HSV (herpes simplex virus) infection   [place] [address] [facility] [place], [place], [place]                 Requested Prescriptions  Pending Prescriptions Disp Refills   valACYclovir (VALTREX) 1000 MG tablet 20 tablet 0    Sig: take 1 tab by mouth once daily     Antimicrobials:  Antiviral Agents - Anti-Herpetic Passed - 11/27/2021  4:02 PM      Passed - Valid encounter within last 12 months    Recent Outpatient Visits           2 months ago Encounter for annual physical exam   [facility] [place], Shea Stakes, NP   1 year ago Skin infection   [facility] [place], [place], [place]   2 years ago Encounter for annual physical exam   [facility] [place], Shea Stakes, NP   2 years ago Paresthesias   [facility] [place], [place], [place]   2 years ago  HSV (herpes simplex virus) infection   [facility] [place], [place], [place]

## 2021-11-29 MED ORDER — VALACYCLOVIR HCL 1 G PO TABS
1000.0000 mg | ORAL_TABLET | Freq: Every day | ORAL | 0 refills | Status: DC
  Filled 2021-11-29 – 2021-11-30 (×2): qty 20, 20d supply, fill #0

## 2021-11-30 ENCOUNTER — Other Ambulatory Visit: Payer: Self-pay

## 2021-12-01 ENCOUNTER — Ambulatory Visit (HOSPITAL_COMMUNITY)
Admission: EM | Admit: 2021-12-01 | Discharge: 2021-12-01 | Disposition: A | Discharge status: Home / Self Care | Payer: Self-pay | Attending: Emergency Medicine | Admitting: Emergency Medicine

## 2021-12-01 ENCOUNTER — Encounter (HOSPITAL_COMMUNITY): Payer: Self-pay

## 2021-12-01 ENCOUNTER — Other Ambulatory Visit: Payer: Self-pay

## 2021-12-01 DIAGNOSIS — J22 Unspecified acute lower respiratory infection: Secondary | ICD-10-CM

## 2021-12-01 DIAGNOSIS — L299 Pruritus, unspecified: Secondary | ICD-10-CM

## 2021-12-01 DIAGNOSIS — B009 Herpesviral infection, unspecified: Secondary | ICD-10-CM

## 2021-12-01 MED ORDER — VALACYCLOVIR HCL 1 G PO TABS: 1000.0000 mg | ORAL_TABLET | Freq: Every day | ORAL | 0 refills | Status: DC

## 2021-12-01 MED ORDER — HYDROCORTISONE 1 % EX CREA
TOPICAL_CREAM | CUTANEOUS | 0 refills | Status: DC
  Filled 2021-12-01: qty 28.4, 14d supply, fill #0

## 2021-12-01 MED ORDER — DOXYCYCLINE HYCLATE 100 MG PO TABS
100.0000 mg | ORAL_TABLET | Freq: Two times a day (BID) | ORAL | 0 refills | Status: DC
  Filled 2021-12-01: qty 14, 7d supply, fill #0

## 2021-12-01 NOTE — ED Provider Notes (Signed)
[facility]    CSN: [id number] Arrival date & time: 12/01/21  0805      History   Chief Complaint Chief Complaint  Patient presents with   Cough    [name] is a 51 y.o. male.  Presents due to productive cough x 3 weeks.  He reports cough has progressively worsened.  Patient reports SOB when coughing. Patient reports chest congestion. Patient reports night sweats at times. Patient denies any fever or chills. Patient denies chest pain.  Patient denies wheezing. Patient hasn't taken any medications for symptoms. History of Pneumonia. History of Asthma.   Patient reports pruritus in armpits that started 1 month ago. Patient denies any changes to deodorants. Patient denies rash. Patient states he's tried OTC medications with no relief of symptoms.   Patient wants refill of valtrex medication. Patient states he contacted his PCP but PCP has not contacted him and he is having an outbreak currently.  History of HSV.   Patients partner is at bedside. Translator used for interpretation.    Cough Associated symptoms: chills and shortness of breath   Associated symptoms: no chest pain, no ear pain, no fever, no rhinorrhea, no sore throat and no wheezing     Past Medical History:  Diagnosis Date   Asthma    Herpes    Shingles     Patient Active Problem List   Diagnosis Date Noted   Acute bilateral low back pain without sciatica 06/05/2020   Acute pain of right shoulder 06/05/2020   Asthma due to environmental allergies 12/20/2016   HSV (herpes simplex virus) infection 02/24/2015   Anxiety 04/02/2014    Past Surgical History:  Procedure Laterality Date   MOUTH SURGERY         Home Medications    Prior to Admission medications   Medication Sig Start Date End Date Taking? Authorizing Provider  doxycycline (VIBRAMYCIN) 100 MG capsule Take 1 capsule (100 mg total) by mouth 2 (two) times daily. 12/01/21  Yes Debby Freiberg, NP  hydrocortisone  cream 1 % Apply to affected area 2 times daily 12/01/21  Yes Shanon Seawright, Hall Busing, NP  acetaminophen-codeine (TYLENOL #3) 300-30 MG tablet Take 1 tablet by mouth every 12 (twelve) hours as needed for severe pain. 08/03/21   Claiborne Rigg, NP  amitriptyline (ELAVIL) 25 MG tablet Take 1 tablet (25 mg total) by mouth at bedtime. Start with 1/2 tablet for one week. If tolerating well, may increase to 1 tablet at bedtime 09/14/21 12/02/21  Madelyn Brunner, DO  diazepam (VALIUM) 5 MG tablet Take 5 mg by mouth every 6 (six) hours as needed for anxiety.    [provider]  hydrOXYzine (ATARAX) 25 MG tablet Take 1 tablet (25 mg total) by mouth 2 (two) times daily as needed for anxiety. 10/02/21   Bing Neighbors, FNP  levocetirizine (XYZAL ALLERGY 24HR) 5 MG tablet Take 1 tablet (5 mg total) by mouth every evening. 11/08/21   Raspet, Noberto Retort, PA-C  methocarbamol (ROBAXIN) 500 MG tablet Take 1 tablet (500 mg total) by mouth 2 (two) times daily. 10/19/21   Jeannie Fend, PA-C  olopatadine (PATADAY) 0.1 % ophthalmic solution Place 1 drop into both eyes 2 (two) times daily. 11/08/21   Raspet, Denny Peon K, PA-C  polyethylene glycol powder (MIRALAX) 17 GM/SCOOP powder Dissolve 1 capful (17 grams) in 4-8 ounces of water/juice and take by mouth 2 (two) times daily. 09/11/21   Hoy Register, MD  valACYclovir (VALTREX) 1000  MG tablet Take 1 tablet (1,000 mg total) by mouth daily. 12/01/21   Debby Freiberg, NP  SUMAtriptan (IMITREX) 50 MG tablet Take one for headache.  May repeat in 2 hours.  No more than 2 a day Patient not taking: Reported on 02/09/2018 05/27/17 07/26/18  Eustace Moore, MD    Family History Family History  Problem Relation Age of Onset   Cancer Father     Social History Social History   Tobacco Use   Smoking status: Former   Smokeless tobacco: Never  Building services engineer Use: Never used  Substance Use Topics   Alcohol use: No   Drug use: No     Allergies   Cyclobenzaprine,  Ibuprofen, Methocarbamol, Amoxicillin, and Penicillins   Review of Systems Review of Systems  Constitutional:  Positive for activity change, chills and fatigue. Negative for appetite change, fever and unexpected weight change.  HENT:  Negative for ear discharge, ear pain, postnasal drip, rhinorrhea, sinus pressure, sinus pain, sore throat, tinnitus and trouble swallowing.        Reports chest congestion   Eyes: Negative.   Respiratory:  Positive for cough and shortness of breath. Negative for apnea, choking, chest tightness, wheezing and stridor.   Cardiovascular:  Negative for chest pain and palpitations.  Gastrointestinal: Negative.   Musculoskeletal:  Negative for neck pain.  Skin:        Herpes outbreak on lips.   Denies any skin changes to axillary regions.      Physical Exam Triage Vital Signs ED Triage Vitals [12/01/21 0832]  Enc Vitals Group     BP 110/72     Pulse Rate 79     Resp 18     Temp 98.3 F (36.8 C)     Temp Source Oral     SpO2 97 %     Weight      Height      Head Circumference      Peak Flow      Pain Score 4     Pain Loc      Pain Edu?      Excl. in GC?    No data found.  Updated Vital Signs BP 110/72 (BP Location: Left Arm)   Pulse 79   Temp 98.3 F (36.8 C) (Oral)   Resp 18   SpO2 97%      Physical Exam Vitals and nursing note reviewed.  Cardiovascular:     Rate and Rhythm: Normal rate and regular rhythm.     Heart sounds: Normal heart sounds, S1 normal and S2 normal.  Pulmonary:     Effort: Pulmonary effort is normal.     Breath sounds: Examination of the right-lower field reveals decreased breath sounds. Examination of the left-lower field reveals rhonchi. Decreased breath sounds and rhonchi present.  Lymphadenopathy:     Upper Body:     Right upper body: No axillary adenopathy.     Left upper body: No axillary adenopathy.  Skin:    Findings: Rash present. Rash is vesicular.          Comments: Multiple grouped vesicular  lesions present on lower lip. No drainage present at site. Consistent with history of HSV.   Normal skin noted at axillary regions bilaterally.       UC Treatments / Results  Labs (all labs ordered are listed, but only abnormal results are displayed) Labs Reviewed - No data to display  EKG   Radiology No results  found.  Procedures Procedures (including critical care time)  Medications Ordered in UC Medications - No data to display  Initial Impression / Assessment and Plan / UC Course  I have reviewed the triage vital signs and the nursing notes.  Pertinent labs & imaging results that were available during my care of the patient were reviewed by me and considered in my medical decision making (see chart for details).     Patient was treated for acute respiratory infection.  Based on symptomology and physical assessment, patient was treated with antimicrobial medicine.  Doxycycline was used due to patients allergies to amoxicillin and penicillin.  Patient was made aware of treatment regiment and possible side effects.  Uncertain etiology of pruritus and axillary regions due to assessment, no rash was present.  Patient was given hydrocortisone cream to apply to sites.  Patient was made aware to monitor products used in sites.   Valtrex medication was refilled.  Patient made aware that he should continue to try to get in contact with his PCP for future refills.  Shared decision-making was used to refill Valtrex for 30 days due to him having difficulty getting in contact with his PCP.   Patient was made aware to follow-up if symptoms do not improve.  Patient verbalized understanding of instructions.  Final Clinical Impressions(s) / UC Diagnoses   Final diagnoses:  Acute lower respiratory infection  Pruritus  Herpes simplex virus (HSV) infection     Discharge Instructions      Se ha enviado una recarga de Valtrex a la [place], tome 1 tableta al [employer] [address]os [address]  La doxiciclina es un antibitico, este ha sido enviado a la farmacia para la infeccin respiratoria, tomar este medicamento 2 veces al da [employer] [address] [place].  Como se discuti, este medicamento puede ser duro para Investment banker, corporate, puede ser til comer algo antes de tomar este medicamento para Acupuncturist, evite cualquier producto lcteo mientras toma este medicamento.   La crema de hidrocortisona ha sido enviada a la Corporate investment banker, puede aplicar este medicamento 2 veces al da en el rea afectada.  No use este medicamento por ms de 4 semanas.       ED Prescriptions     Medication Sig Dispense Auth. Provider   doxycycline (VIBRAMYCIN) 100 MG capsule Take 1 capsule (100 mg total) by mouth 2 (two) times daily. 14 capsule Debby Freiberg, NP   hydrocortisone cream 1 % Apply to affected area 2 times daily 28.4 g Debby Freiberg, NP   valACYclovir (VALTREX) 1000 MG tablet Take 1 tablet (1,000 mg total) by mouth daily. 30 tablet Debby Freiberg, NP      PDMP not reviewed this encounter.   Debby Freiberg, NP 12/01/21 1017

## 2021-12-01 NOTE — ED Triage Notes (Signed)
Pt c/o cough and congestion x3 wks, blisters to lips since last night, and itchy armpits for a while. States using baby powder to armpits with no relief.

## 2021-12-01 NOTE — Discharge Instructions (Addendum)
Se ha enviado una recarga de Valtrex a la [place], tome 1 tableta al [employer] [address]os [address]  La doxiciclina es un antibitico, este ha sido enviado a la farmacia para la infeccin respiratoria, tomar este medicamento 2 veces al da [employer] [address] [place].  Como se discuti, este medicamento puede ser duro para Investment banker, corporate, puede ser til comer algo antes de tomar este medicamento para Acupuncturist, evite cualquier producto lcteo mientras toma este medicamento.   La crema de hidrocortisona ha sido enviada a la Corporate investment banker, puede aplicar este medicamento 2 veces al da en el rea afectada.  No use este medicamento por ms de 4 semanas.

## 2021-12-03 ENCOUNTER — Encounter (HOSPITAL_COMMUNITY): Payer: Self-pay | Admitting: Emergency Medicine

## 2021-12-03 ENCOUNTER — Other Ambulatory Visit: Payer: Self-pay

## 2021-12-03 ENCOUNTER — Ambulatory Visit (HOSPITAL_COMMUNITY)
Admission: EM | Admit: 2021-12-03 | Discharge: 2021-12-03 | Disposition: A | Discharge status: Home / Self Care | Payer: Self-pay | Attending: Family Medicine | Admitting: Family Medicine

## 2021-12-03 ENCOUNTER — Ambulatory Visit (INDEPENDENT_AMBULATORY_CARE_PROVIDER_SITE_OTHER): Payer: Self-pay

## 2021-12-03 DIAGNOSIS — R051 Acute cough: Secondary | ICD-10-CM

## 2021-12-03 DIAGNOSIS — R197 Diarrhea, unspecified: Secondary | ICD-10-CM

## 2021-12-03 DIAGNOSIS — R059 Cough, unspecified: Secondary | ICD-10-CM

## 2021-12-03 DIAGNOSIS — R1030 Lower abdominal pain, unspecified: Secondary | ICD-10-CM

## 2021-12-03 MED ORDER — DICYCLOMINE HCL 20 MG PO TABS
20.0000 mg | ORAL_TABLET | Freq: Four times a day (QID) | ORAL | 0 refills | Status: DC | PRN
  Filled 2021-12-03: qty 40, 10d supply, fill #0

## 2021-12-03 MED ORDER — BENZONATATE 100 MG PO CAPS
100.0000 mg | ORAL_CAPSULE | Freq: Three times a day (TID) | ORAL | 0 refills | Status: DC | PRN
  Filled 2021-12-03: qty 21, 7d supply, fill #0

## 2021-12-03 NOTE — ED Provider Notes (Signed)
[facility]    CSN: [id number] Arrival date & time: 12/03/21  1851      History   Chief Complaint Chief Complaint  Patient presents with   Abdominal Pain    [name] is a 51 y.o. male.    Abdominal Pain  Here for lower abdominal pain and diarrhea.  For the last 2 days he has had diarrhea up to about 10 times a day.  He is also having lower abdominal pain.  On the 14th he was seen for his cough that is been going on for about 4 weeks.  He was sent in doxycycline for bronchitis.  He states that since he started that medication he is having the abdominal pain and diarrhea. No nausea or vomiting.  No blood in the stool.  No fever   Past Medical History:  Diagnosis Date   Asthma    Herpes    Shingles     Patient Active Problem List   Diagnosis Date Noted   Acute bilateral low back pain without sciatica 06/05/2020   Acute pain of right shoulder 06/05/2020   Asthma due to environmental allergies 12/20/2016   HSV (herpes simplex virus) infection 02/24/2015   Anxiety 04/02/2014    Past Surgical History:  Procedure Laterality Date   MOUTH SURGERY         Home Medications    Prior to Admission medications   Medication Sig Start Date End Date Taking? Authorizing Provider  benzonatate (TESSALON) 100 MG capsule Take 1 capsule (100 mg total) by mouth 3 (three) times daily as needed for cough. 12/03/21  Yes Zenia Resides, MD  dicyclomine (BENTYL) 20 MG tablet Take 1 tablet (20 mg total) by mouth 4 (four) times daily as needed (intestinal cramps). 12/03/21  Yes Zenia Resides, MD  amitriptyline (ELAVIL) 25 MG tablet Take 1 tablet (25 mg total) by mouth at bedtime. Start with 1/2 tablet for one week. If tolerating well, may increase to 1 tablet at bedtime 09/14/21 12/02/21  Madelyn Brunner, DO  diazepam (VALIUM) 5 MG tablet Take 5 mg by mouth every 6 (six) hours as needed for anxiety.    [provider]  hydrocortisone cream 1 % Apply  to affected area 2 times daily 12/01/21   Debby Freiberg, NP  hydrOXYzine (ATARAX) 25 MG tablet Take 1 tablet (25 mg total) by mouth 2 (two) times daily as needed for anxiety. 10/02/21   Bing Neighbors, FNP  levocetirizine (XYZAL ALLERGY 24HR) 5 MG tablet Take 1 tablet (5 mg total) by mouth every evening. 11/08/21   Raspet, Noberto Retort, PA-C  methocarbamol (ROBAXIN) 500 MG tablet Take 1 tablet (500 mg total) by mouth 2 (two) times daily. 10/19/21   Jeannie Fend, PA-C  olopatadine (PATADAY) 0.1 % ophthalmic solution Place 1 drop into both eyes 2 (two) times daily. 11/08/21   Raspet, Denny Peon K, PA-C  polyethylene glycol powder (MIRALAX) 17 GM/SCOOP powder Dissolve 1 capful (17 grams) in 4-8 ounces of water/juice and take by mouth 2 (two) times daily. 09/11/21   Hoy Register, MD  valACYclovir (VALTREX) 1000 MG tablet Take 1 tablet (1,000 mg total) by mouth daily. 12/01/21   Debby Freiberg, NP  SUMAtriptan (IMITREX) 50 MG tablet Take one for headache.  May repeat in 2 hours.  No more than 2 a day Patient not taking: Reported on 02/09/2018 05/27/17 07/26/18  Eustace Moore, MD    Family History Family History  Problem Relation Age  of Onset   Cancer Father     Social History Social History   Tobacco Use   Smoking status: Former   Smokeless tobacco: Never  Building services engineer Use: Never used  Substance Use Topics   Alcohol use: No   Drug use: No     Allergies   Cyclobenzaprine, Ibuprofen, Methocarbamol, Amoxicillin, and Penicillins   Review of Systems Review of Systems  Gastrointestinal:  Positive for abdominal pain.     Physical Exam Triage Vital Signs ED Triage Vitals  Enc Vitals Group     BP 12/03/21 1948 116/62     Pulse Rate 12/03/21 1948 83     Resp 12/03/21 1948 20     Temp 12/03/21 1948 98.3 F (36.8 C)     Temp Source 12/03/21 1948 Oral     SpO2 12/03/21 1948 96 %     Weight --      Height --      Head Circumference --      Peak Flow --      Pain  Score 12/03/21 1951 7     Pain Loc --      Pain Edu? --      Excl. in GC? --    No data found.  Updated Vital Signs BP 116/62 (BP Location: Right Arm)   Pulse 83   Temp 98.3 F (36.8 C) (Oral)   Resp 20   SpO2 96%   Visual Acuity Right Eye Distance:   Left Eye Distance:   Bilateral Distance:    Right Eye Near:   Left Eye Near:    Bilateral Near:     Physical Exam Vitals reviewed.  Constitutional:      General: He is not in acute distress.    Appearance: He is not ill-appearing, toxic-appearing or diaphoretic.  HENT:     Nose: Nose normal.     Mouth/Throat:     Mouth: Mucous membranes are moist.     Pharynx: No oropharyngeal exudate or posterior oropharyngeal erythema.  Eyes:     Extraocular Movements: Extraocular movements intact.     Conjunctiva/sclera: Conjunctivae normal.     Pupils: Pupils are equal, round, and reactive to light.  Cardiovascular:     Rate and Rhythm: Normal rate and regular rhythm.     Heart sounds: No murmur heard. Pulmonary:     Effort: Pulmonary effort is normal. No respiratory distress.     Breath sounds: No stridor. No wheezing, rhonchi or rales.  Abdominal:     Palpations: Abdomen is soft.     Comments: Abdomen is soft but he is quite tender in the entire abdomen.  Normoactive bowel sounds present.  No distention and no guarding  Musculoskeletal:     Cervical back: Neck supple.  Lymphadenopathy:     Cervical: No cervical adenopathy.  Skin:    Capillary Refill: Capillary refill takes less than 2 seconds.     Coloration: Skin is not pale.  Neurological:     General: No focal deficit present.     Mental Status: He is alert and oriented to person, place, and time.  Psychiatric:        Behavior: Behavior normal.      UC Treatments / Results  Labs (all labs ordered are listed, but only abnormal results are displayed) Labs Reviewed - No data to display  EKG   Radiology DG Chest 2 View  Result Date: 12/03/2021 CLINICAL  DATA:  Cough for 4 weeks. EXAM:  CHEST - 2 VIEW COMPARISON:  07/14/2021 FINDINGS: The heart size and mediastinal contours are within normal limits. Both lungs are clear. The visualized skeletal structures are unremarkable. IMPRESSION: No active cardiopulmonary disease. Electronically Signed   By: Danae Orleans M.D.   On: 12/03/2021 20:34    Procedures Procedures (including critical care time)  Medications Ordered in UC Medications - No data to display  Initial Impression / Assessment and Plan / UC Course  I have reviewed the triage vital signs and the nursing notes.  Pertinent labs & imaging results that were available during my care of the patient were reviewed by me and considered in my medical decision making (see chart for details).     He asked me if he needs a "exam of his saliva".  His cough has been going on for 4 weeks, I am going to do a chest x-ray to verify his need for the antibiotics.  Chest x-ray does not show any infiltrate or fluid.  I am asking him to stop taking the doxycycline.  I am going to send in [facility] for the cough; dicyclomine for cramping, and I am going to have him take Imodium as needed for the diarrhea.  I am not going to do any stool testing since this is only 2 days into the problem Final Clinical Impressions(s) / UC Diagnoses   Final diagnoses:  Acute cough  Lower abdominal pain  Diarrhea, unspecified type     Discharge Instructions      The x-ray did not show any pneumonia(no pulmonia en los radiografias)  Take benzonatate 100 mg, 1 tab every 8 hours as needed for cough. (1 cada 8 horas cuando tiene tos)  Dicyclomine--take 1 every 6 hours as needed for intestinal cramps( 1 cada 6 horas cuando tiene colicos)  Take Imodium as needed for the diarrhea  (tome imodium para diarrhea).  Stop taking the doxycycline(ya no tome la doxycycline)      ED Prescriptions     Medication Sig Dispense Auth. Provider   benzonatate (TESSALON) 100 MG  capsule Take 1 capsule (100 mg total) by mouth 3 (three) times daily as needed for cough. 21 capsule Zenia Resides, MD   dicyclomine (BENTYL) 20 MG tablet Take 1 tablet (20 mg total) by mouth 4 (four) times daily as needed (intestinal cramps). 40 tablet Tarnisha Kachmar, Janace Aris, MD      PDMP not reviewed this encounter.   Zenia Resides, MD 12/03/21 2055

## 2021-12-03 NOTE — ED Triage Notes (Addendum)
Tuesday was seen and prescribed medicine.  Stomach is hurting, back is hurting and is having diarrhea intermittently.  Patient is asking if stomach issues related to medicine.  Says he has never felt this way due to a medicine

## 2021-12-03 NOTE — Discharge Instructions (Signed)
The x-ray did not show any pneumonia(no pulmonia en los radiografias)  Take benzonatate 100 mg, 1 tab every 8 hours as needed for cough. (1 cada 8 horas cuando tiene tos)  Dicyclomine--take 1 every 6 hours as needed for intestinal cramps( 1 cada 6 horas cuando tiene colicos)  Take Imodium as needed for the diarrhea  (tome imodium para diarrhea).  Stop taking the doxycycline(ya no tome la doxycycline)

## 2021-12-04 ENCOUNTER — Other Ambulatory Visit: Payer: Self-pay

## 2021-12-04 ENCOUNTER — Emergency Department (HOSPITAL_COMMUNITY)
Admission: EM | Admit: 2021-12-04 | Discharge: 2021-12-04 | Disposition: A | Discharge status: Home / Self Care | Payer: Self-pay | Attending: Emergency Medicine | Admitting: Emergency Medicine

## 2021-12-04 ENCOUNTER — Encounter (HOSPITAL_COMMUNITY): Payer: Self-pay

## 2021-12-04 DIAGNOSIS — R1084 Generalized abdominal pain: Secondary | ICD-10-CM | POA: Insufficient documentation

## 2021-12-04 DIAGNOSIS — R079 Chest pain, unspecified: Secondary | ICD-10-CM | POA: Insufficient documentation

## 2021-12-04 LAB — CBC WITH DIFFERENTIAL/PLATELET
Abs Immature Granulocytes: 0.07 10*3/uL (ref 0.00–0.07)
Basophils Absolute: 0 10*3/uL (ref 0.0–0.1)
Basophils Relative: 1 %
Eosinophils Absolute: 0.1 10*3/uL (ref 0.0–0.5)
Eosinophils Relative: 1 %
HCT: 45.4 % (ref 39.0–52.0)
Hemoglobin: 15.7 g/dL (ref 13.0–17.0)
Immature Granulocytes: 1 %
Lymphocytes Relative: 12 %
Lymphs Abs: 1 10*3/uL (ref 0.7–4.0)
MCH: 29.5 pg (ref 26.0–34.0)
MCHC: 34.6 g/dL (ref 30.0–36.0)
MCV: 85.2 fL (ref 80.0–100.0)
Monocytes Absolute: 0.4 10*3/uL (ref 0.1–1.0)
Monocytes Relative: 5 %
Neutro Abs: 6.8 10*3/uL (ref 1.7–7.7)
Neutrophils Relative %: 80 %
Platelets: 239 10*3/uL (ref 150–400)
RBC: 5.33 MIL/uL (ref 4.22–5.81)
RDW: 12.9 % (ref 11.5–15.5)
WBC: 8.3 10*3/uL (ref 4.0–10.5)
nRBC: 0 % (ref 0.0–0.2)

## 2021-12-04 LAB — COMPREHENSIVE METABOLIC PANEL
ALT: 26 U/L (ref 0–44)
AST: 27 U/L (ref 15–41)
Albumin: 4.4 g/dL (ref 3.5–5.0)
Alkaline Phosphatase: 69 U/L (ref 38–126)
Anion gap: 11 (ref 5–15)
BUN: 7 mg/dL (ref 6–20)
CO2: 23 mmol/L (ref 22–32)
Calcium: 9.3 mg/dL (ref 8.9–10.3)
Chloride: 103 mmol/L (ref 98–111)
Creatinine, Ser: 0.73 mg/dL (ref 0.61–1.24)
GFR, Estimated: >60 mL/min (ref >60)
Glucose, Bld: 114 mg/dL — ABNORMAL HIGH (ref 70–99)
Potassium: 3.2 mmol/L — ABNORMAL LOW (ref 3.5–5.1)
Sodium: 137 mmol/L (ref 135–145)
Total Bilirubin: 0.8 mg/dL (ref 0.3–1.2)
Total Protein: 7.2 g/dL (ref 6.5–8.1)

## 2021-12-04 LAB — LIPASE, BLOOD: Lipase: 34 U/L (ref 11–51)

## 2021-12-04 LAB — TROPONIN I (HIGH SENSITIVITY): Troponin I (High Sensitivity): 4 ng/L (ref <18)

## 2021-12-04 MED ORDER — POTASSIUM CHLORIDE CRYS ER 20 MEQ PO TBCR
40.0000 meq | EXTENDED_RELEASE_TABLET | Freq: Once | ORAL | Status: AC
  Administered 2021-12-04: 40 meq via ORAL
  Filled 2021-12-04: qty 2

## 2021-12-04 MED ORDER — LIDOCAINE VISCOUS HCL 2 % MT SOLN
15.0000 mL | Freq: Once | OROMUCOSAL | Status: AC
  Administered 2021-12-04: 15 mL via ORAL
  Filled 2021-12-04: qty 15

## 2021-12-04 MED ORDER — ALUM & MAG HYDROXIDE-SIMETH 200-200-20 MG/5ML PO SUSP
30.0000 mL | Freq: Once | ORAL | Status: AC
  Administered 2021-12-04: 30 mL via ORAL
  Filled 2021-12-04: qty 30

## 2021-12-04 NOTE — ED Provider Notes (Signed)
[facility] EMERGENCY DEPARTMENT Provider Note   CSN: [id number] Arrival date & time: 12/04/21  1438     History  Chief Complaint  Patient presents with   Chest Pain    Gabriel Ball is a 51 year old male with no past medical history who presents to the emergency department for abdominal and chest discomfort.  The patient states that he was prescribed doxycycline 3 days ago at urgent care for upper respiratory infection for a cough, and after starting it he began having generalized abdominal pain and diarrhea.  He was then seen again at urgent care yesterday, where they felt that his symptoms were secondary to medication side effect and discontinued his doxycycline after chest x-ray showed no signs of pneumonia.  He was given a prescription for Bentyl at that time, but is only taken 1 dose so far.  Patient states that he was worried that he did not receive a full work-up for his symptoms and represented here today.  His diarrhea has since stopped.  He also complains of pain in his chest, which is mild and nonexertional and nonpleuritic.  He states that it feels like his muscles are inflamed.  He denies any fevers, dysuria, hematuria, history of kidney stones, leg swelling, recent surgery, or prior DVT.  He has no family history of early MI.  The history is provided by the patient.  Chest Pain      Home Medications Prior to Admission medications   Medication Sig Start Date End Date Taking? Authorizing Provider  amitriptyline (ELAVIL) 25 MG tablet Take 1 tablet (25 mg total) by mouth at bedtime. Start with 1/2 tablet for one week. If tolerating well, may increase to 1 tablet at bedtime 09/14/21 12/02/21  Madelyn Brunner, DO  benzonatate (TESSALON) 100 MG capsule Take 1 capsule (100 mg total) by mouth 3 (three) times daily as needed for cough. 12/03/21   Zenia Resides, MD  diazepam (VALIUM) 5 MG tablet Take 5 mg by mouth every 6 (six) hours as needed for anxiety.     [provider]  dicyclomine (BENTYL) 20 MG tablet Take 1 tablet (20 mg total) by mouth 4 (four) times daily as needed (intestinal cramps). 12/03/21   Zenia Resides, MD  hydrocortisone cream 1 % Apply to affected area 2 times daily 12/01/21   Debby Freiberg, NP  hydrOXYzine (ATARAX) 25 MG tablet Take 1 tablet (25 mg total) by mouth 2 (two) times daily as needed for anxiety. 10/02/21   Bing Neighbors, FNP  levocetirizine (XYZAL ALLERGY 24HR) 5 MG tablet Take 1 tablet (5 mg total) by mouth every evening. 11/08/21   Raspet, Noberto Retort, PA-C  methocarbamol (ROBAXIN) 500 MG tablet Take 1 tablet (500 mg total) by mouth 2 (two) times daily. 10/19/21   Jeannie Fend, PA-C  olopatadine (PATADAY) 0.1 % ophthalmic solution Place 1 drop into both eyes 2 (two) times daily. 11/08/21   Raspet, Denny Peon K, PA-C  polyethylene glycol powder (MIRALAX) 17 GM/SCOOP powder Dissolve 1 capful (17 grams) in 4-8 ounces of water/juice and take by mouth 2 (two) times daily. 09/11/21   Hoy Register, MD  valACYclovir (VALTREX) 1000 MG tablet Take 1 tablet (1,000 mg total) by mouth daily. 12/01/21   Debby Freiberg, NP  SUMAtriptan (IMITREX) 50 MG tablet Take one for headache.  May repeat in 2 hours.  No more than 2 a day Patient not taking: Reported on 02/09/2018 05/27/17 07/26/18  Eustace Moore, MD  Allergies    Cyclobenzaprine, Ibuprofen, Methocarbamol, Amoxicillin, and Penicillins    Review of Systems   Review of Systems  Cardiovascular:  Positive for chest pain.    Physical Exam Updated Vital Signs BP 109/71 (BP Location: Left Arm)   Pulse 67   Temp 98.2 F (36.8 C) (Oral)   Resp 18   Ht 5\' 5"  (1.651 m)   Wt 79.8 kg   SpO2 96%   BMI 29.29 kg/m   Physical Exam Vitals and nursing note reviewed.  Constitutional:      General: He is not in acute distress.    Appearance: He is not toxic-appearing.  HENT:     Head: Normocephalic and atraumatic.  Eyes:     Extraocular Movements:  Extraocular movements intact.  Cardiovascular:     Rate and Rhythm: Normal rate and regular rhythm.     Pulses:          Radial pulses are 2+ on the right side and 2+ on the left side.     Heart sounds: Normal heart sounds.  Pulmonary:     Effort: Pulmonary effort is normal.     Breath sounds: Normal breath sounds. No wheezing, rhonchi or rales.  Abdominal:     Palpations: Abdomen is soft.     Tenderness: There is no guarding or rebound.     Comments: Abdomen soft, normoactive bowel sounds.  Very mild diffuse tenderness to palpation.   Musculoskeletal:     Right lower leg: No tenderness. No edema.     Left lower leg: No tenderness. No edema.  Skin:    General: Skin is warm and dry.     Capillary Refill: Capillary refill takes less than 2 seconds.  Neurological:     General: No focal deficit present.     Mental Status: He is alert and oriented to person, place, and time.     Motor: No weakness.     ED Results / Procedures / Treatments   Labs (all labs ordered are listed, but only abnormal results are displayed) Labs Reviewed  COMPREHENSIVE METABOLIC PANEL - Abnormal; Notable for the following components:      Result Value   Potassium 3.2 (*)    Glucose, Bld 114 (*)    All other components within normal limits  LIPASE, BLOOD  CBC WITH DIFFERENTIAL/PLATELET  TROPONIN I (HIGH SENSITIVITY)    EKG EKG Interpretation  Date/Time:  Friday December 04 2021 14:56:45 EST Ventricular Rate:  100 PR Interval:  148 QRS Duration: 92 QT Interval:  348 QTC Calculation: 448 R Axis:   35 Text Interpretation: Normal sinus rhythm Normal ECG When compared with ECG of 21-Jul-2019 21:52, PREVIOUS ECG IS PRESENT Confirmed by 23-Jul-2019 [phone]) on 12/04/2021 9:09:58 PM  Radiology DG Chest 2 View  Result Date: 12/03/2021 CLINICAL DATA:  Cough for 4 weeks. EXAM: CHEST - 2 VIEW COMPARISON:  07/14/2021 FINDINGS: The heart size and mediastinal contours are within normal limits. Both lungs  are clear. The visualized skeletal structures are unremarkable. IMPRESSION: No active cardiopulmonary disease. Electronically Signed   By: 07/16/2021 M.D.   On: 12/03/2021 20:34    Procedures Procedures    Medications Ordered in ED Medications  alum & mag hydroxide-simeth (MAALOX/MYLANTA) 200-200-20 MG/5ML suspension 30 mL (30 mLs Oral Given 12/04/21 2204)    And  lidocaine (XYLOCAINE) 2 % viscous mouth solution 15 mL (15 mLs Oral Given 12/04/21 2204)  potassium chloride SA (KLOR-CON M) CR tablet 40 mEq (40 mEq Oral  Given 12/04/21 2204)    ED Course/ Medical Decision Making/ A&P Clinical Course as of 12/04/21 2321  Fri Dec 04, 2021  2154 51 yo male presenting with nausea, diarrhea, after starting doxycycline 3 days ago for URI type symptoms.  Patient had normal chest xray yesterday, [facility] felt to be experiencing side effects.  On exam labs normal, mild hypoK 3.3, lipase and LFT wnl.  WBC wnl. ECG shows no ischemic findings. Low suspicion for ACS/PE, or surgical abdominal emergency. [MT]    Clinical Course User Index [MT] Terald Sleeperrifan, Matthew J, MD                           Medical Decision Making Problems Addressed: Chest pain, unspecified type: acute illness or injury that poses a threat to life or bodily functions Generalized abdominal pain: complicated acute illness or injury  Amount and/or Complexity of Data Reviewed External Data Reviewed: labs, radiology and notes. Labs: ordered. Decision-making details documented in ED Course. Radiology: independent interpretation performed. Decision-making details documented in ED Course.  Risk OTC drugs. Prescription drug management.   Delanna NoticeJorge Ball Ball is a 51 year old male with no past medical history who presents to the emergency department for abdominal and chest discomfort which started after he initiated doxycycline for concerns for upper respiratory infection.  Patient seen at Urgent Care yesterday for the  same. Per records, doxycycline was discontinued and Bentyl was prescribed for abdominal cramping.  Patient states that diarrhea has since stopped, but he continues to have abdominal and chest discomfort.  Overall I feel symptoms are most likely secondary to doxycycline side effect.  Lower suspicion for pancreatitis as pain does not localize to the epigastrium, patient has no significant alcohol use, no history of gallstones, however will obtain lipase to further evaluate.  No right upper quadrant localization of pain or Murphy sign on exam to suggest biliary pathology.  Patient denies history of kidney stones and has no flank or groin pain, or hematuria to suggest nephrolithiasis.  No CVA tenderness on exam.  No urinary changes to suggest UTI.  Is abdomen is soft with normal active bowel sounds and nonperitoneal, doubt acute surgical emergency including obstruction.  With regards to his chest discomfort, feel this may be secondary to dyspepsia or recent coughing.  No fevers, and on my independent review of chest x-ray performed at urgent care yesterday, there is no focal consolidation to suggest pneumonia.  Feel pericarditis and myocarditis less likely, however will obtain EKG to further evaluate and single troponin.  Patient without history of ACS or significant risk factors, and overall pain is atypical for ACS however will obtain single troponin to evaluate.  Workup: CBC without leukocytosis to suggest significant infection, hemoglobin within normal limits at 15.7.  CMP with mild hypokalemia to 3.2, repleted in emergency department.  Glucose within normal limits, creatinine within normal limits at 0.73, and LFTs normal.  No other electrolyte derangements, anion gap within normal limits.  Lipase normal.  Troponin within normal limits at 4.  EKG on my independent review demonstrates normal sinus rhythm, 100 bpm.  No ST elevations or depressions, no T wave inversions or hyperacute T waves to suggest acute  ischemia.  The PR, QRS, and QTc are appropriate.  No high-grade conduction block or arrhythmia.  Overall unremarkable ECG.  On reassessment, patient mains hemodynamically stable and well-appearing.  He reports that his symptoms have improved after GI cocktail.  Patient able to tolerate  p.o. in the emergency department.  Abdominal exam remains benign.  I feel that symptoms are most likely secondary to doxycycline side effect.  Patient instructed to not restart this medication, and to continue taking the Bentyl as prescribed for abdominal pain or cramps.  He was given an strict return precautions, and instructed to follow-up with his PCP in 2 to 3 days if symptoms do not improve.  Patient voiced understanding of and agreement with this plan, and was discharged in stable condition.        Final Clinical Impression(s) / ED Diagnoses Final diagnoses:  Chest pain, unspecified type  Generalized abdominal pain    Rx / DC Orders ED Discharge Orders     None         Stephanie Coup, MD 12/04/21 2321    Terald Sleeper, MD 12/04/21 2324

## 2021-12-04 NOTE — ED Triage Notes (Signed)
Triage done using spanish ipad interpreter: Pt reports he was prescribed doxycycline 3 days ago for a cough. He states since he has been taking that medication he has had abd pain, abd bloating, headache and diarrhea.

## 2021-12-04 NOTE — ED Provider Triage Note (Signed)
Emergency Medicine Provider Triage Evaluation Note  Gabriel Ball , a 51 y.o. male  was evaluated in triage.  Pt complains of lower abdominal pain, bloating, and diarrhea.  He was seen for same in UC yesterday.  States his symptoms started when he was put on doxycycline.  He was told to discontinue doxycycline by UC, still having symptoms, he is most concerned about his stomach being bloated and painful, pain radiates from his abdomen to his back and chest.  States he had 15 episodes of diarrhea yesterday.  Denies fever, chills, urinary changes, vomiting.   Review of Systems  Positive: See above Negative: See above  Physical Exam  BP 118/75 (BP Location: Left Arm)   Pulse (!) 101   Temp 99 F (37.2 C)   Resp 16   Ht 5\' 5"  (1.651 m)   Wt 79.8 kg   SpO2 98%   BMI 29.29 kg/m  Gen:   Awake, no distress   Resp:  Normal effort  MSK:   Moves extremities without difficulty  Other:  Generalized abdominal tenderness, mild abdominal distension   Medical Decision Making  Medically screening exam initiated at 3:53 PM.  Appropriate orders placed.  Gabriel Ball was informed that the remainder of the evaluation will be completed by another provider, this initial triage assessment does not replace that evaluation, and the importance of remaining in the ED until their evaluation is complete.     Gabriel Ball R, Melton Alar 12/04/21 1556

## 2021-12-04 NOTE — Discharge Instructions (Signed)
Lo atienden en el departamento de emergencias por dolor abdominal y Journalist, newspaper. Mientras usted est aqu, realizamos varios estudios de laboratorio, realizamos un examen fsico y revisamos su Scientist, product/process development.  Todos estos fueron tranquilizadores y no mostraron ninguna causa de emergencia para sus sntomas de hoy. No vimos signos de un ataque cardaco u otros problemas cardacos en este momento. Todos los rganos del abdomen funcionan correctamente.  Es probable que sus sntomas sean causados por un efecto secundario de la doxiciclina. Por favor no vuelva a tomar este medicamento. Tome la diciclomina de 2 a 4 veces al da segn sea necesario para Chief Technology Officer o los calambres abdominales hasta que su abdomen se sienta mejor. Programe una cita con su mdico de atencin primaria dentro de los prximos 2 a 3 das si sus sntomas no mejoraron para entonces.  Si tiene Herbalist abdominal que empeora, dolor en el pecho, dificultad para respirar, se desmaya, fiebre asociada con su dolor o tiene cualquier otra razn para pensar que necesita atencin de Associate Professor, regrese al departamento de emergencias. Esperamos que te sientas mejor pronto.  _____________________________________  Bonita Quin are seen in the emergency department for abdominal pain and chest pain.  While you are here we performed several laboratory studies, performed a physical exam, and checked your heart on an EKG.  These were all reassuring, showing no emergency cause of your symptoms today.  We saw no signs of a heart attack or other problems with your heart at this time.  All of your belly organs are functioning properly.  Your symptoms are likely caused by a side effect of the doxycycline.  Please do not restart taking this medication.  Please take the dicyclomine 2-4 times per day as needed for abdominal pain or cramping until your abdomen feels better.  Please make an appointment to see your primary doctor within the next 2 to  3 days if your symptoms not improved by then.  If you have worsening abdominal pain, chest pain, difficulty breathing, passing out, fevers associated with your pain, or have any other reason to think you need emergency care, please come back to the emergency department.  We hope you feel better soon.

## 2021-12-09 ENCOUNTER — Other Ambulatory Visit: Payer: Self-pay

## 2021-12-09 ENCOUNTER — Ambulatory Visit: Payer: Self-pay | Attending: Nurse Practitioner | Admitting: Nurse Practitioner

## 2021-12-09 ENCOUNTER — Encounter: Payer: Self-pay | Admitting: Nurse Practitioner

## 2021-12-09 VITALS — BP 112/68 | HR 71 | Temp 98.0°F | Ht 65.0 in | Wt 183.4 lb

## 2021-12-09 DIAGNOSIS — B009 Herpesviral infection, unspecified: Secondary | ICD-10-CM

## 2021-12-09 DIAGNOSIS — R053 Chronic cough: Secondary | ICD-10-CM

## 2021-12-09 MED ORDER — LEVOCETIRIZINE DIHYDROCHLORIDE 5 MG PO TABS
5.0000 mg | ORAL_TABLET | Freq: Every evening | ORAL | 3 refills | Status: DC
  Filled 2021-12-09: qty 30, 30d supply, fill #0

## 2021-12-09 MED ORDER — BENZONATATE 100 MG PO CAPS
100.0000 mg | ORAL_CAPSULE | Freq: Three times a day (TID) | ORAL | 0 refills | Status: DC | PRN
  Filled 2021-12-09: qty 60, 20d supply, fill #0

## 2021-12-09 MED ORDER — FLUTICASONE PROPIONATE 50 MCG/ACT NA SUSP
2.0000 | Freq: Every day | NASAL | 6 refills | Status: DC
  Filled 2021-12-09: qty 16, 30d supply, fill #0

## 2021-12-09 MED ORDER — VALACYCLOVIR HCL 1 G PO TABS
1000.0000 mg | ORAL_TABLET | Freq: Every day | ORAL | 3 refills | Status: DC
  Filled 2021-12-09 – 2021-12-28 (×2): qty 90, 90d supply, fill #0

## 2021-12-09 MED ORDER — PSEUDOEPH-BROMPHEN-DM 30-2-10 MG/5ML PO SYRP
5.0000 mL | ORAL_SOLUTION | Freq: Four times a day (QID) | ORAL | 0 refills | Status: DC | PRN
  Filled 2021-12-09: qty 240, 12d supply, fill #0

## 2021-12-09 NOTE — Progress Notes (Signed)
Assessment & Plan:  [name] was seen today for cough.  Diagnoses and all orders for this visit:  Chronic cough -     benzonatate (TESSALON) 100 MG capsule; Take 1 capsule (100 mg total) by mouth 3 (three) times daily as needed for cough. -     brompheniramine-pseudoephedrine-DM 30-2-10 MG/5ML syrup; Take 5 mLs by mouth 4 (four) times daily as needed. -     levocetirizine (XYZAL ALLERGY 24HR) 5 MG tablet; Take 1 tablet (5 mg total) by mouth every evening. -     fluticasone (FLONASE) 50 MCG/ACT nasal spray; Place 2 sprays into both nostrils daily.  HSV (herpes simplex virus) infection -     valACYclovir (VALTREX) 1000 MG tablet; Take 1 tablet (1,000 mg total) by mouth daily.    Patient has been counseled on age-appropriate routine health concerns for screening and prevention. These are reviewed and up-to-date. Referrals have been placed accordingly. Immunizations are up-to-date or declined.    Subjective:   Chief Complaint  Patient presents with   Cough   [name] 51 y.o. male presents to office today for chronic cough.   VRI was used to communicate directly with patient for the entire encounter including providing detailed patient instructions.     Cough: Patient complains of nonproductive cough.  Symptoms began several months ago.  The cough is non-productive, without wheezing, dyspnea or hemoptysis and is aggravated by nothing Associated symptoms include:postnasal drip. Patient does not have new pets. Patient does have a history of asthma. Patient does have a history of environmental allergens. Patient has not had recent travel. Patient does not have a history of smoking. Patient  had previous Chest X-ray 12-03-2021 which was normal.   He was seen in the ED on 11-14 11-16 and 11-17  for URI with cough, chest pain, abdominal pain and diarrhea. Work up was negative for any infectious disease process (GI or URI) today he states he is still having loose stools although he  reported to the ED staff on 11-19 that his diarrhea has resolved. He has not been taking the dicyclomine. I believe his cough is due to allergies and he has not been taking the antihistamine or nasal spray that I prescribed. Cough is dry and he does endorse post nasal drip.    Review of Systems  Constitutional:  Negative for fever, malaise/fatigue and weight loss.  HENT: Negative.  Negative for nosebleeds.   Eyes: Negative.  Negative for blurred vision, double vision and photophobia.  Respiratory:  Positive for cough. Negative for hemoptysis, sputum production, shortness of breath and wheezing.   Cardiovascular: Negative.  Negative for chest pain, palpitations and leg swelling.  Gastrointestinal:  Positive for abdominal pain and diarrhea. Negative for blood in stool, constipation, heartburn, melena, nausea and vomiting.  Musculoskeletal: Negative.  Negative for myalgias.  Neurological: Negative.  Negative for dizziness, focal weakness, seizures and headaches.  Endo/Heme/Allergies:  Positive for environmental allergies.  Psychiatric/Behavioral: Negative.  Negative for suicidal ideas.     Past Medical History:  Diagnosis Date   Asthma    Herpes    Shingles     Past Surgical History:  Procedure Laterality Date   MOUTH SURGERY      Family History  Problem Relation Age of Onset   Cancer Father     Social History Reviewed with no changes to be made today.   Outpatient Medications Prior to Visit  Medication Sig Dispense Refill   amitriptyline (ELAVIL) 25 MG tablet Take 1  tablet (25 mg total) by mouth at bedtime. Start with 1/2 tablet for one week. If tolerating well, may increase to 1 tablet at bedtime 30 tablet 1   diazepam (VALIUM) 5 MG tablet Take 5 mg by mouth every 6 (six) hours as needed for anxiety.     dicyclomine (BENTYL) 20 MG tablet Take 1 tablet (20 mg total) by mouth 4 (four) times daily as needed (intestinal cramps). 40 tablet 0   hydrocortisone cream 1 % Apply to  affected area 2 times daily 28.4 g 0   hydrOXYzine (ATARAX) 25 MG tablet Take 1 tablet (25 mg total) by mouth 2 (two) times daily as needed for anxiety. 20 tablet 0   methocarbamol (ROBAXIN) 500 MG tablet Take 1 tablet (500 mg total) by mouth 2 (two) times daily. 20 tablet 0   olopatadine (PATADAY) 0.1 % ophthalmic solution Place 1 drop into both eyes 2 (two) times daily. 5 mL 1   polyethylene glycol powder (MIRALAX) 17 GM/SCOOP powder Dissolve 1 capful (17 grams) in 4-8 ounces of water/juice and take by mouth 2 (two) times daily. 238 g 0   benzonatate (TESSALON) 100 MG capsule Take 1 capsule (100 mg total) by mouth 3 (three) times daily as needed for cough. 21 capsule 0   levocetirizine (XYZAL ALLERGY 24HR) 5 MG tablet Take 1 tablet (5 mg total) by mouth every evening. 30 tablet 2   valACYclovir (VALTREX) 1000 MG tablet Take 1 tablet (1,000 mg total) by mouth daily. 30 tablet 0   No facility-administered medications prior to visit.    Allergies  Allergen Reactions   Cyclobenzaprine     Facial Numbness/dizziness/hypersomnolence   Ibuprofen Hives   Methocarbamol     Abdominal pain   Amoxicillin Itching    Pruritic rash   Penicillins Palpitations    Has patient had a PCN reaction causing immediate rash, facial/tongue/throat swelling, SOB or lightheadedness with hypotension: No Has patient had a PCN reaction causing severe rash involving mucus membranes or skin necrosis: No Has patient had a PCN reaction that required hospitalization: No Has patient had a PCN reaction occurring within the last 10 years: No If all of the above answers are "NO", then may proceed with Cephalosporin use.       Objective:    BP 112/68   Pulse 71   Temp 98 F (36.7 C) (Temporal)   Ht 5\' 5"  (1.651 m)   Wt 183 lb 6.4 oz (83.2 kg)   SpO2 98%   BMI 30.52 kg/m  Wt Readings from Last 3 Encounters:  12/09/21 183 lb 6.4 oz (83.2 kg)  12/04/21 176 lb (79.8 kg)  09/14/21 176 lb (79.8 kg)    Physical  Exam Vitals and nursing note reviewed.  Constitutional:      Appearance: He is well-developed.  HENT:     Head: Normocephalic and atraumatic.  Cardiovascular:     Rate and Rhythm: Normal rate and regular rhythm.     Heart sounds: Normal heart sounds. No murmur heard.    No friction rub. No gallop.  Pulmonary:     Effort: Pulmonary effort is normal. No tachypnea or respiratory distress.     Breath sounds: Normal breath sounds. No decreased breath sounds, wheezing, rhonchi or rales.  Chest:     Chest wall: No tenderness.  Abdominal:     General: Bowel sounds are normal.     Palpations: Abdomen is soft.  Musculoskeletal:        General: Normal range of motion.  Cervical back: Normal range of motion.  Skin:    General: Skin is warm and dry.  Neurological:     Mental Status: He is alert and oriented to person, place, and time.     Coordination: Coordination normal.  Psychiatric:        Behavior: Behavior normal. Behavior is cooperative.        Thought Content: Thought content normal.        Judgment: Judgment normal.          Patient has been counseled extensively about nutrition and exercise as well as the importance of adherence with medications and regular follow-up. The patient was given clear instructions to go to ER or return to medical center if symptoms don't improve, worsen or new problems develop. The patient verbalized understanding.   Follow-up: Return if symptoms worsen or fail to improve.   Gildardo Pounds, FNP-BC [facility] [place], [place]   12/09/2021, 7:35 PM

## 2021-12-28 ENCOUNTER — Other Ambulatory Visit: Payer: Self-pay

## 2022-01-03 ENCOUNTER — Telehealth (HOSPITAL_COMMUNITY): Payer: Self-pay

## 2022-01-03 ENCOUNTER — Ambulatory Visit (HOSPITAL_COMMUNITY)
Admission: EM | Admit: 2022-01-03 | Discharge: 2022-01-03 | Disposition: A | Discharge status: Home / Self Care | Payer: Self-pay | Attending: Emergency Medicine | Admitting: Emergency Medicine

## 2022-01-03 ENCOUNTER — Encounter (HOSPITAL_COMMUNITY): Payer: Self-pay | Admitting: *Deleted

## 2022-01-03 DIAGNOSIS — B309 Viral conjunctivitis, unspecified: Secondary | ICD-10-CM

## 2022-01-03 DIAGNOSIS — L299 Pruritus, unspecified: Secondary | ICD-10-CM

## 2022-01-03 MED ORDER — OLOPATADINE HCL 0.1 % OP SOLN
1.0000 [drp] | Freq: Two times a day (BID) | OPHTHALMIC | 3 refills | Status: DC
  Filled 2022-01-03: qty 5, 50d supply, fill #0

## 2022-01-03 MED ORDER — OLOPATADINE HCL 0.1 % OP SOLN: 1.0000 [drp] | Freq: Two times a day (BID) | OPHTHALMIC | 3 refills | Status: DC

## 2022-01-03 MED ORDER — FAMOTIDINE 20 MG PO TABS: 20.0000 mg | ORAL_TABLET | Freq: Every evening | ORAL | 3 refills | Status: DC

## 2022-01-03 MED ORDER — CETIRIZINE HCL 10 MG PO TABS
10.0000 mg | ORAL_TABLET | Freq: Every day | ORAL | 3 refills | Status: DC
  Filled 2022-01-03: qty 30, 30d supply, fill #0

## 2022-01-03 MED ORDER — CETIRIZINE HCL 10 MG PO TABS: 10.0000 mg | ORAL_TABLET | Freq: Every day | ORAL | 3 refills | Status: DC

## 2022-01-03 MED ORDER — FAMOTIDINE 20 MG PO TABS
20.0000 mg | ORAL_TABLET | Freq: Every evening | ORAL | 3 refills | Status: DC
  Filled 2022-01-03: qty 30, 30d supply, fill #0

## 2022-01-03 NOTE — ED Triage Notes (Signed)
Pt states through professional translator service that he has been waking up with burning eyes and eye drainage X 5 days.  Pt has used no OTC meds for sx.

## 2022-01-03 NOTE — ED Provider Notes (Signed)
[facility]    CSN: [id number] Arrival date & time: 01/03/22  1142     History   Chief Complaint Chief Complaint  Patient presents with   Eye Problem    [name] is a 51 y.o. male.  Medical interpreter used for this encounter Presents with 5-day history of bilateral eye burning and itching He denies any mucus discharge or drainage, no crusting.  Only watery drainage  Additionally reports some dry nose and a little cough. Early this morning he had some itching on the face that he applied antibiotic ointment to  No trouble breathing or problems swallowing No fevers No known sick contacts  Past Medical History:  Diagnosis Date   Asthma    Herpes    Shingles     Patient Active Problem List   Diagnosis Date Noted   Acute bilateral low back pain without sciatica 06/05/2020   Acute pain of right shoulder 06/05/2020   Asthma due to environmental allergies 12/20/2016   HSV (herpes simplex virus) infection 02/24/2015   Anxiety 04/02/2014    Past Surgical History:  Procedure Laterality Date   MOUTH SURGERY         Home Medications    Prior to Admission medications   Medication Sig Start Date End Date Taking? Authorizing Provider  cetirizine (ZYRTEC ALLERGY) 10 MG tablet Take 1 tablet (10 mg total) by mouth daily. Neomia Dear vez al dia 01/03/22  Yes Sou Nohr, Lurena Joiner, PA-C  famotidine (PEPCID) 20 MG tablet Take 1 tablet (20 mg total) by mouth at bedtime. Una vez por la noche 01/03/22  Yes Willa Brocks, PA-C  olopatadine (PATANOL) 0.1 % ophthalmic solution Place 1 drop into both eyes 2 (two) times daily. Dos veces al dia, los dos ojos 01/03/22  Yes [place], Lurena Joiner, PA-C  amitriptyline (ELAVIL) 25 MG tablet Take 1 tablet (25 mg total) by mouth at bedtime. Start with 1/2 tablet for one week. If tolerating well, may increase to 1 tablet at bedtime 09/14/21 12/09/21  Madelyn Brunner, DO  benzonatate (TESSALON) 100 MG capsule Take 1 capsule (100 mg total) by  mouth 3 (three) times daily as needed for cough. 12/09/21   Claiborne Rigg, NP  brompheniramine-pseudoephedrine-DM 30-2-10 MG/5ML syrup Take 5 mLs by mouth 4 (four) times daily as needed. 12/09/21   Claiborne Rigg, NP  diazepam (VALIUM) 5 MG tablet Take 5 mg by mouth every 6 (six) hours as needed for anxiety.    [provider]  dicyclomine (BENTYL) 20 MG tablet Take 1 tablet (20 mg total) by mouth 4 (four) times daily as needed (intestinal cramps). 12/03/21   Zenia Resides, MD  fluticasone (FLONASE) 50 MCG/ACT nasal spray Place 2 sprays into both nostrils daily. 12/09/21   Claiborne Rigg, NP  hydrocortisone cream 1 % Apply to affected area 2 times daily 12/01/21   Debby Freiberg, NP  hydrOXYzine (ATARAX) 25 MG tablet Take 1 tablet (25 mg total) by mouth 2 (two) times daily as needed for anxiety. 10/02/21   Bing Neighbors, FNP  methocarbamol (ROBAXIN) 500 MG tablet Take 1 tablet (500 mg total) by mouth 2 (two) times daily. 10/19/21   Jeannie Fend, PA-C  polyethylene glycol powder (MIRALAX) 17 GM/SCOOP powder Dissolve 1 capful (17 grams) in 4-8 ounces of water/juice and take by mouth 2 (two) times daily. 09/11/21   Hoy Register, MD  valACYclovir (VALTREX) 1000 MG tablet Take 1 tablet (1,000 mg total) by mouth daily. 12/09/21   Meredeth Ide,  Shea Stakes, NP  SUMAtriptan (IMITREX) 50 MG tablet Take one for headache.  May repeat in 2 hours.  No more than 2 a day 05/27/17 12/09/21  Eustace Moore, MD    Family History Family History  Problem Relation Age of Onset   Cancer Father     Social History Social History   Tobacco Use   Smoking status: Former   Smokeless tobacco: Never  Building services engineer Use: Never used  Substance Use Topics   Alcohol use: No   Drug use: No     Allergies   Cyclobenzaprine, Ibuprofen, Methocarbamol, Amoxicillin, and Penicillins   Review of Systems Review of Systems Per HPI  Physical Exam Triage Vital Signs ED Triage Vitals   Enc Vitals Group     BP 01/03/22 1344 116/66     Pulse Rate 01/03/22 1344 76     Resp 01/03/22 1344 18     Temp 01/03/22 1344 98.6 F (37 C)     Temp Source 01/03/22 1344 Oral     SpO2 01/03/22 1344 96 %     Weight --      Height --      Head Circumference --      Peak Flow --      Pain Score 01/03/22 1339 0     Pain Loc --      Pain Edu? --      Excl. in GC? --    No data found.  Updated Vital Signs BP 116/66 (BP Location: Left Arm)   Pulse 76   Temp 98.6 F (37 C) (Oral)   Resp 18   SpO2 96%    Physical Exam Vitals and nursing note reviewed.  Constitutional:      General: He is not in acute distress. HENT:     Nose: No congestion or rhinorrhea.     Mouth/Throat:     Mouth: Mucous membranes are moist.     Pharynx: Oropharynx is clear. No posterior oropharyngeal erythema.  Eyes:     General: Lids are normal. Vision grossly intact.     Pupils: Pupils are equal, round, and reactive to light.     Comments: Eyes are watery.  Very mildly injected.  No discharge  Cardiovascular:     Rate and Rhythm: Normal rate and regular rhythm.     Pulses: Normal pulses.     Heart sounds: Normal heart sounds.  Pulmonary:     Effort: Pulmonary effort is normal.     Breath sounds: Normal breath sounds.  Skin:    General: Skin is warm and dry.     Findings: No rash.  Neurological:     Mental Status: He is alert and oriented to person, place, and time.     UC Treatments / Results  Labs (all labs ordered are listed, but only abnormal results are displayed) Labs Reviewed - No data to display  EKG  Radiology No results found.  Procedures Procedures   Medications Ordered in UC Medications - No data to display  Initial Impression / Assessment and Plan / UC Course  I have reviewed the triage vital signs and the nursing notes.  Pertinent labs & imaging results that were available during my care of the patient were reviewed by me and considered in my medical decision  making (see chart for details).  Viral conjunctivitis Allergy eye drops twice daily Daily Zyrtec.  Will add nightly Pepcid given the itching on his face.  Discussed combination of  these medicine should cover all his symptoms. No indication for the antibiotic ointment he was using, he will discontinue.  No red flags today. He does have a primary care he can follow-up with as needed. Return precautions discussed. Patient agrees to plan  Final Clinical Impressions(s) / UC Diagnoses   Final diagnoses:  Viral conjunctivitis  Itching     Discharge Instructions      tome zyrtec una vez cada maana y pepcid una vez cada noche. esto ayudar con la picazn. use las gotas para los [employer] al da (maana y noche) durante los prximos 7 [place]. esto debera tratar el ardor y la picazn en los ojos.    ED Prescriptions     Medication Sig Dispense Auth. Provider   cetirizine (ZYRTEC ALLERGY) 10 MG tablet Take 1 tablet (10 mg total) by mouth daily. Una vez al dia 30 tablet [employer] Dem[employer]co, Lurena Joiner, PA-C   famotidine (PEPCID) 20 MG tablet Take 1 tablet (20 mg total) by mouth at bedtime. Una vez por la noche 30 tablet Dania Marsan, PA-C   olopatadine (PATANOL) 0.1 % ophthalmic solution Place 1 drop into both eyes 2 (two) times daily. Dos veces al dia, los dos ojos 5 mL Maxum Cassarino, [place], [place]      PDMP not reviewed this encounter.   Marlow Baars, [place] 01/03/22 1546

## 2022-01-03 NOTE — Discharge Instructions (Addendum)
tome zyrtec una vez cada maana y pepcid una vez cada noche. esto ayudar con la picazn. use las gotas para los [employer] al da (maana y noche) durante los prximos 7 [place]. esto debera tratar el ardor y la picazn en los ojos.

## 2022-01-04 ENCOUNTER — Other Ambulatory Visit: Payer: Self-pay

## 2022-01-17 ENCOUNTER — Encounter (HOSPITAL_COMMUNITY): Payer: Self-pay | Admitting: *Deleted

## 2022-01-17 ENCOUNTER — Ambulatory Visit (HOSPITAL_COMMUNITY)
Admission: EM | Admit: 2022-01-17 | Discharge: 2022-01-17 | Disposition: A | Discharge status: Home / Self Care | Payer: Self-pay | Attending: Family Medicine | Admitting: Family Medicine

## 2022-01-17 DIAGNOSIS — R0789 Other chest pain: Secondary | ICD-10-CM

## 2022-01-17 MED ORDER — PREDNISONE 20 MG PO TABS
40.0000 mg | ORAL_TABLET | Freq: Every day | ORAL | 0 refills | Status: AC
  Filled 2022-01-22: qty 10, 5d supply, fill #0

## 2022-01-17 MED ORDER — TIZANIDINE HCL 4 MG PO TABS
4.0000 mg | ORAL_TABLET | Freq: Three times a day (TID) | ORAL | 0 refills | Status: DC | PRN
  Filled 2022-01-22: qty 15, 5d supply, fill #0

## 2022-01-17 NOTE — Discharge Instructions (Signed)
Take prednisone 20 mg--2 daily for 5 days (Tome 2 tabletas por la boca diaramente por 5 dias)    Take tizanidine 4 mg--1 every 8 hours as needed for muscle spasms; this medication can cause dizziness and sleepiness  (1 cada 8 horas si tiene dolor de los musculos)  Follow-up with your primary care about all these issues (por favor haga cita con su doctora general sobre [employer])

## 2022-01-17 NOTE — ED Provider Notes (Signed)
[facility]    CSN: [id number] Arrival date & time: 01/17/22  1755      History   Chief Complaint Chief Complaint  Patient presents with   Headache   Muscle Pain    [name] is a 51 y.o. male.    Headache Muscle Pain Associated symptoms include headaches.   Here for sharp pleuritic pain in his left lower anterior chest that began today.  No injury.  No fever or cough.  He also notes a little headache to the nurse but not to me.  No cough or congestion.    Past Medical History:  Diagnosis Date   Asthma    Herpes    Shingles     Patient Active Problem List   Diagnosis Date Noted   Acute bilateral low back pain without sciatica 06/05/2020   Acute pain of right shoulder 06/05/2020   Asthma due to environmental allergies 12/20/2016   HSV (herpes simplex virus) infection 02/24/2015   Anxiety 04/02/2014    Past Surgical History:  Procedure Laterality Date   MOUTH SURGERY         Home Medications    Prior to Admission medications   Medication Sig Start Date End Date Taking? Authorizing Provider  predniSONE (DELTASONE) 20 MG tablet Take 2 tablets (40 mg total) by mouth daily with breakfast for 5 days. 01/17/22 01/22/22 Yes Kili Gracy, Janace Aris, MD  tiZANidine (ZANAFLEX) 4 MG tablet Take 1 tablet (4 mg total) by mouth every 8 (eight) hours as needed for muscle spasms. 01/17/22  Yes Zenia Resides, MD  amitriptyline (ELAVIL) 25 MG tablet Take 1 tablet (25 mg total) by mouth at bedtime. Start with 1/2 tablet for one week. If tolerating well, may increase to 1 tablet at bedtime 09/14/21 12/09/21  Madelyn Brunner, DO  cetirizine (ZYRTEC ALLERGY) 10 MG tablet Take 1 tablet (10 mg total) by mouth daily. Neomia Dear vez al dia 01/03/22   Rising, Rebecca, PA-C  diazepam (VALIUM) 5 MG tablet Take 5 mg by mouth every 6 (six) hours as needed for anxiety.    [provider]  dicyclomine (BENTYL) 20 MG tablet Take 1 tablet (20 mg total) by mouth 4 (four)  times daily as needed (intestinal cramps). 12/03/21   Zenia Resides, MD  famotidine (PEPCID) 20 MG tablet Take 1 tablet (20 mg total) by mouth at bedtime. Una vez por la noche 01/03/22   Rising, Rebecca, PA-C  fluticasone (FLONASE) 50 MCG/ACT nasal spray Place 2 sprays into both nostrils daily. 12/09/21   Claiborne Rigg, NP  hydrocortisone cream 1 % Apply to affected area 2 times daily 12/01/21   Debby Freiberg, NP  hydrOXYzine (ATARAX) 25 MG tablet Take 1 tablet (25 mg total) by mouth 2 (two) times daily as needed for anxiety. 10/02/21   Bing Neighbors, FNP  methocarbamol (ROBAXIN) 500 MG tablet Take 1 tablet (500 mg total) by mouth 2 (two) times daily. 10/19/21   Jeannie Fend, PA-C  olopatadine (PATANOL) 0.1 % ophthalmic solution Place 1 drop into both eyes 2 (two) times daily. Dos veces al dia, los dos ojos 01/03/22   Rising, Lurena Joiner, PA-C  polyethylene glycol powder (MIRALAX) 17 GM/SCOOP powder Dissolve 1 capful (17 grams) in 4-8 ounces of water/juice and take by mouth 2 (two) times daily. 09/11/21   Hoy Register, MD  valACYclovir (VALTREX) 1000 MG tablet Take 1 tablet (1,000 mg total) by mouth daily. 12/09/21   Claiborne Rigg, NP  SUMAtriptan (  IMITREX) 50 MG tablet Take one for headache.  May repeat in 2 hours.  No more than 2 a day 05/27/17 12/09/21  Eustace Moore, MD    Family History Family History  Problem Relation Age of Onset   Cancer Father     Social History Social History   Tobacco Use   Smoking status: Former   Smokeless tobacco: Never  Building services engineer Use: Never used  Substance Use Topics   Alcohol use: No   Drug use: No     Allergies   Cyclobenzaprine, Ibuprofen, Methocarbamol, Amoxicillin, and Penicillins   Review of Systems Review of Systems  Neurological:  Positive for headaches.     Physical Exam Triage Vital Signs ED Triage Vitals  Enc Vitals Group     BP 01/17/22 2006 (!) 115/57     Pulse Rate 01/17/22 2006 96      Resp 01/17/22 2006 18     Temp 01/17/22 2006 98.8 F (37.1 C)     Temp src --      SpO2 01/17/22 2006 100 %     Weight --      Height --      Head Circumference --      Peak Flow --      Pain Score 01/17/22 2005 0     Pain Loc --      Pain Edu? --      Excl. in GC? --    No data found.  Updated Vital Signs BP (!) 115/57   Pulse 96   Temp 98.8 F (37.1 C)   Resp 18   SpO2 100%   Visual Acuity Right Eye Distance:   Left Eye Distance:   Bilateral Distance:    Right Eye Near:   Left Eye Near:    Bilateral Near:     Physical Exam Vitals reviewed.  Constitutional:      General: He is not in acute distress.    Appearance: He is not ill-appearing, toxic-appearing or diaphoretic.     Comments: Patient is clinically well-appearing in the room in no distress.  HENT:     Nose: Nose normal.     Mouth/Throat:     Mouth: Mucous membranes are moist.  Eyes:     Extraocular Movements: Extraocular movements intact.     Pupils: Pupils are equal, round, and reactive to light.  Cardiovascular:     Rate and Rhythm: Normal rate and regular rhythm.     Heart sounds: No murmur heard. Pulmonary:     Effort: Pulmonary effort is normal. No respiratory distress.     Breath sounds: Normal breath sounds. No wheezing, rhonchi or rales.  Chest:     Chest wall: Tenderness (He is tender in the left lower chest in the mid close to lower lung and.  This is where he states the pain has been.  There is no rash in that area.) present.  Musculoskeletal:     Cervical back: Neck supple.  Lymphadenopathy:     Cervical: No cervical adenopathy.  Skin:    Coloration: Skin is not jaundiced or pale.  Neurological:     General: No focal deficit present.     Mental Status: He is alert and oriented to person, place, and time.      UC Treatments / Results  Labs (all labs ordered are listed, but only abnormal results are displayed) Labs Reviewed - No data to display  EKG   Radiology No results  found.  Procedures Procedures (including critical care time)  Medications Ordered in UC Medications - No data to display  Initial Impression / Assessment and Plan / UC Course  I have reviewed the triage vital signs and the nursing notes.  Pertinent labs & imaging results that were available during my care of the patient were reviewed by me and considered in my medical decision making (see chart for details).     At the end of the visit the patient wanted to talk about other symptoms such as having trouble when he feels the need to go to the bathroom and such that has been more chronic.  I have asked him to please follow-up with his primary care about these more chronic issues  Medication prescriptions were printed at the patient's request Final Clinical Impressions(s) / UC Diagnoses   Final diagnoses:  Chest wall pain     Discharge Instructions      Take prednisone 20 mg--2 daily for 5 days (Tome 2 tabletas por la boca diaramente por 5 dias)    Take tizanidine 4 mg--1 every 8 hours as needed for muscle spasms; this medication can cause dizziness and sleepiness  (1 cada 8 horas si tiene dolor de los musculos)  Follow-up with your primary care about all these issues (por favor haga cita con su doctora general sobre [employer])     ED Prescriptions     Medication Sig Dispense Auth. Provider   predniSONE (DELTASONE) 20 MG tablet Take 2 tablets (40 mg total) by mouth daily with breakfast for 5 days. 10 tablet Zenia Resides, MD   tiZANidine (ZANAFLEX) 4 MG tablet Take 1 tablet (4 mg total) by mouth every 8 (eight) hours as needed for muscle spasms. 15 tablet [place] Strieter, Janace Aris, MD      PDMP not reviewed this encounter.   Zenia Resides, MD 01/17/22 2037

## 2022-01-17 NOTE — ED Triage Notes (Signed)
Pt reports no ear pain . Pt has a HA and muscle pain when breathing deeply.

## 2022-01-18 ENCOUNTER — Encounter (HOSPITAL_COMMUNITY): Payer: Self-pay

## 2022-01-18 ENCOUNTER — Other Ambulatory Visit: Payer: Self-pay

## 2022-01-18 ENCOUNTER — Emergency Department (HOSPITAL_COMMUNITY): Payer: Self-pay

## 2022-01-18 ENCOUNTER — Emergency Department (HOSPITAL_COMMUNITY)
Admission: EM | Admit: 2022-01-18 | Discharge: 2022-01-18 | Disposition: A | Discharge status: Home / Self Care | Payer: Self-pay | Attending: Emergency Medicine | Admitting: Emergency Medicine

## 2022-01-18 DIAGNOSIS — R0789 Other chest pain: Secondary | ICD-10-CM | POA: Insufficient documentation

## 2022-01-18 LAB — CBC
HCT: 48.5 % (ref 39.0–52.0)
Hemoglobin: 16.9 g/dL (ref 13.0–17.0)
MCH: 30.2 pg (ref 26.0–34.0)
MCHC: 34.8 g/dL (ref 30.0–36.0)
MCV: 86.8 fL (ref 80.0–100.0)
Platelets: 228 10*3/uL (ref 150–400)
RBC: 5.59 MIL/uL (ref 4.22–5.81)
RDW: 13 % (ref 11.5–15.5)
WBC: 6.3 10*3/uL (ref 4.0–10.5)
nRBC: 0 % (ref 0.0–0.2)

## 2022-01-18 LAB — BASIC METABOLIC PANEL
Anion gap: 7 (ref 5–15)
BUN: 8 mg/dL (ref 6–20)
CO2: 26 mmol/L (ref 22–32)
Calcium: 9 mg/dL (ref 8.9–10.3)
Chloride: 105 mmol/L (ref 98–111)
Creatinine, Ser: 0.71 mg/dL (ref 0.61–1.24)
GFR, Estimated: >60 mL/min (ref >60)
Glucose, Bld: 103 mg/dL — ABNORMAL HIGH (ref 70–99)
Potassium: 3.4 mmol/L — ABNORMAL LOW (ref 3.5–5.1)
Sodium: 138 mmol/L (ref 135–145)

## 2022-01-18 LAB — TROPONIN I (HIGH SENSITIVITY): Troponin I (High Sensitivity): 2 ng/L (ref <18)

## 2022-01-18 MED ORDER — FAMOTIDINE 20 MG PO TABS: 20.0000 mg | ORAL_TABLET | Freq: Two times a day (BID) | ORAL | 3 refills | Status: DC

## 2022-01-18 NOTE — ED Provider Triage Note (Signed)
Emergency Medicine Provider Triage Evaluation Note  Gabriel Ball , a 52 y.o. male  was evaluated in triage.  Pt complains of chest pain since last night.  Burning sensation, associated shortness of breath.  To the left side.  Worse after eating or drinking.  Endorses nausea, no vomiting.  Review of Systems  Per HPI  Physical Exam  BP 119/70 (BP Location: Left Arm)   Pulse 100   Temp 98.5 F (36.9 C) (Oral)   Resp (!) 27   SpO2 99%  Gen:   Awake, no distress   Resp:  Normal effort  MSK:   Moves extremities without difficulty  Other:    Medical Decision Making  Medically screening exam initiated at 9:39 AM.  Appropriate orders placed.  Gabriel Ball was informed that the remainder of the evaluation will be completed by another provider, this initial triage assessment does not replace that evaluation, and the importance of remaining in the ED until their evaluation is complete.     Gabriel Raring, PA-C 01/18/22 [id number]

## 2022-01-18 NOTE — Discharge Instructions (Addendum)
Lo atendieron por su dolor en el pecho en el departamento de emergencias.  En casa, tome su Pepcid en caso de que el malestar en el pecho se deba al reflujo cido.  Haga un seguimiento con su mdico de atencin primaria en 2-3 das con respecto a su visita. Si necesita un mdico de atencin primaria, puede realizar un seguimiento en nuestra oficina de atencin primaria en [facility].  Regrese inmediatamente al departamento de emergencias si experimenta alguno de los siguientes sntomas: empeoramiento del dolor en el pecho, dificultad para respirar o cualquier otro sntoma preocupante.  Gracias por visitar nuestro Departamento de Emergencias. Fue un placer atenderte hoy.  ----  You were seen for your chest pain in the emergency department.   At home, please take your Pepcid in case your chest discomfort is from acid reflux.    Follow-up with your primary doctor in 2-3 days regarding your visit.  If you need a primary care doctor you can follow-up with our primary care office at [place].  Return immediately to the emergency department if you experience any of the following: Worsening chest pain, difficulty breathing, or any other concerning symptoms.    Thank you for visiting our Emergency Department. It was a pleasure taking care of you today.

## 2022-01-18 NOTE — ED Triage Notes (Signed)
Pt c/o chest discomfort since yesterday. Pt states he has pain/discomfort like this sometimes when he eats, especially if its a heavy meal. Pt denies SOB, denies any cardiac history.

## 2022-01-18 NOTE — ED Provider Notes (Signed)
[facility]-EMERGENCY DEPT Provider Note   CSN: [id number] Arrival date & time: 01/18/22  0847     History  No chief complaint on file.   Gabriel Ball is a 52 y.o. male.  52 yo M who presents with chest and abdominal discomfort.  Started feeling like stomach was bloated after cooking with coconut. Sometimes this happens when he eats a heavy meal.  Says that he felt the urge to go to the bathroom immediately afterwards but was unable to have a bowel movement.  Did have a bowel movement in the emergency department several hours ago.  Went to urgent care and had a reassuring evaluation.  And was instructed to follow-up with his primary doctor.  Also felt a stabbing sensation in his heart. On the L side. Not exertional.  Not pleuritic.  No shortness of breath or cough.  No vomiting.  No diaphoresis.  No history of DVT, MI, immediate family members with a history of MI.  Does not smoke, drink, or use recreational drugs.  History obtained via Spanish interpreter.      Home Medications Prior to Admission medications   Medication Sig Start Date End Date Taking? Authorizing Provider  amitriptyline (ELAVIL) 25 MG tablet Take 1 tablet (25 mg total) by mouth at bedtime. Start with 1/2 tablet for one week. If tolerating well, may increase to 1 tablet at bedtime 09/14/21 12/09/21  Madelyn Brunner, DO  cetirizine (ZYRTEC ALLERGY) 10 MG tablet Take 1 tablet (10 mg total) by mouth daily. Neomia Dear vez al dia 01/03/22   Rising, Rebecca, PA-C  diazepam (VALIUM) 5 MG tablet Take 5 mg by mouth every 6 (six) hours as needed for anxiety.    [provider]  dicyclomine (BENTYL) 20 MG tablet Take 1 tablet (20 mg total) by mouth 4 (four) times daily as needed (intestinal cramps). 12/03/21   Zenia Resides, MD  famotidine (PEPCID) 20 MG tablet Take 1 tablet (20 mg total) by mouth 2 (two) times daily. Una vez por la noche 01/18/22   Rondel Baton, MD  fluticasone [facility]) 50  MCG/ACT nasal spray Place 2 sprays into both nostrils daily. 12/09/21   Claiborne Rigg, NP  hydrocortisone cream 1 % Apply to affected area 2 times daily 12/01/21   Debby Freiberg, NP  hydrOXYzine (ATARAX) 25 MG tablet Take 1 tablet (25 mg total) by mouth 2 (two) times daily as needed for anxiety. 10/02/21   Bing Neighbors, FNP  methocarbamol (ROBAXIN) 500 MG tablet Take 1 tablet (500 mg total) by mouth 2 (two) times daily. 10/19/21   Jeannie Fend, PA-C  olopatadine (PATANOL) 0.1 % ophthalmic solution Place 1 drop into both eyes 2 (two) times daily. Dos veces al dia, los dos ojos 01/03/22   Rising, Lurena Joiner, PA-C  polyethylene glycol powder (MIRALAX) 17 GM/SCOOP powder Dissolve 1 capful (17 grams) in 4-8 ounces of water/juice and take by mouth 2 (two) times daily. 09/11/21   Hoy Register, MD  predniSONE (DELTASONE) 20 MG tablet Take 2 tablets (40 mg total) by mouth daily with breakfast for 5 days. 01/17/22 01/22/22  Zenia Resides, MD  tiZANidine (ZANAFLEX) 4 MG tablet Take 1 tablet (4 mg total) by mouth every 8 (eight) hours as needed for muscle spasms. 01/17/22   Zenia Resides, MD  valACYclovir (VALTREX) 1000 MG tablet Take 1 tablet (1,000 mg total) by mouth daily. 12/09/21   Claiborne Rigg, NP  SUMAtriptan (IMITREX) 50 MG tablet Take one  for headache.  May repeat in 2 hours.  No more than 2 a day 05/27/17 12/09/21  Raylene Everts, MD      Allergies    Cyclobenzaprine, Ibuprofen, Methocarbamol, Amoxicillin, and Penicillins    Review of Systems   Review of Systems  Physical Exam Updated Vital Signs BP 118/72   Pulse 72   Temp 98.4 F (36.9 C) (Oral)   Resp 13   SpO2 99%  Physical Exam Vitals and nursing note reviewed.  Constitutional:      General: He is not in acute distress.    Appearance: He is well-developed.  HENT:     Head: Normocephalic and atraumatic.     Right Ear: External ear normal.     Left Ear: External ear normal.     Nose: Nose normal.   Eyes:     Extraocular Movements: Extraocular movements intact.     Conjunctiva/sclera: Conjunctivae normal.     Pupils: Pupils are equal, round, and reactive to light.  Cardiovascular:     Rate and Rhythm: Normal rate and regular rhythm.     Heart sounds: Normal heart sounds.  Pulmonary:     Effort: Pulmonary effort is normal. No respiratory distress.     Breath sounds: Normal breath sounds.  Abdominal:     General: There is no distension.     Palpations: Abdomen is soft. There is no mass.     Tenderness: There is no abdominal tenderness. There is no guarding.  Musculoskeletal:     Cervical back: Normal range of motion and neck supple.     Right lower leg: No edema.     Left lower leg: No edema.  Skin:    General: Skin is warm and dry.  Neurological:     Mental Status: He is alert. Mental status is at baseline.  Psychiatric:        Mood and Affect: Mood normal.        Behavior: Behavior normal.     ED Results / Procedures / Treatments   Labs (all labs ordered are listed, but only abnormal results are displayed) Labs Reviewed  BASIC METABOLIC PANEL - Abnormal; Notable for the following components:      Result Value   Potassium 3.4 (*)    Glucose, Bld 103 (*)    All other components within normal limits  CBC  TROPONIN I (HIGH SENSITIVITY)    EKG EKG Interpretation  Date/Time:  Monday January 18 2022 09:24:01 EST Ventricular Rate:  99 PR Interval:  147 QRS Duration: 101 QT Interval:  342 QTC Calculation: 439 R Axis:   58 Text Interpretation: Sinus rhythm Confirmed by Margaretmary Eddy [phone]) on 01/18/2022 1:04:59 PM  Radiology DG Chest 2 View  Result Date: 01/18/2022 CLINICAL DATA:  Chest pain EXAM: CHEST - 2 VIEW COMPARISON:  12/03/2021 and prior studies FINDINGS: Telemetry leads overlie the chest. The cardiomediastinal silhouette is unremarkable. There is no evidence of focal airspace disease, pulmonary edema, suspicious pulmonary nodule/mass, pleural effusion,  or pneumothorax. No acute bony abnormalities are identified. IMPRESSION: No active cardiopulmonary disease. Electronically Signed   By: Margarette Canada M.D.   On: 01/18/2022 09:58    Procedures Procedures   Medications Ordered in ED Medications - No data to display  ED Course/ Medical Decision Making/ A&P                           Medical Decision Making  Gabriel Ball is  a 52 y.o. male who presents emergency department with chest and abdominal pain.  Initial Ddx:  Reflux, indigestion, MI, PE, cholecystitis/cholelithiasis  MDM:  The patient's symptoms are not classic with MI.  Will assess with EKG and single troponin but feel if it is normal given the duration of his pain repeat troponin not indicated.  Suspect symptoms are likely due to reflux is very appear to be correlated with eating.  Considered PE as well that patient without significant risk factors and has not had any shortness of breath or cough.  Also considered no cholelithiasis or cholecystitis but has very reassuring abdominal exam at this time.  Plan:  Labs Troponin EKG Chest x-ray  ED Summary/Re-evaluation:  Patient reassessed in the emergency department was doing well.  No recurrence of his chest pain, abdominal pain or any other concerning symptoms.  Heart score of 1 for his age.  EKG and troponin without signs of ischemia.  Feel that he is suitable for outpatient follow-up for his chest discomfort.  Chest x-ray without pneumonia or other abnormalities.  Patient given prescription for Pepcid and instructed to follow-up with his primary doctor in 2 to 3 days regarding his symptoms.  This patient presents to the ED for concern of complaints listed in HPI, this involves an extensive number of treatment options, and is a complaint that carries with it a high risk of complications and morbidity. Disposition including potential need for admission considered.   Dispo: DC Home. Return precautions discussed including, but  not limited to, those listed in the AVS. Allowed pt time to ask questions which were answered fully prior to dc.  Records reviewed Outpatient Clinic Notes The following labs were independently interpreted: Chemistry and show no acute abnormality I independently reviewed the following imaging with scope of interpretation limited to determining acute life threatening conditions related to emergency care: Chest x-ray and agree with the radiologist interpretation with the following exceptions: none I personally reviewed and interpreted cardiac monitoring: normal sinus rhythm  I personally reviewed and interpreted the pt's EKG: see above for interpretation  I have reviewed the patients home medications and made adjustments as needed Social Determinants of health:  Spanish-speaking only  Final Clinical Impression(s) / ED Diagnoses Final diagnoses:  Chest discomfort    Rx / DC Orders ED Discharge Orders          Ordered    famotidine (PEPCID) 20 MG tablet  2 times daily        01/18/22 1353              Fransico Meadow, MD 01/18/22 1401

## 2022-01-22 ENCOUNTER — Other Ambulatory Visit: Payer: Self-pay

## 2022-01-23 ENCOUNTER — Emergency Department (HOSPITAL_COMMUNITY)
Admission: EM | Admit: 2022-01-23 | Discharge: 2022-01-23 | Disposition: A | Discharge status: Home / Self Care | Payer: Self-pay | Attending: Emergency Medicine | Admitting: Emergency Medicine

## 2022-01-23 ENCOUNTER — Emergency Department (HOSPITAL_COMMUNITY): Payer: Self-pay

## 2022-01-23 ENCOUNTER — Encounter (HOSPITAL_COMMUNITY): Payer: Self-pay

## 2022-01-23 DIAGNOSIS — R140 Abdominal distension (gaseous): Secondary | ICD-10-CM | POA: Insufficient documentation

## 2022-01-23 DIAGNOSIS — N433 Hydrocele, unspecified: Secondary | ICD-10-CM | POA: Insufficient documentation

## 2022-01-23 DIAGNOSIS — Z7952 Long term (current) use of systemic steroids: Secondary | ICD-10-CM | POA: Insufficient documentation

## 2022-01-23 DIAGNOSIS — J45909 Unspecified asthma, uncomplicated: Secondary | ICD-10-CM | POA: Insufficient documentation

## 2022-01-23 DIAGNOSIS — Z7951 Long term (current) use of inhaled steroids: Secondary | ICD-10-CM | POA: Insufficient documentation

## 2022-01-23 LAB — COMPREHENSIVE METABOLIC PANEL
ALT: 20 U/L (ref 0–44)
AST: 20 U/L (ref 15–41)
Albumin: 4.6 g/dL (ref 3.5–5.0)
Alkaline Phosphatase: 70 U/L (ref 38–126)
Anion gap: 7 (ref 5–15)
BUN: 11 mg/dL (ref 6–20)
CO2: 24 mmol/L (ref 22–32)
Calcium: 8.9 mg/dL (ref 8.9–10.3)
Chloride: 107 mmol/L (ref 98–111)
Creatinine, Ser: 0.89 mg/dL (ref 0.61–1.24)
GFR, Estimated: >60 mL/min (ref >60)
Glucose, Bld: 105 mg/dL — ABNORMAL HIGH (ref 70–99)
Potassium: 3.8 mmol/L (ref 3.5–5.1)
Sodium: 138 mmol/L (ref 135–145)
Total Bilirubin: 0.9 mg/dL (ref 0.3–1.2)
Total Protein: 7.3 g/dL (ref 6.5–8.1)

## 2022-01-23 LAB — URINALYSIS, ROUTINE W REFLEX MICROSCOPIC
Bilirubin Urine: NEGATIVE
Glucose, UA: NEGATIVE mg/dL
Hgb urine dipstick: NEGATIVE
Ketones, ur: 20 mg/dL — AB
Leukocytes,Ua: NEGATIVE
Nitrite: NEGATIVE
Protein, ur: NEGATIVE mg/dL
Specific Gravity, Urine: 1.023 (ref 1.005–1.030)
pH: 6 (ref 5.0–8.0)

## 2022-01-23 LAB — CBC
HCT: 47.5 % (ref 39.0–52.0)
Hemoglobin: 16.2 g/dL (ref 13.0–17.0)
MCH: 29.3 pg (ref 26.0–34.0)
MCHC: 34.1 g/dL (ref 30.0–36.0)
MCV: 85.9 fL (ref 80.0–100.0)
Platelets: 230 10*3/uL (ref 150–400)
RBC: 5.53 MIL/uL (ref 4.22–5.81)
RDW: 12.9 % (ref 11.5–15.5)
WBC: 6.2 10*3/uL (ref 4.0–10.5)
nRBC: 0 % (ref 0.0–0.2)

## 2022-01-23 LAB — LIPASE, BLOOD: Lipase: 33 U/L (ref 11–51)

## 2022-01-23 MED ORDER — IOHEXOL 300 MG/ML  SOLN
100.0000 mL | Freq: Once | INTRAMUSCULAR | Status: AC | PRN
  Administered 2022-01-23: 100 mL via INTRAVENOUS

## 2022-01-23 NOTE — Discharge Instructions (Addendum)
You were seen in the emergency department for abdominal bloating and testicular pain.  As we discussed, your blood work and scans were reassuring today. The ultrasound shows you have a hydrocele, which is an accumulation of fluid around your testicle. You can take some over the counter medications like ibuprofen or tylenol for pain.  I've attached the contact information for the gastroenterologist (for your stomach issues) and the urologist (for the hydrocele). Please call them to schedule follow up appointments.  In the mean time keep taking the Famotidine (Pepcid) medicine as prescribed and keep track of the foods you have been eating to share with your primary doctor.   Continue to monitor how you're doing and return to the ER for new or worsening symptoms. _____________________________________________________________________________________________ Lo atendieron en el departamento de emergencias por hinchazn abdominal y dolor testicular.  Como comentamos, sus anlisis de [place] y sus exploraciones fueron tranquilizadores hoy. La ecografa muestra que tiene hidrocele, que es una acumulacin de lquido alrededor del testculo. Puede tomar algunos medicamentos de venta libre como ibuprofeno o tylenol para Conservation officer, historic buildings.  Adjunto la informacin de contacto del Interior and spatial designer (para tus Training and development officer) y del urlogo (para el hidrocele). Llmelos para programar citas de seguimiento.  Mientras tanto, siga tomando el medicamento Famotidina (Pepcid) segn lo recetado y Quarry manager un registro de los alimentos que ha estado comiendo para compartirlos con su mdico de atencin primaria.  Contine controlando cmo se encuentra y regrese a la sala de emergencias si los sntomas aparecen o empeoran.

## 2022-01-23 NOTE — ED Triage Notes (Signed)
Pt arrived via POV, c/o bloating after eating. Denies any n/v.  Pt states that he has been seen for same and told her was fine and he does not agree. Requesting CT or MRI to figure out what is going on.    Interpretation services used for triage.

## 2022-01-23 NOTE — ED Provider Notes (Signed)
[facility]-EMERGENCY DEPT Provider Note   CSN: [id number] Arrival date & time: 01/23/22  7169     History  Chief Complaint  Patient presents with   Bloated         Gabriel Ball is a 52 y.o. male with history of anxiety, herpes, shingles, and asthma who presents to the emergency department complaining of abdominal discomfort and bloating after eating. Patient previously seen at [facility] and ER with reassuring workups, recommended follow up with PCP and given prescription for PPI which he has been taking twice daily for the past 5 days. Returns today for a second opinion, requesting either CT or MRI as he "does not agree he is fine". States that everytime he eats it feels as though his stomach is distending and sometimes hurts to the touch. Having intermittent chest pains and pain in his lower back. Overall symptoms have been going on for 2 months. He has an appointment scheduled with his PCP on 1/24.   Also complaining of right testicular pain x 2 days. Started the day after receiving oral sex from his wife.   HPI     Home Medications Prior to Admission medications   Medication Sig Start Date End Date Taking? Authorizing Provider  amitriptyline (ELAVIL) 25 MG tablet Take 1 tablet (25 mg total) by mouth at bedtime. Start with 1/2 tablet for one week. If tolerating well, may increase to 1 tablet at bedtime 09/14/21 12/09/21  Madelyn Brunner, DO  cetirizine (ZYRTEC ALLERGY) 10 MG tablet Take 1 tablet (10 mg total) by mouth daily. Neomia Dear vez al dia 01/03/22   Rising, Rebecca, PA-C  diazepam (VALIUM) 5 MG tablet Take 5 mg by mouth every 6 (six) hours as needed for anxiety.    [provider]  dicyclomine (BENTYL) 20 MG tablet Take 1 tablet (20 mg total) by mouth 4 (four) times daily as needed (intestinal cramps). 12/03/21   Zenia Resides, MD  famotidine (PEPCID) 20 MG tablet Take 1 tablet (20 mg total) by mouth 2 (two) times daily. Una vez por la noche 01/18/22    Rondel Baton, MD  fluticasone [facility]) 50 MCG/ACT nasal spray Place 2 sprays into both nostrils daily. 12/09/21   Claiborne Rigg, NP  hydrocortisone cream 1 % Apply to affected area 2 times daily 12/01/21   Debby Freiberg, NP  hydrOXYzine (ATARAX) 25 MG tablet Take 1 tablet (25 mg total) by mouth 2 (two) times daily as needed for anxiety. 10/02/21   Bing Neighbors, FNP  methocarbamol (ROBAXIN) 500 MG tablet Take 1 tablet (500 mg total) by mouth 2 (two) times daily. 10/19/21   Jeannie Fend, PA-C  olopatadine (PATANOL) 0.1 % ophthalmic solution Place 1 drop into both eyes 2 (two) times daily. Dos veces al dia, los dos ojos 01/03/22   Rising, Lurena Joiner, PA-C  polyethylene glycol powder (MIRALAX) 17 GM/SCOOP powder Dissolve 1 capful (17 grams) in 4-8 ounces of water/juice and take by mouth 2 (two) times daily. 09/11/21   Hoy Register, MD  predniSONE (DELTASONE) 20 MG tablet Take 2 tablets (40 mg total) by mouth daily with breakfast for 5 days. 01/17/22 01/27/22  Zenia Resides, MD  tiZANidine (ZANAFLEX) 4 MG tablet Take 1 tablet (4 mg total) by mouth every 8 (eight) hours as needed for muscle spasms. 01/17/22   Zenia Resides, MD  valACYclovir (VALTREX) 1000 MG tablet Take 1 tablet (1,000 mg total) by mouth daily. 12/09/21   Claiborne Rigg, NP  SUMAtriptan (IMITREX) 50 MG tablet Take one for headache.  May repeat in 2 hours.  No more than 2 a day 05/27/17 12/09/21  Eustace Moore, MD      Allergies    Cyclobenzaprine, Ibuprofen, Methocarbamol, Amoxicillin, and Penicillins    Review of Systems   Review of Systems  Gastrointestinal:  Positive for abdominal distention and abdominal pain.  Genitourinary:  Positive for testicular pain.  Musculoskeletal:  Positive for back pain.  All other systems reviewed and are negative.   Physical Exam Updated Vital Signs BP (!) 154/133   Pulse 83   Temp 98.8 F (37.1 C) (Oral)   Resp 16   SpO2 96%  Physical Exam Vitals and  nursing note reviewed.  Constitutional:      Appearance: Normal appearance.  HENT:     Head: Normocephalic and atraumatic.  Eyes:     Conjunctiva/sclera: Conjunctivae normal.  Cardiovascular:     Rate and Rhythm: Normal rate and regular rhythm.  Pulmonary:     Effort: Pulmonary effort is normal. No respiratory distress.     Breath sounds: Normal breath sounds.  Abdominal:     General: There is no distension.     Palpations: Abdomen is soft.     Tenderness: There is no abdominal tenderness.     Hernia: There is no hernia in the left inguinal area or right inguinal area.  Genitourinary:    Penis: Uncircumcised.      Comments: Right testicle slightly enlarged with tenderness to palpation Lymphadenopathy:     Lower Body: No right inguinal adenopathy. No left inguinal adenopathy.  Skin:    General: Skin is warm and dry.  Neurological:     General: No focal deficit present.     Mental Status: He is alert.     ED Results / Procedures / Treatments   Labs (all labs ordered are listed, but only abnormal results are displayed) Labs Reviewed  COMPREHENSIVE METABOLIC PANEL - Abnormal; Notable for the following components:      Result Value   Glucose, Bld 105 (*)    All other components within normal limits  URINALYSIS, ROUTINE W REFLEX MICROSCOPIC - Abnormal; Notable for the following components:   Ketones, ur 20 (*)    All other components within normal limits  LIPASE, BLOOD  CBC    EKG None  Radiology US SCROTUM W/DOPPLER  Result Date: 01/23/2022 CLINICAL DATA:  Right testicular pain for 5 days. EXAM: SCROTAL ULTRASOUND DOPPLER ULTRASOUND OF THE TESTICLES TECHNIQUE: Complete ultrasound examination of the testicles, epididymis, and other scrotal structures was performed. Color and spectral Doppler ultrasound were also utilized to evaluate blood flow to the testicles. COMPARISON:  Abdominopelvic CT 01/23/2022. Scrotal ultrasound 11/23/2014. FINDINGS: Right testicle Measurements:  4.0 x 2.5 x 2.8 cm. No mass or microlithiasis visualized. Normal blood flow with color Doppler. Left testicle Measurements: 4.6 x 2.4 x 3.2 cm. No mass or microlithiasis visualized. Normal blood flow with color Doppler. Right epididymis:  Normal in size and appearance. Left epididymis:  Normal in size and appearance. Hydrocele:  Small right-sided hydrocele. Varicocele:  Small left-sided varicocele as previously demonstrated. Pulsed Doppler interrogation of both testes demonstrates normal low resistance arterial and venous waveforms bilaterally. IMPRESSION: 1. No evidence of testicular torsion or mass. 2. Small right-sided hydrocele. 3. Small left-sided varicocele. Electronically Signed   By: Carey Bullocks M.D.   On: 01/23/2022 13:44   CT ABDOMEN PELVIS W CONTRAST  Result Date: 01/23/2022 CLINICAL DATA:  Abdominal pain EXAM:  CT ABDOMEN AND PELVIS WITH CONTRAST TECHNIQUE: Multidetector CT imaging of the abdomen and pelvis was performed using the standard protocol following bolus administration of intravenous contrast. RADIATION DOSE REDUCTION: This exam was performed according to the departmental dose-optimization program which includes automated exposure control, adjustment of the mA and/or kV according to patient size and/or use of iterative reconstruction technique. CONTRAST:  OMNIPAQUE IOHEXOL 300 MG/ML  SOLN COMPARISON:  03/07/2020 FINDINGS: Lower chest: Unremarkable. Hepatobiliary: Gallbladder is not distended. There is no dilation of bile ducts. Pancreas: Unremarkable. Spleen: Unremarkable. Adrenals/Urinary Tract: Adrenals are not enlarged. There is no hydronephrosis. There are no renal or ureteral stones. Urinary bladder is unremarkable. Stomach/Bowel: Small hiatal hernia is seen. Stomach is not distended. Small bowel loops are not dilated. Appendix is not dilated. There is no significant wall thickening in colon. Scattered diverticula are seen without signs of focal diverticulitis.  Vascular/Lymphatic: Left renal vein is retroaortic. Few tiny arterial calcifications are seen. Reproductive: Prostate is enlarged in size. Other: There is no ascites or pneumoperitoneum. Small umbilical hernia containing fat is seen. Musculoskeletal: No acute findings are seen. IMPRESSION: No acute findings are seen. There is no evidence of intestinal obstruction or pneumoperitoneum. There is no hydronephrosis. Small hiatal hernia. Scattered diverticula are seen in colon without signs of focal diverticulitis. Other findings as described in the body of the report. Electronically Signed   By: Ernie Avena M.D.   On: 01/23/2022 12:03    Procedures Procedures    Medications Ordered in ED Medications  iohexol (OMNIPAQUE) 300 MG/ML solution 100 mL (100 mLs Intravenous Contrast Given 01/23/22 1138)    ED Course/ Medical Decision Making/ A&P                           Medical Decision Making Amount and/or Complexity of Data Reviewed Labs: ordered. Radiology: ordered.  Risk Prescription drug management.  This patient is a 53 y.o. male  who presents to the ED for concern of abdominal discomfort after eating and right testicular pain.   Differential diagnoses prior to evaluation: The emergent differential diagnosis includes, but is not limited to,  AAA, mesenteric ischemia, appendicitis, diverticulitis, DKA, gastroenteritis, nephrolithiasis, pancreatitis, constipation, UTI, bowel obstruction, biliary disease, IBD, PUD, hepatitis. This is not an exhaustive differential.   Past Medical History / Co-morbidities: Anxiety, herpes, shingles, asthma  Additional history: Chart reviewed. Pertinent results include: Seen in UC on 12/31 for chest pain and abdominal discomfort, normal workup and recommended to follow up with PCP. Seen in ER on 1/1 for similar symptoms, normal workup, recommended to follow up with PCP and given prescription for pepcid.   Physical Exam: Physical exam performed. The  pertinent findings include: Normal vital signs. Abdomen soft, non-tender. Right testicle slightly enlarged with tenderness to palpation. No hernia or adenopathy.   Lab Tests/Imaging studies: I personally interpreted labs/imaging and the pertinent results include:  CBC, BMP, lipase, and urinalysis all remarkable.   CT abdomen pelvis with small hiatal hernia, but no other acute intraabdominal findings. Low concern for that being the cause of patient's symptoms.  Scrotal US shows right sided hydrocele, left sided stable varicocele. I agree with the radiologist interpretation.  Disposition: After consideration of the diagnostic results and the patients response to treatment, I feel that emergency department workup does not suggest an emergent condition requiring admission or immediate intervention beyond what has been performed at this time. The plan is: discharge to home with information for gastroenterology and urology  follow ups. Patient also has PCP appointment scheduled for later this month. Recommended continuing Pepcid and keeping a food journal. Recommended scrotal support and over the counter meds for testicular pain. Low concern for acute infection of the GI tract or concern for pyocele/Fournier's. The patient is safe for discharge and has been instructed to return immediately for worsening symptoms, change in symptoms or any other concerns.  Final Clinical Impression(s) / ED Diagnoses Final diagnoses:  Abdominal bloating  Hydrocele of testis    Rx / DC Orders ED Discharge Orders     None      Portions of this report may have been transcribed using voice recognition software. Every effort was made to ensure accuracy; however, inadvertent computerized transcription errors may be present.    Estill Cotta 01/23/22 1432    Valarie Merino, MD 01/24/22 1615

## 2022-02-08 ENCOUNTER — Encounter (HOSPITAL_COMMUNITY): Payer: Self-pay

## 2022-02-08 ENCOUNTER — Ambulatory Visit (HOSPITAL_COMMUNITY)
Admission: EM | Admit: 2022-02-08 | Discharge: 2022-02-08 | Disposition: A | Discharge status: Home / Self Care | Payer: Self-pay | Attending: Internal Medicine | Admitting: Internal Medicine

## 2022-02-08 DIAGNOSIS — K0889 Other specified disorders of teeth and supporting structures: Secondary | ICD-10-CM

## 2022-02-08 DIAGNOSIS — K047 Periapical abscess without sinus: Secondary | ICD-10-CM

## 2022-02-08 MED ORDER — CLINDAMYCIN HCL 300 MG PO CAPS: 300.0000 mg | ORAL_CAPSULE | Freq: Two times a day (BID) | ORAL | 0 refills | Status: DC

## 2022-02-08 MED ORDER — ACETAMINOPHEN 325 MG PO TABS
650.0000 mg | ORAL_TABLET | Freq: Once | ORAL | Status: AC
  Administered 2022-02-08: 650 mg via ORAL

## 2022-02-08 MED ORDER — ACETAMINOPHEN 325 MG PO TABS
ORAL_TABLET | ORAL | Status: AC
  Filled 2022-02-08: qty 2

## 2022-02-08 NOTE — Discharge Instructions (Addendum)
English:  You have been given a written prescription for clindamycin, you will take this medication 2 times a day for the next 10 days.  Please be mindful that this medication can cause diarrhea, if you begin having uncontrolled episodes of continuous diarrhea please present for follow-up.   As discussed, you will need to call and schedule follow-up with a dentist tomorrow.  Spanish:   Le han dado una receta escrita para clindamicina; tomar este medicamento 2 veces al da [employer] prximos 10 das. Tenga en cuenta que este medicamento puede causar diarrea; si comienza a tener episodios incontrolados de diarrea continua, presntese para seguimiento.  Como se mencion, Art therapist y Risk manager un seguimiento con un Furniture conservator/restorer.

## 2022-02-08 NOTE — ED Triage Notes (Signed)
Pt is here for dental pain causing throat pain, ear pain lips tingling x 2days

## 2022-02-08 NOTE — ED Provider Notes (Signed)
[place]    CSN: [id number] Arrival date & time: 02/08/22  1809      History   Chief Complaint Chief Complaint  Patient presents with   Dental Pain    [name] is a 52 y.o. male.  Patient presents complaining of right sided lower dental pain that has been ongoing for the past 2 days.  Patient denies any recent trauma or injury to mouth. Patient reports that he has noticed facial pain and swelling on the right side of his jaw line.  Patient reports that he has had dental problems on this side 1 year ago when he had to have a root canal performed.  He states that he attempted to schedule an appointment with a dentist today but they were unable to see him in office.  Patient reports that he has not taken any medications for symptoms.   Patient denies any fever, drainage from site, chills, throat pain, difficulty swallowing, or any systemic symptoms.  Spanish interpreter was used for translation.  Dental Pain   Past Medical History:  Diagnosis Date   Asthma    Herpes    Shingles     Patient Active Problem List   Diagnosis Date Noted   Acute bilateral low back pain without sciatica 06/05/2020   Acute pain of right shoulder 06/05/2020   Asthma due to environmental allergies 12/20/2016   HSV (herpes simplex virus) infection 02/24/2015   Anxiety 04/02/2014    Past Surgical History:  Procedure Laterality Date   MOUTH SURGERY         Home Medications    Prior to Admission medications   Medication Sig Start Date End Date Taking? Authorizing Provider  cetirizine (ZYRTEC ALLERGY) 10 MG tablet Take 1 tablet (10 mg total) by mouth daily. Ardelia Mems vez al dia 01/03/22  Yes Rising, Rebecca, PA-C  clindamycin (CLEOCIN) 300 MG capsule Take 1 capsule (300 mg total) by mouth in the morning and at bedtime. 02/08/22  Yes Flossie Dibble, NP  diazepam (VALIUM) 5 MG tablet Take 5 mg by mouth every 6 (six) hours as needed for anxiety.   Yes [provider]  dicyclomine (BENTYL) 20 MG tablet Take 1 tablet (20 mg total) by mouth 4 (four) times daily as needed (intestinal cramps). 12/03/21  Yes Barrett Henle, MD  famotidine (PEPCID) 20 MG tablet Take 1 tablet (20 mg total) by mouth 2 (two) times daily. Una vez por la noche 01/18/22  Yes Fransico Meadow, MD  fluticasone [facility]) 50 MCG/ACT nasal spray Place 2 sprays into both nostrils daily. 12/09/21  Yes Gildardo Pounds, NP  hydrocortisone cream 1 % Apply to affected area 2 times daily 12/01/21  Yes Flossie Dibble, NP  hydrOXYzine (ATARAX) 25 MG tablet Take 1 tablet (25 mg total) by mouth 2 (two) times daily as needed for anxiety. 10/02/21  Yes Scot Jun, NP  methocarbamol (ROBAXIN) 500 MG tablet Take 1 tablet (500 mg total) by mouth 2 (two) times daily. 10/19/21  Yes Tacy Learn, PA-C  tiZANidine (ZANAFLEX) 4 MG tablet Take 1 tablet (4 mg total) by mouth every 8 (eight) hours as needed for muscle spasms. 01/17/22  Yes Barrett Henle, MD  valACYclovir (VALTREX) 1000 MG tablet Take 1 tablet (1,000 mg total) by mouth daily. 12/09/21  Yes Gildardo Pounds, NP  amitriptyline (ELAVIL) 25 MG tablet Take 1 tablet (25 mg total) by mouth at bedtime. Start with 1/2 tablet for one week.  If tolerating well, may increase to 1 tablet at bedtime 09/14/21 12/09/21  Elba Barman, DO  SUMAtriptan (IMITREX) 50 MG tablet Take one for headache.  May repeat in 2 hours.  No more than 2 a day 05/27/17 12/09/21  Raylene Everts, MD    Family History Family History  Problem Relation Age of Onset   Cancer Father     Social History Social History   Tobacco Use   Smoking status: Former   Smokeless tobacco: Never  Scientific laboratory technician Use: Never used  Substance Use Topics   Alcohol use: No   Drug use: No     Allergies   Cyclobenzaprine, Ibuprofen, Methocarbamol, Amoxicillin, and Penicillins   Review of Systems Review of Systems Per HPI  Physical Exam Triage Vital  Signs ED Triage Vitals  Enc Vitals Group     BP 02/08/22 1935 124/84     Pulse Rate 02/08/22 1935 91     Resp 02/08/22 1935 16     Temp 02/08/22 1935 98.4 F (36.9 C)     Temp Source 02/08/22 1935 Oral     SpO2 02/08/22 1935 97 %     Weight --      Height --      Head Circumference --      Peak Flow --      Pain Score 02/08/22 1933 6     Pain Loc --      Pain Edu? --      Excl. in [place]? --    No data found.  Updated Vital Signs BP 124/84 (BP Location: Left Arm)   Pulse 91   Temp 98.4 F (36.9 C) (Oral)   Resp 16   SpO2 97%     Physical Exam Vitals and nursing note reviewed.  Constitutional:      Appearance: Normal appearance.       Comments: Pain upon palpation of RT side of jaw line, no erythema and no noticeable swelling.   HENT:     Mouth/Throat:     Mouth: Mucous membranes are moist.     Dentition: Abnormal dentition. Dental tenderness and gingival swelling present. No dental caries or dental abscesses.     Pharynx: Oropharynx is clear. Uvula midline. No pharyngeal swelling, oropharyngeal exudate, posterior oropharyngeal erythema or uvula swelling.     Tonsils: No tonsillar exudate or tonsillar abscesses. 0 on the right. 0 on the left.      Comments: Pain upon palpation of gum line at area located on diagram.  Neurological:     Mental Status: He is alert.      UC Treatments / Results  Labs (all labs ordered are listed, but only abnormal results are displayed) Labs Reviewed - No data to display  EKG   Radiology No results found.  Procedures Procedures (including critical care time)  Medications Ordered in UC Medications  acetaminophen (TYLENOL) tablet 650 mg (650 mg Oral Given 02/08/22 1945)    Initial Impression / Assessment and Plan / UC Course  I have reviewed the triage vital signs and the nursing notes.  Pertinent labs & imaging results that were available during my care of the patient were reviewed by me and considered in my medical  decision making (see chart for details).     Patient was evaluated for dental infection and dentalgia.  Clindamycin antibiotic was prescribed due to patient's history of penicillin allergies.  Patient was made aware of medication regiment.  Patient was made aware that  he will need to go ahead and schedule follow-up with a dentist.  Patient was made aware of potential side effects of clindamycin and when to present for follow-up.  Patient verbalized understanding of instructions.   Charting was provided using a a verbal dictation system, charting was proofread for errors, errors may occur which could change the meaning of the information charted.   Final Clinical Impressions(s) / UC Diagnoses   Final diagnoses:  Dental infection  Dentalgia     Discharge Instructions      English:  You have been given a written prescription for clindamycin, you will take this medication 2 times a day for the next 10 days.  Please be mindful that this medication can cause diarrhea, if you begin having uncontrolled episodes of continuous diarrhea please present for follow-up.   As discussed, you will need to call and schedule follow-up with a dentist tomorrow.  Spanish:   Le han dado una receta escrita para clindamicina; tomar este medicamento 2 veces al da [employer] prximos [address]. Tenga en cuenta que este medicamento puede causar diarrea; si comienza a tener episodios incontrolados de diarrea continua, presntese para seguimiento.  Como se mencion, Multimedia programmer y Charity fundraiser un seguimiento con un Armed forces operational officer.     ED Prescriptions     Medication Sig Dispense Auth. Provider   clindamycin (CLEOCIN) 300 MG capsule Take 1 capsule (300 mg total) by mouth in the morning and at bedtime. 20 capsule Debby Freiberg, NP      PDMP not reviewed this encounter.   Debby Freiberg, NP 02/08/22 2040

## 2022-02-10 ENCOUNTER — Ambulatory Visit: Payer: Self-pay | Attending: Physician Assistant | Admitting: Physician Assistant

## 2022-02-10 ENCOUNTER — Encounter: Payer: Self-pay | Admitting: Physician Assistant

## 2022-02-10 VITALS — BP 105/67 | HR 73 | Ht 66.5 in | Wt 185.0 lb

## 2022-02-10 DIAGNOSIS — Z789 Other specified health status: Secondary | ICD-10-CM

## 2022-02-10 DIAGNOSIS — Z09 Encounter for follow-up examination after completed treatment for conditions other than malignant neoplasm: Secondary | ICD-10-CM

## 2022-02-10 DIAGNOSIS — N432 Other hydrocele: Secondary | ICD-10-CM

## 2022-02-10 DIAGNOSIS — K047 Periapical abscess without sinus: Secondary | ICD-10-CM

## 2022-02-10 DIAGNOSIS — Z8042 Family history of malignant neoplasm of prostate: Secondary | ICD-10-CM

## 2022-02-10 NOTE — Progress Notes (Signed)
Patient ID: Gabriel Ball, male   DOB: 31-Mar-1970, 52 y.o.   MRN: [id number]   Gabriel Ball, is a 52 y.o. male  SNK:[id number]  PFX:[id number]  DOB - 04-Feb-1970  Chief Complaint  Patient presents with   Hospitalization Follow-up       Subjective:   Gabriel Ball is a 52 y.o. male here today for a follow up visit since being seen at ED 1/6 and 1/22 for abd/testicular pain and dental infections respectively.  He was diagnosed with a small hydrocele and varicocele on 1/6.  No longer having abdominal or testicular pain.     Has an appt with dentist on March 5.  Taking clindamycin and tolerating it well except for some loose BM.  No bloody or painful diarrhea or abdominal pain.    His dad has been diagnosed with prostate cancer, so he wants to be checked for that.    No problems updated.  ALLERGIES: Allergies  Allergen Reactions   Cyclobenzaprine     Facial Numbness/dizziness/hypersomnolence   Ibuprofen Hives   Methocarbamol     Abdominal pain   Amoxicillin Itching    Pruritic rash   Penicillins Palpitations    Has patient had a PCN reaction causing immediate rash, facial/tongue/throat swelling, SOB or lightheadedness with hypotension: No Has patient had a PCN reaction causing severe rash involving mucus membranes or skin necrosis: No Has patient had a PCN reaction that required hospitalization: No Has patient had a PCN reaction occurring within the last 10 years: No If all of the above answers are "NO", then may proceed with Cephalosporin use.    PAST MEDICAL HISTORY: Past Medical History:  Diagnosis Date   Asthma    Herpes    Shingles     MEDICATIONS AT HOME: Prior to Admission medications   Medication Sig Start Date End Date Taking? Authorizing Provider  amitriptyline (ELAVIL) 25 MG tablet Take 1 tablet (25 mg total) by mouth at bedtime. Start with 1/2 tablet for one week. If tolerating well, may increase to 1 tablet at bedtime 09/14/21 12/09/21   Elba Barman, DO  cetirizine (ZYRTEC ALLERGY) 10 MG tablet Take 1 tablet (10 mg total) by mouth daily. Ardelia Mems vez al dia 01/03/22   Rising, Rebecca, PA-C  clindamycin (CLEOCIN) 300 MG capsule Take 1 capsule (300 mg total) by mouth in the morning and at bedtime. 02/08/22   Flossie Dibble, NP  diazepam (VALIUM) 5 MG tablet Take 5 mg by mouth every 6 (six) hours as needed for anxiety.    [provider]  dicyclomine (BENTYL) 20 MG tablet Take 1 tablet (20 mg total) by mouth 4 (four) times daily as needed (intestinal cramps). 12/03/21   Barrett Henle, MD  famotidine (PEPCID) 20 MG tablet Take 1 tablet (20 mg total) by mouth 2 (two) times daily. Una vez por la noche 01/18/22   Fransico Meadow, MD  fluticasone [facility]) 50 MCG/ACT nasal spray Place 2 sprays into both nostrils daily. 12/09/21   Gildardo Pounds, NP  hydrocortisone cream 1 % Apply to affected area 2 times daily 12/01/21   Flossie Dibble, NP  hydrOXYzine (ATARAX) 25 MG tablet Take 1 tablet (25 mg total) by mouth 2 (two) times daily as needed for anxiety. 10/02/21   Scot Jun, NP  methocarbamol (ROBAXIN) 500 MG tablet Take 1 tablet (500 mg total) by mouth 2 (two) times daily. 10/19/21   Tacy Learn, PA-C  tiZANidine (ZANAFLEX) 4 MG tablet Take 1  tablet (4 mg total) by mouth every 8 (eight) hours as needed for muscle spasms. 01/17/22   Barrett Henle, MD  valACYclovir (VALTREX) 1000 MG tablet Take 1 tablet (1,000 mg total) by mouth daily. 12/09/21   Gildardo Pounds, NP  SUMAtriptan (IMITREX) 50 MG tablet Take one for headache.  May repeat in 2 hours.  No more than 2 a day 05/27/17 12/09/21  Raylene Everts, MD    ROS: Neg HEENT Neg resp Neg cardiac Neg MS Neg psych Neg neuro  Objective:   Vitals:   02/10/22 1056  BP: 105/67  Pulse: 73  SpO2: 96%  Weight: 185 lb (83.9 kg)  Height: 5' 6.5" (1.689 m)   Exam General appearance : Awake, alert, not in any distress. Speech Clear. Not toxic  looking HEENT: Atraumatic and Normocephalic.  Gums around site of infection seem to be improving.  No definite abscess Neck: Supple, no JVD. No cervical lymphadenopathy.  Chest: Good air entry bilaterally, CTAB.  No rales/rhonchi/wheezing CVS: S1 S2 regular, no murmurs.  Extremities: B/L Lower Ext shows no edema, both legs are warm to touch Neurology: Awake alert, and oriented X 3, CN II-XII intact, Non focal Skin: No Rash  Data Review Lab Results  Component Value Date   HGBA1C 5.4 09/11/2021   HGBA1C 5.3 11/19/2019   HGBA1C 5.1 01/25/2018    Assessment & Plan   1. Other hydrocele No action needed currently-identified as small on U/S and not causing a problem  2. Family history of prostate cancer - PSA  3. Dental infection Continue/complete clindamycin.  He verbalizes understanding to go to UC or ED if he develops abdominal pain, fever, intractable diarrhea or hematochezia  4. Encounter for examination following treatment at hospital Doing well  5. Language barrier [employer] "[employer]" interpreters used and additional time performing visit was required.   Return if symptoms worsen or fail to improve.  The patient was given clear instructions to go to ER or return to medical center if symptoms don't improve, worsen or new problems develop. The patient verbalized understanding. The patient was told to call to get lab results if they haven't heard anything in the next week.      Freeman Caldron, PA-C [facility] [place], [place]   02/10/2022, 11:21 AM

## 2022-02-11 LAB — PSA: Prostate Specific Ag, Serum: 1.5 ng/mL (ref 0.0–4.0)

## 2022-02-16 ENCOUNTER — Other Ambulatory Visit: Payer: Self-pay

## 2022-02-16 ENCOUNTER — Ambulatory Visit (HOSPITAL_COMMUNITY)
Admission: EM | Admit: 2022-02-16 | Discharge: 2022-02-16 | Disposition: A | Discharge status: Home / Self Care | Payer: Self-pay | Attending: Internal Medicine | Admitting: Internal Medicine

## 2022-02-16 ENCOUNTER — Encounter (HOSPITAL_COMMUNITY): Payer: Self-pay

## 2022-02-16 DIAGNOSIS — A084 Viral intestinal infection, unspecified: Secondary | ICD-10-CM

## 2022-02-16 MED ORDER — ONDANSETRON 4 MG PO TBDP
4.0000 mg | ORAL_TABLET | Freq: Once | ORAL | Status: AC
  Administered 2022-02-16: 4 mg via ORAL

## 2022-02-16 MED ORDER — ONDANSETRON 4 MG PO TBDP
ORAL_TABLET | ORAL | Status: AC
  Filled 2022-02-16: qty 1

## 2022-02-16 MED ORDER — ONDANSETRON 4 MG PO TBDP
4.0000 mg | ORAL_TABLET | Freq: Three times a day (TID) | ORAL | 0 refills | Status: DC | PRN
  Filled 2022-02-16: qty 20, 7d supply, fill #0

## 2022-02-16 NOTE — Discharge Instructions (Signed)
Your evaluation suggests that your symptoms are most likely due to viral stomach illness (gastroenteritis) which will improve on its own with rest and fluids in the next few days.   I have prescribed an antinausea medication for you to take at home called Zofran. It is the same medication that we gave you in the office.  You may use tylenol 1,000mg every 6 hours over the counter as needed for abdominal discomfort related to this virus.   Eat a bland diet for the next 12-24 hours (bananas, rice, white toast, and applesauce) once you are able to tolerate liquids (broth, etc). These foods are easy for your stomach to digest. Pedialyte can be purchased to help with rehydration. Drink plenty of water.   Please follow up with your primary care provider for further management. Return if you experience worsening or uncontrolled pain, inability to tolerate fluids by mouth, difficulty breathing, fevers 100.4F or greater, recurrent vomiting, or any other concerning symptoms. I hope you feel better!  

## 2022-02-16 NOTE — ED Triage Notes (Signed)
Pt states ate at 6pm and started having abdominal cramping and vomiting at 9pm. States vomit >10xs. States has to use his finger to start the vomiting and then he feels better for an hour and a half. Denies taking any meds.

## 2022-02-16 NOTE — ED Provider Notes (Signed)
[place]    CSN: [id number] Arrival date & time: 02/16/22  1601      History   Chief Complaint Chief Complaint  Patient presents with   Emesis    [name] is a 52 y.o. male.   Patient presents urgent care for evaluation of nausea, vomiting, and increased defecation since last night when he ate a compos (Hispanic large bean).  He states that he has never eaten this before and became very nauseous approximately 1 to 2 hours after eating the beans.  He has had multiple episodes of nonbloody/nonbilious emesis overnight.  He states he was nauseous initially and his stomach became very bloated so he induced vomiting with his fingers so that he would feel better.  He states he feels much better after vomiting but has gone to the bathroom to defecate multiple times throughout the night as well.  Bowel movements have been formed and not watery.  He denies diarrhea.  He is experiencing what he describes as inflammation to the stomach and rates the pain at a 2 on a scale of 0-10 to the generalized abdomen.  Denies recent antibiotic use, flank pain, urinary symptoms, fever/chills, heart palpitations, blood/mucus in the stool.  Last episode of emesis was at 7 AM this morning approximately 2 hours ago.  He has been able to tolerate clear liquids since then and has not attempted use of any over-the-counter medicines prior to arrival urgent care for symptoms.   The history is provided by the patient. A language interpreter was used (Spanish interpreter used entirety of visit).  Emesis   Past Medical History:  Diagnosis Date   Asthma    Herpes    Shingles     Patient Active Problem List   Diagnosis Date Noted   Acute bilateral low back pain without sciatica 06/05/2020   Acute pain of right shoulder 06/05/2020   Asthma due to environmental allergies 12/20/2016   HSV (herpes simplex virus) infection 02/24/2015   Anxiety 04/02/2014    Past Surgical History:   Procedure Laterality Date   MOUTH SURGERY         Home Medications    Prior to Admission medications   Medication Sig Start Date End Date Taking? Authorizing Provider  ondansetron (ZOFRAN-ODT) 4 MG disintegrating tablet Take 1 tablet (4 mg total) by mouth every 8 (eight) hours as needed for nausea or vomiting. 02/16/22  Yes Tyrhonda Georgiades, Stasia Cavalier, FNP  cetirizine (ZYRTEC ALLERGY) 10 MG tablet Take 1 tablet (10 mg total) by mouth daily. Ardelia Mems vez al dia 01/03/22   Rising, Rebecca, PA-C  clindamycin (CLEOCIN) 300 MG capsule Take 1 capsule (300 mg total) by mouth in the morning and at bedtime. 02/08/22   Flossie Dibble, NP  famotidine (PEPCID) 20 MG tablet Take 1 tablet (20 mg total) by mouth 2 (two) times daily. Una vez por la noche 01/18/22   Fransico Meadow, MD  fluticasone [facility]) 50 MCG/ACT nasal spray Place 2 sprays into both nostrils daily. 12/09/21   Gildardo Pounds, NP  tiZANidine (ZANAFLEX) 4 MG tablet Take 1 tablet (4 mg total) by mouth every 8 (eight) hours as needed for muscle spasms. 01/17/22   Barrett Henle, MD  valACYclovir (VALTREX) 1000 MG tablet Take 1 tablet (1,000 mg total) by mouth daily. 12/09/21   Gildardo Pounds, NP  SUMAtriptan (IMITREX) 50 MG tablet Take one for headache.  May repeat in 2 hours.  No more than 2 a day 05/27/17  12/09/21  Raylene Everts, MD    Family History Family History  Problem Relation Age of Onset   Cancer Father     Social History Social History   Tobacco Use   Smoking status: Former   Smokeless tobacco: Never  Scientific laboratory technician Use: Never used  Substance Use Topics   Alcohol use: No   Drug use: No     Allergies   Cyclobenzaprine, Ibuprofen, Methocarbamol, Amoxicillin, and Penicillins   Review of Systems Review of Systems  Gastrointestinal:  Positive for vomiting.  Per HPI   Physical Exam Triage Vital Signs ED Triage Vitals  Enc Vitals Group     BP 02/16/22 0832 112/73     Pulse Rate 02/16/22 0832  (!) 114     Resp 02/16/22 0832 18     Temp 02/16/22 0832 99.4 F (37.4 C)     Temp Source 02/16/22 0832 Oral     SpO2 02/16/22 0832 99 %     Weight --      Height --      Head Circumference --      Peak Flow --      Pain Score 02/16/22 0833 4     Pain Loc --      Pain Edu? --      Excl. in [place]? --    No data found.  Updated Vital Signs BP 112/73 (BP Location: Left Arm)   Pulse (!) 114   Temp 99.4 F (37.4 C) (Oral)   Resp 18   SpO2 99%   Visual Acuity Right Eye Distance:   Left Eye Distance:   Bilateral Distance:    Right Eye Near:   Left Eye Near:    Bilateral Near:     Physical Exam Vitals and nursing note reviewed.  Constitutional:      Appearance: He is not ill-appearing or toxic-appearing.  HENT:     Head: Normocephalic and atraumatic.     Right Ear: Hearing and external ear normal.     Left Ear: Hearing and external ear normal.     Nose: Nose normal.     Mouth/Throat:     Lips: Pink.  Eyes:     General: Lids are normal. Vision grossly intact. Gaze aligned appropriately.     Extraocular Movements: Extraocular movements intact.     Conjunctiva/sclera: Conjunctivae normal.  Cardiovascular:     Rate and Rhythm: Normal rate and regular rhythm.     Heart sounds: Normal heart sounds, S1 normal and S2 normal.  Pulmonary:     Effort: Pulmonary effort is normal. No respiratory distress.     Breath sounds: Normal breath sounds and air entry.  Abdominal:     General: Bowel sounds are normal.     Palpations: Abdomen is soft.     Tenderness: There is no abdominal tenderness. There is no right CVA tenderness, left CVA tenderness or guarding.  Musculoskeletal:     Cervical back: Neck supple.  Skin:    General: Skin is warm and dry.     Capillary Refill: Capillary refill takes less than 2 seconds.     Findings: No rash.  Neurological:     General: No focal deficit present.     Mental Status: He is alert and oriented to person, place, and time. Mental status is  at baseline.     Cranial Nerves: No dysarthria or facial asymmetry.  Psychiatric:        Mood and Affect: Mood normal.  Speech: Speech normal.        Behavior: Behavior normal.        Thought Content: Thought content normal.        Judgment: Judgment normal.      UC Treatments / Results  Labs (all labs ordered are listed, but only abnormal results are displayed) Labs Reviewed - No data to display  EKG   Radiology No results found.  Procedures Procedures (including critical care time)  Medications Ordered in UC Medications  ondansetron (ZOFRAN-ODT) disintegrating tablet 4 mg (4 mg Oral Given 02/16/22 1191)    Initial Impression / Assessment and Plan / UC Course  I have reviewed the triage vital signs and the nursing notes.  Pertinent labs & imaging results that were available during my care of the patient were reviewed by me and considered in my medical decision making (see chart for details).   1. Viral gastroenteritis Presentation is consistent with acute viral gastroenteritis that will likely improve with rest, increased fluid intake, and as needed use of antiemetics. Abdominal exam is without peritoneal signs, patient is nontoxic in appearance, and vitals are hemodynamically stable. Push fluids with water and pedialyte for rehydration.  Zofran 4 mg ODT given in clinic for nausea and vomiting. May use zofran 4mg  ODT every 8 hours as needed for nausea and vomiting. May use Tylenol for abdominal discomfort at home related to viral illness. Bland/liquid diet recommended for 12-24 hours, then increase diet to normal as tolerated.    Discussed physical exam and available lab work findings in clinic with patient.  Counseled patient regarding appropriate use of medications and potential side effects for all medications recommended or prescribed today. Discussed red flag signs and symptoms of worsening condition,when to call the PCP office, return to urgent care, and when to  seek higher level of care in the emergency department. Patient verbalizes understanding and agreement with plan. All questions answered. Patient discharged in stable condition.    Final Clinical Impressions(s) / UC Diagnoses   Final diagnoses:  Viral gastroenteritis     Discharge Instructions      Your evaluation suggests that your symptoms are most likely due to viral stomach illness (gastroenteritis) which will improve on its own with rest and fluids in the next few days.   I have prescribed an antinausea medication for you to take at home called Zofran. It is the same medication that we gave you in the office.  You may use tylenol 1,000mg  every 6 hours over the counter as needed for abdominal discomfort related to this virus.   Eat a bland diet for the next 12-24 hours (bananas, rice, white toast, and applesauce) once you are able to tolerate liquids (broth, etc). These foods are easy for your stomach to digest. Pedialyte can be purchased to help with rehydration. Drink plenty of water.   Please follow up with your primary care provider for further management. Return if you experience worsening or uncontrolled pain, inability to tolerate fluids by mouth, difficulty breathing, fevers 100.53F or greater, recurrent vomiting, or any other concerning symptoms. I hope you feel better!    ED Prescriptions     Medication Sig Dispense Auth. Provider   ondansetron (ZOFRAN-ODT) 4 MG disintegrating tablet Take 1 tablet (4 mg total) by mouth every 8 (eight) hours as needed for nausea or vomiting. 20 tablet Talbot Grumbling, FNP      PDMP not reviewed this encounter.   Joella Prince [place], [place] 02/16/22 [phone]

## 2022-02-17 ENCOUNTER — Encounter (HOSPITAL_COMMUNITY): Payer: Self-pay

## 2022-02-17 ENCOUNTER — Ambulatory Visit (HOSPITAL_COMMUNITY)
Admission: EM | Admit: 2022-02-17 | Discharge: 2022-02-17 | Disposition: A | Discharge status: Home / Self Care | Payer: Self-pay | Attending: Family Medicine | Admitting: Family Medicine

## 2022-02-17 DIAGNOSIS — B009 Herpesviral infection, unspecified: Secondary | ICD-10-CM | POA: Insufficient documentation

## 2022-02-17 DIAGNOSIS — Z8619 Personal history of other infectious and parasitic diseases: Secondary | ICD-10-CM | POA: Insufficient documentation

## 2022-02-17 DIAGNOSIS — N489 Disorder of penis, unspecified: Secondary | ICD-10-CM | POA: Insufficient documentation

## 2022-02-17 LAB — POCT URINALYSIS DIPSTICK, ED / UC
Bilirubin Urine: NEGATIVE
Glucose, UA: NEGATIVE mg/dL
Hgb urine dipstick: NEGATIVE
Ketones, ur: NEGATIVE mg/dL
Leukocytes,Ua: NEGATIVE
Nitrite: NEGATIVE
Protein, ur: NEGATIVE mg/dL
Specific Gravity, Urine: 1.01 (ref 1.005–1.030)
Urobilinogen, UA: 0.2 mg/dL (ref 0.0–1.0)
pH: 7 (ref 5.0–8.0)

## 2022-02-17 MED ORDER — VALACYCLOVIR HCL 1 G PO TABS
1000.0000 mg | ORAL_TABLET | Freq: Two times a day (BID) | ORAL | 0 refills | Status: DC
  Filled 2022-02-17 – 2022-05-05 (×2): qty 14, 7d supply, fill #0

## 2022-02-17 NOTE — Discharge Instructions (Signed)
Me preocupa que tenga una lesin debido a su historial de herpes que le est causando dolor. Inicie Valtrex [employer]. Mantenga el rea limpia con agua y [place]. Nos comunicaremos con usted si alguno de sus resultados de laboratorio es anormal. Seguimiento con Development worker, community. Si tiene algn sntoma que empeora o cambia, incluido dolor testicular intenso, dolor abdominal, fiebre, nuseas o vmitos, debe ser atendido de inmediato.

## 2022-02-17 NOTE — ED Triage Notes (Signed)
Chief Complaint: Patient states he is has an infection. Patient having pain and burning in the testicle. States there was itching first and he scratched until it hurt. Left testicle. No falls or known injuries.   Onset: Sunday   Prescriptions or OTC medications tried: Yes- tylenol    with no relief

## 2022-02-17 NOTE — ED Provider Notes (Signed)
Gabriel Ball    CSN: [id number] Arrival date & time: 02/17/22  [place]      History   Chief Complaint Chief Complaint  Patient presents with   Testicle Pain    [name] is a 52 y.o. male.   Patient presents today with a several day history of left testicular pain.  He reports a lesion on the base of his left penis that is painful.  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  He is sexually active but denies any specific concern for STI.  Reports that pain is rated 7 on a 0-10 pain scale, described as aching, no aggravating relieving factors identified.  He was seen by clinic yesterday for GI symptoms and reports that these have since resolved.  Denies any fever, nausea, vomiting, melena, hematochezia.  He has been using topical nystatin/triamcinolone cream without improvement of symptoms.  He reports the pain is described as a burning/stinging sensation.  Denies any dysuria, urinary frequency, urinary urgency.  Denies any recent antibiotics or medication changes.  He has been seen for similar symptoms in the past and had scrotal ultrasound as recently as 01/23/2022 which showed left varicocele and right hydrocele but was otherwise normal.  He reports the current pain is different than previous episodes of condition.    Past Medical History:  Diagnosis Date   Asthma    Herpes    Shingles     Patient Active Problem List   Diagnosis Date Noted   Acute bilateral low back pain without sciatica 06/05/2020   Acute pain of right shoulder 06/05/2020   Asthma due to environmental allergies 12/20/2016   HSV (herpes simplex virus) infection 02/24/2015   Anxiety 04/02/2014    Past Surgical History:  Procedure Laterality Date   MOUTH SURGERY         Home Medications    Prior to Admission medications   Medication Sig Start Date End Date Taking? Authorizing Provider  clindamycin (CLEOCIN) 300 MG capsule Take 1 capsule (300 mg total) by  mouth in the morning and at bedtime. 02/08/22  Yes Gabriel Dibble, NP  nystatin-triamcinolone ointment (MYCOLOG) Apply 1 Application topically 2 (two) times daily.   Yes [provider]  cetirizine (ZYRTEC ALLERGY) 10 MG tablet Take 1 tablet (10 mg total) by mouth daily. Gabriel Ball 01/03/22   Gabriel Ball, Gabriel Guiles, PA-C  famotidine (PEPCID) 20 MG tablet Take 1 tablet (20 mg total) by mouth 2 (two) times daily. Gabriel Ball 01/18/22   Gabriel Meadow, MD  fluticasone [facility]) 50 MCG/ACT nasal spray Place 2 sprays into both nostrils daily. 12/09/21   Gabriel Pounds, NP  ondansetron (ZOFRAN-ODT) 4 MG disintegrating tablet Take 1 tablet (4 mg total) by mouth every 8 (eight) hours as needed for nausea or vomiting. 02/16/22   Gabriel Grumbling, FNP  tiZANidine (ZANAFLEX) 4 MG tablet Take 1 tablet (4 mg total) by mouth every 8 (eight) hours as needed for muscle spasms. 01/17/22   Gabriel Henle, MD  valACYclovir (VALTREX) 1000 MG tablet Take 1 tablet (1,000 mg total) by mouth 2 (two) times daily. 02/17/22   Gabriel Ball, Gabriel Skill, PA-C  SUMAtriptan (IMITREX) 50 MG tablet Take one for headache.  May repeat in 2 hours.  No more than 2 a day 05/27/17 12/09/21  Gabriel Everts, MD    Family History Family History  Problem Relation Age of Onset   Cancer Father  Social History Social History   Tobacco Use   Smoking status: Former   Smokeless tobacco: Never  Scientific laboratory technician Use: Never used  Substance Use Topics   Alcohol use: No   Drug use: No     Allergies   Cyclobenzaprine, Ibuprofen, Methocarbamol, Amoxicillin, and Penicillins   Review of Systems Review of Systems  Constitutional:  Negative for activity change, appetite change, fatigue and fever.  Gastrointestinal:  Negative for abdominal pain, diarrhea, nausea and vomiting.  Genitourinary:  Positive for genital sores, penile pain and testicular pain. Negative for dysuria, frequency and penile discharge.      Physical Exam Triage Vital Signs ED Triage Vitals  Enc Vitals Group     BP 02/17/22 1935 115/72     Pulse Rate 02/17/22 1935 75     Resp 02/17/22 1935 16     Temp 02/17/22 1935 98.6 F (37 C)     Temp Source 02/17/22 1935 Oral     SpO2 02/17/22 1935 96 %     Weight 02/17/22 1934 184 lb 15.5 oz (83.9 kg)     Height 02/17/22 1934 5' 6.5" (1.689 m)     Head Circumference --      Peak Flow --      Pain Score 02/17/22 1932 0     Pain Loc --      Pain Edu? --      Excl. in [place]? --    No data found.  Updated Vital Signs BP 115/72 (BP Location: Left Arm)   Pulse 75   Temp 98.6 F (37 C) (Oral)   Resp 16   Ht 5' 6.5" (1.689 m)   Wt 184 lb 15.5 oz (83.9 kg)   SpO2 96%   BMI 29.41 kg/m   Visual Acuity Right Eye Distance:   Left Eye Distance:   Bilateral Distance:    Right Eye Near:   Left Eye Near:    Bilateral Near:     Physical Exam Vitals reviewed. Exam conducted with a chaperone present.  Constitutional:      General: He is awake.     Appearance: Normal appearance. He is well-developed. He is not ill-appearing.     Comments: Very pleasant male appears stated age in no acute distress sitting comfortably in exam room  HENT:     Head: Normocephalic and atraumatic.     Mouth/Throat:     Pharynx: Uvula midline. No oropharyngeal exudate or posterior oropharyngeal erythema.  Cardiovascular:     Rate and Rhythm: Normal rate and regular rhythm.     Heart sounds: Normal heart sounds, S1 normal and S2 normal. No murmur heard. Pulmonary:     Effort: Pulmonary effort is normal.     Breath sounds: Normal breath sounds. No stridor. No wheezing, rhonchi or rales.     Comments: Clear to auscultation bilaterally Abdominal:     General: Bowel sounds are normal.     Palpations: Abdomen is soft.     Tenderness: There is no abdominal tenderness. There is no right CVA tenderness, left CVA tenderness, guarding or rebound.     Hernia: There is no hernia in the left inguinal  area or right inguinal area.  Genitourinary:    Penis: Uncircumcised. Lesions present.      Epididymis:     Right: Normal. Not inflamed or enlarged.     Left: Normal. Not inflamed or enlarged.       Comments: [name], NP present as chaperone during exam.  No tenderness or inflammation of testicles noted. Neurological:     Mental Status: He is alert.  Psychiatric:        Behavior: Behavior is cooperative.      UC Treatments / Results  Labs (all labs ordered are listed, but only abnormal results are displayed) Labs Reviewed  HSV CULTURE AND TYPING  POCT URINALYSIS DIPSTICK, ED / UC  CYTOLOGY, (ORAL, ANAL, URETHRAL) ANCILLARY ONLY    EKG   Radiology No results found.  Procedures Procedures (including critical care time)  Medications Ordered in UC Medications - No data to display  Initial Impression / Assessment and Plan / UC Course  I have reviewed the triage vital signs and the nursing notes.  Pertinent labs & imaging results that were available during my care of the patient were reviewed by me and considered in my medical decision making (see chart for details).     Ulcerated lesion noted at base of penis at site of patient's pain.  Suspect this is related to recurrent herpes.  UA was normal in clinic today.  HSV swab was collected and is pending.  Will restart valacyclovir 1000 mg twice daily for 7 days.  He was encouraged to keep area clean with soap and water in order to avoid secondary infection.  STI swab was collected today and is pending.  Syphilis testing was deferred by patient and low suspicion for this as etiology of symptoms given lesion is painful.  No evidence of torsion or significant testicular injury.  Discussed that if his symptoms or not improving he should follow-up with urology and was given contact information for local provider.  Discussed that if he has any worsening or changing symptoms he needs to be seen immediately.  Strict return precautions  given.  Final Clinical Impressions(s) / UC Diagnoses   Final diagnoses:  Penile lesion  History of herpes genitalis     Discharge Instructions      Me preocupa que tenga Gabriel lesin debido a su historial de herpes que le est causando dolor. Inicie [employer]. Mantenga el rea limpia con agua y [place]. Nos comunicaremos con usted si alguno de sus resultados de laboratorio es anormal. Seguimiento con Development worker, community. Si tiene algn sntoma que empeora o cambia, incluido dolor testicular intenso, dolor abdominal, fiebre, nuseas o vmitos, debe ser atendido de inmediato.     ED Prescriptions     Medication Sig Dispense Auth. Provider   valACYclovir (VALTREX) 1000 MG tablet Take 1 tablet (1,000 mg total) by mouth 2 (two) times daily. 14 tablet Avabella Wailes, Gabriel Skill, PA-C      PDMP not reviewed this encounter.   Terrilee Croak, PA-C 02/17/22 2036

## 2022-02-18 ENCOUNTER — Other Ambulatory Visit: Payer: Self-pay

## 2022-02-21 LAB — HSV CULTURE AND TYPING

## 2022-02-24 LAB — CYTOLOGY, (ORAL, ANAL, URETHRAL) ANCILLARY ONLY
Chlamydia: NEGATIVE
Comment: NEGATIVE
Comment: NEGATIVE
Comment: NORMAL
Neisseria Gonorrhea: NEGATIVE
Trichomonas: NEGATIVE

## 2022-05-05 ENCOUNTER — Other Ambulatory Visit: Payer: Self-pay

## 2022-05-26 ENCOUNTER — Encounter: Payer: Self-pay | Admitting: Physician Assistant

## 2022-05-26 ENCOUNTER — Other Ambulatory Visit: Payer: Self-pay

## 2022-05-26 ENCOUNTER — Ambulatory Visit: Payer: Self-pay | Attending: Physician Assistant | Admitting: Physician Assistant

## 2022-05-26 VITALS — BP 106/68 | HR 59 | Wt 186.6 lb

## 2022-05-26 DIAGNOSIS — Z603 Acculturation difficulty: Secondary | ICD-10-CM

## 2022-05-26 DIAGNOSIS — K649 Unspecified hemorrhoids: Secondary | ICD-10-CM

## 2022-05-26 DIAGNOSIS — F419 Anxiety disorder, unspecified: Secondary | ICD-10-CM

## 2022-05-26 DIAGNOSIS — Z758 Other problems related to medical facilities and other health care: Secondary | ICD-10-CM

## 2022-05-26 DIAGNOSIS — Z1211 Encounter for screening for malignant neoplasm of colon: Secondary | ICD-10-CM

## 2022-05-26 MED ORDER — HYDROCORTISONE ACETATE 1 % EX OINT
1.0000 | TOPICAL_OINTMENT | CUTANEOUS | 2 refills | Status: DC | PRN
  Filled 2022-05-26: qty 28.4, fill #0

## 2022-05-26 MED ORDER — HYDROXYZINE PAMOATE 25 MG PO CAPS
25.0000 mg | ORAL_CAPSULE | Freq: Three times a day (TID) | ORAL | 1 refills | Status: DC | PRN
  Filled 2022-05-26: qty 60, 20d supply, fill #0

## 2022-05-26 MED ORDER — HYDROXYZINE PAMOATE 25 MG PO CAPS
25.0000 mg | ORAL_CAPSULE | Freq: Three times a day (TID) | ORAL | 1 refills | Status: DC | PRN
  Filled 2022-05-26: qty 30, 10d supply, fill #0

## 2022-05-26 MED ORDER — HYDROCORTISONE ACETATE 25 MG RE SUPP
25.0000 mg | Freq: Two times a day (BID) | RECTAL | 1 refills | Status: DC
  Filled 2022-05-26: qty 12, 6d supply, fill #0
  Filled 2022-08-06: qty 12, 6d supply, fill #1

## 2022-05-26 NOTE — Patient Instructions (Addendum)
Colonoscopa en los adultos Colonoscopy, Adult [place] colonoscopa es un procedimiento que se realiza para examinar todo el intestino grueso. Este procedimiento se Ass[employer]ant de un tubo [place], fino y flexible que tiene una cmara en el extremo. Es posible que necesite una colonoscopa: Como parte de las pruebas normales de Airline pilot de Building services engineer. Si tiene ciertos sntomas como: Recuento bajo de glbulos rojos en la sangre (anemia). Diarrea que no se resuelve. Dolor en el abdomen. Sangre en las heces. Una colonoscopa puede ayudar a Engineer, manufacturing y [employer] siguientes problemas mdicos, entre otros: Un crecimiento anormal de clulas o tejido (tumor). Crecimientos anormales dentro del revestimiento del intestino (plipos). Inflamacin. Zonas de hemorragias. Informe al mdico sobre: Cualquier alergia que tenga. Todos los [employer] [place], incluidos vitaminas, hierbas, gotas oftlmicas, cremas y [address] de [address]. Problemas previos que usted o algn miembro de su familia hayan tenido con los anestsicos. Cualquier problema de la sangre que tenga. Cirugas a las que se haya sometido. Cualquier afeccin mdica que tenga. Cualquier problema que haya tenido con sus deposiciones. Si est embarazada o podra estarlo. Cules son los riesgos? En general, se trata de un procedimiento seguro. Sin embargo, pueden ocurrir complicaciones, por ejemplo: Sangrado. Dao intestinal. Reacciones alrgicas a los medicamentos administrados durante el procedimiento. Infeccin. Esto es poco frecuente. Qu ocurre antes del procedimiento? Restricciones en las comidas y bebidas Siga las instrucciones del mdico respecto de las restricciones para las comidas o las bebidas, las cuales pueden incluir lo siguiente: Unos das antes del procedimiento: Normajean Baxter dieta con bajo contenido de [place]. Evite los frutos secos, las semillas, las frutas pasas, las frutas crudas y las  verduras. Entre 1 y 3 das antes del procedimiento: Coma solo postre de gelatina o helados de [place]. Beba solamente lquidos claros, como agua, jugo claro, caldo o sopa clara, caf negro o t, refrescos o bebidas deportivas. Evite los lquidos que contengan colorantes rojos o morados. El da del procedimiento: No coma alimentos slidos. Puede continuar bebiendo lquidos claros hasta 2 horas antes del procedimiento. No coma ni beba nada a partir de 2 horas antes del procedimiento o durante el lapso de tiempo que el [employer] recomiende. Preparado intestinal Si le recetaron un preparado intestinal para tomar por la boca (por va oral) para limpiar el colon: Tmelo como se lo haya indicado el mdico. Desde el da anterior al procedimiento, tendr que beber una gran cantidad de un medicamento lquido. El lquido har que tenga muchas deposiciones con heces blandas [employer] las heces se tornen casi claras o de color verde claro. Si la piel o la abertura entre las nalgas (ano) se irritan debido a la diarrea, Insurance account manager la irritacin utilizando: Toallitas que contengan medicamentos, por ejemplo toallitas hmedas para adultos con aloe y vitamina E. Un producto para suavizar la piel, como vaselina. Si vomita mientras toma el preparado intestinal: Haga una pausa durante 60 minutos como mximo. Comience a tomar el preparado nuevamente. Llame al mdico si contina vomitando o no puede tomar el preparado sin vomitar. Para limpiarle el colon, tambin pueden darle: Medicamentos laxantes. Estos ayudan a Art therapist. Instrucciones para el uso de un enema. Un enema es un medicamento lquido que se inyecta en el recto. Medicamentos Consulte al mdico sobre: Multimedia programmer o suspender sus medicamentos o suplementos. Esto es muy importante si toma suplementos de hierro, medicamentos para la diabetes o anticoagulantes. Tomar medicamentos como aspirina e ibuprofeno. Estos medicamentos pueden tener un efecto  anticoagulante en la [place].  No tome estos medicamentos a menos que el mdico se lo indique. Usar medicamentos de venta libre, vitaminas, hierbas y suplementos. Indicaciones generales Pregntele al mdico qu medidas se tomarn para ayudar a prevenir una infeccin. Estas pueden incluir lavar la piel con un jabn antisptico. Si va a marcharse a su casa inmediatamente despus del procedimiento, pdale a un adulto responsable que: Lo lleve a su casa desde el hospital o la clnica. No se le permitir conducir. Lo cuide durante el [employer] indiquen. Qu ocurre durante el procedimiento?  Le colocarn una va intravenosa (i.v.) en una vena. Le administrarn un medicamento para hacerlo dormir (anestesia general). Se recostar de costado con las rodillas flexionadas. Se colocar un lubricante sobre el tubo. Luego, se har lo siguiente con el tubo: Se introducir en el ano. Se mover suavemente por todas las partes del intestino grueso. Le aplicarn aire en el colon para mantenerlo abierto. Esto puede causar algo de presin o calambres. Con una cmara, se tomarn imgenes que aparecern en la pantalla. Podrn extraerle Carlynn Herald de tejido para analizarla con un microscopio (biopsia). El tejido se podr enviar a un laboratorio para ser analizado si se detectan signos de problemas. Si se encuentran pequeos plipos, pueden extirparse y analizarse para detectar clulas cancerosas. Cuando el procedimiento haya finalizado, se retirar el tubo. El procedimiento puede variar segn el mdico y el hospital. [place] ocurre despus del procedimiento? Le controlarn la presin arterial, la frecuencia cardaca, la frecuencia respiratoria y Air cabin crew de oxgeno en la sangre hasta que le den el alta del hospital o la clnica. Es posible que encuentre una pequea cantidad de sangre en la materia fecal. Es posible que tenga gases y clicos leves o distensin en el abdomen. Esto se produce a causa del aire  que se [place] para abrir el colon durante el procedimiento. Si le administraron un sedante durante el procedimiento, puede afectarlo por varias horas. No conduzca ni opere maquinaria hasta que el mdico le indique que es seguro Justice. Es su responsabilidad retirar [employer] del procedimiento. Pregntele a su mdico, o a un miembro del personal del departamento donde se realice el procedimiento, cundo estarn [employer]. Resumen [place] colonoscopa es un procedimiento que se realiza para examinar todo el intestino grueso. Siga las instrucciones del mdico con respecto a lo que puede comer y beber antes del procedimiento. Si le recetaron un preparado intestinal por va oral para limpiar el colon, tmelo como se lo haya indicado el mdico. Durante la colonoscopa, un tubo flexible con una cmara en su extremo se introduce en el ano y luego se pasa por todas las partes del intestino grueso. Esta informacin no tiene Theme park manager el consejo del mdico. Asegrese de hacerle al mdico cualquier pregunta que tenga. Document Revised: 01/07/2021 Document Reviewed: 01/07/2021 Elsevier Patient Education  2023 Elsevier Inc. Hemorroides Hemorrhoids Las hemorroides son venas inflamadas que pueden formarse: En el ano (recto). Estas se denominan hemorroides internas. Alrededor de la abertura del ano. Estas se denominan hemorroides externas. La mayora de las hemorroides no causan problemas muy graves. Generalmente mejoran con cambios en el estilo de vida y en lo que se come. Cules son las causas? Tener dificultad para defecar (estreimiento) o heces acuosas (diarrea). Hacer demasiada fuerza al defecar. Embarazo. Tener mucho sobrepeso (obesidad). Permanecer sentado [employer]. Andar en bicicleta por un largo tiempo. Levantar objetos pesados u otras cosas que requieren [employer]. Sexo anal. Cules son los signos o sntomas? Dolor. Picazn  o irritacin en el  ano. Sangrado proveniente del ano. Prdida de materia fecal. Hinchazn. Uno o ms bultos alrededor de la abertura del ano. Cmo se trata? En la [employer], las hemorroides pueden tratarse en casa. Es posible que le indiquen lo siguiente: Multimedia programmer lo que come. Hacer cambios en su estilo de vida. Si estos tratamientos no resultan eficaces, tal vez deba someterse a un procedimiento. Es posible que el mdico deba hacer lo siguiente: Scientific laboratory technician bandas de goma en la parte inferior de las hemorroides para hacer que se desprendan. Poner un medicamento dentro de las hemorroides para reducir Brewing technologist. Dirigir un tipo de Sports administrator las hemorroides para hacer que se desprendan. Realizar una ciruga para retirar las hemorroides. Siga estas instrucciones en su casa: Medicamentos Use los medicamentos de venta libre y los recetados solamente como se lo haya indicado el mdico. Use cremas medicadas o medicamentos que se ponen en el ano como se lo haya indicado el mdico. Comida y bebida  Consuma alimentos con alto contenido de [place]. Entre ellos cereales integrales, frijoles, frutos secos, frutas y verduras. Pregntele a su mdico acerca de tomar productos con fibra aadida (suplementos de [place]). Consuma menos grasa. Para esto, puede hacer lo siguiente: Coma productos lcteos descremados. Coma menos carne roja. Evite los alimentos procesados. Beber suficiente lquido para Radio producer pis (orina) de color amarillo plido. Control del dolor y la hinchazn  Tome un bao de agua tibia (bao de asiento) durante 20 minutos para Engineer, materials. Hgalo 3 o 4 veces al da. Puede hacerlo en una baera. Tambin puede usar un bao de asiento porttil que se pone sobre el inodoro. Si se lo indican, aplique hielo sobre la zona dolorida. Puede ser de ayuda aplicarse hielo [employer] baos con agua tibia. Ponga el hielo en una bolsa plstica. Coloque una toalla entre la piel y Copy. Aplique el hielo  durante 20 minutos, 2 a 3 veces por da. Si la piel se le pone de color rojo brillante, quite el hielo de inmediato para evitar daos en la piel. El [place] de dao es mayor si no puede sentir dolor, Airline pilot o fro. Instrucciones generales Actividad fsica. Consulte al mdico qu tipos de ejercicios son mejores para usted y qu cantidad. Vaya al bao cuando sienta la necesidad de defecar. No espere. Trate de no hacer mucha fuerza al defecar. Mantenga el ano seco y limpio. Use papel higinico hmedo o toallitas humedecidas despus de defecar. No pase mucho tiempo sentado en el inodoro. Comunquese con un mdico si: Tiene dolor e hinchazn que no mejoran con el tratamiento. Tiene dificultad para defecar. No puede defecar. Tiene dolor o hinchazn en la zona exterior de las hemorroides. Solicite ayuda de inmediato si: Tiene un sangrado del ao que no se detiene. Esta informacin no tiene Theme park manager el consejo del mdico. Asegrese de hacerle al mdico cualquier pregunta que tenga. Document Revised: 10/22/2021 Document Reviewed: 10/22/2021 Elsevier Patient Education  2023 [employer].

## 2022-05-26 NOTE — Progress Notes (Signed)
Patient ID: Gabriel Ball, male   DOB: Oct 16, 1970, 52 y.o.   MRN: [id number]   Gabriel Ball, is a 52 y.o. male  [id number]  [id number]  DOB - 09-29-1970  Chief Complaint  Patient presents with   Medication Refill       Subjective:   Gabriel Ball is a 52 y.o. male here today for hemorrhoids.  He has been constipated and having some hemorrhoid pain and pain with BM.  He has tried mupiricin without relief.  No melena or hematochezia.  He has not had colonoscopy.    He is also requesting diazepam for anxiety.  He says he only feels anxious sometimes and is not interested in a daily medication.  PHQ9 and GAD7=0.    No problems updated.  ALLERGIES: Allergies  Allergen Reactions   Cyclobenzaprine     Facial Numbness/dizziness/hypersomnolence   Ibuprofen Hives   Methocarbamol     Abdominal pain   Amoxicillin Itching    Pruritic rash   Penicillins Palpitations    Has patient had a PCN reaction causing immediate rash, facial/tongue/throat swelling, SOB or lightheadedness with hypotension: No Has patient had a PCN reaction causing severe rash involving mucus membranes or skin necrosis: No Has patient had a PCN reaction that required hospitalization: No Has patient had a PCN reaction occurring within the last 10 years: No If all of the above answers are "NO", then may proceed with Cephalosporin use.    PAST MEDICAL HISTORY: Past Medical History:  Diagnosis Date   Asthma    Herpes    Shingles     MEDICATIONS AT HOME: Prior to Admission medications   Medication Sig Start Date End Date Taking? Authorizing Provider  hydrocortisone (ANUSOL-HC) 25 MG suppository Place 1 suppository (25 mg total) rectally 2 (two) times daily. 05/26/22  Yes Georgian Co M, PA-C  Hydrocortisone Acetate 1 % OINT Apply 1 each topically as needed. 05/26/22  Yes Anders Simmonds, PA-C  hydrOXYzine (VISTARIL) 25 MG capsule Take 1 capsule (25 mg total) by mouth every 8 (eight) hours  as needed. For anxiety 05/26/22  Yes Aquila Delaughter M, PA-C  cetirizine (ZYRTEC ALLERGY) 10 MG tablet Take 1 tablet (10 mg total) by mouth daily. Una vez al dia Patient not taking: Reported on 05/26/2022 01/03/22   Rising, Lurena Joiner, PA-C  clindamycin (CLEOCIN) 300 MG capsule Take 1 capsule (300 mg total) by mouth in the morning and at bedtime. Patient not taking: Reported on 05/26/2022 02/08/22   Debby Freiberg, NP  famotidine (PEPCID) 20 MG tablet Take 1 tablet (20 mg total) by mouth 2 (two) times daily. Una vez por la noche Patient not taking: Reported on 05/26/2022 01/18/22   Rondel Baton, MD  fluticasone [facility]) 50 MCG/ACT nasal spray Place 2 sprays into both nostrils daily. Patient not taking: Reported on 05/26/2022 12/09/21   Claiborne Rigg, NP  nystatin-triamcinolone ointment [facility]) Apply 1 Application topically 2 (two) times daily. Patient not taking: Reported on 05/26/2022    [provider]  ondansetron (ZOFRAN-ODT) 4 MG disintegrating tablet Take 1 tablet (4 mg total) by mouth every 8 (eight) hours as needed for nausea or vomiting. Patient not taking: Reported on 05/26/2022 02/16/22   Carlisle Beers, FNP  tiZANidine (ZANAFLEX) 4 MG tablet Take 1 tablet (4 mg total) by mouth every 8 (eight) hours as needed for muscle spasms. Patient not taking: Reported on 05/26/2022 01/17/22   Zenia Resides, MD  valACYclovir (VALTREX) 1000 MG tablet Take 1  tablet (1,000 mg total) by mouth 2 (two) times daily. Patient not taking: Reported on 05/26/2022 02/17/22   Raspet, Noberto Retort, PA-C  SUMAtriptan (IMITREX) 50 MG tablet Take one for headache.  May repeat in 2 hours.  No more than 2 a day 05/27/17 12/09/21  Eustace Moore, MD    ROS: Neg HEENT Neg resp Neg cardiac Neg GI Neg GU Neg MS Neg psych Neg neuro  Objective:   Vitals:   05/26/22 1434  BP: 106/68  Pulse: (!) 59  SpO2: 97%  Weight: 186 lb 9.6 oz (84.6 kg)   Exam General appearance : Awake, alert, not in any  distress. Speech Clear. Not toxic looking HEENT: Atraumatic and Normocephalic Neck: Supple, no JVD. No cervical lymphadenopathy.  Chest: Good air entry bilaterally, CTAB.  No rales/rhonchi/wheezing CVS: S1 S2 regular, no murmurs.  Extremities: B/L Lower Ext shows no edema, both legs are warm to touch Neurology: Awake alert, and oriented X 3, CN II-XII intact, Non focal Skin: No Rash  Data Review Lab Results  Component Value Date   HGBA1C 5.4 09/11/2021   HGBA1C 5.3 11/19/2019   HGBA1C 5.1 01/25/2018    Assessment & Plan   1. Hemorrhoids, unspecified hemorrhoid type - hydrocortisone (ANUSOL-HC) 25 MG suppository; Place 1 suppository (25 mg total) rectally 2 (two) times daily.  Dispense: 12 suppository; Refill: 1 - Ambulatory referral to Gastroenterology - Hydrocortisone Acetate 1 % OINT; Apply 1 each topically as needed.  Dispense: 28.4 g; Refill: 2  2. Anxiety - hydrOXYzine (VISTARIL) 25 MG capsule; Take 1 capsule (25 mg total) by mouth every 8 (eight) hours as needed. For anxiety  Dispense: 60 capsule; Refill: 1  3. Language barrier [employer] interpreters  "Shari Prows" used and additional time performing visit was required.   4. Screening for colon cancer - Ambulatory referral to Gastroenterology    Return if symptoms worsen or fail to improve.  The patient was given clear instructions to go to ER or return to medical center if symptoms don't improve, worsen or new problems develop. The patient verbalized understanding. The patient was told to call to get lab results if they haven't heard anything in the next week.      Georgian Co, PA-C [facility] [place], [place] [phone]   05/26/2022, 3:02 PM

## 2022-05-28 ENCOUNTER — Other Ambulatory Visit: Payer: Self-pay

## 2022-05-28 ENCOUNTER — Emergency Department (HOSPITAL_COMMUNITY): Payer: Self-pay

## 2022-05-28 ENCOUNTER — Emergency Department (HOSPITAL_COMMUNITY)
Admission: EM | Admit: 2022-05-28 | Discharge: 2022-05-28 | Disposition: A | Discharge status: Home / Self Care | Payer: Self-pay | Attending: Emergency Medicine | Admitting: Emergency Medicine

## 2022-05-28 DIAGNOSIS — K449 Diaphragmatic hernia without obstruction or gangrene: Secondary | ICD-10-CM | POA: Insufficient documentation

## 2022-05-28 DIAGNOSIS — K429 Umbilical hernia without obstruction or gangrene: Secondary | ICD-10-CM | POA: Insufficient documentation

## 2022-05-28 DIAGNOSIS — J45909 Unspecified asthma, uncomplicated: Secondary | ICD-10-CM | POA: Insufficient documentation

## 2022-05-28 DIAGNOSIS — Z7951 Long term (current) use of inhaled steroids: Secondary | ICD-10-CM | POA: Insufficient documentation

## 2022-05-28 DIAGNOSIS — K649 Unspecified hemorrhoids: Secondary | ICD-10-CM | POA: Insufficient documentation

## 2022-05-28 LAB — URINALYSIS, ROUTINE W REFLEX MICROSCOPIC
Bilirubin Urine: NEGATIVE
Glucose, UA: NEGATIVE mg/dL
Hgb urine dipstick: NEGATIVE
Ketones, ur: NEGATIVE mg/dL
Leukocytes,Ua: NEGATIVE
Nitrite: NEGATIVE
Protein, ur: NEGATIVE mg/dL
Specific Gravity, Urine: 1.026 (ref 1.005–1.030)
pH: 5 (ref 5.0–8.0)

## 2022-05-28 LAB — COMPREHENSIVE METABOLIC PANEL
ALT: 29 U/L (ref 0–44)
AST: 25 U/L (ref 15–41)
Albumin: 4.2 g/dL (ref 3.5–5.0)
Alkaline Phosphatase: 68 U/L (ref 38–126)
Anion gap: 9 (ref 5–15)
BUN: 14 mg/dL (ref 6–20)
CO2: 21 mmol/L — ABNORMAL LOW (ref 22–32)
Calcium: 8.6 mg/dL — ABNORMAL LOW (ref 8.9–10.3)
Chloride: 110 mmol/L (ref 98–111)
Creatinine, Ser: 0.74 mg/dL (ref 0.61–1.24)
GFR, Estimated: >60 mL/min (ref >60)
Glucose, Bld: 94 mg/dL (ref 70–99)
Potassium: 3.5 mmol/L (ref 3.5–5.1)
Sodium: 140 mmol/L (ref 135–145)
Total Bilirubin: 0.5 mg/dL (ref 0.3–1.2)
Total Protein: 7.1 g/dL (ref 6.5–8.1)

## 2022-05-28 LAB — CBC WITH DIFFERENTIAL/PLATELET
Abs Immature Granulocytes: 0.03 10*3/uL (ref 0.00–0.07)
Basophils Absolute: 0 10*3/uL (ref 0.0–0.1)
Basophils Relative: 0 %
Eosinophils Absolute: 0.2 10*3/uL (ref 0.0–0.5)
Eosinophils Relative: 3 %
HCT: 44.1 % (ref 39.0–52.0)
Hemoglobin: 15 g/dL (ref 13.0–17.0)
Immature Granulocytes: 0 %
Lymphocytes Relative: 28 %
Lymphs Abs: 2 10*3/uL (ref 0.7–4.0)
MCH: 29 pg (ref 26.0–34.0)
MCHC: 34 g/dL (ref 30.0–36.0)
MCV: 85.3 fL (ref 80.0–100.0)
Monocytes Absolute: 0.5 10*3/uL (ref 0.1–1.0)
Monocytes Relative: 7 %
Neutro Abs: 4.3 10*3/uL (ref 1.7–7.7)
Neutrophils Relative %: 62 %
Platelets: 226 10*3/uL (ref 150–400)
RBC: 5.17 MIL/uL (ref 4.22–5.81)
RDW: 12.9 % (ref 11.5–15.5)
WBC: 7 10*3/uL (ref 4.0–10.5)
nRBC: 0 % (ref 0.0–0.2)

## 2022-05-28 LAB — POC OCCULT BLOOD, ED: Fecal Occult Bld: NEGATIVE

## 2022-05-28 LAB — LIPASE, BLOOD: Lipase: 41 U/L (ref 11–51)

## 2022-05-28 MED ORDER — MORPHINE SULFATE (PF) 4 MG/ML IV SOLN
4.0000 mg | Freq: Once | INTRAVENOUS | Status: AC
  Administered 2022-05-28: 4 mg via INTRAVENOUS
  Filled 2022-05-28: qty 1

## 2022-05-28 MED ORDER — SODIUM CHLORIDE 0.9 % IV BOLUS
1000.0000 mL | Freq: Once | INTRAVENOUS | Status: AC
  Administered 2022-05-28: 1000 mL via INTRAVENOUS

## 2022-05-28 MED ORDER — ACETAMINOPHEN 325 MG PO TABS
650.0000 mg | ORAL_TABLET | Freq: Once | ORAL | Status: AC
  Administered 2022-05-28: 650 mg via ORAL
  Filled 2022-05-28: qty 2

## 2022-05-28 MED ORDER — ONDANSETRON HCL 4 MG/2ML IJ SOLN
4.0000 mg | Freq: Once | INTRAMUSCULAR | Status: AC
  Administered 2022-05-28: 4 mg via INTRAVENOUS
  Filled 2022-05-28: qty 2

## 2022-05-28 NOTE — Discharge Instructions (Addendum)
Evaluation was overall reassuring, CT scan was negative for kidney stone but did reveal a hiatal hernia and nonobstructing umbilical hernia.  Recommend you follow-up with your PCP for these findings.  If you start develop with abdominal pain, nausea vomiting diarrhea, fever or any other concerning symptom please return emergency department further evaluation.

## 2022-05-28 NOTE — ED Provider Notes (Signed)
EMERGENCY DEPARTMENT AT [facility] Provider Note   CSN: [id number] Arrival date & time: 05/28/22  1657     History  Chief Complaint  Patient presents with   Hemorrhoids   Flank Pain   [name] is a 52 y.o. male with history of asthma presenting for flank pain and hemorrhoids.  Flank pain started this morning it is in the right flank.  Denies urinary changes, fever or chills.  Denies nausea vomiting diarrhea and any other abdominal pain.  Also states he has "chronic hemorrhoids" states he has had pain with defecation.  Denies any blood in the stool.  Not taking any medications to treat for hemorrhoids.   Flank Pain       Home Medications Prior to Admission medications   Medication Sig Start Date End Date Taking? Authorizing Provider  cetirizine (ZYRTEC ALLERGY) 10 MG tablet Take 1 tablet (10 mg total) by mouth daily. Una vez al dia Patient not taking: Reported on 05/26/2022 01/03/22   Rising, Lurena Joiner, PA-C  clindamycin (CLEOCIN) 300 MG capsule Take 1 capsule (300 mg total) by mouth in the morning and at bedtime. Patient not taking: Reported on 05/26/2022 02/08/22   Debby Freiberg, NP  famotidine (PEPCID) 20 MG tablet Take 1 tablet (20 mg total) by mouth 2 (two) times daily. Una vez por la noche Patient not taking: Reported on 05/26/2022 01/18/22   Rondel Baton, MD  fluticasone  [facility]) 50 MCG/ACT nasal spray Place 2 sprays into both nostrils daily. Patient not taking: Reported on 05/26/2022 12/09/21   Claiborne Rigg, NP  hydrocortisone (ANUSOL-HC) 25 MG suppository Place 1 suppository (25 mg total) rectally 2 (two) times daily. 05/26/22   Anders Simmonds, PA-C  Hydrocortisone Acetate 1 % OINT Apply 1 each topically as needed. 05/26/22   Anders Simmonds, PA-C  hydrOXYzine (VISTARIL) 25 MG capsule Take 1 capsule (25 mg total) by mouth every 8 (eight) hours as needed. For anxiety 05/26/22   Anders Simmonds, PA-C  nystatin-triamcinolone  ointment [facility]) Apply 1 Application topically 2 (two) times daily. Patient not taking: Reported on 05/26/2022    [provider]  ondansetron (ZOFRAN-ODT) 4 MG disintegrating tablet Take 1 tablet (4 mg total) by mouth every 8 (eight) hours as needed for nausea or vomiting. Patient not taking: Reported on 05/26/2022 02/16/22   Carlisle Beers, FNP  tiZANidine (ZANAFLEX) 4 MG tablet Take 1 tablet (4 mg total) by mouth every 8 (eight) hours as needed for muscle spasms. Patient not taking: Reported on 05/26/2022 01/17/22   Zenia Resides, MD  valACYclovir (VALTREX) 1000 MG tablet Take 1 tablet (1,000 mg total) by mouth 2 (two) times daily. Patient not taking: Reported on 05/26/2022 02/17/22   Raspet, Noberto Retort, PA-C  SUMAtriptan (IMITREX) 50 MG tablet Take one for headache.  May repeat in 2 hours.  No more than 2 a day 05/27/17 12/09/21  Eustace Moore, MD      Allergies    Cyclobenzaprine, Ibuprofen, Methocarbamol, Amoxicillin, and Penicillins    Review of Systems   Review of Systems  Genitourinary:  Positive for flank pain.    Physical Exam   Vitals:   05/28/22 1830 05/28/22 1845  BP: 120/77 118/69  Pulse: 64 76  Resp: 16 16  Temp:    SpO2: 99% 100%    CONSTITUTIONAL:  well-appearing, NAD NEURO:  Alert and oriented x 3, CN 3-12 grossly intact EYES:  eyes equal and reactive ENT/NECK:  Supple, no stridor  CARDIO:  regular rate and rhythm, appears well-perfused  PULM:  No respiratory distress, CTAB GI/GU:  non-distended, soft, right CVA tenderness otherwise nontender Rectal: No external fissures, hemorrhoids or masses noted.  Internally no palpable masses, prostate is smooth and symmetric, no obvious bleeding MSK/SPINE:  No gross deformities, no edema, moves all extremities  SKIN:  no rash, atraumatic  *Additional and/or pertinent findings included in MDM below  ED Results / Procedures / Treatments   Labs (all labs ordered are listed, but only abnormal results are  displayed) Labs Reviewed  COMPREHENSIVE METABOLIC PANEL - Abnormal; Notable for the following components:      Result Value   CO2 21 (*)    Calcium 8.6 (*)    All other components within normal limits  URINALYSIS, ROUTINE W REFLEX MICROSCOPIC  CBC WITH DIFFERENTIAL/PLATELET  LIPASE, BLOOD  POC OCCULT BLOOD, ED    EKG None  Radiology CT RENAL STONE STUDY  Result Date: 05/28/2022 CLINICAL DATA:  Abdominal/flank pain, stone suspected. EXAM: CT ABDOMEN AND PELVIS WITHOUT CONTRAST TECHNIQUE: Multidetector CT imaging of the abdomen and pelvis was performed following the standard protocol without IV contrast. RADIATION DOSE REDUCTION: This exam was performed according to the departmental dose-optimization program which includes automated exposure control, adjustment of the mA and/or kV according to patient size and/or use of iterative reconstruction technique. COMPARISON:  01/23/2022. FINDINGS: Lower chest: Mild atelectasis is present at the lung bases. Hepatobiliary: No focal liver abnormality is seen. No gallstones, gallbladder wall thickening, or biliary dilatation. Pancreas: Unremarkable. No pancreatic ductal dilatation or surrounding inflammatory changes. Spleen: Normal in size without focal abnormality. Adrenals/Urinary Tract: The adrenal glands are within normal limits. No renal calculus or obstructive uropathy bilaterally. The bladder is unremarkable. Stomach/Bowel: There is a small hiatal hernia. Stomach is within normal limits. Appendix appears normal. No evidence of bowel wall thickening, distention, or inflammatory changes. No free air or pneumatosis. A few scattered diverticula are present along the colon without evidence of diverticulitis. There is a broad-based umbilical hernia containing a loop of nonobstructed bowel. Vascular/Lymphatic: Aortic atherosclerosis. No enlarged abdominal or pelvic lymph nodes. Reproductive: Prostate gland is mildly enlarged Other: . no abdominopelvic ascites.  Small fat containing inguinal hernias are present bilaterally. Musculoskeletal: Mild degenerative changes are present in the thoracolumbar spine. No acute osseous abnormality. IMPRESSION: 1. No acute intra-abdominal process. 2. No renal calculus or obstructive uropathy bilaterally. 3. Small hiatal hernia. 4. Broad-based umbilical hernia containing nonobstructed small bowel. 5. Aortic atherosclerosis. Electronically Signed   By: Thornell Sartorius M.D.   On: 05/28/2022 20:08    Procedures Procedures    Medications Ordered in ED Medications  morphine (PF) 4 MG/ML injection 4 mg (4 mg Intravenous Given 05/28/22 1823)  ondansetron (ZOFRAN) injection 4 mg (4 mg Intravenous Given 05/28/22 1823)  sodium chloride 0.9 % bolus 1,000 mL (1,000 mLs Intravenous New Bag/Given 05/28/22 1823)    ED Course/ Medical Decision Making/ A&P                             Medical Decision Making Amount and/or Complexity of Data Reviewed Labs: ordered. Radiology: ordered.  Risk Prescription drug management.   Initial Impression and Ddx 52 year old well-appearing male presenting for flank pain and concern for hemorrhoids.  Exam notable for right CVA tenderness.  DDx includes nephrolithiasis, pyelonephritis, UTI, other intra-abdominal infection, hemorrhoids, and GI bleed. Patient PMH that increases complexity of ED encounter:  none  Interpretation of Diagnostics I independent reviewed and interpreted the labs as followed: No acute derangement  - I independently visualized the following imaging with scope of interpretation limited to determining acute life threatening conditions related to emergency care: CT, which revealed small hiatal hernia and nonobstructive umbilical hernia  Patient Reassessment and Ultimate Disposition/Management Overall patient appears clinically well.  She suspicion is kidney stone.  CT was negative.  Did reveal small hiatal hernia and umbilical hernia.  Given his clinical presentation, feel  these findings are incidental.  Advised follow-up with PCP.  Vital stable discharge.  Patient for hemorrhoids given unremarkable rectal exam.  Patient management required discussion with the following services or consulting groups:  None  Complexity of Problems Addressed Acute complicated illness or Injury  Additional Data Reviewed and Analyzed Further history obtained from: Past medical history and medications listed in the EMR  Patient Encounter Risk Assessment None         Final Clinical Impression(s) / ED Diagnoses Final diagnoses:  Umbilical hernia without obstruction and without gangrene  Hiatal hernia    Rx / DC Orders ED Discharge Orders     None         Gareth Eagle, PA-C 05/28/22 2030    Lorre Nick, MD 06/01/22 [phone]

## 2022-05-28 NOTE — ED Triage Notes (Signed)
Pt arrived via POV. Pt c/o R flank pain beginning this AM and chronic hemorrhoids.   Aox4

## 2022-05-28 NOTE — ED Notes (Signed)
Interpretor [phone] used while assessing pt

## 2022-05-31 ENCOUNTER — Ambulatory Visit (HOSPITAL_COMMUNITY)
Admission: EM | Admit: 2022-05-31 | Discharge: 2022-05-31 | Disposition: A | Discharge status: Home / Self Care | Payer: Self-pay | Attending: Physician Assistant | Admitting: Physician Assistant

## 2022-05-31 ENCOUNTER — Other Ambulatory Visit: Payer: Self-pay

## 2022-05-31 ENCOUNTER — Encounter (HOSPITAL_COMMUNITY): Payer: Self-pay | Admitting: *Deleted

## 2022-05-31 DIAGNOSIS — H1013 Acute atopic conjunctivitis, bilateral: Secondary | ICD-10-CM

## 2022-05-31 DIAGNOSIS — M545 Low back pain, unspecified: Secondary | ICD-10-CM

## 2022-05-31 DIAGNOSIS — K429 Umbilical hernia without obstruction or gangrene: Secondary | ICD-10-CM

## 2022-05-31 MED ORDER — TIZANIDINE HCL 4 MG PO TABS
4.0000 mg | ORAL_TABLET | Freq: Three times a day (TID) | ORAL | 0 refills | Status: DC | PRN
  Filled 2022-05-31: qty 15, 5d supply, fill #0

## 2022-05-31 MED ORDER — LIDOCAINE 5 % EX PTCH
1.0000 | MEDICATED_PATCH | CUTANEOUS | 0 refills | Status: DC
  Filled 2022-05-31: qty 30, 30d supply, fill #0

## 2022-05-31 MED ORDER — OLOPATADINE HCL 0.1 % OP SOLN
1.0000 [drp] | Freq: Two times a day (BID) | OPHTHALMIC | 0 refills | Status: DC
  Filled 2022-05-31: qty 5, 25d supply, fill #0

## 2022-05-31 NOTE — Discharge Instructions (Signed)
Encontraron una hernia en su tomografa computarizada, pero no puedo sentirla en el examen. Haga un seguimiento con su atencin primaria como lo comentamos. Si tiene Radiographer, therapeutic intenso o una lesin grande en el [place], [place].  Utilice gotas de [place] para la irritacin de los ojos. Tambin puede utilizar gotas lubricantes para los ojos de venta libre para Paramedic an ms los sntomas.  Tome Zanaflex hasta 3 veces al da. Esto le dar sueo, as que no conduzca ni beba alcohol mientras lo toma. Utilice parches de lidocana para aliviar an ms los sntomas. selo sobre la piel durante 12 horas y luego retrelo durante 12 horas; use solo 1 parche cada 24 horas. Si tiene algn sntoma que empeora o [place], regrese para una reevaluacin.

## 2022-05-31 NOTE — ED Provider Notes (Signed)
[facility]    CSN: [id number] Arrival date & time: 05/31/22  0809      History   Chief Complaint Chief Complaint  Patient presents with   Back Pain    [name] is a 52 y.o. male.   Patient presents today with several concerns.  Patient is Spanish-speaking but interpreter was utilized for visit.  His primary concern today is several day history of back pain.  He reports that he fell many years ago and has had ongoing intermittent back pain since that time.  Reports that pain is rated 7 on a 0-10 pain scale, localized to his midline lower back, described as aching, no aggravating relieving factors identified.  He has not tried any over-the-counter medication for symptom management.  Does report that he has a herniated disc in this area and has seen a chiropractor but has not seen a specialist.  He denies any associated bowel/bladder continence, lower extremity weakness, saddle anesthesia.  He is having difficulty with daily activities as result of symptoms.  Denies any known injury increase activity prior to symptom onset.  Denies previous spinal surgery.  Denies history of malignancy.  In addition, patient was seen in the emergency room on 05/28/2022.  At that time CT scan showed incidental umbilical hernia.  He denies any known bulge or abdominal pain.  He has not eaten in several days as he has been concerned that this could be something serious.  Denies previous abdominal surgery.  He has never been evaluated for this hernia in the past.  He also has questions regarding her hemorrhoids.  Reports that he was previously told that he has hemorrhoids but when he went to the emergency room on 05/28/2022 rectal exam did not indicate he had hemorrhoids.  He has been previously prescribed medication and completed this.  He reports that he has had painful bowel movements in the past but not recently.  Denies any current painful bowel movements, rectal pain, hematochezia,  hematemesis.  We did discuss that hemorrhoids can improve with medication.  Encouraged him to return for reevaluation with any recurrent rectal pain, painful bowel movements, blood in the stool for evaluation at that time since he is currently asymptomatic.  In addition, the end of visit patient reported that he has had several weeks of itchy, burning, tearful eye discomfort.  He denies any exposure to fine particulate matter, chemicals.  Does not wear contacts.  He has not been trying any over-the-counter medications.  Denies any associated visual disturbance other than when he has significant tearing.    Past Medical History:  Diagnosis Date   Asthma    Herpes    Shingles     Patient Active Problem List   Diagnosis Date Noted   Acute bilateral low back pain without sciatica 06/05/2020   Acute pain of right shoulder 06/05/2020   Asthma due to environmental allergies 12/20/2016   HSV (herpes simplex virus) infection 02/24/2015   Anxiety 04/02/2014    Past Surgical History:  Procedure Laterality Date   MOUTH SURGERY         Home Medications    Prior to Admission medications   Medication Sig Start Date End Date Taking? Authorizing Provider  lidocaine (LIDODERM) 5 % Place 1 patch onto the skin daily. Remove & Discard patch within 12 hours or as directed by MD 05/31/22  Yes Dayon Witt, Denny Peon K, PA-C  olopatadine (PATADAY) 0.1 % ophthalmic solution Place 1 drop into both eyes 2 (two) times  daily. 05/31/22  Yes Marjory Meints, Noberto Retort, PA-C  hydrocortisone (ANUSOL-HC) 25 MG suppository Place 1 suppository (25 mg total) rectally 2 (two) times daily. 05/26/22   Anders Simmonds, PA-C  Hydrocortisone Acetate 1 % OINT Apply 1 each topically as needed. 05/26/22   Anders Simmonds, PA-C  hydrOXYzine (VISTARIL) 25 MG capsule Take 1 capsule (25 mg total) by mouth every 8 (eight) hours as needed. For anxiety 05/26/22   Anders Simmonds, PA-C  tiZANidine (ZANAFLEX) 4 MG tablet Take 1 tablet (4 mg total) by mouth  every 8 (eight) hours as needed for muscle spasms. 05/31/22   Ukiah Trawick, Noberto Retort, PA-C  SUMAtriptan (IMITREX) 50 MG tablet Take one for headache.  May repeat in 2 hours.  No more than 2 a day 05/27/17 12/09/21  Eustace Moore, MD    Family History Family History  Problem Relation Age of Onset   Cancer Father     Social History Social History   Tobacco Use   Smoking status: Former   Smokeless tobacco: Never  Building services engineer Use: Never used  Substance Use Topics   Alcohol use: No   Drug use: No     Allergies   Cyclobenzaprine, Ibuprofen, Methocarbamol, Amoxicillin, and Penicillins   Review of Systems Review of Systems  Constitutional:  Positive for activity change. Negative for appetite change, fatigue and fever.  Eyes:  Positive for discharge and itching. Negative for photophobia, pain, redness and visual disturbance.  Respiratory:  Negative for cough and shortness of breath.   Cardiovascular:  Negative for chest pain.  Gastrointestinal:  Negative for abdominal pain, anal bleeding, blood in stool, constipation, diarrhea, nausea, rectal pain and vomiting.  Musculoskeletal:  Positive for back pain. Negative for arthralgias and myalgias.  Neurological:  Negative for dizziness, light-headedness and headaches.     Physical Exam Triage Vital Signs ED Triage Vitals  Enc Vitals Group     BP 05/31/22 0910 118/71     Pulse --      Resp 05/31/22 0910 18     Temp 05/31/22 0910 98.6 F (37 C)     Temp src --      SpO2 05/31/22 0910 97 %     Weight --      Height --      Head Circumference --      Peak Flow --      Pain Score 05/31/22 0908 4     Pain Loc --      Pain Edu? --      Excl. in GC? --    No data found.  Updated Vital Signs BP 118/71   Pulse 75   Temp 98.6 F (37 C)   Resp 18   SpO2 97%   Visual Acuity Right Eye Distance:   Left Eye Distance:   Bilateral Distance:    Right Eye Near:   Left Eye Near:    Bilateral Near:     Physical  Exam Vitals reviewed.  Constitutional:      General: He is awake.     Appearance: Normal appearance. He is well-developed. He is not ill-appearing.     Comments: Very pleasant male appears stated age no acute distress sitting comfortably in exam room  HENT:     Head: Normocephalic and atraumatic.  Eyes:     Extraocular Movements: Extraocular movements intact.     Conjunctiva/sclera: Conjunctivae normal.     Pupils: Pupils are equal, round, and reactive to light.  Cardiovascular:     Rate and Rhythm: Normal rate and regular rhythm.     Heart sounds: Normal heart sounds, S1 normal and S2 normal. No murmur heard. Pulmonary:     Effort: Pulmonary effort is normal.     Breath sounds: Normal breath sounds. No stridor. No wheezing, rhonchi or rales.     Comments: Clear to auscultation bilaterally Abdominal:     General: Bowel sounds are normal.     Palpations: Abdomen is soft.     Tenderness: There is no abdominal tenderness. There is no right CVA tenderness, left CVA tenderness, guarding or rebound.     Hernia: No hernia is present. There is no hernia in the umbilical area.     Comments: No noticeable hernia on exam  Musculoskeletal:     Cervical back: No spasms, tenderness or bony tenderness. No pain with movement.     Thoracic back: No tenderness or bony tenderness.     Lumbar back: Tenderness present. No spasms or bony tenderness. Negative right straight leg raise test and negative left straight leg raise test.     Comments: No pain percussion of vertebrae.  Tenderness palpation of midline and left lumbar paraspinal muscles.  No deformity or step-off noted.  No spasm noted.  Strength 5/5 bilateral lower extremities.  Negative straight leg raise and Faber.  Neurological:     Mental Status: He is alert.  Psychiatric:        Behavior: Behavior is cooperative.      UC Treatments / Results  Labs (all labs ordered are listed, but only abnormal results are displayed) Labs Reviewed - No  data to display  EKG   Radiology No results found.  Procedures Procedures (including critical care time)  Medications Ordered in UC Medications - No data to display  Initial Impression / Assessment and Plan / UC Course  I have reviewed the triage vital signs and the nursing notes.  Pertinent labs & imaging results that were available during my care of the patient were reviewed by me and considered in my medical decision making (see chart for details).     Patient is well-appearing, afebrile, nontoxic, nontachycardic.  Plain films were deferred as patient denies any recent trauma and had no focal bony tenderness.  Suspect myofascial pain as etiology of symptoms.  He was started on Zanaflex up to 3 times a day.  Discussed that this can be sedating and is not to drive or drink alcohol with taking it.  He was also given prescription for lidocaine patches.  He can use over-the-counter medication for additional symptom relief.  Recommended that he follow-up with primary care to determine if he needs more advanced imaging since we do not have these capabilities in urgent care.  Discussed that if he has any worsening or changing symptoms he needs to be seen immediately.  Had a long conversation with patient about the implications of umbilical hernia.  Discussed that this is not even obvious on exam and that often people live with this without any complications or concerns.  We did discuss signs/symptoms of regulation or incarcerated hernia that would warrant emergent evaluation.  Patient continued to express anxiety related to having a hernia and encouraged him to follow-up with his PCP who can refer him to a surgeon to determine if additional intervention is warranted.  Patient is currently not experiencing any rectal pain or discomfort.  We discussed that hemorrhoids can improve with medication.  Encouraged him to use fiber in his  diet to avoid constipation as this can cause irritation of  hemorrhoids.  Since he is currently asymptomatic exam was deferred and we discussed that if he has recurrent symptoms he should return for reevaluation.  Suspect allergic conjunctivitis as etiology of symptoms.  Patient was started on Pataday to help manage his symptoms.  Recommended that he use lubricating eyedrops for additional symptom relief.  If he has any worsening or changing symptoms he is to return for reevaluation.  Level 4 based on time: 35 minutes spent with patient discussing concerns and providing explanation of ER findings.   Final Clinical Impressions(s) / UC Diagnoses   Final diagnoses:  Acute midline low back pain without sciatica  Allergic conjunctivitis of both eyes  Umbilical hernia without obstruction and without gangrene     Discharge Instructions      Encontraron una hernia en su tomografa computarizada, pero no puedo sentirla en el examen. Haga un seguimiento con su atencin primaria como lo comentamos. Si tiene Radiographer, therapeutic intenso o una lesin grande en el [place], [place].  Utilice gotas de Pataday para la irritacin de los ojos. Tambin puede utilizar gotas lubricantes para los ojos de venta libre para Paramedic an ms los sntomas.  Tome Zanaflex hasta 3 veces al da. Esto le dar sueo, as que no conduzca ni beba alcohol mientras lo toma. Utilice parches de lidocana para aliviar an ms los sntomas. selo sobre la piel durante 12 horas y luego retrelo durante 12 horas; use solo 1 parche cada 24 horas. Si tiene algn sntoma que empeora o [place], regrese para una reevaluacin.     ED Prescriptions     Medication Sig Dispense Auth. Provider   tiZANidine (ZANAFLEX) 4 MG tablet Take 1 tablet (4 mg total) by mouth every 8 (eight) hours as needed for muscle spasms. 15 tablet Elsie Baynes K, PA-C   olopatadine (PATADAY) 0.1 % ophthalmic solution Place 1 drop into both eyes 2 (two) times daily. 5 mL Marijane Trower K, PA-C   lidocaine (LIDODERM) 5 % Place 1  patch onto the skin daily. Remove & Discard patch within 12 hours or as directed by MD 30 patch Haddie Bruhl K, PA-C      PDMP not reviewed this encounter.   Jeani Hawking, PA-C 05/31/22 1009

## 2022-05-31 NOTE — ED Triage Notes (Signed)
Pt reports he went to [facility] on Friday.He did not get any meds for back pain. Pt was told on Friday  he did not have hemorrhoids and he was being treated for hemorrhoids by another Provider. Pt wants to know what he has.

## 2022-06-07 ENCOUNTER — Other Ambulatory Visit: Payer: Self-pay

## 2022-06-07 ENCOUNTER — Other Ambulatory Visit: Payer: Self-pay | Admitting: Nurse Practitioner

## 2022-06-07 ENCOUNTER — Encounter: Payer: Self-pay | Admitting: Nurse Practitioner

## 2022-06-07 ENCOUNTER — Ambulatory Visit: Payer: Self-pay | Attending: Nurse Practitioner | Admitting: Nurse Practitioner

## 2022-06-07 VITALS — BP 98/69 | HR 74 | Ht 66.5 in | Wt 182.8 lb

## 2022-06-07 DIAGNOSIS — Z09 Encounter for follow-up examination after completed treatment for conditions other than malignant neoplasm: Secondary | ICD-10-CM

## 2022-06-07 DIAGNOSIS — K5904 Chronic idiopathic constipation: Secondary | ICD-10-CM

## 2022-06-07 DIAGNOSIS — B009 Herpesviral infection, unspecified: Secondary | ICD-10-CM

## 2022-06-07 MED ORDER — LIDOCAINE 5 % EX OINT
1.0000 | TOPICAL_OINTMENT | CUTANEOUS | 1 refills | Status: DC | PRN
Start: 2022-06-07 — End: 2022-07-28
  Filled 2022-06-07: qty 30, 15d supply, fill #0

## 2022-06-07 MED ORDER — POLYETHYLENE GLYCOL 3350 17 GM/SCOOP PO POWD
17.0000 g | Freq: Two times a day (BID) | ORAL | 1 refills | Status: DC | PRN
Start: 2022-06-07 — End: 2022-07-28
  Filled 2022-06-07: qty 510, 15d supply, fill #0

## 2022-06-07 MED ORDER — VALACYCLOVIR HCL 1 G PO TABS
1000.0000 mg | ORAL_TABLET | Freq: Every day | ORAL | 3 refills | Status: DC
Start: 2022-06-07 — End: 2022-06-18
  Filled 2022-06-07: qty 30, 30d supply, fill #0

## 2022-06-07 NOTE — Progress Notes (Signed)
Assessment & Plan:  Gabriel Ball was seen today for hospitalization follow-up.  Diagnoses and all orders for this visit:  Hospital discharge follow-up  Chronic idiopathic constipation -     polyethylene glycol powder (GLYCOLAX/MIRALAX) 17 GM/SCOOP powder; Take 17 g by mouth 2 (two) times daily as needed. -     lidocaine (XYLOCAINE) 5 % ointment; Apply 1 Application topically as needed. Increase water intake to at least 80oz daily. More high fiber fruits and vegetables    Patient has been counseled on age-appropriate routine health concerns for screening and prevention. These are reviewed and up-to-date. Referrals have been placed accordingly. Immunizations are up-to-date or declined.    Subjective:   Chief Complaint  Patient presents with   Hospitalization Follow-up   [name] 52 y.o. male presents to office today for hospital follow up and hemorrhoids with constipation   VRI was used to communicate directly with patient for the entire encounter including providing detailed patient instructions.    Mr Gabriel Ball was seen in the emergeny room on 05-31-2022 with complaints of several days onset of low back pain.  PER ED REPORT  He reports that he fell many years ago and has had ongoing intermittent back pain since that time. Reports that pain is rated 7 on a 0-10 pain scale, localized to his midline lower back, described as aching, no aggravating relieving factors identified. He has not tried any over-the-counter medication for symptom management. Does report that he has a herniated disc in this area and has seen a chiropractor but has not seen a specialist. He denies any associated bowel/bladder continence, lower extremity weakness, saddle anesthesia. He is having difficulty with daily activities as result of symptoms. Denies any known injury increase activity prior to symptom onset. Denies previous spinal surgery. Denies history of malignancy.   Today he reports no concerns of  questions in regard to back pain.  Hemorrhoids: Patient complains of follow up of hemorrhoids. Onset of symptoms was several months ago with unchanged course since that time.  He describes symptoms as anorectal itching, constipation, pain with sitting, and painful defecation. Treatment to date has been OTC creams: somewhat effective stool softeners: somewhat effective. Patient denies family hx of colorectal CA. Last fecal occult test normal 05-28-2022. He was prescribed miralax in the past and has not been taking.    Review of Systems  Constitutional:  Negative for fever, malaise/fatigue and weight loss.  HENT: Negative.  Negative for nosebleeds.   Eyes: Negative.  Negative for blurred vision, double vision and photophobia.  Respiratory: Negative.  Negative for cough and shortness of breath.   Cardiovascular: Negative.  Negative for chest pain, palpitations and leg swelling.  Gastrointestinal:  Positive for constipation. Negative for abdominal pain, blood in stool, diarrhea, heartburn, melena, nausea and vomiting.  Musculoskeletal: Negative.  Negative for myalgias.  Neurological: Negative.  Negative for dizziness, focal weakness, seizures and headaches.  Psychiatric/Behavioral: Negative.  Negative for suicidal ideas.     Past Medical History:  Diagnosis Date   Asthma    Herpes    Shingles     Past Surgical History:  Procedure Laterality Date   MOUTH SURGERY      Family History  Problem Relation Age of Onset   Cancer Father     Social History Reviewed with no changes to be made today.   Outpatient Medications Prior to Visit  Medication Sig Dispense Refill   hydrocortisone (ANUSOL-HC) 25 MG suppository Place 1 suppository (25 mg total) rectally 2 (  two) times daily. 12 suppository 1   Hydrocortisone Acetate 1 % OINT Apply 1 each topically as needed. 28.4 g 2   hydrOXYzine (VISTARIL) 25 MG capsule Take 1 capsule (25 mg total) by mouth every 8 (eight) hours as needed. For anxiety  60 capsule 1   lidocaine (LIDODERM) 5 % Place 1 patch onto the skin daily. Remove & Discard patch within 12 hours or as directed by MD 30 patch 0   olopatadine (PATADAY) 0.1 % ophthalmic solution Place 1 drop into both eyes 2 (two) times daily. 5 mL 0   tiZANidine (ZANAFLEX) 4 MG tablet Take 1 tablet (4 mg total) by mouth every 8 (eight) hours as needed for muscle spasms. 15 tablet 0   No facility-administered medications prior to visit.    Allergies  Allergen Reactions   Cyclobenzaprine     Facial Numbness/dizziness/hypersomnolence   Ibuprofen Hives   Methocarbamol     Abdominal pain   Amoxicillin Itching    Pruritic rash   Penicillins Palpitations    Has patient had a PCN reaction causing immediate rash, facial/tongue/throat swelling, SOB or lightheadedness with hypotension: No Has patient had a PCN reaction causing severe rash involving mucus membranes or skin necrosis: No Has patient had a PCN reaction that required hospitalization: No Has patient had a PCN reaction occurring within the last 10 years: No If all of the above answers are "NO", then may proceed with Cephalosporin use.       Objective:    BP 98/69 (BP Location: Left Arm, Patient Position: Sitting, Cuff Size: Normal)   Pulse 74   Ht 5' 6.5" (1.689 m)   Wt 182 lb 12.8 oz (82.9 kg)   SpO2 99%   BMI 29.06 kg/m  Wt Readings from Last 3 Encounters:  06/07/22 182 lb 12.8 oz (82.9 kg)  05/26/22 186 lb 9.6 oz (84.6 kg)  02/17/22 184 lb 15.5 oz (83.9 kg)    Physical Exam Vitals and nursing note reviewed.  Constitutional:      Appearance: He is well-developed.  HENT:     Head: Normocephalic and atraumatic.  Cardiovascular:     Rate and Rhythm: Normal rate and regular rhythm.     Heart sounds: Normal heart sounds. No murmur heard.    No friction rub. No gallop.  Pulmonary:     Effort: Pulmonary effort is normal. No tachypnea or respiratory distress.     Breath sounds: Normal breath sounds. No decreased breath  sounds, wheezing, rhonchi or rales.  Chest:     Chest wall: No tenderness.  Abdominal:     General: Bowel sounds are normal.     Palpations: Abdomen is soft.  Musculoskeletal:        General: Normal range of motion.     Cervical back: Normal range of motion.  Skin:    General: Skin is warm and dry.  Neurological:     Mental Status: He is alert and oriented to person, place, and time.     Coordination: Coordination normal.  Psychiatric:        Behavior: Behavior normal. Behavior is cooperative.        Thought Content: Thought content normal.        Judgment: Judgment normal.          Patient has been counseled extensively about nutrition and exercise as well as the importance of adherence with medications and regular follow-up. The patient was given clear instructions to go to ER or return to medical center  if symptoms don't improve, worsen or new problems develop. The patient verbalized understanding.   Follow-up: No follow-ups on file.   Claiborne Rigg, FNP-BC [facility] [place], [place] [phone]   06/07/2022, 3:02 PM

## 2022-06-08 ENCOUNTER — Other Ambulatory Visit: Payer: Self-pay

## 2022-06-09 ENCOUNTER — Encounter: Payer: Self-pay | Admitting: Physician Assistant

## 2022-06-15 ENCOUNTER — Other Ambulatory Visit: Payer: Self-pay

## 2022-06-17 ENCOUNTER — Other Ambulatory Visit: Payer: Self-pay

## 2022-06-17 ENCOUNTER — Ambulatory Visit (INDEPENDENT_AMBULATORY_CARE_PROVIDER_SITE_OTHER): Payer: Self-pay

## 2022-06-17 ENCOUNTER — Ambulatory Visit (HOSPITAL_COMMUNITY)
Admission: EM | Admit: 2022-06-17 | Discharge: 2022-06-17 | Disposition: A | Discharge status: Home / Self Care | Payer: Self-pay | Attending: Physician Assistant | Admitting: Physician Assistant

## 2022-06-17 ENCOUNTER — Encounter (HOSPITAL_COMMUNITY): Payer: Self-pay | Admitting: Emergency Medicine

## 2022-06-17 DIAGNOSIS — M549 Dorsalgia, unspecified: Secondary | ICD-10-CM

## 2022-06-17 NOTE — ED Provider Notes (Signed)
[facility]    CSN: [id number] Arrival date & time: 06/17/22  0815      History   Chief Complaint Chief Complaint  Patient presents with   Back Pain    [name] is a 52 y.o. male.   The history is provided by the patient. No language interpreter was used.  Back Pain Location:  Generalized Quality:  Aching Pain severity:  Moderate Onset quality:  Unable to specify Timing:  Constant Progression:  Worsening Chronicity:  New Relieved by:  Nothing Worsened by:  Nothing Ineffective treatments:  None tried   Past Medical History:  Diagnosis Date   Asthma    Herpes    Shingles     Patient Active Problem List   Diagnosis Date Noted   Acute bilateral low back pain without sciatica 06/05/2020   Acute pain of right shoulder 06/05/2020   Asthma due to environmental allergies 12/20/2016   HSV (herpes simplex virus) infection 02/24/2015   Anxiety 04/02/2014    Past Surgical History:  Procedure Laterality Date   MOUTH SURGERY         Home Medications    Prior to Admission medications   Medication Sig Start Date End Date Taking? Authorizing Provider  hydrocortisone (ANUSOL-HC) 25 MG suppository Place 1 suppository (25 mg total) rectally 2 (two) times daily. 05/26/22   Anders Simmonds, PA-C  Hydrocortisone Acetate 1 % OINT Apply 1 each topically as needed. 05/26/22   Anders Simmonds, PA-C  hydrOXYzine (VISTARIL) 25 MG capsule Take 1 capsule (25 mg total) by mouth every 8 (eight) hours as needed. For anxiety 05/26/22   Anders Simmonds, PA-C  lidocaine (LIDODERM) 5 % Place 1 patch onto the skin daily. Remove & Discard patch within 12 hours or as directed by MD 05/31/22   Raspet, Denny Peon K, PA-C  lidocaine (XYLOCAINE) 5 % ointment Apply 1 Application topically as needed. 06/07/22   Claiborne Rigg, NP  olopatadine (PATADAY) 0.1 % ophthalmic solution Place 1 drop into both eyes 2 (two) times daily. 05/31/22   Raspet, Noberto Retort, PA-C  polyethylene glycol  powder (GLYCOLAX/MIRALAX) 17 GM/SCOOP powder Mix 17 g in 4-8 ounces of water/juice and take by mouth 2 (two) times daily as needed. 06/07/22   Claiborne Rigg, NP  tiZANidine (ZANAFLEX) 4 MG tablet Take 1 tablet (4 mg total) by mouth every 8 (eight) hours as needed for muscle spasms. 05/31/22   Raspet, Noberto Retort, PA-C  valACYclovir (VALTREX) 1000 MG tablet Take 1 tablet (1,000 mg total) by mouth daily. 06/07/22   Claiborne Rigg, NP  SUMAtriptan (IMITREX) 50 MG tablet Take one for headache.  May repeat in 2 hours.  No more than 2 a day 05/27/17 12/09/21  Eustace Moore, MD    Family History Family History  Problem Relation Age of Onset   Cancer Father     Social History Social History   Tobacco Use   Smoking status: Former   Smokeless tobacco: Never  Building services engineer Use: Never used  Substance Use Topics   Alcohol use: No   Drug use: No     Allergies   Cyclobenzaprine, Ibuprofen, Methocarbamol, Amoxicillin, and Penicillins   Review of Systems Review of Systems  Musculoskeletal:  Positive for back pain.  All other systems reviewed and are negative.    Physical Exam Triage Vital Signs ED Triage Vitals  Enc Vitals Group     BP 06/17/22 0829 127/73  Pulse Rate 06/17/22 0829 82     Resp 06/17/22 0829 18     Temp 06/17/22 0829 98.2 F (36.8 C)     Temp Source 06/17/22 0829 Oral     SpO2 06/17/22 0829 98 %     Weight --      Height --      Head Circumference --      Peak Flow --      Pain Score 06/17/22 0826 8     Pain Loc --      Pain Edu? --      Excl. in GC? --    No data found.  Updated Vital Signs BP 127/73 (BP Location: Left Arm)   Pulse 82   Temp 98.2 F (36.8 C) (Oral)   Resp 18   SpO2 98%   Visual Acuity Right Eye Distance:   Left Eye Distance:   Bilateral Distance:    Right Eye Near:   Left Eye Near:    Bilateral Near:     Physical Exam Vitals and nursing note reviewed.  Constitutional:      Appearance: He is well-developed.   HENT:     Head: Normocephalic.  Cardiovascular:     Rate and Rhythm: Normal rate.  Pulmonary:     Effort: Pulmonary effort is normal.  Abdominal:     General: Abdomen is flat. There is no distension.  Musculoskeletal:        General: Normal range of motion.     Cervical back: Normal range of motion.  Skin:    General: Skin is warm.  Neurological:     General: No focal deficit present.     Mental Status: He is alert and oriented to person, place, and time.      UC Treatments / Results  Labs (all labs ordered are listed, but only abnormal results are displayed) Labs Reviewed - No data to display  EKG   Radiology DG Chest 2 View  Result Date: 06/17/2022 CLINICAL DATA:  Chest pain EXAM: CHEST - 2 VIEW COMPARISON:  01/18/2022 FINDINGS: The heart size and mediastinal contours are within normal limits. Both lungs are clear. The visualized skeletal structures are unremarkable. IMPRESSION: No active cardiopulmonary disease. Electronically Signed   By: Ernie Avena M.D.   On: 06/17/2022 09:39    Procedures Procedures (including critical care time)  Medications Ordered in UC Medications - No data to display  Initial Impression / Assessment and Plan / UC Course  I have reviewed the triage vital signs and the nursing notes.  Pertinent labs & imaging results that were available during my care of the patient were reviewed by me and considered in my medical decision making (see chart for details).     MDM: Patient symptoms most consistent with musculoskeletal pain.  Patient does have a slight cough.  Patient's chest x-ray is clear.  Patient is advised to take Tylenol every 4 hours for back pain he is advised to continue to take his muscle relaxer and his Vistaril.  He is advised to watch for sedation.  He is advised to recheck with his physician next week if he has not improved Final Clinical Impressions(s) / UC Diagnoses   Final diagnoses:  Acute midline back pain,  unspecified back location     Discharge Instructions      Take tylenol 2 tablets every 4 hours for the next 2 days.  Take your medications as directed    ED Prescriptions   None  PDMP not reviewed this encounter. An After Visit Summary was printed and given to the patient.    Elson Areas, [place] 06/17/22 1315

## 2022-06-17 NOTE — ED Triage Notes (Signed)
2 days ago started having upper back pain , including neck and headache.  Has had this pain before.  Denies fall  Last night took tizanidine and reports it did not help

## 2022-06-17 NOTE — Discharge Instructions (Signed)
Take tylenol 2 tablets every 4 hours for the next 2 days.  Take your medications as directed

## 2022-06-18 ENCOUNTER — Ambulatory Visit (HOSPITAL_COMMUNITY)
Admission: EM | Admit: 2022-06-18 | Discharge: 2022-06-18 | Disposition: A | Discharge status: Home / Self Care | Payer: Self-pay | Attending: Family Medicine | Admitting: Family Medicine

## 2022-06-18 ENCOUNTER — Encounter (HOSPITAL_COMMUNITY): Payer: Self-pay | Admitting: *Deleted

## 2022-06-18 ENCOUNTER — Other Ambulatory Visit: Payer: Self-pay

## 2022-06-18 DIAGNOSIS — K5904 Chronic idiopathic constipation: Secondary | ICD-10-CM

## 2022-06-18 DIAGNOSIS — B009 Herpesviral infection, unspecified: Secondary | ICD-10-CM

## 2022-06-18 MED ORDER — LIDOCAINE VISCOUS HCL 2 % MT SOLN: 15.0000 mL | OROMUCOSAL | 0 refills | Status: DC | PRN

## 2022-06-18 MED ORDER — VALACYCLOVIR HCL 1 G PO TABS: ORAL_TABLET | ORAL | 0 refills | Status: DC

## 2022-06-18 MED ORDER — VALACYCLOVIR HCL 1 G PO TABS
ORAL_TABLET | ORAL | 0 refills | Status: DC
  Filled 2022-06-18: qty 28, 14d supply, fill #0

## 2022-06-18 MED ORDER — VALACYCLOVIR HCL 1 G PO TABS
1000.0000 mg | ORAL_TABLET | Freq: Three times a day (TID) | ORAL | 0 refills | Status: DC
  Filled 2022-06-18: qty 42, 14d supply, fill #0

## 2022-06-18 MED ORDER — LIDOCAINE VISCOUS HCL 2 % MT SOLN
15.0000 mL | OROMUCOSAL | 0 refills | Status: DC | PRN
  Filled 2022-06-18: qty 100, 2d supply, fill #0

## 2022-06-18 NOTE — ED Provider Notes (Signed)
[facility]    CSN: [id number] Arrival date & time: 06/18/22  0806      History   Chief Complaint Chief Complaint  Patient presents with   Mass   Gabriel Ball with Spanish interpreter service used to facilitate communication during visit today.  [name] is a 52 y.o. male.   HPI Patient with a history of HSV presents today with a concern for HSV exacerbation.  He reports on yesterday having pain and burning under tongue.  He looked under his tongue and noticed that he had a large bump in the center of his tongue.  He has previously been prescribed Valtrex but reports that he is currently out of medication.   He denies any pain with swallowing denies that there is any other area within his mouth that is painful. Past Medical History:  Diagnosis Date   Asthma    Herpes    Shingles     Patient Active Problem List   Diagnosis Date Noted   Acute bilateral low back pain without sciatica 06/05/2020   Acute pain of right shoulder 06/05/2020   Asthma due to environmental allergies 12/20/2016   HSV (herpes simplex virus) infection 02/24/2015   Anxiety 04/02/2014    Past Surgical History:  Procedure Laterality Date   MOUTH SURGERY         Home Medications    Prior to Admission medications   Medication Sig Start Date End Date Taking? Authorizing Provider  hydrOXYzine (VISTARIL) 25 MG capsule Take 1 capsule (25 mg total) by mouth every 8 (eight) hours as needed. For anxiety 05/26/22  Yes McClung, Angela M, PA-C  tiZANidine (ZANAFLEX) 4 MG tablet Take 1 tablet (4 mg total) by mouth every 8 (eight) hours as needed for muscle spasms. 05/31/22  Yes Raspet, Noberto Retort, PA-C  hydrocortisone (ANUSOL-HC) 25 MG suppository Place 1 suppository (25 mg total) rectally 2 (two) times daily. 05/26/22   Anders Simmonds, PA-C  Hydrocortisone Acetate 1 % OINT Apply 1 each topically as needed. 05/26/22   Anders Simmonds, PA-C  lidocaine (LIDODERM) 5 % Place 1 patch onto the skin  daily. Remove & Discard patch within 12 hours or as directed by MD 05/31/22   Raspet, Denny Peon K, PA-C  lidocaine (XYLOCAINE) 2 % solution Use as directed 15 mLs in the mouth or throat every 2 (two) hours as needed (oral pain). 06/18/22   Bing Neighbors, NP  lidocaine (XYLOCAINE) 5 % ointment Apply 1 Application topically as needed. 06/07/22   Claiborne Rigg, NP  olopatadine (PATADAY) 0.1 % ophthalmic solution Place 1 drop into both eyes 2 (two) times daily. 05/31/22   Raspet, Noberto Retort, PA-C  polyethylene glycol powder (GLYCOLAX/MIRALAX) 17 GM/SCOOP powder Mix 17 g in 4-8 ounces of water/juice and take by mouth 2 (two) times daily as needed. 06/07/22   Claiborne Rigg, NP  valACYclovir (VALTREX) 1000 MG tablet Take Valtrex 3 times daily by mouth for the next 7 days.  If symptoms have not resolved repeat an additional 7-day course of treatment. 06/18/22   Bing Neighbors, NP  SUMAtriptan (IMITREX) 50 MG tablet Take one for headache.  May repeat in 2 hours.  No more than 2 a day 05/27/17 12/09/21  Eustace Moore, MD    Family History Family History  Problem Relation Age of Onset   Cancer Father     Social History Social History   Tobacco Use   Smoking status: Former   Smokeless tobacco:  Never  Vaping Use   Vaping Use: Never used  Substance Use Topics   Alcohol use: No   Drug use: No     Allergies   Cyclobenzaprine, Ibuprofen, Methocarbamol, Amoxicillin, and Penicillins   Review of Systems Review of Systems Pertinent negatives listed in HPI   Physical Exam Triage Vital Signs ED Triage Vitals  Enc Vitals Group     BP 06/18/22 0852 105/69     Pulse Rate 06/18/22 0852 69     Resp 06/18/22 0852 18     Temp 06/18/22 0852 98.1 F (36.7 C)     Temp Source 06/18/22 0852 Oral     SpO2 06/18/22 0852 98 %     Weight --      Height --      Head Circumference --      Peak Flow --      Pain Score 06/18/22 0851 0     Pain Loc --      Pain Edu? --      Excl. in GC? --    No  data found.  Updated Vital Signs BP 105/69 (BP Location: Left Arm)   Pulse 69   Temp 98.1 F (36.7 C) (Oral)   Resp 18   SpO2 98%   Visual Acuity Right Eye Distance:   Left Eye Distance:   Bilateral Distance:    Right Eye Near:   Left Eye Near:    Bilateral Near:     Physical Exam HENT:     Head: Normocephalic and atraumatic.     Mouth/Throat:     Lips: Pink.     Mouth: Mucous membranes are moist. Oral lesions present.     Pharynx: Oropharynx is clear. Uvula midline.     Comments: This ocular lesions present under the tongue.  Remainder of oral mucosa unremarkable Eyes:     Extraocular Movements: Extraocular movements intact.     Pupils: Pupils are equal, round, and reactive to light.  Cardiovascular:     Rate and Rhythm: Normal rate.  Pulmonary:     Effort: Pulmonary effort is normal.  Musculoskeletal:     Cervical back: Normal range of motion.  Neurological:     General: No focal deficit present.     Mental Status: He is alert.      UC Treatments / Results  Labs (all labs ordered are listed, but only abnormal results are displayed) Labs Reviewed - No data to display  EKG   Radiology DG Chest 2 View  Result Date: 06/17/2022 CLINICAL DATA:  Chest pain EXAM: CHEST - 2 VIEW COMPARISON:  01/18/2022 FINDINGS: The heart size and mediastinal contours are within normal limits. Both lungs are clear. The visualized skeletal structures are unremarkable. IMPRESSION: No active cardiopulmonary disease. Electronically Signed   By: Ernie Avena M.D.   On: 06/17/2022 09:39    Procedures Procedures (including critical care time)  Medications Ordered in UC Medications - No data to display  Initial Impression / Assessment and Plan / UC Course  I have reviewed the triage vital signs and the nursing notes.  Pertinent labs & imaging results that were available during my care of the patient were reviewed by me and considered in my medical decision making (see chart  for details).     HSV infection involving the tongue restarting Valtrex 1000 mg 3 times daily for total of 7 days and may repeat an additional 7 days of treatment if symptoms have not resolved.  Lidocaine viscous provided  to manage oral pain.  Advised to return here for evaluation of symptoms do not improve or follow-up with primary care doctor. Final Clinical Impressions(s) / UC Diagnoses   Final diagnoses:  HSV (herpes simplex virus) infection, tongue   Discharge Instructions   None    ED Prescriptions     Medication Sig Dispense Auth. Provider   valACYclovir (VALTREX) 1000 MG tablet  (Status: Discontinued) Take Valtrex 3 times daily by mouth for the next 7 days.  If symptoms have not resolved repeat an additional 7-day course of treatment. 28 tablet Bing Neighbors, NP   lidocaine (XYLOCAINE) 2 % solution  (Status: Discontinued) Use as directed 15 mLs in the mouth or throat every 2 (two) hours as needed (oral pain). 100 mL Bing Neighbors, NP   lidocaine (XYLOCAINE) 2 % solution Use as directed 15 mLs in the mouth or throat every 2 (two) hours as needed (oral pain). 100 mL Bing Neighbors, NP   valACYclovir (VALTREX) 1000 MG tablet Take Valtrex 3 times daily by mouth for the next 7 days.  If symptoms have not resolved repeat an additional 7-day course of treatment. 28 tablet Bing Neighbors, NP      PDMP not reviewed this encounter.   Bing Neighbors, NP 06/18/22 [phone]

## 2022-06-18 NOTE — ED Triage Notes (Signed)
Adult nurse used for intake    Pt states he has a bump under his tongue he noticed last night.

## 2022-07-12 ENCOUNTER — Emergency Department (HOSPITAL_COMMUNITY)
Admission: EM | Admit: 2022-07-12 | Discharge: 2022-07-12 | Disposition: A | Discharge status: Home / Self Care | Payer: Self-pay | Attending: Emergency Medicine | Admitting: Emergency Medicine

## 2022-07-12 ENCOUNTER — Encounter (HOSPITAL_COMMUNITY): Payer: Self-pay

## 2022-07-12 DIAGNOSIS — R1010 Upper abdominal pain, unspecified: Secondary | ICD-10-CM | POA: Insufficient documentation

## 2022-07-12 LAB — CBC WITH DIFFERENTIAL/PLATELET
Abs Immature Granulocytes: 0.02 10*3/uL (ref 0.00–0.07)
Basophils Absolute: 0 10*3/uL (ref 0.0–0.1)
Basophils Relative: 1 %
Eosinophils Absolute: 0.2 10*3/uL (ref 0.0–0.5)
Eosinophils Relative: 3 %
HCT: 42.6 % (ref 39.0–52.0)
Hemoglobin: 14.7 g/dL (ref 13.0–17.0)
Immature Granulocytes: 0 %
Lymphocytes Relative: 28 %
Lymphs Abs: 1.7 10*3/uL (ref 0.7–4.0)
MCH: 29.6 pg (ref 26.0–34.0)
MCHC: 34.5 g/dL (ref 30.0–36.0)
MCV: 85.9 fL (ref 80.0–100.0)
Monocytes Absolute: 0.4 10*3/uL (ref 0.1–1.0)
Monocytes Relative: 7 %
Neutro Abs: 3.7 10*3/uL (ref 1.7–7.7)
Neutrophils Relative %: 61 %
Platelets: 218 10*3/uL (ref 150–400)
RBC: 4.96 MIL/uL (ref 4.22–5.81)
RDW: 13.3 % (ref 11.5–15.5)
WBC: 6.1 10*3/uL (ref 4.0–10.5)
nRBC: 0 % (ref 0.0–0.2)

## 2022-07-12 LAB — COMPREHENSIVE METABOLIC PANEL
ALT: 23 U/L (ref 0–44)
AST: 22 U/L (ref 15–41)
Albumin: 4 g/dL (ref 3.5–5.0)
Alkaline Phosphatase: 68 U/L (ref 38–126)
Anion gap: 6 (ref 5–15)
BUN: 11 mg/dL (ref 6–20)
CO2: 25 mmol/L (ref 22–32)
Calcium: 8.7 mg/dL — ABNORMAL LOW (ref 8.9–10.3)
Chloride: 108 mmol/L (ref 98–111)
Creatinine, Ser: 0.7 mg/dL (ref 0.61–1.24)
GFR, Estimated: >60 mL/min (ref >60)
Glucose, Bld: 107 mg/dL — ABNORMAL HIGH (ref 70–99)
Potassium: 3.7 mmol/L (ref 3.5–5.1)
Sodium: 139 mmol/L (ref 135–145)
Total Bilirubin: 0.7 mg/dL (ref 0.3–1.2)
Total Protein: 6.7 g/dL (ref 6.5–8.1)

## 2022-07-12 LAB — LIPASE, BLOOD: Lipase: 37 U/L (ref 11–51)

## 2022-07-12 NOTE — ED Triage Notes (Signed)
Wants to get more information about hernia and care especially with work. Concerned it will affect his upcoming colonoscopy.

## 2022-07-12 NOTE — Discharge Instructions (Addendum)
Follow up with [place] [facility] for your abdominal hernia causing your abd pain.   You should not lift over 10 pounds.   Take tylenol for pain

## 2022-07-12 NOTE — ED Provider Notes (Signed)
[place] EM[facility] DEPARTMENT AT [facility] Provider Note   CSN: [id number] Arrival date & time: 07/12/22  1101     History {Add pertinent medical, surgical, social history, OB history to HPI:1} Chief Complaint  Patient presents with   Abdominal Pain    Gabriel Ball is a 52 y.o. male.  Patient has a history of a umbilical hernia.  There is a large hernia.  He has been having periodic pain   Abdominal Pain      Home Medications Prior to Admission medications   Medication Sig Start Date End Date Taking? Authorizing Provider  hydrocortisone (ANUSOL-HC) 25 MG suppository Place 1 suppository (25 mg total) rectally 2 (two) times daily. 05/26/22   Anders Simmonds, PA-C  Hydrocortisone Acetate 1 % OINT Apply 1 each topically as needed. 05/26/22   Anders Simmonds, PA-C  hydrOXYzine (VISTARIL) 25 MG capsule Take 1 capsule (25 mg total) by mouth every 8 (eight) hours as needed. For anxiety 05/26/22   Anders Simmonds, PA-C  lidocaine (LIDODERM) 5 % Place 1 patch onto the skin daily. Remove & Discard patch within 12 hours or as directed by MD 05/31/22   Raspet, Denny Peon K, PA-C  lidocaine (XYLOCAINE) 2 % solution Use as directed 15 mLs in the mouth or throat every 2 (two) hours as needed (oral pain). 06/18/22   Bing Neighbors, NP  lidocaine (XYLOCAINE) 5 % ointment Apply 1 Application topically as needed. 06/07/22   Claiborne Rigg, NP  olopatadine (PATADAY) 0.1 % ophthalmic solution Place 1 drop into both eyes 2 (two) times daily. 05/31/22   Raspet, Noberto Retort, PA-C  polyethylene glycol powder (GLYCOLAX/MIRALAX) 17 GM/SCOOP powder Mix 17 g in 4-8 ounces of water/juice and take by mouth 2 (two) times daily as needed. 06/07/22   Claiborne Rigg, NP  tiZANidine (ZANAFLEX) 4 MG tablet Take 1 tablet (4 mg total) by mouth every 8 (eight) hours as needed for muscle spasms. 05/31/22   Raspet, Noberto Retort, PA-C  valACYclovir (VALTREX) 1000 MG tablet Take 1 tablet (1,000 mg total) by mouth 3  (three) times daily for next 7 days. If symptoms do not resovlve repeat another 7 day course of treatment. 06/18/22   Bing Neighbors, NP  SUMAtriptan (IMITREX) 50 MG tablet Take one for headache.  May repeat in 2 hours.  No more than 2 a day 05/27/17 12/09/21  Eustace Moore, MD      Allergies    Cyclobenzaprine, Ibuprofen, Methocarbamol, Amoxicillin, and Penicillins    Review of Systems   Review of Systems  Gastrointestinal:  Positive for abdominal pain.    Physical Exam Updated Vital Signs BP 97/65   Pulse 68   Temp 98.3 F (36.8 C) (Oral)   Resp 18   SpO2 99%  Physical Exam  ED Results / Procedures / Treatments   Labs (all labs ordered are listed, but only abnormal results are displayed) Labs Reviewed  COMPREHENSIVE METABOLIC PANEL - Abnormal; Notable for the following components:      Result Value   Glucose, Bld 107 (*)    Calcium 8.7 (*)    All other components within normal limits  CBC WITH DIFFERENTIAL/PLATELET  LIPASE, BLOOD    EKG None  Radiology No results found.  Procedures Procedures  {Document cardiac monitor, telemetry assessment procedure when appropriate:1}  Medications Ordered in ED Medications - No data to display  ED Course/ Medical Decision Making/ A&P   {   Click here for  ABCD2, HEART and other calculatorsREFRESH Note before signing :1}                          Medical Decision Making Amount and/or Complexity of Data Reviewed Labs: ordered.  Patient with umbilical hernia.  He is referred to surgery and will take Tylenol for pain  {Document critical care time when appropriate:1} {Document review of labs and clinical decision tools ie heart score, Chads2Vasc2 etc:1}  {Document your independent review of radiology images, and any outside records:1} {Document your discussion with family members, caretakers, and with consultants:1} {Document social determinants of health affecting pt's care:1} {Document your decision making why or  why not admission, treatments were needed:1} Final Clinical Impression(s) / ED Diagnoses Final diagnoses:  Pain of upper abdomen    Rx / DC Orders ED Discharge Orders     None

## 2022-07-12 NOTE — ED Triage Notes (Signed)
Pt dx with umbilical hernia a month ago, has intermittent sharp pains near right umbilicus, not significantly worse but not better. Lifting heavy things at works, concerned what could happen if not fixed. Normal BM. No pain meds taken at home

## 2022-07-28 ENCOUNTER — Encounter: Payer: Self-pay | Admitting: Nurse Practitioner

## 2022-07-28 ENCOUNTER — Ambulatory Visit: Payer: Self-pay | Attending: Nurse Practitioner | Admitting: Nurse Practitioner

## 2022-07-28 VITALS — BP 95/58 | HR 66 | Ht 66.5 in | Wt 181.8 lb

## 2022-07-28 DIAGNOSIS — K5904 Chronic idiopathic constipation: Secondary | ICD-10-CM

## 2022-07-28 DIAGNOSIS — Z09 Encounter for follow-up examination after completed treatment for conditions other than malignant neoplasm: Secondary | ICD-10-CM

## 2022-07-28 MED ORDER — POLYETHYLENE GLYCOL 3350 17 GM/SCOOP PO POWD
17.0000 g | Freq: Two times a day (BID) | ORAL | 1 refills | Status: DC | PRN
Start: 2022-07-28 — End: 2022-08-23

## 2022-07-28 MED ORDER — VALACYCLOVIR HCL 1 G PO TABS
1000.0000 mg | ORAL_TABLET | Freq: Every day | ORAL | 1 refills | Status: DC
  Filled 2022-08-23: qty 30, 30d supply, fill #0

## 2022-07-28 NOTE — Progress Notes (Signed)
Assessment & Plan:  Case was seen today for umbilical hernia.  Diagnoses and all orders for this visit:  Hospital follow up F/U with general surgery and GI as instructed  Umbilical hernia without obstruction and without gangrene -    follow up with General Surgery as scheduled  Chronic idiopathic constipation -     polyethylene glycol powder (GLYCOLAX/MIRALAX) 17 GM/SCOOP powder; Mix 17 g in 4-8 ounces of water/juice and take by mouth 2 (two) times daily as needed.  HSV -     valACYclovir (VALTREX) 1000 MG tablet; Take 1 tablet (1,000 mg total) by mouth daily.    Patient has been counseled on age-appropriate routine health concerns for screening and prevention. These are reviewed and up-to-date. Referrals have been placed accordingly. Immunizations are up-to-date or declined.    Subjective:   Chief Complaint  Patient presents with   Umbilical Hernia   Hospitalization Follow-up   [name] 52 y.o. male presents to office today for hospital follow up.   Mr Gabriel Ball has a history of hemorrhoids, constipation, hiatal and umbilical hernia. He was evaluated in the ED on 07-12-2022 with complaints of abdominal pain (epigastric). Work up did not reveal any acute abdominal complications. He has been referred to general surgery for evaluation of his hernia and GI for hemorrhoids and colonoscopy. He currently denies hematochezia or melena.    He is requesting refills of his valtrex as well today. He takes this for suppression       Review of Systems  Constitutional:  Negative for fever, malaise/fatigue and weight loss.  HENT: Negative.  Negative for nosebleeds.   Eyes: Negative.  Negative for blurred vision, double vision and photophobia.  Respiratory: Negative.  Negative for cough and shortness of breath.   Cardiovascular: Negative.  Negative for chest pain, palpitations and leg swelling.  Gastrointestinal:  Positive for constipation. Negative for abdominal pain,  blood in stool, diarrhea, heartburn, melena, nausea and vomiting.  Musculoskeletal: Negative.  Negative for myalgias.  Neurological: Negative.  Negative for dizziness, focal weakness, seizures and headaches.  Psychiatric/Behavioral: Negative.  Negative for suicidal ideas.     Past Medical History:  Diagnosis Date   Asthma    Herpes    Shingles     Past Surgical History:  Procedure Laterality Date   MOUTH SURGERY      Family History  Problem Relation Age of Onset   Cancer Father     Social History Reviewed with no changes to be made today.   Outpatient Medications Prior to Visit  Medication Sig Dispense Refill   hydrocortisone (ANUSOL-HC) 25 MG suppository Place 1 suppository (25 mg total) rectally 2 (two) times daily. 12 suppository 1   Hydrocortisone Acetate 1 % OINT Apply 1 each topically as needed. 28.4 g 2   hydrOXYzine (VISTARIL) 25 MG capsule Take 1 capsule (25 mg total) by mouth every 8 (eight) hours as needed. For anxiety 60 capsule 1   olopatadine (PATADAY) 0.1 % ophthalmic solution Place 1 drop into both eyes 2 (two) times daily. 5 mL 0   tiZANidine (ZANAFLEX) 4 MG tablet Take 1 tablet (4 mg total) by mouth every 8 (eight) hours as needed for muscle spasms. 15 tablet 0   polyethylene glycol powder (GLYCOLAX/MIRALAX) 17 GM/SCOOP powder Mix 17 g in 4-8 ounces of water/juice and take by mouth 2 (two) times daily as needed. 3350 g 1   lidocaine (LIDODERM) 5 % Place 1 patch onto the skin daily. Remove & Discard patch  within 12 hours or as directed by MD (Patient not taking: Reported on 07/28/2022) 30 patch 0   lidocaine (XYLOCAINE) 2 % solution Use as directed 15 mLs in the mouth or throat every 2 (two) hours as needed (oral pain). (Patient not taking: Reported on 07/28/2022) 100 mL 0   lidocaine (XYLOCAINE) 5 % ointment Apply 1 Application topically as needed. (Patient not taking: Reported on 07/28/2022) 30 g 1   valACYclovir (VALTREX) 1000 MG tablet Take 1 tablet (1,000 mg  total) by mouth 3 (three) times daily for next 7 days. If symptoms do not resovlve repeat another 7 day course of treatment. (Patient not taking: Reported on 07/28/2022) 42 tablet 0   No facility-administered medications prior to visit.    Allergies  Allergen Reactions   Cyclobenzaprine     Facial Numbness/dizziness/hypersomnolence   Ibuprofen Hives   Methocarbamol     Abdominal pain   Amoxicillin Itching    Pruritic rash   Penicillins Palpitations    Has patient had a PCN reaction causing immediate rash, facial/tongue/throat swelling, SOB or lightheadedness with hypotension: No Has patient had a PCN reaction causing severe rash involving mucus membranes or skin necrosis: No Has patient had a PCN reaction that required hospitalization: No Has patient had a PCN reaction occurring within the last 10 years: No If all of the above answers are "NO", then may proceed with Cephalosporin use.       Objective:    BP (!) 95/58 (BP Location: Left Arm, Patient Position: Sitting, Cuff Size: Normal)   Pulse 66   Ht 5' 6.5" (1.689 m)   Wt 181 lb 12.8 oz (82.5 kg)   SpO2 98%   BMI 28.90 kg/m  Wt Readings from Last 3 Encounters:  07/28/22 181 lb 12.8 oz (82.5 kg)  06/07/22 182 lb 12.8 oz (82.9 kg)  05/26/22 186 lb 9.6 oz (84.6 kg)    Physical Exam Vitals and nursing note reviewed.  Constitutional:      Appearance: He is well-developed.  HENT:     Head: Normocephalic and atraumatic.  Cardiovascular:     Rate and Rhythm: Normal rate and regular rhythm.     Heart sounds: Normal heart sounds. No murmur heard.    No friction rub. No gallop.  Pulmonary:     Effort: Pulmonary effort is normal. No tachypnea or respiratory distress.     Breath sounds: Normal breath sounds. No decreased breath sounds, wheezing, rhonchi or rales.  Chest:     Chest wall: No tenderness.  Abdominal:     General: Bowel sounds are normal.     Palpations: Abdomen is soft.  Musculoskeletal:        General:  Normal range of motion.     Cervical back: Normal range of motion.  Skin:    General: Skin is warm and dry.  Neurological:     Mental Status: He is alert and oriented to person, place, and time.     Coordination: Coordination normal.  Psychiatric:        Behavior: Behavior normal. Behavior is cooperative.        Thought Content: Thought content normal.        Judgment: Judgment normal.          Patient has been counseled extensively about nutrition and exercise as well as the importance of adherence with medications and regular follow-up. The patient was given clear instructions to go to ER or return to medical center if symptoms don't improve, worsen or  new problems develop. The patient verbalized understanding.   Follow-up: Return if symptoms worsen or fail to improve.   Claiborne Rigg, FNP-BC [facility], [place] [phone]   07/28/2022, 8:21 PM

## 2022-07-31 ENCOUNTER — Emergency Department (HOSPITAL_COMMUNITY)
Admission: EM | Admit: 2022-07-31 | Discharge: 2022-08-01 | Disposition: A | Discharge status: Home / Self Care | Payer: Self-pay | Attending: Emergency Medicine | Admitting: Emergency Medicine

## 2022-07-31 ENCOUNTER — Encounter (HOSPITAL_COMMUNITY): Payer: Self-pay

## 2022-07-31 ENCOUNTER — Emergency Department (HOSPITAL_COMMUNITY): Payer: Self-pay

## 2022-07-31 ENCOUNTER — Other Ambulatory Visit: Payer: Self-pay

## 2022-07-31 DIAGNOSIS — E876 Hypokalemia: Secondary | ICD-10-CM | POA: Insufficient documentation

## 2022-07-31 DIAGNOSIS — K469 Unspecified abdominal hernia without obstruction or gangrene: Secondary | ICD-10-CM | POA: Insufficient documentation

## 2022-07-31 DIAGNOSIS — K5900 Constipation, unspecified: Secondary | ICD-10-CM | POA: Insufficient documentation

## 2022-07-31 DIAGNOSIS — R109 Unspecified abdominal pain: Secondary | ICD-10-CM | POA: Insufficient documentation

## 2022-07-31 LAB — URINALYSIS, ROUTINE W REFLEX MICROSCOPIC
Bilirubin Urine: NEGATIVE
Glucose, UA: NEGATIVE mg/dL
Hgb urine dipstick: NEGATIVE
Ketones, ur: NEGATIVE mg/dL
Leukocytes,Ua: NEGATIVE
Nitrite: NEGATIVE
Protein, ur: NEGATIVE mg/dL
Specific Gravity, Urine: 1.015 (ref 1.005–1.030)
pH: 6 (ref 5.0–8.0)

## 2022-07-31 LAB — CBC
HCT: 46.1 % (ref 39.0–52.0)
Hemoglobin: 15.8 g/dL (ref 13.0–17.0)
MCH: 29.2 pg (ref 26.0–34.0)
MCHC: 34.3 g/dL (ref 30.0–36.0)
MCV: 85.1 fL (ref 80.0–100.0)
Platelets: 241 10*3/uL (ref 150–400)
RBC: 5.42 MIL/uL (ref 4.22–5.81)
RDW: 13.2 % (ref 11.5–15.5)
WBC: 7 10*3/uL (ref 4.0–10.5)
nRBC: 0 % (ref 0.0–0.2)

## 2022-07-31 LAB — COMPREHENSIVE METABOLIC PANEL
ALT: 41 U/L (ref 0–44)
AST: 27 U/L (ref 15–41)
Albumin: 4.3 g/dL (ref 3.5–5.0)
Alkaline Phosphatase: 75 U/L (ref 38–126)
Anion gap: 7 (ref 5–15)
BUN: 9 mg/dL (ref 6–20)
CO2: 22 mmol/L (ref 22–32)
Calcium: 8.7 mg/dL — ABNORMAL LOW (ref 8.9–10.3)
Chloride: 107 mmol/L (ref 98–111)
Creatinine, Ser: 0.68 mg/dL (ref 0.61–1.24)
GFR, Estimated: >60 mL/min (ref >60)
Glucose, Bld: 123 mg/dL — ABNORMAL HIGH (ref 70–99)
Potassium: 3.3 mmol/L — ABNORMAL LOW (ref 3.5–5.1)
Sodium: 136 mmol/L (ref 135–145)
Total Bilirubin: 0.5 mg/dL (ref 0.3–1.2)
Total Protein: 7.5 g/dL (ref 6.5–8.1)

## 2022-07-31 LAB — LIPASE, BLOOD: Lipase: 38 U/L (ref 11–51)

## 2022-07-31 NOTE — ED Provider Notes (Signed)
[name] EMERGENCY DEPARTMENT AT [facility] Provider Note   CSN: [id number] Arrival date & time: 07/31/22  2108     History {Add pertinent medical, surgical, social history, OB history to HPI:1} Chief Complaint  Patient presents with   Abdominal Pain    Gabriel Ball is a 52 y.o. male who presents with concern for abdominal distention and sensation that his intestines are moving around significantly in his abdomen.  States he has a history of same with reassuring workup in the emergency department in the past.  States he was prescribed medication previously but did not take it because he was concerned about possible side effects.  At this time he states that he presents primarily for reassurance because he has a young daughter for him he is only caretaker.  Endorses 9 normal bowel movements today after laxative this morning.  Endorses history of constipation.  Intermittent bright red blood on toilet tissue following bowel movements during episodes of constipation, history of hemorrhoids.  No melena or hematochezia.  Normal urinary pattern.  No fevers or chills. I reviewed his medical records.  He is history of HSV, asthma, and chronic right shoulder pain.   HPI     Home Medications Prior to Admission medications   Medication Sig Start Date End Date Taking? Authorizing Provider  hydrocortisone (ANUSOL-HC) 25 MG suppository Place 1 suppository (25 mg total) rectally 2 (two) times daily. 05/26/22   Anders Simmonds, PA-C  Hydrocortisone Acetate 1 % OINT Apply 1 each topically as needed. 05/26/22   Anders Simmonds, PA-C  hydrOXYzine (VISTARIL) 25 MG capsule Take 1 capsule (25 mg total) by mouth every 8 (eight) hours as needed. For anxiety 05/26/22   Anders Simmonds, PA-C  olopatadine (PATADAY) 0.1 % ophthalmic solution Place 1 drop into both eyes 2 (two) times daily. 05/31/22   Raspet, Noberto Retort, PA-C  polyethylene glycol powder (GLYCOLAX/MIRALAX) 17 GM/SCOOP powder Mix 17 g  in 4-8 ounces of water/juice and take by mouth 2 (two) times daily as needed. 07/28/22   Claiborne Rigg, NP  tiZANidine (ZANAFLEX) 4 MG tablet Take 1 tablet (4 mg total) by mouth every 8 (eight) hours as needed for muscle spasms. 05/31/22   Raspet, Noberto Retort, PA-C  valACYclovir (VALTREX) 1000 MG tablet Take 1 tablet (1,000 mg total) by mouth daily. 07/28/22   Claiborne Rigg, NP  SUMAtriptan (IMITREX) 50 MG tablet Take one for headache.  May repeat in 2 hours.  No more than 2 a day 05/27/17 12/09/21  Eustace Moore, MD      Allergies    Cyclobenzaprine, Ibuprofen, Methocarbamol, Amoxicillin, and Penicillins    Review of Systems   Review of Systems  Gastrointestinal:  Positive for abdominal distention, abdominal pain and constipation.  Skin:        Itching of the skin    Physical Exam Updated Vital Signs BP 123/71   Pulse 85   Temp 99.3 F (37.4 C)   Resp 17   Ht 5\' 5"  (1.651 m)   Wt 82.6 kg   SpO2 100%   BMI 30.29 kg/m  Physical Exam Vitals and nursing note reviewed.  Constitutional:      Appearance: He is not ill-appearing or toxic-appearing.  HENT:     Head: Normocephalic and atraumatic.     Mouth/Throat:     Mouth: Mucous membranes are moist.     Pharynx: No oropharyngeal exudate or posterior oropharyngeal erythema.  Eyes:     General:  Right eye: No discharge.        Left eye: No discharge.     Conjunctiva/sclera: Conjunctivae normal.  Cardiovascular:     Rate and Rhythm: Normal rate and regular rhythm.     Pulses: Normal pulses.  Pulmonary:     Effort: Pulmonary effort is normal. No respiratory distress.     Breath sounds: Normal breath sounds. No wheezing or rales.  Abdominal:     General: Bowel sounds are normal. There is no distension.     Palpations: Abdomen is soft.     Tenderness: There is no abdominal tenderness. There is no right CVA tenderness, left CVA tenderness, guarding or rebound.     Hernia: A hernia is present. Hernia is present in the  umbilical area.  Musculoskeletal:        General: No deformity.     Cervical back: Neck supple.  Skin:    General: Skin is warm and dry.     Capillary Refill: Capillary refill takes less than 2 seconds.  Neurological:     General: No focal deficit present.     Mental Status: He is alert and oriented to person, place, and time. Mental status is at baseline.  Psychiatric:        Mood and Affect: Mood normal.     ED Results / Procedures / Treatments   Labs (all labs ordered are listed, but only abnormal results are displayed) Labs Reviewed  CBC  URINALYSIS, ROUTINE W REFLEX MICROSCOPIC  LIPASE, BLOOD  COMPREHENSIVE METABOLIC PANEL    EKG None  Radiology No results found.  Procedures Procedures  {Document cardiac monitor, telemetry assessment procedure when appropriate:1}  Medications Ordered in ED Medications - No data to display  ED Course/ Medical Decision Making/ A&P   {   Click here for ABCD2, HEART and other calculatorsREFRESH Note before signing :1}                          Medical Decision Making 52 year old male who presents with abdominal distention and discomfort.  Vital signs are normal on intake.  Physical exam is reassuring with normal cardiopulmonary and abdominal exams.  DDx includes not limited to incarcerated hernia, volvulus, colitis, gastroenteritis, constipation, partial bowel obstruction.   ***  {Document critical care time when appropriate:1} {Document review of labs and clinical decision tools ie heart score, Chads2Vasc2 etc:1}  {Document your independent review of radiology images, and any outside records:1} {Document your discussion with family members, caretakers, and with consultants:1} {Document social determinants of health affecting pt's care:1} {Document your decision making why or why not admission, treatments were needed:1} Final Clinical Impression(s) / ED Diagnoses Final diagnoses:  None    Rx / DC Orders ED Discharge  Orders     None

## 2022-07-31 NOTE — ED Triage Notes (Signed)
Pt. Arrives POV c/o abdominal tightness. Pt. States that he is not hurting, but it feels like his intestines are getting larger and smaller. Pt. States that he had about 9 normal BM today. He denies NVD.

## 2022-08-01 MED ORDER — POTASSIUM CHLORIDE CRYS ER 20 MEQ PO TBCR
20.0000 meq | EXTENDED_RELEASE_TABLET | Freq: Two times a day (BID) | ORAL | 0 refills | Status: DC
  Filled 2022-08-01 – 2022-08-02 (×2): qty 4, 2d supply, fill #0

## 2022-08-01 MED ORDER — IOHEXOL 300 MG/ML  SOLN
100.0000 mL | Freq: Once | INTRAMUSCULAR | Status: AC | PRN
  Administered 2022-08-01: 100 mL via INTRAVENOUS

## 2022-08-01 NOTE — Discharge Instructions (Signed)
Please follow for your previously scheduled visit next month. Return to the ER with any new severe symptoms.

## 2022-08-02 ENCOUNTER — Other Ambulatory Visit: Payer: Self-pay

## 2022-08-06 ENCOUNTER — Other Ambulatory Visit: Payer: Self-pay

## 2022-08-09 ENCOUNTER — Emergency Department (HOSPITAL_COMMUNITY)
Admission: EM | Admit: 2022-08-09 | Discharge: 2022-08-09 | Disposition: A | Discharge status: Home / Self Care | Payer: Self-pay | Attending: Emergency Medicine | Admitting: Emergency Medicine

## 2022-08-09 ENCOUNTER — Encounter (HOSPITAL_COMMUNITY): Payer: Self-pay | Admitting: *Deleted

## 2022-08-09 ENCOUNTER — Other Ambulatory Visit: Payer: Self-pay

## 2022-08-09 ENCOUNTER — Emergency Department (HOSPITAL_COMMUNITY): Payer: Self-pay

## 2022-08-09 DIAGNOSIS — J45909 Unspecified asthma, uncomplicated: Secondary | ICD-10-CM | POA: Insufficient documentation

## 2022-08-09 DIAGNOSIS — R519 Headache, unspecified: Secondary | ICD-10-CM | POA: Insufficient documentation

## 2022-08-09 LAB — COMPREHENSIVE METABOLIC PANEL
ALT: 27 U/L (ref 0–44)
AST: 31 U/L (ref 15–41)
Albumin: 4.7 g/dL (ref 3.5–5.0)
Alkaline Phosphatase: 78 U/L (ref 38–126)
Anion gap: 10 (ref 5–15)
BUN: 11 mg/dL (ref 6–20)
CO2: 22 mmol/L (ref 22–32)
Calcium: 9 mg/dL (ref 8.9–10.3)
Chloride: 105 mmol/L (ref 98–111)
Creatinine, Ser: 0.79 mg/dL (ref 0.61–1.24)
GFR, Estimated: >60 mL/min (ref >60)
Glucose, Bld: 81 mg/dL (ref 70–99)
Potassium: 4.1 mmol/L (ref 3.5–5.1)
Sodium: 137 mmol/L (ref 135–145)
Total Bilirubin: 0.9 mg/dL (ref 0.3–1.2)
Total Protein: 7.7 g/dL (ref 6.5–8.1)

## 2022-08-09 LAB — MAGNESIUM: Magnesium: 2.5 mg/dL — ABNORMAL HIGH (ref 1.7–2.4)

## 2022-08-09 LAB — CBC WITH DIFFERENTIAL/PLATELET
Abs Immature Granulocytes: 0.02 10*3/uL (ref 0.00–0.07)
Basophils Absolute: 0 10*3/uL (ref 0.0–0.1)
Basophils Relative: 1 %
Eosinophils Absolute: 0.1 10*3/uL (ref 0.0–0.5)
Eosinophils Relative: 2 %
HCT: 47.9 % (ref 39.0–52.0)
Hemoglobin: 16.4 g/dL (ref 13.0–17.0)
Immature Granulocytes: 0 %
Lymphocytes Relative: 22 %
Lymphs Abs: 1.2 10*3/uL (ref 0.7–4.0)
MCH: 29.4 pg (ref 26.0–34.0)
MCHC: 34.2 g/dL (ref 30.0–36.0)
MCV: 86 fL (ref 80.0–100.0)
Monocytes Absolute: 0.4 10*3/uL (ref 0.1–1.0)
Monocytes Relative: 7 %
Neutro Abs: 3.8 10*3/uL (ref 1.7–7.7)
Neutrophils Relative %: 68 %
Platelets: 239 10*3/uL (ref 150–400)
RBC: 5.57 MIL/uL (ref 4.22–5.81)
RDW: 12.8 % (ref 11.5–15.5)
WBC: 5.6 10*3/uL (ref 4.0–10.5)
nRBC: 0 % (ref 0.0–0.2)

## 2022-08-09 MED ORDER — LACTATED RINGERS IV BOLUS
1000.0000 mL | Freq: Once | INTRAVENOUS | Status: AC
  Administered 2022-08-09: 1000 mL via INTRAVENOUS

## 2022-08-09 MED ORDER — KETOROLAC TROMETHAMINE 15 MG/ML IJ SOLN
15.0000 mg | Freq: Once | INTRAMUSCULAR | Status: AC
  Administered 2022-08-09: 15 mg via INTRAVENOUS
  Filled 2022-08-09: qty 1

## 2022-08-09 MED ORDER — DIPHENHYDRAMINE HCL 50 MG/ML IJ SOLN
12.5000 mg | Freq: Once | INTRAMUSCULAR | Status: AC
  Administered 2022-08-09: 12.5 mg via INTRAVENOUS
  Filled 2022-08-09: qty 1

## 2022-08-09 MED ORDER — PROCHLORPERAZINE EDISYLATE 10 MG/2ML IJ SOLN
10.0000 mg | Freq: Once | INTRAMUSCULAR | Status: AC
  Administered 2022-08-09: 10 mg via INTRAVENOUS
  Filled 2022-08-09: qty 2

## 2022-08-09 NOTE — ED Provider Notes (Signed)
[place] EMERGENCY DEPARTMENT AT [facility] Provider Note   CSN: [id number] Arrival date & time: 08/09/22  1319     History  No chief complaint on file.   Gabriel Ball is a 52 y.o. male.  The history is provided by the patient. The history is limited by a language barrier. A language interpreter was used.   Patient presents for headache.  Medical history includes anxiety, HSV, asthma.  He was last seen in the emergency department 9 days ago for abdominal pain.  Today, patient awoke with a frontal headache.  He denies any nausea or vomiting.  He also had pain in his ears as well as the musculature in his posterior neck.  He treated his pain with 2 tablets of Aleve this morning.  He took Tylenol this afternoon.  Current pain is improved and currently 4/10 in severity.  He endorses severe anxiety given his recent symptoms.    Home Medications Prior to Admission medications   Medication Sig Start Date End Date Taking? Authorizing Provider  hydrocortisone (ANUSOL-HC) 25 MG suppository Place 1 suppository (25 mg total) rectally 2 (two) times daily. 05/26/22   Anders Simmonds, PA-C  Hydrocortisone Acetate 1 % OINT Apply 1 each topically as needed. 05/26/22   Anders Simmonds, PA-C  hydrOXYzine (VISTARIL) 25 MG capsule Take 1 capsule (25 mg total) by mouth every 8 (eight) hours as needed. For anxiety 05/26/22   Anders Simmonds, PA-C  olopatadine (PATADAY) 0.1 % ophthalmic solution Place 1 drop into both eyes 2 (two) times daily. 05/31/22   Raspet, Noberto Retort, PA-C  polyethylene glycol powder (GLYCOLAX/MIRALAX) 17 GM/SCOOP powder Mix 17 g in 4-8 ounces of water/juice and take by mouth 2 (two) times daily as needed. 07/28/22   Claiborne Rigg, NP  potassium chloride SA (KLOR-CON M) 20 MEQ tablet Take 1 tablet (20 mEq total) by mouth 2 (two) times daily for 2 days. 08/01/22 08/04/22  Sponseller, Lupe Carney R, PA-C  tiZANidine (ZANAFLEX) 4 MG tablet Take 1 tablet (4 mg total) by mouth  every 8 (eight) hours as needed for muscle spasms. 05/31/22   Raspet, Noberto Retort, PA-C  valACYclovir (VALTREX) 1000 MG tablet Take 1 tablet (1,000 mg total) by mouth daily. 07/28/22   Claiborne Rigg, NP  SUMAtriptan (IMITREX) 50 MG tablet Take one for headache.  May repeat in 2 hours.  No more than 2 a day 05/27/17 12/09/21  Eustace Moore, MD      Allergies    Cyclobenzaprine, Ibuprofen, Methocarbamol, Amoxicillin, and Penicillins    Review of Systems   Review of Systems  HENT:  Positive for ear pain.   Musculoskeletal:  Positive for neck pain.  Neurological:  Positive for headaches.  Psychiatric/Behavioral:  The patient is nervous/anxious.   All other systems reviewed and are negative.   Physical Exam Updated Vital Signs BP 107/64 (BP Location: Left Arm)   Pulse 74   Temp 98.3 F (36.8 C) (Oral)   Resp 19   Ht 5\' 5"  (1.651 m)   Wt 82.1 kg   SpO2 99%   BMI 30.12 kg/m  Physical Exam Vitals and nursing note reviewed.  Constitutional:      General: He is not in acute distress.    Appearance: Normal appearance. He is well-developed. He is not ill-appearing, toxic-appearing or diaphoretic.  HENT:     Head: Normocephalic and atraumatic.     Right Ear: Ear canal and external ear normal.  Left Ear: Tympanic membrane, ear canal and external ear normal.     Ears:     Comments: Cerumen present in right ear, obstructing visualization of TM.    Nose: Nose normal.     Mouth/Throat:     Mouth: Mucous membranes are moist.  Eyes:     Extraocular Movements: Extraocular movements intact.     Conjunctiva/sclera: Conjunctivae normal.  Cardiovascular:     Rate and Rhythm: Normal rate and regular rhythm.     Heart sounds: No murmur heard. Pulmonary:     Effort: Pulmonary effort is normal. No respiratory distress.     Breath sounds: Normal breath sounds. No wheezing or rales.  Abdominal:     General: There is no distension.     Palpations: Abdomen is soft.     Tenderness: There is  no abdominal tenderness.  Musculoskeletal:        General: No swelling. Normal range of motion.     Cervical back: Normal range of motion and neck supple.     Right lower leg: No edema.     Left lower leg: No edema.  Skin:    General: Skin is warm and dry.     Coloration: Skin is not jaundiced or pale.  Neurological:     General: No focal deficit present.     Mental Status: He is alert and oriented to person, place, and time.     Cranial Nerves: No cranial nerve deficit.     Sensory: No sensory deficit.     Motor: No weakness.     Coordination: Coordination normal.  Psychiatric:        Mood and Affect: Affect normal. Mood is anxious.        Speech: Speech normal.        Behavior: Behavior normal. Behavior is cooperative.        Thought Content: Thought content normal.     ED Results / Procedures / Treatments   Labs (all labs ordered are listed, but only abnormal results are displayed) Labs Reviewed  CBC WITH DIFFERENTIAL/PLATELET  COMPREHENSIVE METABOLIC PANEL  MAGNESIUM    EKG EKG Interpretation Date/Time:  Monday August 09 2022 14:33:35 EDT Ventricular Rate:  74 PR Interval:  152 QRS Duration:  106 QT Interval:  387 QTC Calculation: 430 R Axis:   56  Text Interpretation: Sinus rhythm Confirmed by Gloris Manchester ([id number]) on 08/09/2022 2:50:02 PM  Radiology CT HEAD WO CONTRAST  Result Date: 08/09/2022 CLINICAL DATA:  Headache, new onset (Age >= 51y) EXAM: CT HEAD WITHOUT CONTRAST TECHNIQUE: Contiguous axial images were obtained from the base of the skull through the vertex without intravenous contrast. RADIATION DOSE REDUCTION: This exam was performed according to the departmental dose-optimization program which includes automated exposure control, adjustment of the mA and/or kV according to patient size and/or use of iterative reconstruction technique. COMPARISON:  CT Head 07/25/19 FINDINGS: Brain: No evidence of acute infarction, hemorrhage, hydrocephalus, extra-axial  collection or mass lesion/mass effect. Vascular: No hyperdense vessel or unexpected calcification. Skull: Normal. Negative for fracture or focal lesion. Sinuses/Orbits: No middle ear or mastoid effusion. Paranasal sinuses are clear. Orbits are unremarkable. Other: None. IMPRESSION: No acute intracranial abnormality. No CT etiology for headaches identified Electronically Signed   By: Lorenza Cambridge M.D.   On: 08/09/2022 15:37    Procedures Procedures    Medications Ordered in ED Medications  ketorolac (TORADOL) 15 MG/ML injection 15 mg (15 mg Intravenous Given 08/09/22 1524)  prochlorperazine (COMPAZINE) injection 10 mg (  10 mg Intravenous Given 08/09/22 1525)  diphenhydrAMINE (BENADRYL) injection 12.5 mg (12.5 mg Intravenous Given 08/09/22 1525)  lactated ringers bolus 1,000 mL (1,000 mLs Intravenous New Bag/Given 08/09/22 1530)    ED Course/ Medical Decision Making/ A&P Clinical Course as of 08/09/22 1548  Mon Aug 09, 2022  1518 Stable 51 YOM with frontal headache.  Hx of similar. CT/treatments and reassessment [CC]    Clinical Course User Index [CC] Glyn Ade, MD                             Medical Decision Making Amount and/or Complexity of Data Reviewed Labs: ordered. Radiology: ordered.  Risk Prescription drug management.  Patient presents for frontal headache.  This was present on awakening this morning.  He did have improved symptoms with Advil this morning and Tylenol this afternoon.  Current pain is 4/10 in severity.  Patient is well-appearing on exam.  He has no focal neurologic deficits.  He does seem quite anxious about his recent symptoms.  He does have a history of frequent ED visits for anxiety over different medical symptoms.  Headache cocktail was ordered.  Lab work and CT imaging were ordered.  CT showed no acute findings.  Lab work is pending at time of signout.  Care of patient was signed out to oncoming ED provider.         Final Clinical Impression(s)  / ED Diagnoses Final diagnoses:  Bad headache    Rx / DC Orders ED Discharge Orders     None         Gloris Manchester, MD 08/09/22 1549

## 2022-08-09 NOTE — ED Provider Notes (Signed)
Care of patient received from prior provider at 4:56 PM, please see their note for complete H/P and care plan.  Received handoff per ED course.  Clinical Course as of 08/09/22 1656  Mon Aug 09, 2022  1518 Stable 51 YOM with frontal headache.  Hx of similar. CT/treatments and reassessment [CC]    Clinical Course User Index [CC] Glyn Ade, MD    Reassessment: On reassessment, his headache is improved. His CT scan shows no acute pathology. Notably patient has had 10 visits over the past month for multiple complaints.  On reassessment at this time he states his symptoms are resolved and patient is requesting discharge.  Has an extensive medical history but states that retrospectively he feels that his headache was likely from his anxiety.  Headache was only ever a 4 out of 10 in severity and is now a 0 out of 10. Given interval improvement with treatment and overall well appearance, patient discharged with no further acute events.  Spanish interpreter was used for this encounter..   Disposition:  I have considered need for hospitalization, however, considering all of the above, I believe this patient is stable for discharge at this time.  Patient/family educated about specific return precautions for given chief complaint and symptoms.  Patient/family educated about follow-up with PCP.     Patient/family expressed understanding of return precautions and need for follow-up. Patient spoken to regarding all imaging and laboratory results and appropriate follow up for these results. All education provided in verbal form with additional information in written form. Time was allowed for answering of patient questions. Patient discharged.    Emergency Department Medication Summary:   Medications  ketorolac (TORADOL) 15 MG/ML injection 15 mg (15 mg Intravenous Given 08/09/22 1524)  prochlorperazine (COMPAZINE) injection 10 mg (10 mg Intravenous Given 08/09/22 1525)  diphenhydrAMINE (BENADRYL)  injection 12.5 mg (12.5 mg Intravenous Given 08/09/22 1525)  lactated ringers bolus 1,000 mL (1,000 mLs Intravenous New Bag/Given 08/09/22 1530)          Glyn Ade, MD 08/09/22 1656

## 2022-08-09 NOTE — ED Triage Notes (Signed)
Woke this am with headache and feels like his eyes are going to pop out. Confirms some nasal congestion. In past had similar H/a related to anxiety. Pt is worried about a hernia .

## 2022-08-11 ENCOUNTER — Encounter (HOSPITAL_COMMUNITY): Payer: Self-pay

## 2022-08-11 ENCOUNTER — Ambulatory Visit (HOSPITAL_COMMUNITY)
Admission: EM | Admit: 2022-08-11 | Discharge: 2022-08-11 | Disposition: A | Discharge status: Home / Self Care | Payer: Self-pay | Attending: Family Medicine | Admitting: Family Medicine

## 2022-08-11 DIAGNOSIS — H6121 Impacted cerumen, right ear: Secondary | ICD-10-CM

## 2022-08-11 MED ORDER — CARBAMIDE PEROXIDE 6.5 % OT SOLN: 5.0000 [drp] | Freq: Two times a day (BID) | OTIC | 0 refills | Status: DC | PRN

## 2022-08-11 NOTE — ED Provider Notes (Signed)
[facility]    CSN: [id number] Arrival date & time: 08/11/22  2706      History   Chief Complaint Chief Complaint  Patient presents with   Otalgia   Interpreter service used to facilitate communication during today's encounter. [name] is a 52 y.o. male.   HPI Patient presents today for evaluation otalgia.  He reports right ear is greater than left.  Patient was seen at the ED for the same problem approximately 2 days ago.  Reports that the doctor the ED told him he could not see anything wrong with his ear.  Patient endorses use of Q-tips to remove earwax.  He denies any URI symptoms.  He has not taken any ibuprofen or Tylenol today for his pain symptoms.  Denies any difficulty hearing. Past Medical History:  Diagnosis Date   Asthma    Herpes    Shingles     Patient Active Problem List   Diagnosis Date Noted   Acute bilateral low back pain without sciatica 06/05/2020   Acute pain of right shoulder 06/05/2020   Asthma due to environmental allergies 12/20/2016   HSV (herpes simplex virus) infection 02/24/2015   Anxiety 04/02/2014    Past Surgical History:  Procedure Laterality Date   MOUTH SURGERY         Home Medications    Prior to Admission medications   Medication Sig Start Date End Date Taking? Authorizing Provider  carbamide peroxide (DEBROX) 6.5 % OTIC solution Place 5 drops into the right ear 2 (two) times daily as needed. 08/11/22  Yes Bing Neighbors, NP  hydrocortisone (ANUSOL-HC) 25 MG suppository Place 1 suppository (25 mg total) rectally 2 (two) times daily. 05/26/22   Anders Simmonds, PA-C  Hydrocortisone Acetate 1 % OINT Apply 1 each topically as needed. 05/26/22   Anders Simmonds, PA-C  hydrOXYzine (VISTARIL) 25 MG capsule Take 1 capsule (25 mg total) by mouth every 8 (eight) hours as needed. For anxiety 05/26/22   Anders Simmonds, PA-C  olopatadine (PATADAY) 0.1 % ophthalmic solution Place 1 drop into both eyes 2 (two)  times daily. 05/31/22   Raspet, Noberto Retort, PA-C  polyethylene glycol powder (GLYCOLAX/MIRALAX) 17 GM/SCOOP powder Mix 17 g in 4-8 ounces of water/juice and take by mouth 2 (two) times daily as needed. 07/28/22   Claiborne Rigg, NP  potassium chloride SA (KLOR-CON M) 20 MEQ tablet Take 1 tablet (20 mEq total) by mouth 2 (two) times daily for 2 days. 08/01/22 08/04/22  Sponseller, Lupe Carney R, PA-C  tiZANidine (ZANAFLEX) 4 MG tablet Take 1 tablet (4 mg total) by mouth every 8 (eight) hours as needed for muscle spasms. 05/31/22   Raspet, Noberto Retort, PA-C  valACYclovir (VALTREX) 1000 MG tablet Take 1 tablet (1,000 mg total) by mouth daily. 07/28/22   Claiborne Rigg, NP  SUMAtriptan (IMITREX) 50 MG tablet Take one for headache.  May repeat in 2 hours.  No more than 2 a day 05/27/17 12/09/21  Eustace Moore, MD    Family History Family History  Problem Relation Age of Onset   Cancer Father     Social History Social History   Tobacco Use   Smoking status: Former   Smokeless tobacco: Never  Vaping Use   Vaping status: Never Used  Substance Use Topics   Alcohol use: No   Drug use: No     Allergies   Cyclobenzaprine, Ibuprofen, Methocarbamol, Amoxicillin, and Penicillins Review of Systems Review of  Systems Pertinent negatives listed in HPI   Physical Exam Triage Vital Signs ED Triage Vitals  Encounter Vitals Group     BP 08/11/22 0906 116/75     Systolic BP Percentile --      Diastolic BP Percentile --      Pulse Rate 08/11/22 0906 72     Resp 08/11/22 0906 16     Temp 08/11/22 0906 98.5 F (36.9 C)     Temp Source 08/11/22 0906 Oral     SpO2 08/11/22 0906 98 %     Weight 08/11/22 0905 181 lb (82.1 kg)     Height 08/11/22 0905 5\' 5"  (1.651 m)     Head Circumference --      Peak Flow --      Pain Score 08/11/22 0905 2     Pain Loc --      Pain Education --      Exclude from Growth Chart --    No data found.  Updated Vital Signs BP 116/75 (BP Location: Left Arm)   Pulse 72    Temp 98.5 F (36.9 C) (Oral)   Resp 16   Ht 5\' 5"  (1.651 m)   Wt 181 lb (82.1 kg)   SpO2 98%   BMI 30.12 kg/m   Visual Acuity Right Eye Distance:   Left Eye Distance:   Bilateral Distance:    Right Eye Near:   Left Eye Near:    Bilateral Near:     Physical Exam Vitals reviewed.  Constitutional:      Appearance: Normal appearance.  HENT:     Right Ear: Hearing, ear canal and external ear normal. There is impacted cerumen.     Left Ear: Hearing, tympanic membrane and external ear normal.  Eyes:     Extraocular Movements: Extraocular movements intact.     Conjunctiva/sclera: Conjunctivae normal.     Pupils: Pupils are equal, round, and reactive to light.  Cardiovascular:     Rate and Rhythm: Normal rate and regular rhythm.  Pulmonary:     Effort: Pulmonary effort is normal.     Breath sounds: Normal breath sounds.  Musculoskeletal:     Cervical back: Normal range of motion and neck supple.  Skin:    General: Skin is warm and dry.  Neurological:     General: No focal deficit present.     Mental Status: He is alert.    UC Treatments / Results  Labs (all labs ordered are listed, but only abnormal results are displayed) Labs Reviewed - No data to display  EKG   Radiology   Procedures Procedures (including critical care time)  Medications Ordered in UC Medications - No data to display  Initial Impression / Assessment and Plan / UC Course  I have reviewed the triage vital signs and the nursing notes.  Pertinent labs & imaging results that were available during my care of the patient were reviewed by me and considered in my medical decision making (see chart for details).    Impacted cerumen in right ear, ear lavage performed in right ear only by CMA.  Patient tolerated procedure.  Encouraged patient to use Debrox drops and avoid inserting Q-tips into his ear as he is only pushing wax deeper into the ear canal and compressing against the TM which is causing him  discomfort.  Patient verbalized understanding and agreement with plan. Final Clinical Impressions(s) / UC Diagnoses   Final diagnoses:  Impacted cerumen of right ear  Discharge Instructions      Debrox ear drops twice daily as needed for cerumen impaction and or ear fullness.     ED Prescriptions     Medication Sig Dispense Auth. Provider   carbamide peroxide (DEBROX) 6.5 % OTIC solution Place 5 drops into the right ear 2 (two) times daily as needed. 15 mL Bing Neighbors, NP      PDMP not reviewed this encounter.   Bing Neighbors, NP 08/11/22 1059

## 2022-08-11 NOTE — ED Triage Notes (Signed)
Patient here today with c/o right ear pain X 3 days. He has also been having a little cough and runny nose. He states that sometimes he can't hear well. He also has a headache. He went to the hospital on Sunday.

## 2022-08-11 NOTE — Discharge Instructions (Signed)
Debrox ear drops twice daily as needed for cerumen impaction and or ear fullness.

## 2022-08-20 ENCOUNTER — Ambulatory Visit (INDEPENDENT_AMBULATORY_CARE_PROVIDER_SITE_OTHER): Payer: Self-pay | Admitting: Surgery

## 2022-08-20 ENCOUNTER — Encounter: Payer: Self-pay | Admitting: Surgery

## 2022-08-20 VITALS — BP 114/72 | HR 63 | Temp 98.5°F | Wt 176.8 lb

## 2022-08-20 DIAGNOSIS — K429 Umbilical hernia without obstruction or gangrene: Secondary | ICD-10-CM

## 2022-08-20 DIAGNOSIS — K420 Umbilical hernia with obstruction, without gangrene: Secondary | ICD-10-CM

## 2022-08-20 NOTE — Patient Instructions (Addendum)
Hernia umbilical en los adultos Umbilical Hernia, Adult  Una hernia es un bulto de tejido que sobresale a travs de una abertura [employer]. Una hernia umbilical se forma en el vientre, cerca del ombligo. La hernia puede contener tejido del intestino delgado o del intestino grueso. Tambin puede contener el tejido graso que recubre los intestinos. Las hernias USAA en los adultos pueden empeorar con el [place]. Deben tratarse con ciruga. Hay varios tipos de hernias umbilicales. Incluyen los siguientes: Hernia indirecta. Esta ocurre justo por encima o por debajo del ombligo. Es el tipo de hernia umbilical ms frecuente en los adultos. Hernia directa. Este tipo ocurre en una abertura formada por el ombligo. Hernia reducible. Esta hernia viene y va. Es posible que se vea solamente al hacer fuerza, al toser o al levantar algo pesado. Este tipo de hernia se puede reintroducir en el vientre (reducir). Hernia encarcelada. La hernia queda atrapada en la pared del vientre. Este tipo de hernia no se puede reintroducir en el vientre. Puede causar una hernia estrangulada. Hernia estrangulada. Se trata de una hernia que interrumpe el flujo de sangre a los tejidos en su interior. Si esto ocurre, los tejidos [employer]. Este tipo de hernia se debe tratar de inmediato. Cules son las causas? Una hernia umbilical aparece cuando el tejido que est dentro del vientre empuja a travs de una abertura en los msculos abdominales. Qu incrementa el riesgo? Es ms probable que tenga este tipo de hernia si: Hace fuerza al levantar o empujar objetos pesados. Tuvo varios embarazos. Tiene una afeccin que ejerce presin sobre el vientre y la ha tenido durante [place]. Esto incluye lo siguiente: Obesidad. Acumulacin de lquido en el vientre. Vomita o tose todo [employer]. Dificultad para defecar (estreimiento). Se ha sometido a una ciruga que [employer] del vientre. Cules son los signos  o sntomas? El principal sntoma de esta afeccin es un bulto en el ombligo o cerca de [place]. El bulto no causa dolor. Otros sntomas dependen del tipo de hernia que tenga. Una hernia reducible puede verse solo al Artist, toser o Lexicographer algo pesado. Otros sntomas pueden incluir: Dolor sordo. Sensacin de opresin. Una hernia encarcelada puede causar un dolor muy intenso. Adems, usted puede: Vomitar o tener nuseas. No poder eliminar gases. Una hernia estrangulada puede causar lo siguiente: Dolor que empeora cada vez ms. Vmitos o ganas de vomitar. Dolor al ejercer presin sobre la hernia. Cambio de color en la piel sobre la hernia. La piel puede ponerse roja o morada. Problemas para defecar. Sangre en las heces. Cmo se diagnostica? Esta afeccin se puede diagnosticar en funcin de lo siguiente: Los sntomas y antecedentes mdicos. Un examen fsico. Pueden pedirle que tosa o que haga fuerza mientras est de pie. Al hacer eso, ejercer presin dentro del vientre. La presin puede empujar la hernia a travs de la [employer]. El mdico puede intentar empujar la hernia nuevamente hacia adentro del vientre (reducirla). Cmo se trata? La ciruga es el nico tratamiento para una hernia umbilical. La ciruga para una hernia estrangulada debe realizarse de inmediato. Si tiene una hernia pequea que no est encarcelada, tal vez tenga que bajar de peso antes de que se realice la [place]. Siga estas indicaciones en su casa: Control del estreimiento Es posible que tenga que tomar estas medidas para prevenir los problemas para defecar. Esto ayudar a evitar que haga fuerza. Beber suficiente lquido para Radio producer pis (la orina) de color amarillo plido. Usar  medicamentos recetados o de [employer]. Consumir alimentos ricos en fibra, como frijoles, cereales integrales, y frutas y verduras frescas. Limitar el consumo de alimentos ricos en grasa y azcares, como los  alimentos fritos o dulces. Indicaciones generales No trate de reintroducir la hernia. Baje de peso si se lo indic el mdico. Observe si la hernia cambia de color o de tamao. Infrmele al mdico si se producen cambios. Tal vez deba evitar las actividades que ejercen presin sobre la hernia. Es posible que Personnel officer objetos. Pregntele al mdico cunto peso puede levantar sin correr [employer]. Use los medicamentos de venta libre y los recetados solamente como se lo haya indicado el mdico. Comunquese con un mdico si: La hernia se agranda o se endurece. La hernia le causa dolor. Tiene fiebre o escalofros. Solicite ayuda de inmediato si: Siente un dolor muy intenso cerca de la zona de la hernia, y el dolor aparece repentinamente. Tiene dolor y vomita o siente ganas de Biochemist, clinical. La piel que recubre la hernia cambia de color. Estos sntomas pueden Customer service manager. Solicite ayuda de inmediato. Llame al 911. No espere hasta que los sntomas desaparezcan. No conduzca por sus propios medios [employer]. Esta informacin no tiene Theme park manager el consejo del mdico. Asegrese de hacerle al mdico cualquier pregunta que tenga. Document Revised: 05/20/2022 Document Reviewed: 05/20/2022 Elsevier Patient Education  2024 [employer].

## 2022-08-20 NOTE — Progress Notes (Signed)
08/20/2022  Reason for Visit: Umbilical hernia  History of Present Illness: Gabriel Ball is a 52 y.o. male presenting for evaluation of an umbilical hernia.  The patient has multiple visits to the emergency room for different complaints including also abdominal pain.  He has had issues with chronic constipation as well.  He had a CT scan on January 2024 which showed a small fat-containing umbilical hernia.  However on further CT scan on 05/28/2022 dyspnea showed a umbilical hernia containing a loop of small bowel.  This was confirmed again on his recent CT scan on 07/31/2022 which showed the same.  The patient reports that he has been having abdominal discomfort at the umbilical area particular if he is doing anything with exertion.  Sometimes with his constipation that is also been an issue.  He feels pain around the umbilical region.  Denies any sensation of something bulging out and otherwise his hernia itself is not too visible from the outside.  Denies any fevers, chills, chest pain, shortness of breath.  He also has had issues with hemorrhoids and some bleeding intermittently and he has an appointment with gastroenterology next week.  Past Medical History: Past Medical History:  Diagnosis Date   Asthma    Herpes    Shingles      Past Surgical History: Past Surgical History:  Procedure Laterality Date   MOUTH SURGERY      Home Medications: Prior to Admission medications   Medication Sig Start Date End Date Taking? Authorizing Provider  carbamide peroxide (DEBROX) 6.5 % OTIC solution Place 5 drops into the right ear 2 (two) times daily as needed. 08/11/22  Yes Bing Neighbors, NP  hydrocortisone (ANUSOL-HC) 25 MG suppository Place 1 suppository (25 mg total) rectally 2 (two) times daily. 05/26/22  Yes Georgian Co M, PA-C  Hydrocortisone Acetate 1 % OINT Apply 1 each topically as needed. 05/26/22  Yes Anders Simmonds, PA-C  hydrOXYzine (VISTARIL) 25 MG capsule Take 1 capsule  (25 mg total) by mouth every 8 (eight) hours as needed. For anxiety 05/26/22  Yes McClung, Angela M, PA-C  olopatadine (PATADAY) 0.1 % ophthalmic solution Place 1 drop into both eyes 2 (two) times daily. 05/31/22  Yes Raspet, Erin K, PA-C  polyethylene glycol powder (GLYCOLAX/MIRALAX) 17 GM/SCOOP powder Mix 17 g in 4-8 ounces of water/juice and take by mouth 2 (two) times daily as needed. 07/28/22  Yes Claiborne Rigg, NP  tiZANidine (ZANAFLEX) 4 MG tablet Take 1 tablet (4 mg total) by mouth every 8 (eight) hours as needed for muscle spasms. 05/31/22  Yes Raspet, Noberto Retort, PA-C  valACYclovir (VALTREX) 1000 MG tablet Take 1 tablet (1,000 mg total) by mouth daily. 07/28/22  Yes Claiborne Rigg, NP  potassium chloride SA (KLOR-CON M) 20 MEQ tablet Take 1 tablet (20 mEq total) by mouth 2 (two) times daily for 2 days. 08/01/22 08/04/22  Sponseller, Lupe Carney R, PA-C  SUMAtriptan (IMITREX) 50 MG tablet Take one for headache.  May repeat in 2 hours.  No more than 2 a day 05/27/17 12/09/21  Eustace Moore, MD    Allergies: Allergies  Allergen Reactions   Cyclobenzaprine     Facial Numbness/dizziness/hypersomnolence   Ibuprofen Hives   Methocarbamol     Abdominal pain   Amoxicillin Itching    Pruritic rash   Penicillins Palpitations    Has patient had a PCN reaction causing immediate rash, facial/tongue/throat swelling, SOB or lightheadedness with hypotension: No Has patient had a PCN reaction  causing severe rash involving mucus membranes or skin necrosis: No Has patient had a PCN reaction that required hospitalization: No Has patient had a PCN reaction occurring within the last 10 years: No If all of the above answers are "NO", then may proceed with Cephalosporin use.    Social History:  reports that he has quit smoking. He has never used smokeless tobacco. He reports that he does not drink alcohol and does not use drugs.   Family History: Family History  Problem Relation Age of Onset   Cancer  Father     Review of Systems: Review of Systems  Constitutional:  Negative for chills and fever.  HENT:  Negative for hearing loss.   Respiratory:  Negative for shortness of breath.   Cardiovascular:  Negative for chest pain.  Gastrointestinal:  Positive for abdominal pain and constipation. Negative for nausea and vomiting.  Genitourinary:  Negative for dysuria.  Musculoskeletal:  Negative for myalgias.  Skin:  Negative for rash.  Neurological:  Negative for dizziness.  Psychiatric/Behavioral:  Negative for depression.     Physical Exam BP 114/72   Pulse 63   Temp 98.5 F (36.9 C) (Oral)   Wt 176 lb 12.8 oz (80.2 kg)   SpO2 99%   BMI 29.42 kg/m  CONSTITUTIONAL: No acute distress, well-nourished HEENT:  Normocephalic, atraumatic, extraocular motion intact. NECK: Trachea is midline, and there is no jugular venous distension.  RESPIRATORY:  Lungs are clear, and breath sounds are equal bilaterally. Normal respiratory effort without pathologic use of accessory muscles. CARDIOVASCULAR: Heart is regular without murmurs, gallops, or rubs. GI: The abdomen is soft, nondistended, with some tenderness to palpation of the umbilicus itself.  Patient has a reducible hernia which is about 2 cm in size.  MUSCULOSKELETAL:  Normal muscle strength and tone in all four extremities.  No peripheral edema or cyanosis. SKIN: Skin turgor is normal. There are no pathologic skin lesions.  NEUROLOGIC:  Motor and sensation is grossly normal.  Cranial nerves are grossly intact. PSYCH:  Alert and oriented to person, place and time. Affect is normal.  Laboratory Analysis: Labs from 08/09/2022: Sodium 137, potassium 4.1, chloride 105, CO2 22, BUN 11, creatinine 0.79.  Total bilirubin 0.9, AST 31, ALT 27, alkaline phosphatase 78, albumin 4.7.  WBC 5.6, hemoglobin 16.4, hematocrit 47.9, platelets 239.  Imaging: CT abdomen/pelvis on 07/31/2022: IMPRESSION: 1. No acute abnormality in the abdomen or pelvis. 2.  Umbilical hernia containing a nonobstructed loop of small bowel  Assessment and Plan: This is a 52 y.o. male with an umbilical hernia containing small bowel.  - Discussed with the patient the findings on his CT scan images and that the hernia has been progressing from fat-containing only to now getting a bit bigger and allowing small intestine to bulge through.  Discussed with him that now that there is no intestine involved in the picture, I would recommend proceeding with repair of his hernia so that his bowel does not become strangulated or incarcerated.  Discussed with him that if this were the case, that he would require more emergency surgery which can have more complications and a longer hospital stay.  At this point, we are talking more of an outpatient elective procedure.  The patient is in agreement and would like to discuss with his family more about the exact timing for surgery. - Discussed with the patient the plan for surgery would be a robotic assisted umbilical hernia repair and reviewed the surgery at length with him including the  risks of bleeding, infection, injury to surrounding structures, the planned incisions, that this will be an outpatient surgery, postoperative activity restrictions, pain control, and he is willing to proceed. - Patient will call us once he has decided on a date so that we can schedule him.  All of his questions have been answered.  I spent 60 minutes dedicated to the care of this patient on the date of this encounter to include pre-visit review of records, face-to-face time with the patient discussing diagnosis and management, and any post-visit coordination of care.   [name], MD [facility]

## 2022-08-20 NOTE — Progress Notes (Deleted)
08/20/2022  Reason for Visit:  ***  Requesting Provider:  ***  History of Present Illness: Gabriel Ball is a 52 y.o. male ***.  Past Medical History: Past Medical History:  Diagnosis Date  . Asthma   . Herpes   . Shingles      Past Surgical History: Past Surgical History:  Procedure Laterality Date  . MOUTH SURGERY      Home Medications: Prior to Admission medications   Medication Sig Start Date End Date Taking? Authorizing Provider  carbamide peroxide (DEBROX) 6.5 % OTIC solution Place 5 drops into the right ear 2 (two) times daily as needed. 08/11/22  Yes Bing Neighbors, NP  hydrocortisone (ANUSOL-HC) 25 MG suppository Place 1 suppository (25 mg total) rectally 2 (two) times daily. 05/26/22  Yes Georgian Co M, PA-C  Hydrocortisone Acetate 1 % OINT Apply 1 each topically as needed. 05/26/22  Yes Anders Simmonds, PA-C  hydrOXYzine (VISTARIL) 25 MG capsule Take 1 capsule (25 mg total) by mouth every 8 (eight) hours as needed. For anxiety 05/26/22  Yes McClung, Angela M, PA-C  olopatadine (PATADAY) 0.1 % ophthalmic solution Place 1 drop into both eyes 2 (two) times daily. 05/31/22  Yes Raspet, Erin K, PA-C  polyethylene glycol powder (GLYCOLAX/MIRALAX) 17 GM/SCOOP powder Mix 17 g in 4-8 ounces of water/juice and take by mouth 2 (two) times daily as needed. 07/28/22  Yes Claiborne Rigg, NP  tiZANidine (ZANAFLEX) 4 MG tablet Take 1 tablet (4 mg total) by mouth every 8 (eight) hours as needed for muscle spasms. 05/31/22  Yes Raspet, Noberto Retort, PA-C  valACYclovir (VALTREX) 1000 MG tablet Take 1 tablet (1,000 mg total) by mouth daily. 07/28/22  Yes Claiborne Rigg, NP  potassium chloride SA (KLOR-CON M) 20 MEQ tablet Take 1 tablet (20 mEq total) by mouth 2 (two) times daily for 2 days. 08/01/22 08/04/22  Sponseller, Lupe Carney R, PA-C  SUMAtriptan (IMITREX) 50 MG tablet Take one for headache.  May repeat in 2 hours.  No more than 2 a day 05/27/17 12/09/21  Eustace Moore, MD     Allergies: Allergies  Allergen Reactions  . Cyclobenzaprine     Facial Numbness/dizziness/hypersomnolence  . Ibuprofen Hives  . Methocarbamol     Abdominal pain  . Amoxicillin Itching    Pruritic rash  . Penicillins Palpitations    Has patient had a PCN reaction causing immediate rash, facial/tongue/throat swelling, SOB or lightheadedness with hypotension: No Has patient had a PCN reaction causing severe rash involving mucus membranes or skin necrosis: No Has patient had a PCN reaction that required hospitalization: No Has patient had a PCN reaction occurring within the last 10 years: No If all of the above answers are "NO", then may proceed with Cephalosporin use.    Social History:  reports that he has quit smoking. He has never used smokeless tobacco. He reports that he does not drink alcohol and does not use drugs.   Family History: Family History  Problem Relation Age of Onset  . Cancer Father     Review of Systems: ROS  Physical Exam BP 114/72   Pulse 63   Temp 98.5 F (36.9 C) (Oral)   Wt 176 lb 12.8 oz (80.2 kg)   SpO2 99%   BMI 29.42 kg/m  CONSTITUTIONAL: *** HEENT:  Normocephalic, atraumatic, extraocular motion intact. NECK: Trachea is midline, and there is no jugular venous distension.  RESPIRATORY:  Lungs are clear, and breath sounds are equal bilaterally. Normal respiratory  effort without pathologic use of accessory muscles. CARDIOVASCULAR: Heart is regular without murmurs, gallops, or rubs. GI: The abdomen is ***. There were no palpable masses. There was no hepatosplenomegaly. GU: *** MUSCULOSKELETAL:  Normal muscle strength and tone in all four extremities.  No peripheral edema or cyanosis. SKIN: Skin turgor is normal. There are no pathologic skin lesions.  NEUROLOGIC:  Motor and sensation is grossly normal.  Cranial nerves are grossly intact. PSYCH:  Alert and oriented to person, place and time. Affect is normal.  Laboratory Analysis: No  results found for this or any previous visit (from the past 24 hour(s)).  Imaging: No results found.  Assessment and Plan: This is a 52 y.o. male ***  I spent *** minutes dedicated to the care of this patient on the date of this encounter to include pre-visit review of records, face-to-face time with the patient discussing diagnosis and management, and any post-visit coordination of care.   [name], MD [facility]

## 2022-08-23 ENCOUNTER — Other Ambulatory Visit: Payer: Self-pay | Admitting: Nurse Practitioner

## 2022-08-23 ENCOUNTER — Other Ambulatory Visit: Payer: Self-pay | Admitting: Pharmacist

## 2022-08-23 ENCOUNTER — Ambulatory Visit: Payer: Self-pay | Admitting: Physician Assistant

## 2022-08-23 ENCOUNTER — Other Ambulatory Visit: Payer: Self-pay

## 2022-08-23 DIAGNOSIS — K5904 Chronic idiopathic constipation: Secondary | ICD-10-CM

## 2022-08-23 MED ORDER — POLYETHYLENE GLYCOL 3350 17 GM/SCOOP PO POWD
17.0000 g | Freq: Two times a day (BID) | ORAL | 1 refills | Status: DC | PRN
Start: 2022-08-23 — End: 2022-08-25
  Filled 2022-08-23: qty 510, 15d supply, fill #0

## 2022-08-24 ENCOUNTER — Ambulatory Visit (HOSPITAL_COMMUNITY)
Admission: EM | Admit: 2022-08-24 | Discharge: 2022-08-24 | Disposition: A | Discharge status: Home / Self Care | Payer: Self-pay | Attending: Physician Assistant | Admitting: Physician Assistant

## 2022-08-24 ENCOUNTER — Encounter (HOSPITAL_COMMUNITY): Payer: Self-pay

## 2022-08-24 DIAGNOSIS — L089 Local infection of the skin and subcutaneous tissue, unspecified: Secondary | ICD-10-CM

## 2022-08-24 DIAGNOSIS — Z113 Encounter for screening for infections with a predominantly sexual mode of transmission: Secondary | ICD-10-CM

## 2022-08-24 DIAGNOSIS — K120 Recurrent oral aphthae: Secondary | ICD-10-CM

## 2022-08-24 MED ORDER — NYSTATIN 100000 UNIT/ML MT SUSP
5.0000 mL | Freq: Three times a day (TID) | OROMUCOSAL | 0 refills | Status: DC | PRN
  Filled 2022-08-24: qty 180, 12d supply, fill #0

## 2022-08-24 MED ORDER — MUPIROCIN 2 % EX OINT
1.0000 | TOPICAL_OINTMENT | Freq: Every day | CUTANEOUS | 0 refills | Status: DC
  Filled 2022-08-24: qty 22, 15d supply, fill #0

## 2022-08-24 NOTE — Discharge Instructions (Addendum)
Use Magic mouthwash 3 times a day.  Swish and spit this.  We will contact you if your HSV testing is positive and start Valtrex.  You can use over-the-counter medications including Tylenol and ibuprofen for additional pain relief.  If you have any swelling of her throat, additional lesions, fever, nausea, vomiting you need to be seen immediately.  You have a small pimple in your nose.  Apply mupirocin on a Q-tip and put it in your nose daily.  If this enlarges and you have difficulty breathing, swelling of redness, fever, nausea, vomiting you need to be seen immediately.  We will contact you if your oral swab is positive and we need to arrange treatment.  Utilice el enjuague bucal Magic 3 veces al da.  Agita y escupe esto.  Nos comunicaremos con usted si su prueba de HSV es positiva y comenzaremos con Valtrex.  Puede utilizar medicamentos de [address], incluidos Tylenol e ibuprofeno, para aliviar an ms Chief Technology Officer.  Si tiene hinchazn en la garganta, lesiones adicionales, fiebre, nuseas o vmitos, debe ser atendido de inmediato.  Tienes un pequeo grano en la nariz.  Aplique mupirocina en un hisopo y Producer, television/film/video en la [employer]  Si esto aumenta y tiene dificultad para respirar, hinchazn o enrojecimiento, fiebre, nuseas o vmitos, debe ser atendido de inmediato.  Nos comunicaremos con usted si su hisopo oral es positivo y Oncologist.

## 2022-08-24 NOTE — ED Provider Notes (Signed)
[facility]    CSN: [id number] Arrival date & time: 08/24/22  1530      History   Chief Complaint Chief Complaint  Patient presents with   Facial Pain    [name] is a 52 y.o. male.   Patient presents today with several concerns.  He is Spanish-speaking and video interpreter was utilized during visit.  He reports earlier today he developed a painful lesion in his left nare.  He reports that it is minimally painful as long as he does not touch it but if he does touch it it becomes an 8 on a 0-10 pain scale, described as sharp, no aggravating leaving factors notified.  He denies history of MRSA or recurrent skin infections.  He has not tried any over-the-counter medication for symptom management.  In addition, he reports a lesion on the distal portion of his inferior tongue that has become painful.  He does have a history of HSV and is very concerned that this could be HSV.  He is concerned because he performed oral sex yesterday and wonders if this could have triggered symptoms.  He denies any injury to the area.  He does report an unusual sensation over his tongue as though there is a film or that his mouth is dry.  He is also experiencing a sore throat.  He has not tried any over-the-counter medication for symptom management.  He is eating and drinking normally.  He is interested in STI testing in this area.  He has no concern for genital STIs so declined any additional swabs or testing.  He is anxious to feel better as he is scheduled for hernia surgery 09/02/2022.    Past Medical History:  Diagnosis Date   Asthma    Herpes    Shingles     Patient Active Problem List   Diagnosis Date Noted   Acute bilateral low back pain without sciatica 06/05/2020   Acute pain of right shoulder 06/05/2020   Asthma due to environmental allergies 12/20/2016   HSV (herpes simplex virus) infection 02/24/2015   Anxiety 04/02/2014    Past Surgical History:  Procedure  Laterality Date   MOUTH SURGERY         Home Medications    Prior to Admission medications   Medication Sig Start Date End Date Taking? Authorizing Provider  magic mouthwash (nystatin, lidocaine, diphenhydrAMINE) suspension Take 5 mLs by mouth 3 (three) times daily as needed for mouth pain. Swish and spit 08/24/22  Yes ,  K, PA-C  mupirocin ointment (BACTROBAN) 2 % Apply 1 Application topically daily. Apply in nose daily 08/24/22  Yes ,  K, PA-C  carbamide peroxide (DEBROX) 6.5 % OTIC solution Place 5 drops into the right ear 2 (two) times daily as needed. 08/11/22   Bing Neighbors, NP  hydrocortisone (ANUSOL-HC) 25 MG suppository Place 1 suppository (25 mg total) rectally 2 (two) times daily. 05/26/22   Anders Simmonds, PA-C  Hydrocortisone Acetate 1 % OINT Apply 1 each topically as needed. 05/26/22   Anders Simmonds, PA-C  hydrOXYzine (VISTARIL) 25 MG capsule Take 1 capsule (25 mg total) by mouth every 8 (eight) hours as needed. For anxiety 05/26/22   Anders Simmonds, PA-C  olopatadine (PATADAY) 0.1 % ophthalmic solution Place 1 drop into both eyes 2 (two) times daily. 05/31/22   , Denny Peon K, PA-C  polyethylene glycol powder (GLYCOLAX/MIRALAX) 17 GM/SCOOP powder Mix 17 g in 4-8 ounces of water/juice and take by  mouth 2 (two) times daily as needed. 08/23/22   Hoy Register, MD  potassium chloride SA (KLOR-CON M) 20 MEQ tablet Take 1 tablet (20 mEq total) by mouth 2 (two) times daily for 2 days. 08/01/22 08/04/22  Sponseller, Lupe Carney R, PA-C  tiZANidine (ZANAFLEX) 4 MG tablet Take 1 tablet (4 mg total) by mouth every 8 (eight) hours as needed for muscle spasms. 05/31/22   , Noberto Retort, PA-C  valACYclovir (VALTREX) 1000 MG tablet Take 1 tablet (1,000 mg total) by mouth daily. 07/28/22   Claiborne Rigg, NP  SUMAtriptan (IMITREX) 50 MG tablet Take one for headache.  May repeat in 2 hours.  No more than 2 a day 05/27/17 12/09/21  Eustace Moore, MD    Family  History Family History  Problem Relation Age of Onset   Cancer Father     Social History Social History   Tobacco Use   Smoking status: Former   Smokeless tobacco: Never  Vaping Use   Vaping status: Never Used  Substance Use Topics   Alcohol use: No   Drug use: No     Allergies   Cyclobenzaprine, Ibuprofen, Methocarbamol, Amoxicillin, and Penicillins   Review of Systems Review of Systems  Constitutional:  Positive for activity change. Negative for appetite change, fatigue and fever.  HENT:  Positive for mouth sores (on tongue) and sore throat. Negative for congestion, nosebleeds, postnasal drip, trouble swallowing and voice change.   Respiratory:  Negative for cough and shortness of breath.   Cardiovascular:  Negative for chest pain.  Gastrointestinal:  Negative for abdominal pain, diarrhea, nausea and vomiting.     Physical Exam Triage Vital Signs ED Triage Vitals  Encounter Vitals Group     BP 08/24/22 1641 98/67     Systolic BP Percentile --      Diastolic BP Percentile --      Pulse Rate 08/24/22 1641 83     Resp 08/24/22 1641 17     Temp 08/24/22 1641 99.4 F (37.4 C)     Temp src --      SpO2 08/24/22 1641 98 %     Weight --      Height --      Head Circumference --      Peak Flow --      Pain Score 08/24/22 1640 4     Pain Loc --      Pain Education --      Exclude from Growth Chart --    No data found.  Updated Vital Signs BP 98/67 (BP Location: Right Arm)   Pulse 83   Temp 99.4 F (37.4 C)   Resp 17   SpO2 98%   Visual Acuity Right Eye Distance:   Left Eye Distance:   Bilateral Distance:    Right Eye Near:   Left Eye Near:    Bilateral Near:     Physical Exam Vitals reviewed.  Constitutional:      General: He is awake.     Appearance: Normal appearance. He is well-developed. He is not ill-appearing.     Comments: Very pleasant male appears stated age in no acute distress sitting comfortably in exam room  HENT:     Head:  Normocephalic and atraumatic.     Nose: Nose normal. No rhinorrhea.     Comments: Small pustule noted lateral wall of left nare    Mouth/Throat:     Tongue: Lesions present.     Pharynx: Uvula midline. No  oropharyngeal exudate or posterior oropharyngeal erythema.     Comments: Small ulceration noted inferior distal tongue.  No additional oral lesions noted.  Normal-appearing posterior oropharynx. Cardiovascular:     Rate and Rhythm: Normal rate and regular rhythm.     Heart sounds: Normal heart sounds, S1 normal and S2 normal. No murmur heard. Pulmonary:     Effort: Pulmonary effort is normal.     Breath sounds: Normal breath sounds. No stridor. No wheezing, rhonchi or rales.     Comments: Clear to auscultation bilaterally Genitourinary:    Comments: Exam deferred Neurological:     Mental Status: He is alert.  Psychiatric:        Behavior: Behavior is cooperative.      UC Treatments / Results  Labs (all labs ordered are listed, but only abnormal results are displayed) Labs Reviewed  HSV CULTURE AND TYPING  CYTOLOGY, (ORAL, ANAL, URETHRAL) ANCILLARY ONLY    EKG   Radiology No results found.  Procedures Procedures (including critical care time)  Medications Ordered in UC Medications - No data to display  Initial Impression / Assessment and Plan / UC Course  I have reviewed the triage vital signs and the nursing notes.  Pertinent labs & imaging results that were available during my care of the patient were reviewed by me and considered in my medical decision making (see chart for details).     Patient is well-appearing, afebrile, nontoxic, nontachycardic.  He was noted to have pustule in his nare.  Recommended that he use warm compresses/steam to help manage the symptoms.  Will have him apply mupirocin daily.  Can use over-the-counter analgesics as needed.  Discussed that if this changes or worsens in any way he should return for reevaluation.  Symptoms are  consistent with aphthous ulcer.  This was tested for HSV and if positive will start Valtrex (500 mg twice daily for 3 days)..  Will treat with Magic mouthwash to help manage pain.  Discussed that he is to swish and spit this up to 3 times a day.  He was concern for oral STI and so STI swab was collected of oropharynx.  He declined any additional testing today.  Discussed that if he has any additional or worsening symptoms including additional ulcers, lesions, fever, swelling of his throat, dysphagia, nausea/vomiting he needs to be seen immediately.  Strict return precautions given.  All questions answered to patient satisfaction.  Final Clinical Impressions(s) / UC Diagnoses   Final diagnoses:  Aphthous ulcer of tongue  Pustule of nostril  Screening examination for STI     Discharge Instructions      Use Magic mouthwash 3 times a day.  Swish and spit this.  We will contact you if your HSV testing is positive and start Valtrex.  You can use over-the-counter medications including Tylenol and ibuprofen for additional pain relief.  If you have any swelling of her throat, additional lesions, fever, nausea, vomiting you need to be seen immediately.  You have a small pimple in your nose.  Apply mupirocin on a Q-tip and put it in your nose daily.  If this enlarges and you have difficulty breathing, swelling of redness, fever, nausea, vomiting you need to be seen immediately.  We will contact you if your oral swab is positive and we need to arrange treatment.  Utilice el enjuague bucal Magic 3 veces al da.  Agita y escupe esto.  Nos comunicaremos con usted si su prueba de HSV es positiva y comenzaremos  con Valtrex.  Puede utilizar medicamentos de [address], incluidos Tylenol e ibuprofeno, para aliviar an ms Chief Technology Officer.  Si tiene hinchazn en la garganta, lesiones adicionales, fiebre, nuseas o vmitos, debe ser atendido de inmediato.  Tienes un pequeo grano en la nariz.  Aplique mupirocina en un  hisopo y Producer, television/film/video en la [employer]  Si esto aumenta y tiene dificultad para respirar, hinchazn o enrojecimiento, fiebre, nuseas o vmitos, debe ser atendido de inmediato.  Nos comunicaremos con usted si su hisopo oral es positivo y Oncologist.     ED Prescriptions     Medication Sig Dispense Auth. Provider   mupirocin ointment (BACTROBAN) 2 % Apply 1 Application topically daily. Apply in nose daily 22 g ,  K, PA-C   magic mouthwash (nystatin, lidocaine, diphenhydrAMINE) suspension Take 5 mLs by mouth 3 (three) times daily as needed for mouth pain. Swish and spit 180 mL ,  K, PA-C      PDMP not reviewed this encounter.   Jeani Hawking, PA-C 08/24/22 1742

## 2022-08-24 NOTE — ED Triage Notes (Signed)
Pt presents with nose pain X 1 day. Pt states he had oral sex yesterday and states he has dry mouth and soreness in his nose and mouth.

## 2022-08-25 ENCOUNTER — Other Ambulatory Visit: Payer: Self-pay

## 2022-08-25 ENCOUNTER — Other Ambulatory Visit: Payer: Self-pay | Admitting: Nurse Practitioner

## 2022-08-25 ENCOUNTER — Telehealth: Payer: Self-pay | Admitting: Surgery

## 2022-08-25 ENCOUNTER — Ambulatory Visit: Payer: Self-pay | Attending: Nurse Practitioner | Admitting: Nurse Practitioner

## 2022-08-25 ENCOUNTER — Encounter: Payer: Self-pay | Admitting: Nurse Practitioner

## 2022-08-25 DIAGNOSIS — K5904 Chronic idiopathic constipation: Secondary | ICD-10-CM

## 2022-08-25 MED ORDER — POLYETHYLENE GLYCOL 3350 17 GM/SCOOP PO POWD
17.0000 g | Freq: Two times a day (BID) | ORAL | 3 refills | Status: DC | PRN
Start: 2022-08-25 — End: 2022-09-17

## 2022-08-25 NOTE — Telephone Encounter (Signed)
Spoke with daughter, Merdis Delay and patient.    Patient has been advised of Pre-Admission date/time, and Surgery date at [facility].  Surgery Date: 09/02/22 Preadmission Testing Date: 08/27/22 (phone 1p-4p)  Patient has been made aware to call [phone], between 1-3:00pm the day before surgery, to find out what time to arrive for surgery.

## 2022-08-25 NOTE — Progress Notes (Signed)
  Gabriel Ball thought his potassium was low. Last labs show normal potassium. Does not need appt

## 2022-08-26 ENCOUNTER — Emergency Department (HOSPITAL_COMMUNITY)
Admission: EM | Admit: 2022-08-26 | Discharge: 2022-08-26 | Disposition: A | Discharge status: Home / Self Care | Payer: Self-pay | Attending: Emergency Medicine | Admitting: Emergency Medicine

## 2022-08-26 ENCOUNTER — Other Ambulatory Visit: Payer: Self-pay

## 2022-08-26 DIAGNOSIS — B001 Herpesviral vesicular dermatitis: Secondary | ICD-10-CM | POA: Insufficient documentation

## 2022-08-26 DIAGNOSIS — J45909 Unspecified asthma, uncomplicated: Secondary | ICD-10-CM | POA: Insufficient documentation

## 2022-08-26 MED ORDER — VALACYCLOVIR HCL 1 G PO TABS: 500.0000 mg | ORAL_TABLET | Freq: Two times a day (BID) | ORAL | 0 refills | Status: AC

## 2022-08-26 NOTE — Discharge Instructions (Addendum)
Thank you for letting us take care of you today.  I have prescribed an anti-viral to treat you for a possible herpes infection.  Take this as prescribed.  You may also continue the medications prescribed in urgent care.  Follow-up with your PCP for any continued symptoms.  For new or worsening symptoms including difficulty swallowing or breathing, return to the nearest ED for reevaluation.  Gracias por dejarnos cuidar de usted hoy.  Le he recetado un antiviral para tratarle una posible infeccin por herpes.  Tmelo segn lo prescrito.  Tambin puede continuar con los medicamentos recetados en atencin de [place].  Haga un seguimiento con su PCP si persisten los sntomas.  Si los sntomas nuevos o empeoran, incluida la dificultad para tragar o Industrial/product designer, regrese al servicio de urgencias ms cercano para una reevaluacin.

## 2022-08-26 NOTE — ED Provider Notes (Signed)
[place] EMERGENCY DEPARTMENT AT [facility] Provider Note   CSN: [id number] Arrival date & time: 08/26/22  1603  [id number]    History  Chief Complaint  Patient presents with   Sore Throat    Gabriel Ball is a 52 y.o. male with PMH asthma who presents to ED c/o lesions to his mouth that started after having oral sex with his partner earlier this week.  No fevers, dyspnea, difficulty swallowing.  He was seen at urgent care and given Magic mouthwash and mupirocin ointment but has not had any improvement on this.  Does have a history of previous herpes that improved with Valtrex treatment.  No genital symptoms.  No fevers at home.  Of note, he is scheduled for surgery next week and would like to have symptoms fully treated before undergoing the surgery.      Home Medications Prior to Admission medications   Medication Sig Start Date End Date Taking? Authorizing Provider  valACYclovir (VALTREX) 1000 MG tablet Take 0.5 tablets (500 mg total) by mouth 2 (two) times daily for 5 days. 08/26/22 08/31/22 Yes ,  L, PA-C  carbamide peroxide (DEBROX) 6.5 % OTIC solution Place 5 drops into the right ear 2 (two) times daily as needed. 08/11/22   Bing Neighbors, NP  hydrocortisone (ANUSOL-HC) 25 MG suppository Place 1 suppository (25 mg total) rectally 2 (two) times daily. 05/26/22   Anders Simmonds, PA-C  Hydrocortisone Acetate 1 % OINT Apply 1 each topically as needed. 05/26/22   Anders Simmonds, PA-C  hydrOXYzine (VISTARIL) 25 MG capsule Take 1 capsule (25 mg total) by mouth every 8 (eight) hours as needed. For anxiety 05/26/22   Anders Simmonds, PA-C  magic mouthwash (nystatin, lidocaine, diphenhydrAMINE) suspension Take 5 mLs by mouth 3 (three) times daily as needed for mouth pain. Swish and spit 08/24/22   Raspet, Erin K, PA-C  mupirocin ointment (BACTROBAN) 2 % Apply a pea sized amount in the nostrils daily 08/24/22   Raspet, Erin K, PA-C  olopatadine (PATADAY) 0.1 %  ophthalmic solution Place 1 drop into both eyes 2 (two) times daily. 05/31/22   Raspet, Noberto Retort, PA-C  polyethylene glycol powder (GLYCOLAX/MIRALAX) 17 GM/SCOOP powder Mix 17 g in 4-8 ounces of water/juice and take by mouth 2 (two) times daily as needed. 08/25/22   Claiborne Rigg, NP  potassium chloride SA (KLOR-CON M) 20 MEQ tablet Take 1 tablet (20 mEq total) by mouth 2 (two) times daily for 2 days. 08/01/22 08/04/22  Sponseller, Lupe Carney R, PA-C  tiZANidine (ZANAFLEX) 4 MG tablet Take 1 tablet (4 mg total) by mouth every 8 (eight) hours as needed for muscle spasms. 05/31/22   Raspet, Noberto Retort, PA-C  SUMAtriptan (IMITREX) 50 MG tablet Take one for headache.  May repeat in 2 hours.  No more than 2 a day 05/27/17 12/09/21  Eustace Moore, MD      Allergies    Cyclobenzaprine, Ibuprofen, Methocarbamol, Amoxicillin, and Penicillins    Review of Systems   Review of Systems  All other systems reviewed and are negative.   Physical Exam Updated Vital Signs BP 121/64 (BP Location: Left Arm)   Pulse 76   Temp 98.6 F (37 C) (Oral)   Resp 16   Ht 5\' 5"  (1.651 m)   Wt 82 kg   SpO2 99%   BMI 30.08 kg/m  Physical Exam Vitals and nursing note reviewed.  Constitutional:      General: He is not  in acute distress.    Appearance: Normal appearance.  HENT:     Head: Normocephalic and atraumatic.     Mouth/Throat:     Comments: Multiple pinpoint ulcerations intraorally with mild surrounding erythema, no clustering, no drainage, slightly tender to the palpation, widely patent airway without pharyngeal erythema or swelling, normal phonation Eyes:     Conjunctiva/sclera: Conjunctivae normal.  Cardiovascular:     Rate and Rhythm: Normal rate and regular rhythm.     Heart sounds: No murmur heard. Pulmonary:     Effort: Pulmonary effort is normal.     Breath sounds: Normal breath sounds.  Abdominal:     General: Abdomen is flat.     Palpations: Abdomen is soft.     Tenderness: There is no abdominal  tenderness.  Musculoskeletal:        General: Normal range of motion.     Cervical back: Neck supple.     Right lower leg: No edema.     Left lower leg: No edema.  Skin:    General: Skin is warm and dry.     Capillary Refill: Capillary refill takes less than 2 seconds.  Neurological:     Mental Status: He is alert. Mental status is at baseline.  Psychiatric:        Behavior: Behavior normal.     ED Results / Procedures / Treatments   Labs (all labs ordered are listed, but only abnormal results are displayed) Labs Reviewed - No data to display  EKG None  Radiology No results found.  Procedures Procedures    Medications Ordered in ED Medications - No data to display  ED Course/ Medical Decision Making/ A&P                                 Medical Decision Making Risk Prescription drug management.   Medical Decision Making:   [name] is a 52 y.o. male who presented to the ED today with oral ulcerations detailed above.    Patient's presentation is complicated by their history of recent sexual encounter.  Complete initial physical exam performed, notably the patient was in no acute distress, widely patent airway, normal phonation.  Had multiple intraoral lesions.    Reviewed and confirmed nursing documentation for past medical history, family history, social history.    Initial Assessment:   With the patient's presentation, differential diagnosis includes but is not limited to aphthous ulcer, herpes, gonorrhea, chlamydia, strep pharyngitis, viral pharyngitis. This is most consistent with an acute complicated illness  Initial Plan:  Objective evaluation as below reviewed   Final Assessment and Plan:   52 year old male presents to the ED complaining of intraoral ulcerations since having unprotected oral sex with his partner earlier this week.  Being treated with Magic mouthwash and mupirocin ointment by urgent care.  Reviewed both swabs for gonorrhea and  chlamydia at this appointment earlier this week and they were both negative.  No GU symptoms.  No fevers. He does have multiple non-clustered pinpoint lesions with mild erythema, not draining. No pharyngeal swelling, patent airway, normal phonation. No tonsillar exudates. Does not appear consistent with strep pharyngitis. HSV swab was completed at urgent care and is pending. Does not appear to be classical presentation of sexually transmitted herpes but in extensive discussion with pt he is scheduled to undergo hernia repair late next week and would like infection cleared before then. Due to this, we will  trial anti-virals and pt agreeable with this plan. Would like to trial medication and be discharged, uncertain of immune status, no known diagnosed immunocompromised status but given unprotected sexual encounters will treat with 5 day course and have pt follow up with PCP for HIV testing, further management. Strict ED return precautions given, all questions answered, and stable for discharge.    Clinical Impression:  1. Herpes labialis      Discharge           Final Clinical Impression(s) / ED Diagnoses Final diagnoses:  Herpes labialis    Rx / DC Orders ED Discharge Orders          Ordered    valACYclovir (VALTREX) 1000 MG tablet  2 times daily        08/26/22 1721              Richardson Dopp 08/26/22 1732    Lorre Nick, MD 08/26/22 2250

## 2022-08-26 NOTE — ED Triage Notes (Signed)
Pt reports sore throat and dry mouth and nose. Pt states symptoms started 3 days ago after having oral sex with new partner. Was tested for stds at UC 2 days ago

## 2022-08-27 ENCOUNTER — Encounter: Payer: Self-pay | Admitting: Surgery

## 2022-08-27 ENCOUNTER — Encounter
Admission: RE | Admit: 2022-08-27 | Discharge: 2022-08-27 | Disposition: A | Discharge status: Home / Self Care | Payer: Self-pay | Source: Ambulatory Visit | Attending: Surgery | Admitting: Surgery

## 2022-08-27 HISTORY — DX: Umbilical hernia without obstruction or gangrene: K42.9

## 2022-08-27 HISTORY — DX: Anxiety disorder, unspecified: F41.9

## 2022-08-27 NOTE — Patient Instructions (Addendum)
Your procedure is scheduled on: 09/02/2022 Thursday  Report to the Registration Desk on the 1st floor of the [facility]. To find out your arrival time, please call [phone] between 1PM - 3PM on: 8/14/ 2024 Wednesday If your arrival time is 6:00 am, do not arrive before that time as the [facility] entrance doors do not open until 6:00 am.  REMEMBER: Instructions that are not followed completely may result in serious medical risk, up to and including death; or upon the discretion of your surgeon and anesthesiologist your surgery may need to be rescheduled.  Do not eat food after midnight the night before surgery.  No gum chewing or hard candies.    One week prior to surgery: Stop Anti-inflammatories (NSAIDS) such as Advil, Aleve, Ibuprofen, Motrin, Naproxen, Naprosyn and Aspirin based products such as Excedrin, Goody's Powder, BC Powder. Stop ANY OVER THE COUNTER supplements until after surgery. You may however, continue to take Tylenol if needed for pain up until the day of surgery.  Continue taking all prescribed medications.    No Alcohol for 24 hours before or after surgery.  No Smoking including e-cigarettes for 24 hours before surgery.  No chewable tobacco products for at least 6 hours before surgery.  No nicotine patches on the day of surgery.  Do not use any "recreational" drugs for at least a week (preferably 2 weeks) before your surgery.  Please be advised that the combination of cocaine and anesthesia may have negative outcomes, up to and including death. If you test positive for cocaine, your surgery will be cancelled.  On the morning of surgery brush your teeth with toothpaste and water, you may rinse your mouth with mouthwash if you wish. Do not swallow any toothpaste or mouthwash.  Use CHG Soap  as directed on instruction sheet. -Given to you. This is important  to decrease skin bacteria that causes surgical site infection.  Do not wear jewelry, make-up,  hairpins, clips or nail polish.  Do not wear lotions, powders, or perfumes.   Do not shave body hair from the neck down 48 hours before surgery.  Contact lenses, hearing aids and dentures may not be worn into surgery.  Do not bring valuables to the hospital. [facility] is not responsible for any missing/lost belongings or valuables.   Notify your doctor if there is any change in your medical condition (cold, fever, infection).  Wear comfortable clothing (specific to your surgery type) to the hospital.  After surgery, you can help prevent lung complications by doing breathing exercises.  Take deep breaths and cough every 1-2 hours. Your doctor may order a device called an Incentive Spirometer to help you take deep breaths. When coughing or sneezing, hold a pillow firmly against your incision with both hands. This is called "splinting." Doing this helps protect your incision. It also decreases belly discomfort.  If you are being admitted to the hospital overnight, leave your suitcase in the car. After surgery it may be brought to your room.  In case of increased patient census, it may be necessary for you, the patient, to continue your postoperative care in the Same Day Surgery department.  If you are being discharged the day of surgery, you will not be allowed to drive home. You will need a responsible individual to drive you home and stay with you for 24 hours after surgery.   If you are taking public transportation, you will need to have a responsible individual with you.  Please call the  Pre-admissions Testing Dept. at [phone] if you have any questions about these instructions.  Surgery Visitation Policy:  Patients having surgery or a procedure may have two visitors.  Children under the age of 33 must have an adult with them who is not the patient.        Preparing for Surgery with CHLORHEXIDINE GLUCONATE (CHG) Soap  Chlorhexidine Gluconate (CHG) Soap  o An  antiseptic cleaner that kills germs and bonds with the skin to continue killing germs even after washing  o Used for showering the night before surgery and morning of surgery  Before surgery, you can play an important role by reducing the number of germs on your skin.  CHG (Chlorhexidine gluconate) soap is an antiseptic cleanser which kills germs and bonds with the skin to continue killing germs even after washing.  Please do not use if you have an allergy to CHG or antibacterial soaps. If your skin becomes reddened/irritated stop using the CHG.  1. Shower the NIGHT BEFORE SURGERY and the MORNING OF SURGERY with CHG soap.  2. If you choose to wash your hair, wash your hair first as usual with your normal shampoo.  3. After shampooing, rinse your hair and body thoroughly to remove the shampoo.  4. Use CHG as you would any other liquid soap. You can apply CHG directly to the skin and wash gently with a scrungie or a clean washcloth.  5. Apply the CHG soap to your body only from the neck down. Do not use on open wounds or open sores. Avoid contact with your eyes, ears, mouth, and genitals (private parts). Wash face and genitals (private parts) with your normal soap.  6. Wash thoroughly, paying special attention to the area where your surgery will be performed.  7. Thoroughly rinse your body with warm water.  8. Do not shower/wash with your normal soap after using and rinsing off the CHG soap.  9. Pat yourself dry with a clean towel.  10. Wear clean pajamas to bed the night before surgery.  12. Place clean sheets on your bed the night of your first shower and do not sleep with pets.  13. Shower again with the CHG soap on the day of surgery prior to arriving at the hospital.  14. Do not apply any deodorants/lotions/powders.  15. Please wear clean clothes to the hospital.

## 2022-08-27 NOTE — Pre-Procedure Instructions (Addendum)
Pre -op interview completed and patient instructions given to patient using an interpreter with ID number [id number].  Patient instructed to pick up the printed instructions as well and the CHG soap.  Patient verbalized understanding. No issues noted.

## 2022-08-29 ENCOUNTER — Encounter (HOSPITAL_COMMUNITY): Payer: Self-pay

## 2022-08-29 ENCOUNTER — Emergency Department (HOSPITAL_COMMUNITY)
Admission: EM | Admit: 2022-08-29 | Discharge: 2022-08-29 | Disposition: A | Discharge status: Home / Self Care | Payer: Self-pay | Attending: Emergency Medicine | Admitting: Emergency Medicine

## 2022-08-29 ENCOUNTER — Other Ambulatory Visit: Payer: Self-pay

## 2022-08-29 DIAGNOSIS — Z1152 Encounter for screening for COVID-19: Secondary | ICD-10-CM | POA: Insufficient documentation

## 2022-08-29 DIAGNOSIS — J45909 Unspecified asthma, uncomplicated: Secondary | ICD-10-CM | POA: Insufficient documentation

## 2022-08-29 DIAGNOSIS — J069 Acute upper respiratory infection, unspecified: Secondary | ICD-10-CM | POA: Insufficient documentation

## 2022-08-29 LAB — GROUP A STREP BY PCR: Group A Strep by PCR: NOT DETECTED

## 2022-08-29 LAB — RESP PANEL BY RT-PCR (RSV, FLU A&B, COVID)  RVPGX2
Influenza A by PCR: NEGATIVE
Influenza B by PCR: NEGATIVE
Resp Syncytial Virus by PCR: NEGATIVE
SARS Coronavirus 2 by RT PCR: NEGATIVE

## 2022-08-29 NOTE — ED Provider Notes (Signed)
EMERGENCY DEPARTMENT AT [facility] Provider Note   CSN: [id number] Arrival date & time: 08/29/22  1016     History  Chief Complaint  Patient presents with   Sore Throat    Gabriel Ball is a 52 y.o. male with past medical history of asthma presents with concern for a sore throat and dry cough has been ongoing for the past 4 days.  States it hurts his throat to swallow.  Denies any difficulty swallowing.  Denies any fevers or chills, shortness of breath, sputum production.  Denies any sick contacts.   Sore Throat       Home Medications Prior to Admission medications   Medication Sig Start Date End Date Taking? Authorizing Provider  hydrocortisone (ANUSOL-HC) 25 MG suppository Place 1 suppository (25 mg total) rectally 2 (two) times daily. 05/26/22   Anders Simmonds, PA-C  Hydrocortisone Acetate 1 % OINT Apply 1 each topically as needed. 05/26/22   Anders Simmonds, PA-C  hydrOXYzine (VISTARIL) 25 MG capsule Take 1 capsule (25 mg total) by mouth every 8 (eight) hours as needed. For anxiety 05/26/22   Anders Simmonds, PA-C  magic mouthwash (nystatin, lidocaine, diphenhydrAMINE) suspension Take 5 mLs by mouth 3 (three) times daily as needed for mouth pain. Swish and spit 08/24/22   Raspet, Erin K, PA-C  mupirocin ointment (BACTROBAN) 2 % Apply a pea sized amount in the nostrils daily 08/24/22   Raspet, Erin K, PA-C  olopatadine (PATADAY) 0.1 % ophthalmic solution Place 1 drop into both eyes 2 (two) times daily. 05/31/22   Raspet, Noberto Retort, PA-C  polyethylene glycol powder (GLYCOLAX/MIRALAX) 17 GM/SCOOP powder Mix 17 g in 4-8 ounces of water/juice and take by mouth 2 (two) times daily as needed. 08/25/22   Claiborne Rigg, NP  tiZANidine (ZANAFLEX) 4 MG tablet Take 1 tablet (4 mg total) by mouth every 8 (eight) hours as needed for muscle spasms. 05/31/22   Raspet, Noberto Retort, PA-C  valACYclovir (VALTREX) 1000 MG tablet Take 0.5 tablets (500 mg total) by mouth 2 (two)  times daily for 5 days. 08/26/22 08/31/22  Gowens, Mariah L, PA-C  SUMAtriptan (IMITREX) 50 MG tablet Take one for headache.  May repeat in 2 hours.  No more than 2 a day 05/27/17 12/09/21  Eustace Moore, MD      Allergies    Cyclobenzaprine, Ibuprofen, Methocarbamol, Amoxicillin, and Penicillins    Review of Systems   Review of Systems  HENT:  Positive for sore throat.     Physical Exam Updated Vital Signs BP 110/73 (BP Location: Right Arm)   Pulse 85   Temp 98.8 F (37.1 C) (Oral)   Resp 16   Ht 5\' 5"  (1.651 m)   Wt 82 kg   SpO2 96%   BMI 30.08 kg/m  Physical Exam Vitals and nursing note reviewed.  Constitutional:      General: He is not in acute distress.    Appearance: Normal appearance. He is not toxic-appearing.  HENT:     Head: Atraumatic.     Mouth/Throat:     Comments: Mucous membranes slightly dry. No erythema, exudate on tonsils bilaterally Posterior oropharynx with no erythema Neck:     Comments: No lymphadenopathy Cardiovascular:     Rate and Rhythm: Normal rate and regular rhythm.  Pulmonary:     Effort: Pulmonary effort is normal. No respiratory distress.     Breath sounds: Normal breath sounds.     Comments: Patient with  mild dry cough, breathing comfortably on room air No adventitious lung sounds Abdominal:     General: Abdomen is flat. Bowel sounds are normal.     Palpations: Abdomen is soft.     Tenderness: There is no abdominal tenderness.  Skin:    General: Skin is warm and dry.  Neurological:     General: No focal deficit present.     Mental Status: He is alert.  Psychiatric:        Mood and Affect: Mood normal.        Behavior: Behavior normal.     ED Results / Procedures / Treatments   Labs (all labs ordered are listed, but only abnormal results are displayed) Labs Reviewed  RESP PANEL BY RT-PCR (RSV, FLU A&B, COVID)  RVPGX2  GROUP A STREP BY PCR    EKG None  Radiology No results found.  Procedures Procedures     Medications Ordered in ED Medications - No data to display  ED Course/ Medical Decision Making/ A&P                                 Medical Decision Making  52 y.o. male with pertinent past medical history of asthma presents to the ED for concern of dry cough and sore throat for 4 days  Differential diagnosis includes but is not limited to COVID, strep pharyngitis, viral pharyngitis, URI, pneumonia  ED Course:  Patient presents with concern for sore throat and dry cough for the past 4 days.  He is well-appearing in the exam room.  Vital signs all within normal limits.  He denies any fevers or chills, no sputum production, no adventitious lung sounds, low suspicion for pneumonia at this time.  Suspect with his sore throat, this may be a viral pharyngitis, URI,  or COVID.  Low suspicion for strep given he has a cough and no exudate, no cervical lymphadenopathy.  However, patient would like to be tested for this. Will also test for COVID. Strep throat and COVID-negative today.  Suspect symptoms are due to a viral URI.  Discussed this with patient and he has time to run its course.  He may continue to use over-the-counter medications as needed for with some control for sore throat and cough. Appropriate for discharge at this time.    Impression: Viral URI   Disposition:  Patient discharged home with instructions to continue using over-the-counter medications as needed for sore throat and cough.  He understands that this is likely a virus that will need time to resolve. Return precautions given.  Lab Tests: I Ordered, and personally interpreted labs.  The pertinent results include:   COVID and strep negative   External records from outside source obtained and reviewed including ER note from recent visit 8/8   Co morbidities that complicate the patient evaluation  asthma             Final Clinical Impression(s) / ED Diagnoses Final diagnoses:  Viral URI with cough     Rx / DC Orders ED Discharge Orders     None         Arabella Merles, PA-C 08/29/22 1215    Margarita Grizzle, MD 08/29/22 1513

## 2022-08-29 NOTE — ED Triage Notes (Signed)
Patient said a few days ago he was not feeling well, has a sore throat and feels like his nose is stuffy. Today is now coughing a lot. Nonproductive cough.

## 2022-08-29 NOTE — Discharge Instructions (Addendum)
  Hoy diste negativo en la prueba de faringitis estreptoccica y de Covid.  Es probable que sus sntomas se deban a un virus.  Esto necesita tiempo para seguir su curso y Environmental consultant a mejorar durante la prxima semana.  Puede utilizar medicamentos de venta libre para Acupuncturist dolor de garganta y la tos segn sea necesario.   Regrese a la sala de emergencias si tiene dificultad para respirar, fiebre que no se resuelve con Tylenol, escalofros o cualquier otro sntoma nuevo o preocupante.   You tested negative for Strep throat and Covid today.  Your symptoms are likely due to a virus.  This needs time to run its course and will likely start to improve within the next week.  You can use over-the-counter medications to help with your sore throat and cough as needed.   Please return to the ER if you have any difficulty breathing, fever that does not resolve with Tylenol, chills, any other new or concerning symptoms.

## 2022-08-30 ENCOUNTER — Telehealth: Payer: Self-pay | Admitting: Surgery

## 2022-08-30 NOTE — Telephone Encounter (Signed)
Patient stopped by the clinic today.  He went to the ED yesterday and has a viral URI.  He cancelled his surgery for 09/02/22, did not want to reschedule at this time.  He insisted on seeing Dr. Aleen Campi first in the clinic prior to rescheduling his surgery.

## 2022-09-02 ENCOUNTER — Encounter: Admission: RE | Payer: Self-pay | Source: Home / Self Care

## 2022-09-02 ENCOUNTER — Ambulatory Visit: Admission: RE | Admit: 2022-09-02 | Payer: Self-pay | Source: Home / Self Care | Admitting: Surgery

## 2022-09-02 HISTORY — DX: Gastro-esophageal reflux disease without esophagitis: K21.9

## 2022-09-02 SURGERY — XI ROBOT ASSISTED UMBILICAL HERNIA REPAIR
Anesthesia: General

## 2022-09-03 ENCOUNTER — Encounter (HOSPITAL_COMMUNITY): Payer: Self-pay

## 2022-09-03 ENCOUNTER — Ambulatory Visit (HOSPITAL_COMMUNITY)
Admission: EM | Admit: 2022-09-03 | Discharge: 2022-09-03 | Disposition: A | Discharge status: Home / Self Care | Payer: Self-pay

## 2022-09-03 DIAGNOSIS — B002 Herpesviral gingivostomatitis and pharyngotonsillitis: Secondary | ICD-10-CM

## 2022-09-03 DIAGNOSIS — K0510 Chronic gingivitis, plaque induced: Secondary | ICD-10-CM

## 2022-09-03 NOTE — ED Provider Notes (Signed)
[facility]    CSN: [id number] Arrival date & time: 09/03/22  4098      History   Chief Complaint No chief complaint on file.   [name] is a 52 y.o. male.   Presents to clinic requesting clarification of his Valtrex prescription. He has brought three prescriptions to the clinic.   Feels pain around his gums when he flosses. He has not been able to get into his dentist as there is only one at the practice he goes to and they stay booked out.   Reports herpes simplex outbreak has never went away, he has been taking Valtrex for 7 days now. Initially was breaking the 1,000 mg tablet in half and taking this once daily, then switched to the whole tablet.   Patient is not immunocompromised.   Spanish medical interpreter used for this visit.  The history is provided by the patient and medical records. The history is limited by a language barrier. A language interpreter was used.    Past Medical History:  Diagnosis Date   Anxiety    Asthma    GERD (gastroesophageal reflux disease)    Herpes    Shingles    Umbilical hernia     Patient Active Problem List   Diagnosis Date Noted   Acute bilateral low back pain without sciatica 06/05/2020   Acute pain of right shoulder 06/05/2020   Asthma due to environmental allergies 12/20/2016   HSV (herpes simplex virus) infection 02/24/2015   Anxiety 04/02/2014    Past Surgical History:  Procedure Laterality Date   MOUTH SURGERY         Home Medications    Prior to Admission medications   Medication Sig Start Date End Date Taking? Authorizing Provider  hydrocortisone (ANUSOL-HC) 25 MG suppository Place 1 suppository (25 mg total) rectally 2 (two) times daily. 05/26/22   Anders Simmonds, PA-C  Hydrocortisone Acetate 1 % OINT Apply 1 each topically as needed. 05/26/22   Anders Simmonds, PA-C  hydrOXYzine (VISTARIL) 25 MG capsule Take 1 capsule (25 mg total) by mouth every 8 (eight) hours as needed. For  anxiety 05/26/22   Anders Simmonds, PA-C  magic mouthwash (nystatin, lidocaine, diphenhydrAMINE) suspension Take 5 mLs by mouth 3 (three) times daily as needed for mouth pain. Swish and spit 08/24/22   Raspet, Erin K, PA-C  mupirocin ointment (BACTROBAN) 2 % Apply a pea sized amount in the nostrils daily 08/24/22   Raspet, Erin K, PA-C  olopatadine (PATADAY) 0.1 % ophthalmic solution Place 1 drop into both eyes 2 (two) times daily. 05/31/22   Raspet, Noberto Retort, PA-C  polyethylene glycol powder (GLYCOLAX/MIRALAX) 17 GM/SCOOP powder Mix 17 g in 4-8 ounces of water/juice and take by mouth 2 (two) times daily as needed. 08/25/22   Claiborne Rigg, NP  tiZANidine (ZANAFLEX) 4 MG tablet Take 1 tablet (4 mg total) by mouth every 8 (eight) hours as needed for muscle spasms. 05/31/22   Raspet, Noberto Retort, PA-C  SUMAtriptan (IMITREX) 50 MG tablet Take one for headache.  May repeat in 2 hours.  No more than 2 a day 05/27/17 12/09/21  Eustace Moore, MD    Family History Family History  Problem Relation Age of Onset   Cancer Father     Social History Social History   Tobacco Use   Smoking status: Former   Smokeless tobacco: Never  Vaping Use   Vaping status: Never Used  Substance Use Topics  Alcohol use: No   Drug use: No     Allergies   Cyclobenzaprine, Ibuprofen, Methocarbamol, Amoxicillin, and Penicillins   Review of Systems Review of Systems  Constitutional:  Negative for fever.  HENT:  Positive for mouth sores. Negative for dental problem.      Physical Exam Triage Vital Signs ED Triage Vitals  Encounter Vitals Group     BP 09/03/22 0839 100/65     Systolic BP Percentile --      Diastolic BP Percentile --      Pulse Rate 09/03/22 0839 62     Resp 09/03/22 0839 16     Temp 09/03/22 0839 98.2 F (36.8 C)     Temp Source 09/03/22 0839 Oral     SpO2 09/03/22 0839 97 %     Weight --      Height --      Head Circumference --      Peak Flow --      Pain Score 09/03/22 0844 0      Pain Loc --      Pain Education --      Exclude from Growth Chart --    No data found.  Updated Vital Signs BP 100/65 (BP Location: Left Arm)   Pulse 62   Temp 98.2 F (36.8 C) (Oral)   Resp 16   SpO2 97%   Visual Acuity Right Eye Distance:   Left Eye Distance:   Bilateral Distance:    Right Eye Near:   Left Eye Near:    Bilateral Near:     Physical Exam Vitals and nursing note reviewed.  Constitutional:      Appearance: Normal appearance.  HENT:     Head: Normocephalic and atraumatic.     Right Ear: External ear normal.     Left Ear: External ear normal.     Nose: Nose normal.     Mouth/Throat:     Lips: Pink. No lesions.     Mouth: Mucous membranes are moist. No oral lesions.     Dentition: Gingival swelling present.     Comments: Gingival swelling and erythema to anterior lower teeth. No obvious oral lesions on exam. No lip lesions.  Eyes:     Conjunctiva/sclera: Conjunctivae normal.  Cardiovascular:     Rate and Rhythm: Normal rate.  Pulmonary:     Effort: Pulmonary effort is normal. No respiratory distress.  Musculoskeletal:        General: Normal range of motion.     Cervical back: Normal range of motion.  Skin:    General: Skin is warm and dry.  Neurological:     General: No focal deficit present.     Mental Status: He is alert and oriented to person, place, and time.  Psychiatric:        Mood and Affect: Mood normal.        Behavior: Behavior normal. Behavior is cooperative.      UC Treatments / Results  Labs (all labs ordered are listed, but only abnormal results are displayed) Labs Reviewed - No data to display  EKG   Radiology No results found.  Procedures Procedures (including critical care time)  Medications Ordered in UC Medications - No data to display  Initial Impression / Assessment and Plan / UC Course  I have reviewed the triage vital signs and the nursing notes.  Pertinent labs & imaging results that were available  during my care of the patient were reviewed by me and  considered in my medical decision making (see chart for details).  Vitals and triage reviewed, patient is hemodynamically stable.  No lesions on lip or on cheeks.  There is gingival erythema and irritation on lower front teeth.  Advised warm saline rinses and dental follow-up for gingivitis.  Discussed proper usage of Valtrex for repeat herpes simplex flare.  Plan of care, follow-up care return precautions given, no questions at this time.  Dental resources provided.    Final Clinical Impressions(s) / UC Diagnoses   Final diagnoses:  Gingivitis  Oral herpes simplex infection     Discharge Instructions      Asegrese de cepillarse los [employer]  veces al da y usar hilo dental con regularidad. Enjuguese con agua tibia con solucin salina despus de cepillarse los dientes (es decir, agua tibia con sal mezclada) y escpala.  Si vuelve a tener un brote de herpes, tome 1000 mg de Valtrex una vez al da durante 5 [place].  Please ensure you are brushing your teeth twice daily and flossing regularly. Rinse with warm saline water after brushing, this is warm water with salt mixed in, then spit this out.   If you get another herpes outbreak take 1,000 mg of Valtrex once daily for 5 days.   [facility] [place] Address: [address] [place], [place], [place] [address] Phone: [phone]  [facility] [phone] extension [phone] [address]  Dr. Lawrence Marseilles [phone] [address]  [place] [phone] [address] [facility].  [facility] [phone] extension [address] [place], [place], [address] First come first serve for the first 10 clients.  May do simple extractions only, no wisdom teeth or surgery.  You may try the second for Thursday of the month starting at 6:30 AM.  [facility] You may call the school to see if they are still helping to  provide dental care for emergent cases.      ED Prescriptions   None    PDMP not reviewed this encounter.   Rinaldo Ratel [place], [place] 09/03/22 [phone]

## 2022-09-03 NOTE — ED Triage Notes (Signed)
Per InterpreterDois [phone]   Patient reports that he received 3 prescriptions for Valtrex and states one ws for 5 days, one for 30 days, and another one says bid. Patient wants to know  what he should be taking.

## 2022-09-03 NOTE — Discharge Instructions (Addendum)
Asegrese de cepillarse los [employer]  veces al da y usar hilo dental con regularidad. Enjuguese con agua tibia con solucin salina despus de cepillarse los dientes (es decir, agua tibia con sal mezclada) y escpala.  Si vuelve a tener un brote de herpes, tome 1000 mg de Valtrex una vez al da durante 5 [place].  Please ensure you are brushing your teeth twice daily and flossing regularly. Rinse with warm saline water after brushing, this is warm water with salt mixed in, then spit this out.   If you get another herpes outbreak take 1,000 mg of Valtrex once daily for 5 days.   [facility] in [place], [place] Address: [address] [place], [place], [place] [address] Phone: [phone]  [facility] [phone] extension [phone] [address]  Dr. Lawrence Marseilles [phone] [address]  [place] [phone] [address] [facility] [place].  Rescue mission [phone] extension [phone] [place], [place], [address] First come first serve for the first 10 clients.  May do simple extractions only, no wisdom teeth or surgery.  You may try the second for Thursday of the month starting at 6:30 AM.  [facility] You may call the school to see if they are still helping to provide dental care for emergent cases.

## 2022-09-06 ENCOUNTER — Ambulatory Visit (INDEPENDENT_AMBULATORY_CARE_PROVIDER_SITE_OTHER): Payer: Self-pay | Admitting: Surgery

## 2022-09-06 ENCOUNTER — Encounter: Payer: Self-pay | Admitting: Surgery

## 2022-09-06 VITALS — BP 114/71 | HR 86 | Temp 98.3°F | Ht 66.5 in | Wt 178.4 lb

## 2022-09-06 DIAGNOSIS — K429 Umbilical hernia without obstruction or gangrene: Secondary | ICD-10-CM

## 2022-09-06 NOTE — Patient Instructions (Signed)
You have requested for your Umbilical Hernia be repaired. This will be scheduled with Dr. Aleen Campi at [facility].   Please see your (blue)pre-care sheet for information. Our surgery scheduler will call you to verify surgery date and to go over information.   You will need to arrange to be off work for 1-2 weeks but will have to have a lifting restriction of no more than 15 lbs for 6 weeks following your surgery. If you have FMLA or disability paperwork that needs filled out you may drop this off at our office or this can be faxed to [phone]. Usted ha solicitado que se repare su hernia umbilical. Esto se programar con el Dr. Aleen Campi en [facility].   Consulte su hoja de cuidados previos (azul) para obtener informacin. Nuestro programador de [place] lo llamar para verificar la fecha de la ciruga y repasar la informacin.   Deber hacer arreglos para ausentarse del [place] durante 1 a 2 semanas, pero deber tener una restriccin de levantamiento de no ms de 15 libras durante 6 semanas despus de la [place]. Si tiene documentacin FMLA o de discapacidad que Paramedic, puede dejarla en nuestra oficina o enviarla por fax al [phone].Hernia umbilical en los adultos Umbilical Hernia, Adult  Una hernia es un bulto de tejido que sobresale a travs de una abertura [employer]. Una hernia umbilical se forma en el vientre, cerca del ombligo. La hernia puede contener tejido del intestino delgado o del intestino grueso. Tambin puede contener el tejido graso que recubre los intestinos. Las hernias USAA en los adultos pueden empeorar con el [place]. Deben tratarse con ciruga. Hay varios tipos de hernias umbilicales. Incluyen los siguientes: Hernia indirecta. Esta ocurre justo por encima o por debajo del ombligo. Es el tipo de hernia umbilical ms frecuente en los adultos. Hernia directa. Este tipo ocurre en una abertura formada por el  ombligo. Hernia reducible. Esta hernia viene y va. Es posible que se vea solamente al hacer fuerza, al toser o al levantar algo pesado. Este tipo de hernia se puede reintroducir en el vientre (reducir). Hernia encarcelada. La hernia queda atrapada en la pared del vientre. Este tipo de hernia no se puede reintroducir en el vientre. Puede causar una hernia estrangulada. Hernia estrangulada. Se trata de una hernia que interrumpe el flujo de sangre a los tejidos en su interior. Si esto ocurre, los tejidos [employer]. Este tipo de hernia se debe tratar de inmediato. Cules son las causas? Una hernia umbilical aparece cuando el tejido que est dentro del vientre empuja a travs de una abertura en los msculos abdominales. Qu incrementa el riesgo? Es ms probable que tenga este tipo de hernia si: Hace fuerza al levantar o empujar objetos pesados. Tuvo varios embarazos. Tiene una afeccin que ejerce presin sobre el vientre y la ha tenido durante Melia. Esto incluye lo siguiente: Obesidad. Acumulacin de lquido en el vientre. Vomita o tose todo [employer]. Dificultad para defecar (estreimiento). Se ha sometido a una ciruga que [employer] del vientre. Cules son los signos o sntomas? El principal sntoma de esta afeccin es un bulto en el ombligo o cerca de [place]. El bulto no causa dolor. Otros sntomas dependen del tipo de hernia que tenga. Una hernia reducible puede verse solo al Artist, toser o Lexicographer algo pesado. Otros sntomas pueden incluir: Dolor sordo. Sensacin de opresin. Una hernia encarcelada puede causar un dolor muy intenso. Adems, usted puede: Vomitar o [employer]  nuseas. No poder eliminar gases. Una hernia estrangulada puede causar lo siguiente: Dolor que empeora cada vez ms. Vmitos o ganas de vomitar. Dolor al ejercer presin sobre la hernia. Cambio de color en la piel sobre la hernia. La piel puede ponerse roja o morada. Problemas para  defecar. Sangre en las heces. Cmo se diagnostica? Esta afeccin se puede diagnosticar en funcin de lo siguiente: Los sntomas y antecedentes mdicos. Un examen fsico. Pueden pedirle que tosa o que haga fuerza mientras est de pie. Al hacer eso, ejercer presin dentro del vientre. La presin puede empujar la hernia a travs de la [employer]. El mdico puede intentar empujar la hernia nuevamente hacia adentro del vientre (reducirla). Cmo se trata? La ciruga es el nico tratamiento para una hernia umbilical. La ciruga para una hernia estrangulada debe realizarse de inmediato. Si tiene una hernia pequea que no est encarcelada, tal vez tenga que bajar de peso antes de que se realice la [place]. Siga estas indicaciones en su casa: Control del estreimiento Es posible que tenga que tomar estas medidas para prevenir los problemas para defecar. Esto ayudar a evitar que haga fuerza. Beber suficiente lquido para Radio producer pis (la orina) de color amarillo plido. Usar medicamentos recetados o de Sales promotion account executive. Consumir alimentos ricos en fibra, como frijoles, cereales integrales, y frutas y verduras frescas. Limitar el consumo de alimentos ricos en grasa y azcares, como los alimentos fritos o dulces. Indicaciones generales No trate de reintroducir la hernia. Baje de peso si se lo indic el mdico. Observe si la hernia cambia de color o de tamao. Infrmele al mdico si se producen cambios. Tal vez deba evitar las actividades que ejercen presin sobre la hernia. Es posible que Personnel officer objetos. Pregntele al mdico cunto peso puede levantar sin correr [employer]. Use los medicamentos de venta libre y los recetados solamente como se lo haya indicado el mdico. Comunquese con un mdico si: La hernia se agranda o se endurece. La hernia le causa dolor. Tiene fiebre o escalofros. Solicite ayuda de inmediato si: Siente un dolor muy intenso cerca de la zona de la  hernia, y el dolor aparece repentinamente. Tiene dolor y vomita o siente ganas de Biochemist, clinical. La piel que recubre la hernia cambia de color. Estos sntomas pueden Customer service manager. Solicite ayuda de inmediato. Llame al 911. No espere hasta que los sntomas desaparezcan. No conduzca por sus propios medios [employer]. Esta informacin no tiene Theme park manager el consejo del mdico. Asegrese de hacerle al mdico cualquier pregunta que tenga. Document Revised: 05/20/2022 Document Reviewed: 05/20/2022 Elsevier Patient Education  2024 [employer].

## 2022-09-06 NOTE — Progress Notes (Signed)
09/06/2022  History of Present Illness: Gabriel Ball is a 52 y.o. male presenting for follow-up of an umbilical hernia.  The patient was initially scheduled for robotic assisted umbilical hernia repair on 09/02/2022 but the patient canceled his surgery due to an upper respiratory infection.  The patient reports that he has been to the emergency room a few times since his last visit with me on 08/20/2022.  Looking at his chart, he was seen at urgent care on 08/24/2022 with an ulcer of the tongue, then at the emergency room at [facility] on 08/26/2022 for herpes labialis, then again at the emergency room on 08/29/2022 for viral upper respiratory infection and most recently at urgent care in [facility] on 09/03/2022 for gingivitis.  He presents today for follow-up.  He reports that he does not feel fully recovered and he still having some soreness in his mouth and his throat.  Denies any new troubles with the hernia.  Past Medical History: Past Medical History:  Diagnosis Date   Anxiety    Asthma    GERD (gastroesophageal reflux disease)    Herpes    Shingles    Umbilical hernia      Past Surgical History: Past Surgical History:  Procedure Laterality Date   MOUTH SURGERY      Home Medications: Prior to Admission medications   Medication Sig Start Date End Date Taking? Authorizing Provider  hydrocortisone (ANUSOL-HC) 25 MG suppository Place 1 suppository (25 mg total) rectally 2 (two) times daily. 05/26/22  Yes Georgian Co M, PA-C  Hydrocortisone Acetate 1 % OINT Apply 1 each topically as needed. 05/26/22  Yes Anders Simmonds, PA-C  hydrOXYzine (VISTARIL) 25 MG capsule Take 1 capsule (25 mg total) by mouth every 8 (eight) hours as needed. For anxiety 05/26/22  Yes Anders Simmonds, PA-C  magic mouthwash (nystatin, lidocaine, diphenhydrAMINE) suspension Take 5 mLs by mouth 3 (three) times daily as needed for mouth pain. Swish and spit 08/24/22  Yes Raspet, Erin K, PA-C  mupirocin ointment  (BACTROBAN) 2 % Apply a pea sized amount in the nostrils daily 08/24/22  Yes Raspet, Erin K, PA-C  olopatadine (PATADAY) 0.1 % ophthalmic solution Place 1 drop into both eyes 2 (two) times daily. 05/31/22  Yes Raspet, Erin K, PA-C  polyethylene glycol powder (GLYCOLAX/MIRALAX) 17 GM/SCOOP powder Mix 17 g in 4-8 ounces of water/juice and take by mouth 2 (two) times daily as needed. 08/25/22  Yes Claiborne Rigg, NP  tiZANidine (ZANAFLEX) 4 MG tablet Take 1 tablet (4 mg total) by mouth every 8 (eight) hours as needed for muscle spasms. 05/31/22  Yes Raspet, Erin K, PA-C  SUMAtriptan (IMITREX) 50 MG tablet Take one for headache.  May repeat in 2 hours.  No more than 2 a day 05/27/17 12/09/21  Eustace Moore, MD    Allergies: Allergies  Allergen Reactions   Cyclobenzaprine     Facial Numbness/dizziness/hypersomnolence   Ibuprofen Hives   Methocarbamol     Abdominal pain   Amoxicillin Itching    Pruritic rash   Penicillins Palpitations    Has patient had a PCN reaction causing immediate rash, facial/tongue/throat swelling, SOB or lightheadedness with hypotension: No Has patient had a PCN reaction causing severe rash involving mucus membranes or skin necrosis: No Has patient had a PCN reaction that required hospitalization: No Has patient had a PCN reaction occurring within the last 10 years: No If all of the above answers are "NO", then may proceed with Cephalosporin  use.    Review of Systems: Review of Systems  Constitutional:  Negative for chills and fever.  Respiratory:  Negative for shortness of breath.   Cardiovascular:  Negative for chest pain.  Gastrointestinal:  Negative for abdominal pain, nausea and vomiting.    Physical Exam BP 114/71   Pulse 86   Temp 98.3 F (36.8 C) (Oral)   Ht 5' 6.5" (1.689 m)   Wt 178 lb 6.4 oz (80.9 kg)   SpO2 99%   BMI 28.36 kg/m  CONSTITUTIONAL: No acute distress HEENT:  Normocephalic, atraumatic, extraocular motion intact. RESPIRATORY:   Normal respiratory effort without pathologic use of accessory muscles. CARDIOVASCULAR: Regular rhythm and rate. GI: The abdomen is soft, nondistended, with mild discomfort at the umbilicus.  Hernia is reducible.  NEUROLOGIC:  Motor and sensation is grossly normal.  Cranial nerves are grossly intact. PSYCH:  Alert and oriented to person, place and time. Affect is normal.   Assessment and Plan: This is a 52 y.o. male with a reducible umbilical hernia.  - Patient overall is not feeling 100% recovered yet from all of his issues recently.  He has had multiple ER or urgent care visits recently.  Discussed with patient that for now while he is still getting better, we can still wait to schedule his surgery.  He wants to see a dentist to evaluate for his gingivitis and wants to feel better from his cough and sore throat before proceeding with general anesthesia.  I think is perfectly reasonable at this point where he is not having too many symptoms we can wait.  His hernia remains reducible today on exam. - Patient will contact us once he is feeling better and ready to schedule surgery.  Discussed with him that if it is still within 30 days from today, he can just call us to schedule surgery.  However if it is longer than that, we will have to see him again for an H&P update and schedule surgery during his visit.  All of his questions have been answered.   I spent 20 minutes dedicated to the care of this patient on the date of this encounter to include pre-visit review of records, face-to-face time with the patient discussing diagnosis and management, and any post-visit coordination of care.   [name], MD [facility]

## 2022-09-07 ENCOUNTER — Ambulatory Visit (HOSPITAL_COMMUNITY)
Admission: EM | Admit: 2022-09-07 | Discharge: 2022-09-07 | Disposition: A | Discharge status: Home / Self Care | Payer: Self-pay | Attending: Emergency Medicine | Admitting: Emergency Medicine

## 2022-09-07 ENCOUNTER — Encounter (HOSPITAL_COMMUNITY): Payer: Self-pay

## 2022-09-07 DIAGNOSIS — J029 Acute pharyngitis, unspecified: Secondary | ICD-10-CM

## 2022-09-07 DIAGNOSIS — K146 Glossodynia: Secondary | ICD-10-CM

## 2022-09-07 MED ORDER — CLOTRIMAZOLE 10 MG MT TROC
10.0000 mg | Freq: Every day | OROMUCOSAL | 0 refills | Status: AC
  Filled 2022-09-08: qty 35, 7d supply, fill #0

## 2022-09-07 MED ORDER — OMEPRAZOLE 20 MG PO CPDR
20.0000 mg | DELAYED_RELEASE_CAPSULE | Freq: Two times a day (BID) | ORAL | 0 refills | Status: DC
  Filled 2022-09-08: qty 28, 14d supply, fill #0

## 2022-09-07 NOTE — ED Triage Notes (Signed)
Per interpretor, pt states his tongue feels weird x2wks. States feels like an infection. States been seen for this before and told nothing is wrong.

## 2022-09-07 NOTE — Discharge Instructions (Signed)
Tome pastillas de clotrimazol segn las indicaciones.  Este es un medicamento ms fuerte que el enjuague bucal que recibi anteriormente.  Esto ayudar a cuidar cualquier posible infeccin por hongos en la boca.  Pruebe el omeprazol para la tos y Chief Technology Officer de Advertising copywriter.  Sospecho que el reflujo cido est causando esto.  Hable con su dentista acerca de sus sntomas.  He remitido a un otorrinolaringlogo si el clotrimazol y el omeprazol no funcionan.  Take clotrimazole troches as directed.  This is a stronger medication than the mouthwash you received previously.  This will help take care of any possible yeast infection in your mouth.  Try the omeprazole for the cough and sore throat.  I suspect that acid reflux is causing this.  Talk to your dentist about your symptoms.  I have put in a referral to an ear nose throat specialist if the clotrimazole and omeprazole do not work.

## 2022-09-07 NOTE — ED Provider Notes (Signed)
HPI  SUBJECTIVE:  Gabriel Ball is a 52 y.o. male who presents with burning tongue pain, cough, gingival swelling for the past 2 weeks.  No intraoral ulcers.  He reports burning chest pain, belching and waterbrash. Patient has been seen 3 times in the past 2 weeks for this in the ED in urgent care.  Found to have herpes labialis on 8/8 with a lesion on his tongue.  Was sent home with Magic mouthwash with nystatin, Bactroban for his nose.  Was reporting unusual sensation over his tongue as though there was a film over her that his mouth is dry.  Seen in the ED on 8/8 for mouth lesions.  He was treated with a 5-day course of Valtrex, but took half a pill of Valtrex twice a day 7000 mg twice a day.  Seen here again on 8/16 for gum pain, was found to have gingivitis.  Advised warm saline rinses.  Patient states that he has had conflicting instructions on how to take the Valtrex.  He had testing for oral and genital gonorrhea, chlamydia, were negative.  Flu, RSV, COVID, HSV, strep PCR test also negative over the previous visits.  He is concerned because his wife has frequent yeast infections.  No recent antibiotics.  He has a past medical history of HSV, fissured tongue, GERD not on any medications.  No history of diabetes, thrush, HIV, immunocompromise.  He has a dentist appointment on Thursday.  [facility]  All history obtained through video interpreter Maureen Ralphs [phone].  Past Medical History:  Diagnosis Date   Anxiety    Asthma    GERD (gastroesophageal reflux disease)    Herpes    Shingles    Umbilical hernia     Past Surgical History:  Procedure Laterality Date   MOUTH SURGERY      Family History  Problem Relation Age of Onset   Cancer Father     Social History   Tobacco Use   Smoking status: Former   Smokeless tobacco: Never  Vaping Use   Vaping status: Never Used  Substance Use Topics   Alcohol use: No   Drug use: No    No current  facility-administered medications for this encounter.  Current Outpatient Medications:    clotrimazole (MYCELEX) 10 MG troche, Take 1 tablet (10 mg total) by mouth 5 (five) times daily for 7 days., Disp: 35 tablet, Rfl: 0   omeprazole (PRILOSEC) 20 MG capsule, Take 1 capsule (20 mg total) by mouth 2 (two) times daily for 14 days. X 10 days, Disp: 28 capsule, Rfl: 0   hydrocortisone (ANUSOL-HC) 25 MG suppository, Place 1 suppository (25 mg total) rectally 2 (two) times daily., Disp: 12 suppository, Rfl: 1   Hydrocortisone Acetate 1 % OINT, Apply 1 each topically as needed., Disp: 28.4 g, Rfl: 2   hydrOXYzine (VISTARIL) 25 MG capsule, Take 1 capsule (25 mg total) by mouth every 8 (eight) hours as needed. For anxiety, Disp: 60 capsule, Rfl: 1   mupirocin ointment (BACTROBAN) 2 %, Apply a pea sized amount in the nostrils daily, Disp: 22 g, Rfl: 0   olopatadine (PATADAY) 0.1 % ophthalmic solution, Place 1 drop into both eyes 2 (two) times daily., Disp: 5 mL, Rfl: 0   polyethylene glycol powder (GLYCOLAX/MIRALAX) 17 GM/SCOOP powder, Mix 17 g in 4-8 ounces of water/juice and take by mouth 2 (two) times daily as needed., Disp: 510 g, Rfl: 3   tiZANidine (ZANAFLEX) 4 MG tablet, Take 1  tablet (4 mg total) by mouth every 8 (eight) hours as needed for muscle spasms., Disp: 15 tablet, Rfl: 0  Allergies  Allergen Reactions   Cyclobenzaprine     Facial Numbness/dizziness/hypersomnolence   Ibuprofen Hives   Methocarbamol     Abdominal pain   Amoxicillin Itching    Pruritic rash   Penicillins Palpitations    Has patient had a PCN reaction causing immediate rash, facial/tongue/throat swelling, SOB or lightheadedness with hypotension: No Has patient had a PCN reaction causing severe rash involving mucus membranes or skin necrosis: No Has patient had a PCN reaction that required hospitalization: No Has patient had a PCN reaction occurring within the last 10 years: No If all of the above answers are "NO", then  may proceed with Cephalosporin use.     ROS  As noted in HPI.   Physical Exam  BP 123/84 (BP Location: Left Arm)   Pulse 81   Temp 99 F (37.2 C) (Oral)   Resp 18   SpO2 97%   Constitutional: Well developed, well nourished, no acute distress.  Occasional dry cough. Eyes:  EOMI, conjunctiva normal bilaterally HENT: Normocephalic, atraumatic,mucus membranes moist.  Deeply fissured tongue.  No appreciable erythema, angioedema.  No plaques, intraoral ulcers.  No swelling underneath the tongue.  Erythematous, tender spot underneath the tip of the tongue.  Oropharynx normal.  Neck: No cervical lymphadenopathy Respiratory: Normal inspiratory effort, lungs clear bilaterally Cardiovascular: Normal rate GI: nondistended skin: No rash, skin intact Musculoskeletal: no deformities Neurologic: Alert & oriented x 3, no focal neuro deficits Psychiatric: Speech and behavior appropriate   ED Course   Medications - No data to display  Orders Placed This Encounter  Procedures   Ambulatory referral to ENT    Referral Priority:   Routine    Referral Type:   Consultation    Referral Reason:   Specialty Services Required    Requested Specialty:   Otolaryngology    Number of Visits Requested:   1    No results found for this or any previous visit (from the past 24 hour(s)). No results found.  ED Clinical Impression  1. Burning tongue   2. Sore throat      ED Assessment/Plan      Previous urgent care and outside records, labs reviewed.  As noted in HPI.  Patient presents with burning tongue.  Could be yeast that was not adequately addressed with the nystatin Magic mouthwash.  Unsure if he finished this.  Will try clotrimazole troches and some omeprazole.  I do not see any evidence of an HSV infection.  I suspect his sore throat and cough is coming from GERD.  Will refer to ENT if these steps do not work.  Using the video interpreter, discussed labs,  MDM, treatment plan, and  plan for follow-up with patient. patient agrees with plan.   Spent 30 minutes with patient, obtaining H&P, reviewing previous labs and records, discussing medical decision making and plan for follow-up with patient.  Meds ordered this encounter  Medications   clotrimazole (MYCELEX) 10 MG troche    Sig: Take 1 tablet (10 mg total) by mouth 5 (five) times daily for 7 days.    Dispense:  35 tablet    Refill:  0   omeprazole (PRILOSEC) 20 MG capsule    Sig: Take 1 capsule (20 mg total) by mouth 2 (two) times daily for 14 days. X 10 days    Dispense:  28 capsule    Refill:  0      *This clinic note was created using Scientist, clinical (histocompatibility and immunogenetics). Therefore, there may be occasional mistakes despite careful proofreading.  ?    Domenick Gong, MD 09/08/22 [phone]

## 2022-09-08 ENCOUNTER — Ambulatory Visit (HOSPITAL_COMMUNITY): Payer: Self-pay

## 2022-09-08 ENCOUNTER — Other Ambulatory Visit: Payer: Self-pay

## 2022-09-10 ENCOUNTER — Other Ambulatory Visit: Payer: Self-pay

## 2022-09-10 ENCOUNTER — Encounter (HOSPITAL_COMMUNITY): Payer: Self-pay

## 2022-09-10 ENCOUNTER — Emergency Department (HOSPITAL_COMMUNITY)
Admission: EM | Admit: 2022-09-10 | Discharge: 2022-09-10 | Disposition: A | Discharge status: Home / Self Care | Payer: Self-pay | Attending: Emergency Medicine | Admitting: Emergency Medicine

## 2022-09-10 DIAGNOSIS — K146 Glossodynia: Secondary | ICD-10-CM | POA: Insufficient documentation

## 2022-09-10 MED ORDER — GUAIFENESIN-DM 100-10 MG/5ML PO SYRP
5.0000 mL | ORAL_SOLUTION | ORAL | 0 refills | Status: DC | PRN
Start: 2022-09-10 — End: 2022-09-17
  Filled 2022-09-10: qty 118, 4d supply, fill #0

## 2022-09-10 NOTE — ED Triage Notes (Addendum)
Pt coming in today complaining of of a sore on the bottom of pts tongue. Pt is taking a medication to treat it, with no improvement. Pt has been seen for same complaint recently.

## 2022-09-10 NOTE — ED Provider Notes (Signed)
[place] EMERGENCY DEPARTMENT AT [facility] Provider Note   CSN: [id number] Arrival date & time: 09/10/22  1024     History  Chief Complaint  Patient presents with   Dental Problem   [name] is a 52 y.o. male with asthma presenting for sore on the bottom of his tongue.  States he noticed it about a week ago.  Was seen for same at urgent care and was prescribed nystatin Magic mouthwash and clotrimazole.  States he has been taking the medications as prescribed and symptoms have not improved.  Denies any tongue swelling.  Denies any trouble swallowing.  Denies sore throat.  States he has had a mild nonproductive cough intermittently.  Denies shortness of breath chest pain.  HPI     Home Medications Prior to Admission medications   Medication Sig Start Date End Date Taking? Authorizing Provider  guaiFENesin-dextromethorphan (ROBITUSSIN DM) 100-10 MG/5ML syrup Take 5 mLs by mouth every 4 (four) hours as needed for cough. 09/10/22  Yes Gareth Eagle, PA-C  clotrimazole (MYCELEX) 10 MG troche Slowly dissolve  1 tablet (10 mg total) in mouth 5 (five) times daily for 7 days. 09/07/22 09/15/22  Domenick Gong, MD  hydrocortisone (ANUSOL-HC) 25 MG suppository Place 1 suppository (25 mg total) rectally 2 (two) times daily. 05/26/22   Anders Simmonds, PA-C  Hydrocortisone Acetate 1 % OINT Apply 1 each topically as needed. 05/26/22   Anders Simmonds, PA-C  hydrOXYzine (VISTARIL) 25 MG capsule Take 1 capsule (25 mg total) by mouth every 8 (eight) hours as needed. For anxiety 05/26/22   Anders Simmonds, PA-C  mupirocin ointment (BACTROBAN) 2 % Apply a pea sized amount in the nostrils daily 08/24/22   Raspet, Erin K, PA-C  olopatadine (PATADAY) 0.1 % ophthalmic solution Place 1 drop into both eyes 2 (two) times daily. 05/31/22   Raspet, Noberto Retort, PA-C  omeprazole (PRILOSEC) 20 MG capsule Take 1 capsule (20 mg total) by mouth 2 (two) times daily for 14 days. 09/07/22 09/22/22   Domenick Gong, MD  polyethylene glycol powder (GLYCOLAX/MIRALAX) 17 GM/SCOOP powder Mix 17 g in 4-8 ounces of water/juice and take by mouth 2 (two) times daily as needed. 08/25/22   Claiborne Rigg, NP  tiZANidine (ZANAFLEX) 4 MG tablet Take 1 tablet (4 mg total) by mouth every 8 (eight) hours as needed for muscle spasms. 05/31/22   Raspet, Noberto Retort, PA-C  SUMAtriptan (IMITREX) 50 MG tablet Take one for headache.  May repeat in 2 hours.  No more than 2 a day 05/27/17 12/09/21  Eustace Moore, MD      Allergies    Cyclobenzaprine, Ibuprofen, Methocarbamol, Amoxicillin, and Penicillins    Review of Systems   See HPI for pertinent positives  Physical Exam Updated Vital Signs BP 114/67 (BP Location: Left Arm)   Pulse 72   Temp 98.3 F (36.8 C) (Oral)   Resp 16   Ht 5' 6.5" (1.689 m)   Wt 79.8 kg   SpO2 100%   BMI 27.98 kg/m  Physical Exam Constitutional:      Appearance: Normal appearance.  HENT:     Head: Normocephalic.     Nose: Nose normal.     Mouth/Throat:     Pharynx: Oropharynx is clear. Uvula midline. No pharyngeal swelling, oropharyngeal exudate, posterior oropharyngeal erythema, uvula swelling or postnasal drip.     Comments: Tongue is without lesions, masses or bleeding.  Possibly some mild erythema at the distal  posterior aspect of the tongue.  Soft palate is symmetric and also without edema or erythema. Eyes:     Conjunctiva/sclera: Conjunctivae normal.  Cardiovascular:     Pulses: Normal pulses.     Heart sounds: Normal heart sounds.  Pulmonary:     Effort: Pulmonary effort is normal. No respiratory distress.     Breath sounds: Normal breath sounds. No wheezing, rhonchi or rales.  Neurological:     Mental Status: He is alert.  Psychiatric:        Mood and Affect: Mood normal.     ED Results / Procedures / Treatments   Labs (all labs ordered are listed, but only abnormal results are displayed) Labs Reviewed - No data to  display  EKG None  Radiology No results found.  Procedures Procedures    Medications Ordered in ED Medications - No data to display  ED Course/ Medical Decision Making/ A&P                                 Medical Decision Making  53 year old well-appearing male presenting for sore at the bottom of his tongue.  Exam was unremarkable, tongue, soft palate and posterior oral oropharynx appears grossly normal.  Patient requested cough medicine which I sent to his pharmacy.  Lungs sound clear and patient is afebrile with normal vitals making pneumonia or asthma exacerbation unlikely.  Cough could be related to history of reflux and/or the beginnings of a viral URI. Advised him to follow-up with his PCP.  Vital stable.  Discharged home in good condition.        Final Clinical Impression(s) / ED Diagnoses Final diagnoses:  Tongue burning sensation    Rx / DC Orders ED Discharge Orders          Ordered    guaiFENesin-dextromethorphan (ROBITUSSIN DM) 100-10 MG/5ML syrup  Every 4 hours PRN        09/10/22 1104              Gareth Eagle, PA-C 09/10/22 1104    Linwood Dibbles, MD 09/11/22 [phone]

## 2022-09-10 NOTE — Discharge Instructions (Addendum)
Evaluation for your tongue burning sensation was overall reassuring.  I did not see any sores lesions or masses in the tongue, soft palate and back of your throat appears grossly normal.  Recommend you follow-up with your PCP and your dentist.  I sent some cough medicine to your pharmacy.

## 2022-09-14 ENCOUNTER — Ambulatory Visit (HOSPITAL_COMMUNITY): Payer: Self-pay

## 2022-09-17 ENCOUNTER — Ambulatory Visit (HOSPITAL_COMMUNITY)
Admission: EM | Admit: 2022-09-17 | Discharge: 2022-09-17 | Disposition: A | Discharge status: Home / Self Care | Payer: Self-pay | Attending: Emergency Medicine | Admitting: Emergency Medicine

## 2022-09-17 ENCOUNTER — Emergency Department (HOSPITAL_COMMUNITY)
Admission: EM | Admit: 2022-09-17 | Discharge: 2022-09-17 | Discharge status: Against Medical Advice | Payer: Self-pay | Attending: Emergency Medicine | Admitting: Emergency Medicine

## 2022-09-17 ENCOUNTER — Encounter (HOSPITAL_COMMUNITY): Payer: Self-pay

## 2022-09-17 ENCOUNTER — Other Ambulatory Visit: Payer: Self-pay

## 2022-09-17 ENCOUNTER — Encounter (HOSPITAL_COMMUNITY): Payer: Self-pay | Admitting: *Deleted

## 2022-09-17 DIAGNOSIS — K148 Other diseases of tongue: Secondary | ICD-10-CM | POA: Insufficient documentation

## 2022-09-17 DIAGNOSIS — R051 Acute cough: Secondary | ICD-10-CM | POA: Insufficient documentation

## 2022-09-17 DIAGNOSIS — K2100 Gastro-esophageal reflux disease with esophagitis, without bleeding: Secondary | ICD-10-CM | POA: Insufficient documentation

## 2022-09-17 DIAGNOSIS — K1379 Other lesions of oral mucosa: Secondary | ICD-10-CM | POA: Insufficient documentation

## 2022-09-17 DIAGNOSIS — K146 Glossodynia: Secondary | ICD-10-CM | POA: Insufficient documentation

## 2022-09-17 DIAGNOSIS — Z5321 Procedure and treatment not carried out due to patient leaving prior to being seen by health care provider: Secondary | ICD-10-CM | POA: Insufficient documentation

## 2022-09-17 LAB — HIV ANTIBODY (ROUTINE TESTING W REFLEX): HIV Screen 4th Generation wRfx: NONREACTIVE

## 2022-09-17 NOTE — ED Triage Notes (Signed)
Pt reports a blister under his tongue and cough since yesterday.

## 2022-09-17 NOTE — ED Provider Notes (Addendum)
[facility]    CSN: [id number] Arrival date & time: 09/17/22  1040      History   Chief Complaint Chief Complaint  Patient presents with   Cough   Mouth Lesions    HPI Eivan Stepper is a 52 y.o. male.   Patient presents to clinic complaining of a dry, nonproductive cough and a lesion on his tongue.  Reports continued ulceration on his tongue, no pain.  He did see a dentist Tuesday and he reports he was poked with one of the tools and this caused bruising on his tongue.  He has tried Magic mouthwash, Valtrex, Bactroban intranasally and other interventions. 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   He has also been having a dry cough.  Did try Robitussin for the cough, it did not help.  He does have omeprazole, reports he has not started this medication because he thought it was for GERD and not for his cough.  He denies any chest pain, wheezing or shortness of breath. No fevers or weakness.   Denies hx of HIV/syphilis. No recent testing on file.   Spanish interpreter used for this visit.      The history is provided by the patient and medical records. The history is limited by a language barrier. A language interpreter was used.  Cough Associated symptoms: no chest pain, no fever, no shortness of breath and no wheezing   Mouth Lesions Associated symptoms: no fever     Past Medical History:  Diagnosis Date   Anxiety    Asthma    GERD (gastroesophageal reflux disease)    Herpes    Shingles    Umbilical hernia     Patient Active Problem List   Diagnosis Date Noted   Acute bilateral low back pain without sciatica 06/05/2020   Acute pain of right shoulder 06/05/2020   Asthma due to environmental allergies 12/20/2016   HSV (herpes simplex virus) infection 02/24/2015   Anxiety 04/02/2014    Past Surgical History:  Procedure Laterality Date   MOUTH SURGERY         Home Medications    Prior to Admission medications   Medication Sig Start  Date End Date Taking? Authorizing Provider  olopatadine (PATADAY) 0.1 % ophthalmic solution Place 1 drop into both eyes 2 (two) times daily. 05/31/22  Yes Raspet, Noberto Retort, PA-C  omeprazole (PRILOSEC) 20 MG capsule Take 1 capsule (20 mg total) by mouth 2 (two) times daily for 14 days. 09/07/22 09/22/22 Yes Domenick Gong, MD  SUMAtriptan (IMITREX) 50 MG tablet Take one for headache.  May repeat in 2 hours.  No more than 2 a day 05/27/17 12/09/21  Eustace Moore, MD    Family History Family History  Problem Relation Age of Onset   Cancer Father     Social History Social History   Tobacco Use   Smoking status: Former   Smokeless tobacco: Never  Vaping Use   Vaping status: Never Used  Substance Use Topics   Alcohol use: No   Drug use: No     Allergies   Cyclobenzaprine, Ibuprofen, Methocarbamol, Amoxicillin, and Penicillins   Review of Systems Review of Systems  Constitutional:  Negative for fatigue and fever.  HENT:  Positive for mouth sores. Negative for dental problem.   Respiratory:  Positive for cough. Negative for shortness of breath and wheezing.   Cardiovascular:  Negative for chest pain.     Physical Exam Triage Vital Signs ED Triage  Vitals  Encounter Vitals Group     BP 09/17/22 1119 (!) 106/96     Systolic BP Percentile --      Diastolic BP Percentile --      Pulse Rate 09/17/22 1119 76     Resp 09/17/22 1119 18     Temp 09/17/22 1119 98.4 F (36.9 C)     Temp src --      SpO2 09/17/22 1119 97 %     Weight --      Height --      Head Circumference --      Peak Flow --      Pain Score 09/17/22 1113 0     Pain Loc --      Pain Education --      Exclude from Growth Chart --    No data found.  Updated Vital Signs BP (!) 106/96   Pulse 76   Temp 98.4 F (36.9 C)   Resp 18   SpO2 97%   Visual Acuity Right Eye Distance:   Left Eye Distance:   Bilateral Distance:    Right Eye Near:   Left Eye Near:    Bilateral Near:     Physical  Exam Vitals and nursing note reviewed.  Constitutional:      Appearance: Normal appearance.  HENT:     Head: Normocephalic and atraumatic.     Right Ear: External ear normal.     Left Ear: External ear normal.     Nose: Nose normal.     Mouth/Throat:     Mouth: Mucous membranes are moist.     Tongue: Lesions present.     Comments: Bruising under tongue close to right lower molar, tooth number 30. No obvious vesicular lesions or dental abnormalities.  Eyes:     Conjunctiva/sclera: Conjunctivae normal.  Cardiovascular:     Rate and Rhythm: Normal rate and regular rhythm.     Pulses: Normal pulses.     Heart sounds: Normal heart sounds. No murmur heard. Pulmonary:     Effort: Pulmonary effort is normal. No respiratory distress.     Breath sounds: Normal breath sounds.  Musculoskeletal:        General: Normal range of motion.     Cervical back: Normal range of motion.  Skin:    General: Skin is warm and dry.  Neurological:     General: No focal deficit present.     Mental Status: He is alert and oriented to person, place, and time.  Psychiatric:        Mood and Affect: Mood normal.        Behavior: Behavior normal. Behavior is cooperative.      UC Treatments / Results  Labs (all labs ordered are listed, but only abnormal results are displayed) Labs Reviewed  RPR  HIV ANTIBODY (ROUTINE TESTING W REFLEX)    EKG   Radiology No results found.  Procedures Procedures (including critical care time)  Medications Ordered in UC Medications - No data to display  Initial Impression / Assessment and Plan / UC Course  I have reviewed the triage vital signs and the nursing notes.  Pertinent labs & imaging results that were available during my care of the patient were reviewed by me and considered in my medical decision making (see chart for details).  Vitals in triage reviewed, patient is hemodynamically stable.  Does have small bruising under her tongue, suspect from  recent dental evaluation.  No obvious vesicular lesions  or dental abnormalities noted.  Lungs are vesicular posteriorly.  Advised to start omeprazole 20 mg BID as prescribed for GERD, suspect acid reflux contributing to dry, nonproductive cough. Will check HIV and RPR to r/o immunocompromised status d/t repeat oral ulcers and herpes.  Advised PCP follow-up if symptoms persist.  Plan of care, follow-up care and return precautions given, no questions at this time.    Final Clinical Impressions(s) / UC Diagnoses   Final diagnoses:  Tongue lesion  Burning tongue  Gastroesophageal reflux disease with esophagitis without hemorrhage  Acute cough     Discharge Instructions      Tienes un hematoma en la lengua, que se curar gradualmente con el tiempo. Sospecho que tu tos se debe al reflujo cido. Toma omeprazol dos veces al da segn lo prescrito. Sigue tambin una dieta sin alcohol, alimentos grasos, fritos o muy procesados para evitar que se agrave el reflujo cido. Come comidas pequeas y frecuentes y no te recuestes despus de comer.  Hoy te estamos haciendo pruebas de deteccin del VIH y la sfilis y Armed forces technical officer personal se pondr en contacto contigo si hay algn resultado anormal.  Te recomiendo que hagas un seguimiento con tu mdico de cabecera en relacin con estos problemas recurrentes. Vuelve a la clnica si tienes sntomas nuevos o urgentes.  You have a bruised area to your tongue, this will heal gradually over time.  I suspect your cough is due to acid reflux, take the omeprazole twice daily as prescribed.  Also follow a diet without alcohol, fatty foods, fried foods, or heavily processed foods to help avoid exacerbating your acid reflux.  Eat small frequent meals, and do not lay down after eating.   We are screening in today for HIV and syphilis and our staff will contact you if anything results is abnormal.  I highly suggest following up with your primary care provider regarding this  recurrent issues.  Return to clinic for new or urgent symptoms.      ED Prescriptions   None    PDMP not reviewed this encounter.      Cornella Emmer, [place], [place] 09/17/22 1149

## 2022-09-17 NOTE — Discharge Instructions (Signed)
Tienes un hematoma en la lengua, que se curar gradualmente con el tiempo. Sospecho que tu tos se debe al reflujo cido. Toma omeprazol dos veces al da segn lo prescrito. Sigue tambin una dieta sin alcohol, alimentos grasos, fritos o muy procesados para evitar que se agrave el reflujo cido. Come comidas pequeas y frecuentes y no te recuestes despus de comer.  Hoy te estamos haciendo pruebas de deteccin del VIH y la sfilis y Armed forces technical officer personal se pondr en contacto contigo si hay algn resultado anormal.  Te recomiendo que hagas un seguimiento con tu mdico de cabecera en relacin con estos problemas recurrentes. Vuelve a la clnica si tienes sntomas nuevos o urgentes.  You have a bruised area to your tongue, this will heal gradually over time.  I suspect your cough is due to acid reflux, take the omeprazole twice daily as prescribed.  Also follow a diet without alcohol, fatty foods, fried foods, or heavily processed foods to help avoid exacerbating your acid reflux.  Eat small frequent meals, and do not lay down after eating.   We are screening in today for HIV and syphilis and our staff will contact you if anything results is abnormal.  I highly suggest following up with your primary care provider regarding this recurrent issues.  Return to clinic for new or urgent symptoms.

## 2022-09-17 NOTE — ED Triage Notes (Signed)
PT does not want his meds Faxed to any Pharmacy . He wants all hand writted RX if needed

## 2022-09-17 NOTE — ED Triage Notes (Signed)
Pt is coming in today complaining of a sore on the bottom of his tongue. Pt seen recently for the same complaint. States he has had no improvement.

## 2022-09-18 LAB — RPR: RPR Ser Ql: NONREACTIVE

## 2022-09-24 ENCOUNTER — Other Ambulatory Visit: Payer: Self-pay

## 2022-09-24 MED ORDER — POLYETHYLENE GLYCOL 3350 17 GM/SCOOP PO POWD
ORAL | 3 refills | Status: DC
Start: 2022-08-25 — End: 2023-01-17
  Filled 2022-09-24: qty 238, 14d supply, fill #0
  Filled 2022-10-19: qty 510, 15d supply, fill #1
  Filled 2022-11-05 (×2): qty 510, 15d supply, fill #2
  Filled 2023-01-05: qty 510, 15d supply, fill #3

## 2022-09-24 MED ORDER — DEBROX 6.5 % OT SOLN
OTIC | 0 refills | Status: DC
  Filled 2022-09-24: qty 15, 30d supply, fill #0

## 2022-09-29 ENCOUNTER — Emergency Department (HOSPITAL_COMMUNITY)
Admission: EM | Admit: 2022-09-29 | Discharge: 2022-09-29 | Disposition: A | Discharge status: Home / Self Care | Payer: Self-pay | Attending: Emergency Medicine | Admitting: Emergency Medicine

## 2022-09-29 ENCOUNTER — Emergency Department (HOSPITAL_COMMUNITY): Payer: Self-pay

## 2022-09-29 ENCOUNTER — Other Ambulatory Visit: Payer: Self-pay

## 2022-09-29 ENCOUNTER — Encounter (HOSPITAL_COMMUNITY): Payer: Self-pay

## 2022-09-29 DIAGNOSIS — Z20822 Contact with and (suspected) exposure to covid-19: Secondary | ICD-10-CM | POA: Insufficient documentation

## 2022-09-29 DIAGNOSIS — R053 Chronic cough: Secondary | ICD-10-CM | POA: Insufficient documentation

## 2022-09-29 LAB — RESP PANEL BY RT-PCR (RSV, FLU A&B, COVID)  RVPGX2
Influenza A by PCR: NEGATIVE
Influenza B by PCR: NEGATIVE
Resp Syncytial Virus by PCR: NEGATIVE
SARS Coronavirus 2 by RT PCR: NEGATIVE

## 2022-09-29 NOTE — Discharge Instructions (Signed)
Follow-up with the clinic listed above.  Chest x-ray did not show pneumonia.  For any concerning symptoms return to the emergency room.

## 2022-09-29 NOTE — ED Triage Notes (Signed)
Pt coming in today complaining of a cough. Pt has been seen recently for same complaint. Pt states that s/s have not improved since last being seen. Pt able to speak in full sentences.

## 2022-09-29 NOTE — ED Provider Notes (Signed)
[place] EMERGENCY DEPARTMENT AT [facility] Provider Note   CSN: [id number] Arrival date & time: 09/29/22  0830     History  Chief Complaint  Patient presents with   Cough    Gabriel Ball is a 53 y.o. male.  52 year old male presents today for concern of chronic cough.  Has been seen multiple times for same complaint.  He states he was diagnosed with gastritis but the omeprazole is not helping.  Denies any other associated symptoms.  Cough is nonproductive.  He is concerned he may have pneumonia and would like a chest x-ray.  Does not have a PCP.  Hemodynamically stable.  No respiratory distress.  The history is provided by the patient. A language interpreter was used.       Home Medications Prior to Admission medications   Medication Sig Start Date End Date Taking? Authorizing Provider  carbamide peroxide (DEBROX) 6.5 % OTIC solution place 5 drops into the right ear 2 times daily as needed 08/11/22   Claiborne Rigg, NP  olopatadine (PATADAY) 0.1 % ophthalmic solution Place 1 drop into both eyes 2 (two) times daily. 05/31/22   Raspet, Noberto Retort, PA-C  omeprazole (PRILOSEC) 20 MG capsule Take 1 capsule (20 mg total) by mouth 2 (two) times daily for 14 days. 09/07/22 09/22/22  Domenick Gong, MD  polyethylene glycol powder (GLYCOLAX/MIRALAX) 17 GM/SCOOP powder Mix 17 grams in 4-8 ounces of water/juice and take by mouth 2 times daily as needed 08/25/22   Claiborne Rigg, NP  SUMAtriptan (IMITREX) 50 MG tablet Take one for headache.  May repeat in 2 hours.  No more than 2 a day 05/27/17 12/09/21  Eustace Moore, MD      Allergies    Cyclobenzaprine, Ibuprofen, Methocarbamol, Amoxicillin, and Penicillins    Review of Systems   Review of Systems  Constitutional:  Negative for fever.  HENT:  Negative for sore throat.   Respiratory:  Positive for cough. Negative for shortness of breath and wheezing.   Cardiovascular:  Negative for chest pain.  All other systems  reviewed and are negative.   Physical Exam Updated Vital Signs BP 107/75 (BP Location: Right Arm)   Pulse 76   Temp 98 F (36.7 C) (Oral)   Resp 18   SpO2 100%  Physical Exam Vitals and nursing note reviewed.  Constitutional:      General: He is not in acute distress.    Appearance: Normal appearance. He is not ill-appearing.  HENT:     Head: Normocephalic and atraumatic.     Nose: Nose normal.     Mouth/Throat:     Mouth: Mucous membranes are moist.     Pharynx: No oropharyngeal exudate or posterior oropharyngeal erythema.  Eyes:     General: No scleral icterus.    Extraocular Movements: Extraocular movements intact.     Conjunctiva/sclera: Conjunctivae normal.  Cardiovascular:     Rate and Rhythm: Normal rate and regular rhythm.     Heart sounds: Normal heart sounds.  Pulmonary:     Effort: Pulmonary effort is normal. No respiratory distress.     Breath sounds: Normal breath sounds. No wheezing or rales.  Abdominal:     General: There is no distension.     Tenderness: There is no abdominal tenderness.  Musculoskeletal:        General: Normal range of motion.     Cervical back: Normal range of motion.  Skin:    General: Skin is warm  and dry.  Neurological:     General: No focal deficit present.     Mental Status: He is alert. Mental status is at baseline.     ED Results / Procedures / Treatments   Labs (all labs ordered are listed, but only abnormal results are displayed) Labs Reviewed  RESP PANEL BY RT-PCR (RSV, FLU A&B, COVID)  [id number]    EKG None  Radiology DG Chest 2 View  Result Date: 09/29/2022 CLINICAL DATA:  Cough EXAM: CHEST - 2 VIEW COMPARISON:  Chest radiograph dated 06/17/2022 FINDINGS: Normal lung volumes. No focal consolidations. No pleural effusion or pneumothorax. The heart size and mediastinal contours are within normal limits. No acute osseous abnormality. IMPRESSION: No active cardiopulmonary disease. Electronically Signed   By: Agustin Cree  M.D.   On: 09/29/2022 10:12    Procedures Procedures    Medications Ordered in ED Medications - No data to display  ED Course/ Medical Decision Making/ A&P                                 Medical Decision Making Amount and/or Complexity of Data Reviewed Radiology: ordered.   52 year old male presents today for concern of chronic dry cough.  Was treated for acid reflux with omeprazole but has not had significant improvement.  He states in the past he had pneumonia when he had a prolonged cough.  He would like a chest x-ray today.  No other complaints.  Will obtain x-ray.  Respiratory panel is negative for COVID, flu, RSV.  Chest x-ray without acute cardiopulmonary process.  Particularly no pneumonia.  I will give referral to PCP.  Discussed continuing to take omeprazole.   Final Clinical Impression(s) / ED Diagnoses Final diagnoses:  Chronic cough    Rx / DC Orders ED Discharge Orders     None         Marita Kansas, PA-C 09/29/22 1117    Linwood Dibbles, MD 10/01/22 [phone]

## 2022-10-04 ENCOUNTER — Other Ambulatory Visit: Payer: Self-pay

## 2022-10-04 ENCOUNTER — Ambulatory Visit: Payer: Self-pay | Admitting: *Deleted

## 2022-10-04 ENCOUNTER — Ambulatory Visit: Payer: Self-pay | Attending: Nurse Practitioner | Admitting: Nurse Practitioner

## 2022-10-04 ENCOUNTER — Encounter: Payer: Self-pay | Admitting: Nurse Practitioner

## 2022-10-04 VITALS — BP 108/67 | HR 78 | Temp 99.0°F | Ht 66.6 in | Wt 185.6 lb

## 2022-10-04 DIAGNOSIS — R053 Chronic cough: Secondary | ICD-10-CM

## 2022-10-04 DIAGNOSIS — K429 Umbilical hernia without obstruction or gangrene: Secondary | ICD-10-CM

## 2022-10-04 DIAGNOSIS — J984 Other disorders of lung: Secondary | ICD-10-CM

## 2022-10-04 MED ORDER — CETIRIZINE HCL 10 MG PO TABS
10.0000 mg | ORAL_TABLET | Freq: Every day | ORAL | 11 refills | Status: DC
Start: 2022-10-04 — End: 2022-11-25
  Filled 2022-10-04: qty 30, 30d supply, fill #0

## 2022-10-04 MED ORDER — FLUTICASONE PROPIONATE 50 MCG/ACT NA SUSP
2.0000 | Freq: Every day | NASAL | 1 refills | Status: DC
Start: 2022-10-04 — End: 2023-01-10
  Filled 2022-10-04: qty 16, 30d supply, fill #0

## 2022-10-04 MED ORDER — MONTELUKAST SODIUM 10 MG PO TABS
10.0000 mg | ORAL_TABLET | Freq: Every day | ORAL | 3 refills | Status: DC
Start: 2022-10-04 — End: 2022-11-25
  Filled 2022-10-04: qty 30, 30d supply, fill #0

## 2022-10-04 MED ORDER — BUDESONIDE-FORMOTEROL FUMARATE 160-4.5 MCG/ACT IN AERO
2.0000 | INHALATION_SPRAY | Freq: Two times a day (BID) | RESPIRATORY_TRACT | 3 refills | Status: DC
Start: 2022-10-04 — End: 2022-11-25
  Filled 2022-10-04: qty 1, fill #0
  Filled 2022-10-04: qty 10.2, 30d supply, fill #0

## 2022-10-04 NOTE — Telephone Encounter (Signed)
  Chief Complaint: Cough, sore throat Symptoms: Dry cough, sore throat,ears "Itchy." Frequency: cough 4 weeks, sore throat yesterday. Pertinent Negatives: Patient denies  Disposition: [] ED /[] Urgent Care (no appt availability in office) / [x] Appointment(In office/virtual)/ []  [facility]/ [] Home Care/ [] Refused Recommended Disposition /[] [facility] [facility]/ []  Follow-up with PCP Additional Notes:  Pt mentioned SOB to agent , sent to triage. Pt with angry affect, interpreting, loud voice. "I don't know why you are wasting my time with questions, the doctor will see me and give me medicine. You are wasting my time."  Attempted to explain triage, SOB,etc. Pt continued to interrupt,voice complaints. No care advise as pt hung up. States he will be at appt agent secured for this afternoon.   Assisted by Elane Fritz, [phone]  Reason for Disposition  Earache is present  Answer Assessment - Initial Assessment Questions 1. ONSET: "When did the cough begin?"      4 weeks 2. SEVERITY: "How bad is the cough today?"      BAd spells during day 3. SPUTUM: "Describe the color of your sputum" (none, dry cough; clear, white, yellow, green)     Nasal drainage, white 4. HEMOPTYSIS: "Are you coughing up any blood?" If so ask: "How much?" (flecks, streaks, tablespoons, etc.)     No 5. DIFFICULTY BREATHING: "Are you having difficulty breathing?" If Yes, ask: "How bad is it?" (e.g., mild, moderate, severe)    - MILD: No SOB at rest, mild SOB with walking, speaks normally in sentences, can lie down, no retractions, pulse < 100.    - MODERATE: SOB at rest, SOB with minimal exertion and prefers to sit, cannot lie down flat, speaks in phrases, mild retractions, audible wheezing, pulse 100-120.    - SEVERE: Very SOB at rest, speaks in single words, struggling to breathe, sitting hunched forward, retractions, pulse > 120      Mild, "Nose stuffy" 6. FEVER: "Do you have a fever?" If Yes, ask: "What  is your temperature, how was it measured, and when did it start?"     Unsure  10. OTHER SYMPTOMS: "Do you have any other symptoms?" (e.g., runny nose, wheezing, chest pain)       Sore throat,yesterday. Ears itchy  Protocols used: Cough - Acute Non-Productive-A-AH

## 2022-10-04 NOTE — Progress Notes (Signed)
Assessment & Plan:  Gabriel Ball was seen today for cough and sore throat.  Diagnoses and all orders for this visit:  Chronic cough -     montelukast (SINGULAIR) 10 MG tablet; Take 1 tablet (10 mg total) by mouth at bedtime. tomar 1 comprimido por va oral antes de acostarse para las alergias -     cetirizine (ZYRTEC) 10 MG tablet; Take 1 tablet (10 mg total) by mouth daily. tomar 1 tableta por va oral al da for allergies -     QuantiFERON-TB Gold Plus -     fluticasone (FLONASE) 50 MCG/ACT nasal spray; Place 2 sprays into both nostrils daily. 2 pulverizaciones en ambas fosas nasales al da -     Ambulatory referral to Pulmonology -     budesonide-formoterol (SYMBICORT) 160-4.5 MCG/ACT inhaler; Inhale 2 puffs into the lungs 2 (two) times daily. Inhala 2 inhalaciones 2 veces al da  Restrictive airway disease -     montelukast (SINGULAIR) 10 MG tablet; Take 1 tablet (10 mg total) by mouth at bedtime. tomar 1 comprimido por va oral antes de acostarse para las alergias -     cetirizine (ZYRTEC) 10 MG tablet; Take 1 tablet (10 mg total) by mouth daily. tomar 1 tableta por va oral al da for allergies -     QuantiFERON-TB Gold Plus -     fluticasone (FLONASE) 50 MCG/ACT nasal spray; Place 2 sprays into both nostrils daily. 2 pulverizaciones en ambas fosas nasales al da -     Ambulatory referral to Pulmonology -     budesonide-formoterol (SYMBICORT) 160-4.5 MCG/ACT inhaler; Inhale 2 puffs into the lungs 2 (two) times daily. Inhala 2 inhalaciones 2 veces al da  Umbilical hernia without obstruction and without gangrene -     Ambulatory referral to General Surgery    Patient has been counseled on age-appropriate routine health concerns for screening and prevention. These are reviewed and up-to-date. Referrals have been placed accordingly. Immunizations are up-to-date or declined.    Subjective:   Chief Complaint  Patient presents with   Cough   Sore Throat   Cough Pertinent negatives  include no chest pain, fever, headaches, heartburn, myalgias, shortness of breath or weight loss.  Sore Throat  Associated symptoms include coughing. Pertinent negatives include no headaches, shortness of breath or vomiting.   Gabriel Ball 52 y.o. male presents to office today with complaints of chronic cough.  VRI was used to communicate directly with patient for the entire encounter including providing detailed patient instructions.    Cough Patient complains of nonproductive cough.  Symptoms began several years ago.  The cough is non-productive, without wheezing, dyspnea or hemoptysis, paroxysmal, worsening over time and is aggravated by nothing Associated symptoms include:postnasal drip. Patient does not have new pets. Patient does not have a history of asthma however we only have a PFT on file. Patient does have a history of environmental allergens. Patient does not have recent travel. Patient does have a history of smoking (former). Patient  had previous Chest X-ray. Patient has not had a PPD done.  He states he stopped taking the inhaler Dulera due to side effects in 2017. He does not remember if symbicort was effective.    He canceled his umbilical hernia surgery due to his cough and recent ENT issues. Wants to be seen in [place] now instead of [place].    Review of Systems  Constitutional:  Negative for fever, malaise/fatigue and weight loss.  HENT:  Negative for nosebleeds.  PND  Eyes: Negative.  Negative for blurred vision, double vision and photophobia.  Respiratory:  Positive for cough. Negative for shortness of breath.   Cardiovascular: Negative.  Negative for chest pain, palpitations and leg swelling.  Gastrointestinal: Negative.  Negative for heartburn, nausea and vomiting.  Musculoskeletal: Negative.  Negative for myalgias.  Neurological: Negative.  Negative for dizziness, focal weakness, seizures and headaches.  Psychiatric/Behavioral: Negative.   Negative for suicidal ideas.     Past Medical History:  Diagnosis Date   Anxiety    Asthma    GERD (gastroesophageal reflux disease)    Herpes    Shingles    Umbilical hernia     Past Surgical History:  Procedure Laterality Date   MOUTH SURGERY      Family History  Problem Relation Age of Onset   Cancer Father     Social History Reviewed with no changes to be made today.   Outpatient Medications Prior to Visit  Medication Sig Dispense Refill   carbamide peroxide (DEBROX) 6.5 % OTIC solution place 5 drops into the right ear 2 times daily as needed 15 mL 0   polyethylene glycol powder (GLYCOLAX/MIRALAX) 17 GM/SCOOP powder Mix 17 grams in 4-8 ounces of water/juice and take by mouth 2 times daily as needed 510 g 3   omeprazole (PRILOSEC) 20 MG capsule Take 1 capsule (20 mg total) by mouth 2 (two) times daily for 14 days. 28 capsule 0   olopatadine (PATADAY) 0.1 % ophthalmic solution Place 1 drop into both eyes 2 (two) times daily. (Patient not taking: Reported on 10/04/2022) 5 mL 0   No facility-administered medications prior to visit.    Allergies  Allergen Reactions   Cyclobenzaprine     Facial Numbness/dizziness/hypersomnolence   Ibuprofen Hives   Methocarbamol     Abdominal pain   Amoxicillin Itching    Pruritic rash   Penicillins Palpitations    Has patient had a PCN reaction causing immediate rash, facial/tongue/throat swelling, SOB or lightheadedness with hypotension: No Has patient had a PCN reaction causing severe rash involving mucus membranes or skin necrosis: No Has patient had a PCN reaction that required hospitalization: No Has patient had a PCN reaction occurring within the last 10 years: No If all of the above answers are "NO", then may proceed with Cephalosporin use.       Objective:    BP 108/67 (BP Location: Left Arm, Patient Position: Sitting, Cuff Size: Normal)   Pulse 78   Temp 99 F (37.2 C) (Oral)   Ht 5' 6.6" (1.692 m)   Wt 185 lb 9.6 oz  (84.2 kg)   SpO2 97%   BMI 29.42 kg/m  Wt Readings from Last 3 Encounters:  10/04/22 185 lb 9.6 oz (84.2 kg)  09/17/22 176 lb (79.8 kg)  09/10/22 176 lb (79.8 kg)    Physical Exam Vitals and nursing note reviewed.  Constitutional:      Appearance: He is well-developed.  HENT:     Head: Normocephalic and atraumatic.     Nose:     Right Turbinates: Enlarged, swollen and pale.     Left Turbinates: Enlarged, swollen and pale.  Cardiovascular:     Rate and Rhythm: Normal rate and regular rhythm.     Heart sounds: Normal heart sounds. No murmur heard.    No friction rub. No gallop.  Pulmonary:     Effort: Pulmonary effort is normal. No tachypnea or respiratory distress.     Breath sounds: Normal breath sounds.  No decreased breath sounds, wheezing, rhonchi or rales.  Chest:     Chest wall: No tenderness.  Abdominal:     General: Bowel sounds are normal.     Palpations: Abdomen is soft.  Musculoskeletal:        General: Normal range of motion.     Cervical back: Normal range of motion.  Skin:    General: Skin is warm and dry.  Neurological:     Mental Status: He is alert and oriented to person, place, and time.     Coordination: Coordination normal.  Psychiatric:        Behavior: Behavior normal. Behavior is cooperative.        Thought Content: Thought content normal.        Judgment: Judgment normal.          Patient has been counseled extensively about nutrition and exercise as well as the importance of adherence with medications and regular follow-up. The patient was given clear instructions to go to ER or return to medical center if symptoms don't improve, worsen or new problems develop. The patient verbalized understanding.   Follow-up: Return if symptoms worsen or fail to improve.   Claiborne Rigg, FNP-BC [facility] [place], [place] [phone]   10/04/2022, 4:04 PM

## 2022-10-04 NOTE — Telephone Encounter (Signed)
Noted patient has appointment today with PCP

## 2022-10-08 ENCOUNTER — Telehealth: Payer: Self-pay | Admitting: Nurse Practitioner

## 2022-10-08 NOTE — Telephone Encounter (Signed)
Patient called for lab results. Returned patient's call (with interpreter)  and lab results given. Patient verbal understanding.

## 2022-10-08 NOTE — Telephone Encounter (Signed)
noted 

## 2022-10-11 ENCOUNTER — Ambulatory Visit (HOSPITAL_COMMUNITY)
Admission: EM | Admit: 2022-10-11 | Discharge: 2022-10-11 | Disposition: A | Discharge status: Home / Self Care | Payer: Self-pay | Attending: Emergency Medicine | Admitting: Emergency Medicine

## 2022-10-11 ENCOUNTER — Telehealth: Payer: Self-pay | Admitting: Nurse Practitioner

## 2022-10-11 ENCOUNTER — Other Ambulatory Visit: Payer: Self-pay

## 2022-10-11 DIAGNOSIS — L209 Atopic dermatitis, unspecified: Secondary | ICD-10-CM

## 2022-10-11 DIAGNOSIS — R053 Chronic cough: Secondary | ICD-10-CM

## 2022-10-11 MED ORDER — BENZONATATE 100 MG PO CAPS
100.0000 mg | ORAL_CAPSULE | Freq: Three times a day (TID) | ORAL | 0 refills | Status: DC
  Filled 2022-10-11: qty 21, 7d supply, fill #0

## 2022-10-11 MED ORDER — HYDROCORTISONE 1 % EX CREA
TOPICAL_CREAM | CUTANEOUS | 0 refills | Status: DC
  Filled 2022-10-11: qty 15, fill #0

## 2022-10-11 NOTE — ED Triage Notes (Signed)
Gabriel Ball [id number]  Cough for 5 weeks that's not improving. Taking allergy meds.

## 2022-10-11 NOTE — Telephone Encounter (Signed)
Good Morning  We don't have a general surgery that accept the [facility]  in [place] . I was unable to talk to patient  lvm  to patient that the only place we have that accept the  [facility]  is in [place] if he prefer in [place] have to pay out of his pocket .  If he can please, call us back and let me know to proceed with the referral.

## 2022-10-11 NOTE — Telephone Encounter (Signed)
Pt stated if there is no choice, it's okay to send the referral to [facility]. He mentioned that he has already seen a Careers adviser in [place] twice; he doesn't remember his name, but it's fine to stay in [place]; he asked for [facility] as it's more convenient.  Please advise.

## 2022-10-11 NOTE — ED Provider Notes (Signed)
[facility]    CSN: [id number] Arrival date & time: 10/11/22  4098      History   Chief Complaint Chief Complaint  Patient presents with   Cough    [name] is a 52 y.o. male.   Patient presented for cough x 5 weeks.  Patient reports taking allergy medications with no relief.  Denies shortness of breath, chest pain, and fever.   Cough Associated symptoms: no chest pain, no chills, no fever, no headaches, no shortness of breath, no sore throat and no wheezing     Past Medical History:  Diagnosis Date   Anxiety    Asthma    GERD (gastroesophageal reflux disease)    Herpes    Shingles    Umbilical hernia     Patient Active Problem List   Diagnosis Date Noted   Acute bilateral low back pain without sciatica 06/05/2020   Acute pain of right shoulder 06/05/2020   Asthma due to environmental allergies 12/20/2016   HSV (herpes simplex virus) infection 02/24/2015   Anxiety 04/02/2014    Past Surgical History:  Procedure Laterality Date   MOUTH SURGERY         Home Medications    Prior to Admission medications   Medication Sig Start Date End Date Taking? Authorizing Provider  benzonatate (TESSALON) 100 MG capsule Take 1 capsule (100 mg total) by mouth every 8 (eight) hours. 10/11/22  Yes Susann Givens, Enrika Aguado A, NP  cetirizine (ZYRTEC) 10 MG tablet Take 1 tablet (10 mg total) by mouth daily. tomar 1 tableta por va oral al da for allergies 10/04/22  Yes Claiborne Rigg, NP  hydrocortisone cream 1 % Apply to affected area 2 times daily 10/11/22  Yes Susann Givens, Jaciel Diem A, NP  montelukast (SINGULAIR) 10 MG tablet Take 1 tablet (10 mg total) by mouth at bedtime. tomar 1 comprimido por va oral antes de acostarse para las alergias 10/04/22  Yes Claiborne Rigg, NP  budesonide-formoterol (SYMBICORT) 160-4.5 MCG/ACT inhaler Inhale 2 puffs into the lungs 2 (two) times daily. Inhala 2 inhalaciones 2 veces al da 10/04/22   Claiborne Rigg, NP  carbamide  peroxide (DEBROX) 6.5 % OTIC solution place 5 drops into the right ear 2 times daily as needed 08/11/22   Claiborne Rigg, NP  fluticasone (FLONASE) 50 MCG/ACT nasal spray Place 2 sprays into both nostrils daily. 2 pulverizaciones en ambas fosas nasales al da 10/04/22   Claiborne Rigg, NP  omeprazole (PRILOSEC) 20 MG capsule Take 1 capsule (20 mg total) by mouth 2 (two) times daily for 14 days. 09/07/22 09/22/22  Domenick Gong, MD  polyethylene glycol powder (GLYCOLAX/MIRALAX) 17 GM/SCOOP powder Mix 17 grams in 4-8 ounces of water/juice and take by mouth 2 times daily as needed 08/25/22   Claiborne Rigg, NP  SUMAtriptan (IMITREX) 50 MG tablet Take one for headache.  May repeat in 2 hours.  No more than 2 a day 05/27/17 12/09/21  Eustace Moore, MD    Family History Family History  Problem Relation Age of Onset   Cancer Father     Social History Social History   Tobacco Use   Smoking status: Former   Smokeless tobacco: Never  Vaping Use   Vaping status: Never Used  Substance Use Topics   Alcohol use: No   Drug use: No     Allergies   Cyclobenzaprine, Ibuprofen, Methocarbamol, Amoxicillin, and Penicillins   Review of Systems Review of Systems  Constitutional:  Negative for chills, fatigue and fever.  HENT:  Positive for congestion. Negative for sore throat.   Respiratory:  Positive for cough. Negative for chest tightness, shortness of breath and wheezing.   Cardiovascular:  Negative for chest pain.  Gastrointestinal:  Negative for abdominal pain, diarrhea, nausea and vomiting.  Neurological:  Negative for dizziness, weakness and headaches.     Physical Exam Triage Vital Signs ED Triage Vitals  Encounter Vitals Group     BP 10/11/22 0859 116/73     Systolic BP Percentile --      Diastolic BP Percentile --      Pulse Rate 10/11/22 0859 85     Resp 10/11/22 0859 18     Temp 10/11/22 0859 98.3 F (36.8 C)     Temp Source 10/11/22 0859 Oral     SpO2 10/11/22 0859  98 %     Weight 10/11/22 0902 182 lb (82.6 kg)     Height --      Head Circumference --      Peak Flow --      Pain Score 10/11/22 0901 0     Pain Loc --      Pain Education --      Exclude from Growth Chart --    No data found.  Updated Vital Signs BP 116/73 (BP Location: Left Arm)   Pulse 85   Temp 98.3 F (36.8 C) (Oral)   Resp 18   Wt 182 lb (82.6 kg)   SpO2 98%   BMI 28.85 kg/m   Visual Acuity Right Eye Distance:   Left Eye Distance:   Bilateral Distance:    Right Eye Near:   Left Eye Near:    Bilateral Near:     Physical Exam Vitals and nursing note reviewed.  Constitutional:      General: He is awake. He is not in acute distress.    Appearance: Normal appearance. He is well-developed and well-groomed. He is not ill-appearing, toxic-appearing or diaphoretic.  HENT:     Nose: Congestion and rhinorrhea present.     Mouth/Throat:     Mouth: Mucous membranes are moist.     Pharynx: Posterior oropharyngeal erythema and postnasal drip present. No pharyngeal swelling or oropharyngeal exudate.     Tonsils: No tonsillar exudate.  Cardiovascular:     Rate and Rhythm: Normal rate.     Heart sounds: Normal heart sounds.  Pulmonary:     Effort: Pulmonary effort is normal.     Breath sounds: Normal breath sounds.  Musculoskeletal:     Cervical back: Normal range of motion.  Skin:    General: Skin is warm and dry.     Findings: Rash present. No lesion or petechiae. Rash is papular.     Comments: 1 inch area of papular rash to upper left arm.   Neurological:     Mental Status: He is alert.  Psychiatric:        Behavior: Behavior is cooperative.      UC Treatments / Results  Labs (all labs ordered are listed, but only abnormal results are displayed) Labs Reviewed - No data to display  EKG   Radiology No results found.  Procedures Procedures (including critical care time)  Medications Ordered in UC Medications - No data to display  Initial  Impression / Assessment and Plan / UC Course  I have reviewed the triage vital signs and the nursing notes.  Pertinent labs & imaging results that were available during my  care of the patient were reviewed by me and considered in my medical decision making (see chart for details).     Patient presented for 5-week history of cough.  Reports taking allergy medications with no relief.  Denies shortness of breath, chest pain, and fever.  Patient states cough is productive at times with clear/white sputum.  Patient has been seen multiple times for same.  Patient was given amatory referral to pulmonologist from primary care provider, but has not scheduled an appointment with them.  Patient states he is afraid to use his Symbicort inhaler due to side effects of a previous inhaler.  Mild erythema noted to headaches, congestion and rhinorrhea present.  Lungs clear bilaterally on auscultation.  Patient also reported small itchy rash to upper left arm.  Patient as 1 inch area to papular rash to upper left arm. Recommended that patient follow-up with pulmonology and use Symbicort inhaler as prescribed.  Recommended patient stay consistent with Zyrtec, Singulair, and Flonase daily.  Prescribed Tessalon as needed for cough. Prescribed hydrocortisone for rash. Discussed follow-up, return, and ER precautions.  Final Clinical Impressions(s) / UC Diagnoses   Final diagnoses:  Chronic cough  Atopic dermatitis, unspecified type     Discharge Instructions      Please start using Symbicort inhaler 2 puffs twice daily, continue taking Zyrtec, Singulair, and using Flonase daily. Please follow-up with pulmonologist that you were referred to by primary care doctor for further management of chronic cough. You can take Tessalon as needed for cough every 8 hours. Use hydrocortisone cream twice daily to itchy place on your arm. Return here as needed. If you develop trouble breathing please seek immediate medical treatment in  the ER.   Comience a Conservation officer, historic buildings 2 inhalaciones dos veces al da, contine tomando Zyrtec, Singulair y Flonase diariamente. Haga un seguimiento con el neumlogo al que fue remitido por su mdico de atencin primaria para un mayor control de la tos crnica. Puede tomar Tessalon segn sea necesario para la tos cada 8 horas. Use crema de hidrocortisona dos veces al da para Paramedic la picazn en el brazo. Regrese aqu segn sea necesario. Si tiene problemas para respirar, busque tratamiento mdico inmediato en la sala de emergencias.    ED Prescriptions     Medication Sig Dispense Auth. Provider   benzonatate (TESSALON) 100 MG capsule Take 1 capsule (100 mg total) by mouth every 8 (eight) hours. 21 capsule Susann Givens, Hollis Tuller A, NP   hydrocortisone cream 1 % Apply to affected area 2 times daily 15 g Wynonia Lawman A, NP      PDMP not reviewed this encounter.   Wynonia Lawman A, NP 10/11/22 1104

## 2022-10-11 NOTE — Discharge Instructions (Addendum)
Please start using Symbicort inhaler 2 puffs twice daily, continue taking Zyrtec, Singulair, and using Flonase daily. Please follow-up with pulmonologist that you were referred to by primary care doctor for further management of chronic cough. You can take Tessalon as needed for cough every 8 hours. Use hydrocortisone cream twice daily to itchy place on your arm. Return here as needed. If you develop trouble breathing please seek immediate medical treatment in the ER.   Comience a Conservation officer, historic buildings 2 inhalaciones dos veces al da, contine tomando Zyrtec, Singulair y Flonase diariamente. Haga un seguimiento con el neumlogo al que fue remitido por su mdico de atencin primaria para un mayor control de la tos crnica. Puede tomar Tessalon segn sea necesario para la tos cada 8 horas. Use crema de hidrocortisona dos veces al da para Paramedic la picazn en el brazo. Regrese aqu segn sea necesario. Si tiene problemas para respirar, busque tratamiento mdico inmediato en la sala de emergencias.

## 2022-10-12 ENCOUNTER — Ambulatory Visit (INDEPENDENT_AMBULATORY_CARE_PROVIDER_SITE_OTHER): Payer: Self-pay | Admitting: Otolaryngology

## 2022-10-12 ENCOUNTER — Encounter (INDEPENDENT_AMBULATORY_CARE_PROVIDER_SITE_OTHER): Payer: Self-pay | Admitting: Otolaryngology

## 2022-10-12 VITALS — BP 108/54 | HR 76 | Ht 66.0 in | Wt 183.0 lb

## 2022-10-12 DIAGNOSIS — R053 Chronic cough: Secondary | ICD-10-CM

## 2022-10-12 DIAGNOSIS — K1379 Other lesions of oral mucosa: Secondary | ICD-10-CM

## 2022-10-12 DIAGNOSIS — R058 Other specified cough: Secondary | ICD-10-CM

## 2022-10-12 NOTE — Telephone Encounter (Signed)
He doesn't need another referral to the same surgeon in [place]. If he wants to follow up with them he just needs to call them

## 2022-10-12 NOTE — Progress Notes (Signed)
Otolaryngology Clinic Note Referring provider: Dr. Chaney Malling HPI:  Gabriel Ball is a 52 y.o. male kindly referred by Dr. Chaney Malling for evaluation of oral ulcers. He reports that he had ulcers in August 2024, went to ED and has had multiple treatments for it. Noted to have some bruising under tongue close to right lower molar (#30), no vesicular lesions noted. He reports this may have started due to oral sex. 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 He has had fairly extensive workup with brief immune status workup as well with HIV/RPR testing which has also been negative. He also has had STI and lab workup which has all been unrevealing so far. Today, he reports that his ulcers have all resolved and he is not having any burning mouth pain. He denies history of diabetes, thrush, HIV, immunocompromise.  He did have some recent dental work. He has tried clotrimazle and omeprazole as well. It is unclear what benefits him as he does have pain with the ulcers but they do resolve on their own. He reports he has had them for past couple of years but will go away on their own. No TB risk factors except fr the fact he grew up in an area with potential TB. No B-symptoms, no productive cough. No other autoimmune symptoms like fatigue or lesions anywhere else. No other genital or ulcers anywhere else recently coinciding with these ulcers that would point towards Behcet's  He also reports that he has a dry cough without relief. He otherwise denies other symptoms such as PND, throat clearing, change in voice or swallowing. No trouble breathing. It is unclear what has precipitated these symptoms but he did have some URI type symptoms a few weeks ago beforehand. He has also tried allergy medication without much benefit. Of note, ED had referred him to a Pulmonologist as he has been prescribed but not using a rescue inhaler and he wants to have his lungs checked. He  feels like the cough is constant and without triggers such as exercise, strong perfumes. No fevers/chills/hemoptysis. Denies any other pulmonary symptoms.   He does not have burning mouth, odynophagia, dysphagia, ear pain, neck masses, weight loss, hemoptysis.   Head CT 08/09/2022: Mastoid well aerated, no obvious lesions noted in oral cavity though exam is suboptimal;no significant sinonasal opacification CXR 09/29/2022: No obvious cardiopulmonary abnormality per my read; no focal consolidation that I am able to appreciate  He reports he has an umbilical hernia and reports that he would also like to see a pulmonologist for clearance before he can undergo repair (due to risk of anesthesia with a cough)  Spanish interpreter used for his visit: Gerilyn Nestle  PMH/Meds/All/SocHx/FamHx/ROS:   Past Medical History:  Diagnosis Date   Anxiety    Asthma    GERD (gastroesophageal reflux disease)    Herpes    Shingles    Umbilical hernia      Past Surgical History:  Procedure Laterality Date   MOUTH SURGERY      Family History  Problem Relation Age of Onset   Cancer Father    No family history of bleeding disorders or difficulty with anesthesia.  Social Connections: Not on file     Current Outpatient Medications:    benzonatate (TESSALON) 100 MG capsule, Take 1 capsule (100 mg total) by mouth every 8 (eight) hours., Disp: 21 capsule, Rfl: 0   budesonide-formoterol (SYMBICORT) 160-4.5 MCG/ACT inhaler, Inhale 2 puffs into the lungs 2 (  two) times daily. Inhala 2 inhalaciones 2 veces al da, Disp: 10.2 g, Rfl: 3   carbamide peroxide (DEBROX) 6.5 % OTIC solution, place 5 drops into the right ear 2 times daily as needed, Disp: 15 mL, Rfl: 0   cetirizine (ZYRTEC) 10 MG tablet, Take 1 tablet (10 mg total) by mouth daily. tomar 1 tableta por va oral al da for allergies, Disp: 30 tablet, Rfl: 11   fluticasone (FLONASE) 50 MCG/ACT nasal spray, Place 2 sprays into both nostrils daily. 2 pulverizaciones  en ambas fosas nasales al da, Disp: 16 g, Rfl: 1   hydrocortisone cream 1 %, Apply to affected area 2 times daily, Disp: 15 g, Rfl: 0   montelukast (SINGULAIR) 10 MG tablet, Take 1 tablet (10 mg total) by mouth at bedtime. tomar 1 comprimido por va oral antes de acostarse para las alergias, Disp: 30 tablet, Rfl: 3   polyethylene glycol powder (GLYCOLAX/MIRALAX) 17 GM/SCOOP powder, Mix 17 grams in 4-8 ounces of water/juice and take by mouth 2 times daily as needed, Disp: 510 g, Rfl: 3   omeprazole (PRILOSEC) 20 MG capsule, Take 1 capsule (20 mg total) by mouth 2 (two) times daily for 14 days., Disp: 28 capsule, Rfl: 0  A 10-point ROS was performed with pertinent positives/negatives noted in the HPI Physical Exam:   BP (!) 108/54 (BP Location: Left Arm, Patient Position: Sitting, Cuff Size: Large)   Pulse 76   Ht 5\' 6"  (1.676 m)   Wt 183 lb (83 kg)   SpO2 98%   BMI 29.54 kg/m    Salient findings:  CN II-XII intact Bilateral EAC clear and TM intact with well pneumatized middle ear spaces Weber 512: Anterior rhinoscopy: Septum relatively midline; bilateral inferior turbinates without significant hypertrophy; no obvious polyps or purulence nted on anterior rhinoscopy No lesions of oral cavity/oropharynx; I am unable to appreciate any ulcers or mucosal abnormalities; tongue is fissured but no obvious lesions noted. No tenderness. No obviously palpable neck masses/lymphadenopathy/thyromegaly No respiratory distress or stridor; he does have an intermittent dry cough but this does not occur very frequently  Independent Review of Additional Tests or Records:  ED notes reviewed and summarized and relevant labs as well 2024 labs: Quant-Gold, Resp panel, HIV, RPR, Strep, HSV swab, CBC all from after July 2022 - wnl, unrevealing.  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 male with multiple problems, but mostly recurrent  oral ulcers which have been ongoing for past few months. Unclear etiology but he has had a fairly extensive workup which has not been revealing. He reports that the ulcers do resolve by themselves and he currently does not have any ulcers. In addition, he has a dry cough that has been ongoing after what sound like potential URI few weeks ago - he is worried about this and wishes to see pulm.  Recurrent oral ulcers - unclear etiology, possibly most recent episode related to HSV?; possible just recurrent aphthous ulcers - Does not seem to be immunocompromised; labs unremarkable - Thinking re: autoimmune labs/workup or etiology like Behcet's but does not appear to be the case clinically - Can check folate/B12 but he would rather just take multivitamin - No lesions today - I reported that if he has a future episode that does not resolve, call our office or derm and we can consider biopsying this lesion. May refer to derm anyway if unable to solve  this to see if they have any further input  2. Dry cough - Sounds like related to a URI and post-viral cough but he wishes to see pulm - Could be laryngeal hypersensitivity and we discussed "sipping water" strategy.  - Will refer to pulm to rule out any pulm cause as he strongly wishes this given the inhalers he has been prescribed; can consider flex scope to look at larynx depending on pulm eval but he deferred today - Can continue Antihistamine if beneficial but not having much allergy/PND type of symptoms.  - f/u as needed   Thank you for allowing me the opportunity to care for your patient. Please do not hesitate to contact me should you have any other questions.  Sincerely, Jovita Kussmaul, MD Otolarynoglogist (ENT), [facility] Phone: [phone] Fax: [phone]  10/12/2022, 2:15 PM

## 2022-10-19 ENCOUNTER — Other Ambulatory Visit: Payer: Self-pay

## 2022-11-05 ENCOUNTER — Other Ambulatory Visit: Payer: Self-pay

## 2022-11-08 ENCOUNTER — Other Ambulatory Visit: Payer: Self-pay

## 2022-11-08 ENCOUNTER — Encounter: Payer: Self-pay | Admitting: Surgery

## 2022-11-08 ENCOUNTER — Ambulatory Visit (INDEPENDENT_AMBULATORY_CARE_PROVIDER_SITE_OTHER): Payer: Self-pay | Admitting: Surgery

## 2022-11-08 VITALS — BP 110/73 | HR 81 | Temp 98.0°F | Ht 66.0 in | Wt 185.0 lb

## 2022-11-08 DIAGNOSIS — K429 Umbilical hernia without obstruction or gangrene: Secondary | ICD-10-CM

## 2022-11-08 NOTE — Patient Instructions (Addendum)
We will have you follow up here after you see Pulmonary in December.  We will call you about this appointment.   Le haremos un seguimiento aqu despus de que vea Pulmonary en diciembre.  Le llamaremos sobre esta cita.   Hernia umbilical en los adultos Umbilical Hernia, Adult  Una hernia es un bulto de tejido que sobresale a travs de una abertura [employer]. Una hernia umbilical se forma en el vientre, cerca del ombligo. La hernia puede contener tejido del intestino delgado o del intestino grueso. Tambin puede contener el tejido graso que recubre los intestinos. Las hernias USAA en los adultos pueden empeorar con el [place]. Deben tratarse con ciruga. Hay varios tipos de hernias umbilicales. Incluyen los siguientes: Hernia indirecta. Esta ocurre justo por encima o por debajo del ombligo. Es el tipo de hernia umbilical ms frecuente en los adultos. Hernia directa. Este tipo ocurre en una abertura formada por el ombligo. Hernia reducible. Esta hernia viene y va. Es posible que se vea solamente al hacer fuerza, al toser o al levantar algo pesado. Este tipo de hernia se puede reintroducir en el vientre (reducir). Hernia encarcelada. La hernia queda atrapada en la pared del vientre. Este tipo de hernia no se puede reintroducir en el vientre. Puede causar una hernia estrangulada. Hernia estrangulada. Se trata de una hernia que interrumpe el flujo de sangre a los tejidos en su interior. Si esto ocurre, los tejidos [employer]. Este tipo de hernia se debe tratar de inmediato. Cules son las causas? Una hernia umbilical aparece cuando el tejido que est dentro del vientre empuja a travs de una abertura en los msculos abdominales. Qu incrementa el riesgo? Es ms probable que tenga este tipo de hernia si: Hace fuerza al levantar o empujar objetos pesados. Tuvo varios embarazos. Tiene una afeccin que ejerce presin sobre el vientre y la ha tenido durante [place]. Esto incluye  lo siguiente: Obesidad. Acumulacin de lquido en el vientre. Vomita o tose todo [employer]. Dificultad para defecar (estreimiento). Se ha sometido a una ciruga que [employer] del vientre. Cules son los signos o sntomas? El principal sntoma de esta afeccin es un bulto en el ombligo o cerca de [place]. El bulto no causa dolor. Otros sntomas dependen del tipo de hernia que tenga. Una hernia reducible puede verse solo al Artist, toser o Lexicographer algo pesado. Otros sntomas pueden incluir: Dolor sordo. Sensacin de opresin. Una hernia encarcelada puede causar un dolor muy intenso. Adems, usted puede: Vomitar o tener nuseas. No poder eliminar gases. Una hernia estrangulada puede causar lo siguiente: Dolor que empeora cada vez ms. Vmitos o ganas de vomitar. Dolor al ejercer presin sobre la hernia. Cambio de color en la piel sobre la hernia. La piel puede ponerse roja o morada. Problemas para defecar. Sangre en las heces. Cmo se diagnostica? Esta afeccin se puede diagnosticar en funcin de lo siguiente: Los sntomas y antecedentes mdicos. Un examen fsico. Pueden pedirle que tosa o que haga fuerza mientras est de pie. Al hacer eso, ejercer presin dentro del vientre. La presin puede empujar la hernia a travs de la [employer]. El mdico puede intentar empujar la hernia nuevamente hacia adentro del vientre (reducirla). Cmo se trata? La ciruga es el nico tratamiento para una hernia umbilical. La ciruga para una hernia estrangulada debe realizarse de inmediato. Si tiene una hernia pequea que no est encarcelada, tal vez tenga que bajar de peso antes de que se realice la [place]. Siga  estas indicaciones en su casa: Control del estreimiento Es posible que tenga que tomar estas medidas para prevenir los problemas para defecar. Esto ayudar a evitar que haga fuerza. Beber suficiente lquido para Radio producer pis (la orina) de color amarillo  plido. Usar medicamentos recetados o de Sales promotion account executive. Consumir alimentos ricos en fibra, como frijoles, cereales integrales, y frutas y verduras frescas. Limitar el consumo de alimentos ricos en grasa y azcares, como los alimentos fritos o dulces. Indicaciones generales No trate de reintroducir la hernia. Baje de peso si se lo indic el mdico. Observe si la hernia cambia de color o de tamao. Infrmele al mdico si se producen cambios. Tal vez deba evitar las actividades que ejercen presin sobre la hernia. Es posible que Personnel officer objetos. Pregntele al mdico cunto peso puede levantar sin correr [employer]. Use los medicamentos de venta libre y los recetados solamente como se lo haya indicado el mdico. Comunquese con un mdico si: La hernia se agranda o se endurece. La hernia le causa dolor. Tiene fiebre o escalofros. Solicite ayuda de inmediato si: Siente un dolor muy intenso cerca de la zona de la hernia, y el dolor aparece repentinamente. Tiene dolor y vomita o siente ganas de Biochemist, clinical. La piel que recubre la hernia cambia de color. Estos sntomas pueden Customer service manager. Solicite ayuda de inmediato. Llame al 911. No espere hasta que los sntomas desaparezcan. No conduzca por sus propios medios [employer]. Esta informacin no tiene Theme park manager el consejo del mdico. Asegrese de hacerle al mdico cualquier pregunta que tenga. Document Revised: 05/20/2022 Document Reviewed: 05/20/2022 Elsevier Patient Education  2024 [employer].

## 2022-11-09 ENCOUNTER — Encounter: Payer: Self-pay | Admitting: Surgery

## 2022-11-09 NOTE — Progress Notes (Signed)
11/09/2022  History of Present Illness: Gabriel Ball is a 52 y.o. male presenting for follow up of his umbilical hernia.  He was last seen on 09/06/22.  He had been originally scheduled for surgery on 09/02/22 but canceled due to URI symptoms.  Unfortunately, he has continued having persistent cough since then.  He has been treated for oral ulcers, has seen ENT, and now has a referral/appointment pending for Pulmonology, with appointment scheduled in 01/10/23.  The patient has been given an inhaler but he says that he gets chest pain and headache when he uses it so he has stopped.  He reports the cough is mostly dry but sometimes can bring phlegm.  He feels the most irritation is in his throat area.  Denies any fevers, chills, or sweats.  From the hernia standpoint, he reports that it is stable.  He still gets the sensation of bulging/pulling, but denies any actual pain or any worsening pain.  Does not feel that it is enlarging.  Past Medical History: Past Medical History:  Diagnosis Date   Anxiety    Asthma    GERD (gastroesophageal reflux disease)    Herpes    Shingles    Umbilical hernia      Past Surgical History: Past Surgical History:  Procedure Laterality Date   MOUTH SURGERY      Home Medications: Prior to Admission medications   Medication Sig Start Date End Date Taking? Authorizing Provider  benzonatate (TESSALON) 100 MG capsule Take 1 capsule (100 mg total) by mouth every 8 (eight) hours. 10/11/22  Yes Susann Givens, Carley A, NP  budesonide-formoterol (SYMBICORT) 160-4.5 MCG/ACT inhaler Inhale 2 puffs into the lungs 2 (two) times daily. Inhala 2 inhalaciones 2 veces al da 10/04/22  Yes Claiborne Rigg, NP  carbamide peroxide (DEBROX) 6.5 % OTIC solution place 5 drops into the right ear 2 times daily as needed 08/11/22  Yes Claiborne Rigg, NP  cetirizine (ZYRTEC) 10 MG tablet Take 1 tablet (10 mg total) by mouth daily. tomar 1 tableta por va oral al da for allergies  10/04/22  Yes Claiborne Rigg, NP  fluticasone (FLONASE) 50 MCG/ACT nasal spray Place 2 sprays into both nostrils daily. 2 pulverizaciones en ambas fosas nasales al da 10/04/22  Yes Claiborne Rigg, NP  hydrocortisone cream 1 % Apply to affected area 2 times daily 10/11/22  Yes Susann Givens, Carley A, NP  montelukast (SINGULAIR) 10 MG tablet Take 1 tablet (10 mg total) by mouth at bedtime. tomar 1 comprimido por va oral antes de acostarse para las alergias 10/04/22  Yes Claiborne Rigg, NP  polyethylene glycol powder (GLYCOLAX/MIRALAX) 17 GM/SCOOP powder Mix 17 grams in 4-8 ounces of water/juice and take by mouth 2 times daily as needed 08/25/22  Yes Claiborne Rigg, NP  omeprazole (PRILOSEC) 20 MG capsule Take 1 capsule (20 mg total) by mouth 2 (two) times daily for 14 days. 09/07/22 09/22/22  Domenick Gong, MD  SUMAtriptan (IMITREX) 50 MG tablet Take one for headache.  May repeat in 2 hours.  No more than 2 a day 05/27/17 12/09/21  Eustace Moore, MD    Allergies: Allergies  Allergen Reactions   Cyclobenzaprine     Facial Numbness/dizziness/hypersomnolence   Ibuprofen Hives   Methocarbamol     Abdominal pain   Amoxicillin Itching    Pruritic rash   Penicillins Palpitations    Has patient had a PCN reaction causing immediate rash, facial/tongue/throat swelling, SOB or lightheadedness with hypotension: No  Has patient had a PCN reaction causing severe rash involving mucus membranes or skin necrosis: No Has patient had a PCN reaction that required hospitalization: No Has patient had a PCN reaction occurring within the last 10 years: No If all of the above answers are "NO", then may proceed with Cephalosporin use.    Review of Systems: Review of Systems  Constitutional:  Negative for chills and fever.  Respiratory:  Positive for cough. Negative for shortness of breath.   Cardiovascular:  Negative for chest pain.  Gastrointestinal:  Positive for abdominal pain (discomfort/pulling at  umbilicus). Negative for nausea and vomiting.    Physical Exam BP 110/73   Pulse 81   Temp 98 F (36.7 C)   Ht 5\' 6"  (1.676 m)   Wt 185 lb (83.9 kg)   SpO2 97%   BMI 29.86 kg/m  CONSTITUTIONAL: No acute distress, well nourished. HEENT:  Normocephalic, atraumatic, extraocular motion intact. RESPIRATORY:  Lungs are clear, and breath sounds are equal bilaterally. Normal respiratory effort without pathologic use of accessory muscles. CARDIOVASCULAR: Heart is regular without murmurs, gallops, or rubs. GI: The abdomen is soft, non-distended, with mild discomfort to palpation at the umbilicus itself.  The hernia is reducible, remains stable size 2 cm, with some discomfort when pushing deeply.  No evidence of strangulation. NEUROLOGIC:  Motor and sensation is grossly normal.  Cranial nerves are grossly intact. PSYCH:  Alert and oriented to person, place and time. Affect is normal.   Assessment and Plan: This is a 52 y.o. male with reducible umbilical hernia.  --Discussed with the patient the possible options moving forward.  He's had negative workup for his cough so far, with a negative chest xray last month, and has been seen by ENT already.  He has a referral to pulmonology and appointment on 01/10/23.  Discussed that we could try moving forward with surgery vs waiting for pulmonology to see him first.  He is very nervous about surgery and is very worried about intubation with his cough, and he would rather wait.  --Recommended that he wear an abdominal binder to help the hernia bulging.  Also recommended avoid strenuous activity. --Follow up early January to hopefully schedule surgery then.  I spent 20 minutes dedicated to the care of this patient on the date of this encounter to include pre-visit review of records, face-to-face time with the patient discussing diagnosis and management, and any post-visit coordination of care.   [name], MD [facility]

## 2022-11-11 ENCOUNTER — Other Ambulatory Visit: Payer: Self-pay

## 2022-11-25 ENCOUNTER — Other Ambulatory Visit: Payer: Self-pay

## 2022-11-25 ENCOUNTER — Ambulatory Visit (HOSPITAL_COMMUNITY)
Admission: EM | Admit: 2022-11-25 | Discharge: 2022-11-25 | Disposition: A | Discharge status: Home / Self Care | Payer: Self-pay | Attending: Family Medicine | Admitting: Family Medicine

## 2022-11-25 ENCOUNTER — Encounter (HOSPITAL_COMMUNITY): Payer: Self-pay | Admitting: *Deleted

## 2022-11-25 DIAGNOSIS — R519 Headache, unspecified: Secondary | ICD-10-CM

## 2022-11-25 DIAGNOSIS — J3489 Other specified disorders of nose and nasal sinuses: Secondary | ICD-10-CM

## 2022-11-25 NOTE — ED Provider Notes (Signed)
[facility]    CSN: [id number] Arrival date & time: 11/25/22  0850      History   Chief Complaint Chief Complaint  Patient presents with   Headache   nasal dryness    [name] is a 52 y.o. male.    Headache Associated symptoms: no congestion    Spanish interpreter used today.  Patient is here for nasal dryness and irritated x  3-4 days.  No runny nose, congestion or drainage.  He uses flonase daily, and yesterday he noted a muciporin cream at the nose.   No sores/lesions noted.   2 days ago he noted pressure in the front and back of the head.  He uses hydroxyzine for headaches, which is helpful.        Past Medical History:  Diagnosis Date   Anxiety    Asthma    GERD (gastroesophageal reflux disease)    Herpes    Shingles    Umbilical hernia     Patient Active Problem List   Diagnosis Date Noted   Acute bilateral low back pain without sciatica 06/05/2020   Acute pain of right shoulder 06/05/2020   Asthma due to environmental allergies 12/20/2016   HSV (herpes simplex virus) infection 02/24/2015   Anxiety 04/02/2014    Past Surgical History:  Procedure Laterality Date   MOUTH SURGERY         Home Medications    Prior to Admission medications   Medication Sig Start Date End Date Taking? Authorizing Provider  fluticasone (FLONASE) 50 MCG/ACT nasal spray Place 2 sprays into both nostrils daily. 2 pulverizaciones en ambas fosas nasales al da 10/04/22  Yes Claiborne Rigg, NP  hydrOXYzine (ATARAX) 25 MG tablet Take 25 mg by mouth 3 (three) times daily as needed for anxiety.   Yes [provider]  benzonatate (TESSALON) 100 MG capsule Take 1 capsule (100 mg total) by mouth every 8 (eight) hours. 10/11/22   Wynonia Lawman A, NP  budesonide-formoterol (SYMBICORT) 160-4.5 MCG/ACT inhaler Inhale 2 puffs into the lungs 2 (two) times daily. Inhala 2 inhalaciones 2 veces al da 10/04/22   Claiborne Rigg, NP  carbamide  peroxide (DEBROX) 6.5 % OTIC solution place 5 drops into the right ear 2 times daily as needed 08/11/22   Claiborne Rigg, NP  cetirizine (ZYRTEC) 10 MG tablet Take 1 tablet (10 mg total) by mouth daily. tomar 1 tableta por va oral al da for allergies 10/04/22   Claiborne Rigg, NP  hydrocortisone cream 1 % Apply to affected area 2 times daily 10/11/22   Wynonia Lawman A, NP  montelukast (SINGULAIR) 10 MG tablet Take 1 tablet (10 mg total) by mouth at bedtime. tomar 1 comprimido por va oral antes de acostarse para las alergias 10/04/22   Claiborne Rigg, NP  omeprazole (PRILOSEC) 20 MG capsule Take 1 capsule (20 mg total) by mouth 2 (two) times daily for 14 days. 09/07/22 09/22/22  Domenick Gong, MD  polyethylene glycol powder (GLYCOLAX/MIRALAX) 17 GM/SCOOP powder Mix 17 grams in 4-8 ounces of water/juice and take by mouth 2 times daily as needed 08/25/22   Claiborne Rigg, NP  SUMAtriptan (IMITREX) 50 MG tablet Take one for headache.  May repeat in 2 hours.  No more than 2 a day 05/27/17 12/09/21  Eustace Moore, MD    Family History Family History  Problem Relation Age of Onset   Cancer Father     Social History Social History  Tobacco Use   Smoking status: Former    Passive exposure: Past   Smokeless tobacco: Never  Vaping Use   Vaping status: Never Used  Substance Use Topics   Alcohol use: No   Drug use: No     Allergies   Cyclobenzaprine, Ibuprofen, Methocarbamol, Amoxicillin, and Penicillins   Review of Systems Review of Systems  Constitutional: Negative.   HENT:  Negative for congestion.   Respiratory: Negative.    Cardiovascular: Negative.   Gastrointestinal: Negative.   Musculoskeletal: Negative.   Neurological:  Positive for headaches.     Physical Exam Triage Vital Signs ED Triage Vitals  Encounter Vitals Group     BP 11/25/22 0914 112/72     Systolic BP Percentile --      Diastolic BP Percentile --      Pulse Rate 11/25/22 0914 88     Resp  11/25/22 0914 16     Temp 11/25/22 0914 98.1 F (36.7 C)     Temp Source 11/25/22 0914 Oral     SpO2 11/25/22 0914 97 %     Weight --      Height --      Head Circumference --      Peak Flow --      Pain Score 11/25/22 0912 4     Pain Loc --      Pain Education --      Exclude from Growth Chart --    No data found.  Updated Vital Signs BP 112/72 (BP Location: Right Arm)   Pulse 88   Temp 98.1 F (36.7 C) (Oral)   Resp 16   SpO2 97%   Visual Acuity Right Eye Distance:   Left Eye Distance:   Bilateral Distance:    Right Eye Near:   Left Eye Near:    Bilateral Near:     Physical Exam Constitutional:      General: He is not in acute distress.    Appearance: He is well-developed. He is not ill-appearing.  HENT:     Head: Normocephalic.     Mouth/Throat:     Mouth: Mucous membranes are moist.  Cardiovascular:     Rate and Rhythm: Normal rate and regular rhythm.     Heart sounds: Normal heart sounds.  Pulmonary:     Effort: Pulmonary effort is normal.     Breath sounds: Normal breath sounds.  Musculoskeletal:        General: Normal range of motion.     Cervical back: Normal range of motion and neck supple.  Lymphadenopathy:     Cervical: No cervical adenopathy.  Skin:    General: Skin is warm and dry.  Neurological:     Mental Status: He is alert.  Psychiatric:        Mood and Affect: Mood normal.      UC Treatments / Results  Labs (all labs ordered are listed, but only abnormal results are displayed) Labs Reviewed - No data to display  EKG   Radiology No results found.  Procedures Procedures (including critical care time)  Medications Ordered in UC Medications - No data to display  Initial Impression / Assessment and Plan / UC Course  I have reviewed the triage vital signs and the nursing notes.  Pertinent labs & imaging results that were available during my care of the patient were reviewed by me and considered in my medical decision making  (see chart for details).   Final Clinical Impressions(s) / UC  Diagnoses   Final diagnoses:  Sinus headache  Nasal dryness     Discharge Instructions      Lo atendieron hoy por dolor de cabeza y sequedad nasal.  No veo evidencia de infeccin en este momento.  Le recomiendo que contine con el spray nasal flonasa.  Te recomiendo que tambin agregues un aerosol nasal salino para ayudar con la sequedad.  Por favor regrese si no mejora.  You were seen today for headache and nasal dryness.  I see no evidence of infection at this time.  I recommend you continue flonase nasal spray.  I recommend you add a saline nasal spray as well to help with dryness.  Please return if not improving.     ED Prescriptions   None    PDMP not reviewed this encounter.   Jannifer Franklin, MD 11/25/22 [phone]

## 2022-11-25 NOTE — ED Triage Notes (Signed)
Professional interpreter used for clinical intake.   Pt states he has dry nose, headache X 3 days. He states he takes hydroxyzine for headaches and is using nasal spray twice a day.

## 2022-11-25 NOTE — Discharge Instructions (Addendum)
Lo atendieron hoy por dolor de [place] y sequedad nasal.  No veo evidencia de infeccin en este momento.  Le recomiendo que contine con el spray nasal flonasa.  Te recomiendo que tambin agregues un aerosol nasal salino para ayudar con la sequedad.  Por favor regrese si no mejora.  You were seen today for headache and nasal dryness.  I see no evidence of infection at this time.  I recommend you continue flonase nasal spray.  I recommend you add a saline nasal spray as well to help with dryness.  Please return if not improving.

## 2023-01-05 ENCOUNTER — Other Ambulatory Visit: Payer: Self-pay

## 2023-01-05 ENCOUNTER — Other Ambulatory Visit: Payer: Self-pay | Admitting: Nurse Practitioner

## 2023-01-05 DIAGNOSIS — K5904 Chronic idiopathic constipation: Secondary | ICD-10-CM

## 2023-01-07 ENCOUNTER — Other Ambulatory Visit: Payer: Self-pay

## 2023-01-10 ENCOUNTER — Encounter: Payer: Self-pay | Admitting: Pulmonary Disease

## 2023-01-10 ENCOUNTER — Other Ambulatory Visit: Payer: Self-pay

## 2023-01-10 ENCOUNTER — Ambulatory Visit: Payer: Self-pay | Admitting: Pulmonary Disease

## 2023-01-10 VITALS — BP 110/72 | HR 83 | Temp 98.3°F | Ht 66.0 in | Wt 192.6 lb

## 2023-01-10 DIAGNOSIS — J984 Other disorders of lung: Secondary | ICD-10-CM

## 2023-01-10 DIAGNOSIS — J302 Other seasonal allergic rhinitis: Secondary | ICD-10-CM

## 2023-01-10 DIAGNOSIS — R053 Chronic cough: Secondary | ICD-10-CM

## 2023-01-10 MED ORDER — FLUTICASONE PROPIONATE 50 MCG/ACT NA SUSP
2.0000 | Freq: Every day | NASAL | 11 refills | Status: DC
  Filled 2023-01-10: qty 16, 30d supply, fill #0

## 2023-01-10 MED ORDER — FEXOFENADINE HCL 180 MG PO TABS
180.0000 mg | ORAL_TABLET | Freq: Every day | ORAL | 11 refills | Status: DC
Start: 2023-01-10 — End: 2023-09-11
  Filled 2023-01-10: qty 30, 30d supply, fill #0

## 2023-01-10 NOTE — Patient Instructions (Addendum)
I think your cough is from seasonal allergies  Recommend taking fexofenadine 180mg  daily for cough symptoms due to allergies  Recommend using fluticasone nasal spray (flonase), 1 spray per nostril daily  Your breathing tests from 2018 are within normal range.   Follow up in 6 months with complete pulmonary function tests, call sooner if needed

## 2023-01-10 NOTE — Progress Notes (Signed)
Synopsis: Referred in December 2024 for chronic cough by Jovita Kussmaul, MD  Subjective:   PATIENT ID: Gabriel Ball GENDER: male DOB: 12/18/1970, MRN: [id number]   HPI  Chief Complaint  Patient presents with   Cough    Cough for 3 months, currently better   Gabriel Ball is a 52 year old male, never smoker with GERD and asthma who is referred to pulmonary clinic for chronic cough.   He reports having cough for 3 months but has been better over the past month. He denies chest tightness, wheezing or shortness of breath with the cough. He denies sputum production. He reports seasonal allergies in the summertime with watery eyes and nasal congestion. He denies post-nasal drainage or reflux. He tried albuterol inhaler in the past for the cough without improvement.   PFTs in 2018 are unremarkable. Chest radiograph 09/2022 is unremarkable.   He is a never smoker. 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 He tried smoking when he was younger. No issues in his breathing in childhood.   Past Medical History:  Diagnosis Date   Anxiety    Asthma    GERD (gastroesophageal reflux disease)    Herpes    Shingles    Umbilical hernia      Family History  Problem Relation Age of Onset   Cancer Father      Social History   Socioeconomic History   Marital status: Significant Other    Spouse name: Not on file   Number of children: Not on file   Years of education: Not on file   Highest education level: Not on file  Occupational History   Not on file  Tobacco Use   Smoking status: Former    Passive exposure: Past   Smokeless tobacco: Never  Vaping Use   Vaping status: Never Used  Substance and Sexual Activity   Alcohol use: No   Drug use: No   Sexual activity: Yes  Other Topics Concern   Not on file  Social History Narrative   Single and lives with daughter and significant other.    Social Drivers of  Corporate investment banker Strain: Not on file  Food Insecurity: Not on file  Transportation Needs: Not on file  Physical Activity: Not on file  Stress: Not on file  Social Connections: Not on file  Intimate Partner Violence: Not on file     Allergies  Allergen Reactions   Cyclobenzaprine     Facial Numbness/dizziness/hypersomnolence   Ibuprofen Hives   Methocarbamol     Abdominal pain   Amoxicillin Itching    Pruritic rash   Penicillins Palpitations    Has patient had a PCN reaction causing immediate rash, facial/tongue/throat swelling, SOB or lightheadedness with hypotension: No Has patient had a PCN reaction causing severe rash involving mucus membranes or skin necrosis: No Has patient had a PCN reaction that required hospitalization: No Has patient had a PCN reaction occurring within the last 10 years: No If all of the above answers are "NO", then may proceed with Cephalosporin use.     Outpatient Medications Prior to Visit  Medication Sig Dispense Refill   hydrocortisone cream 1 % Apply to affected area 2 times daily (Patient not taking: Reported on 01/10/2023) 15 g 0   hydrOXYzine (ATARAX) 25 MG tablet Take 25 mg by mouth 3 (three) times daily as needed for anxiety. (  Patient not taking: Reported on 01/10/2023)     polyethylene glycol powder (GLYCOLAX/MIRALAX) 17 GM/SCOOP powder Mix 17 grams in 4-8 ounces of water/juice and take by mouth 2 times daily as needed (Patient not taking: Reported on 01/10/2023) 510 g 3   fluticasone (FLONASE) 50 MCG/ACT nasal spray Place 2 sprays into both nostrils daily. 2 pulverizaciones en ambas fosas nasales al da (Patient not taking: Reported on 01/10/2023) 16 g 1   No facility-administered medications prior to visit.    Review of Systems  Constitutional:  Negative for chills, fever, malaise/fatigue and weight loss.  HENT:  Negative for congestion, sinus pain and sore throat.   Eyes: Negative.   Respiratory:  Positive for cough.  Negative for hemoptysis, sputum production, shortness of breath and wheezing.   Cardiovascular:  Negative for chest pain, palpitations, orthopnea, claudication and leg swelling.  Gastrointestinal:  Negative for abdominal pain, heartburn, nausea and vomiting.  Genitourinary: Negative.   Musculoskeletal:  Negative for joint pain and myalgias.  Skin:  Negative for rash.  Neurological:  Negative for weakness.  Endo/Heme/Allergies:  Positive for environmental allergies.  Psychiatric/Behavioral: Negative.      Objective:   Vitals:   01/10/23 0830  BP: 110/72  Pulse: 83  Temp: 98.3 F (36.8 C)  TempSrc: Oral  SpO2: 97%  Weight: 192 lb 9.6 oz (87.4 kg)  Height: 5\' 6"  (1.676 m)   Physical Exam Constitutional:      General: He is not in acute distress.    Appearance: Normal appearance.  Eyes:     General: No scleral icterus.    Conjunctiva/sclera: Conjunctivae normal.  Cardiovascular:     Rate and Rhythm: Normal rate and regular rhythm.  Pulmonary:     Breath sounds: No wheezing, rhonchi or rales.  Musculoskeletal:     Right lower leg: No edema.     Left lower leg: No edema.  Skin:    General: Skin is warm and dry.  Neurological:     General: No focal deficit present.    CBC    Component Value Date/Time   WBC 5.6 08/09/2022 1551   RBC 5.57 08/09/2022 1551   HGB 16.4 08/09/2022 1551   HGB 16.1 09/11/2021 0836   HCT 47.9 08/09/2022 1551   HCT 48.0 09/11/2021 0836   PLT 239 08/09/2022 1551   PLT 193 09/11/2021 0836   MCV 86.0 08/09/2022 1551   MCV 88 09/11/2021 0836   MCH 29.4 08/09/2022 1551   MCHC 34.2 08/09/2022 1551   RDW 12.8 08/09/2022 1551   RDW 13.5 09/11/2021 0836   LYMPHSABS 1.2 08/09/2022 1551   LYMPHSABS 1.8 09/11/2021 0836   MONOABS 0.4 08/09/2022 1551   EOSABS 0.1 08/09/2022 1551   EOSABS 0.2 09/11/2021 0836   BASOSABS 0.0 08/09/2022 1551   BASOSABS 0.0 09/11/2021 0836      Latest Ref Rng & Units 08/09/2022    3:51 PM 07/31/2022    9:31 PM  07/12/2022   11:26 AM  BMP  Glucose 70 - 99 mg/dL 81  409  811   BUN 6 - 20 mg/dL 11  9  11    Creatinine 0.61 - 1.24 mg/dL 9.14  7.82  9.56   Sodium 135 - 145 mmol/L 137  136  139   Potassium 3.5 - 5.1 mmol/L 4.1  3.3  3.7   Chloride 98 - 111 mmol/L 105  107  108   CO2 22 - 32 mmol/L 22  22  25    Calcium 8.9 -  10.3 mg/dL 9.0  8.7  8.7    Chest imaging: CXR 09/29/22 Normal lung volumes. No focal consolidations. No pleural effusion or pneumothorax. The heart size and mediastinal contours are within normal limits. No acute osseous abnormality  PFT:    Latest Ref Rng & Units 12/23/2016    9:53 AM  PFT Results  FVC-Pre L 4.28   FVC-Predicted Pre % 94   Pre FEV1/FVC % % 85   FEV1-Pre L 3.64   FEV1-Predicted Pre % 101   DLCO uncorrected ml/min/mmHg 35.05   DLCO UNC% % 130   DLVA Predicted % 135     Labs:  Path:  Echo:  Heart Catheterization:    Assessment & Plan:   Chronic cough - Plan: fexofenadine (ALLEGRA) 180 MG tablet, fluticasone (FLONASE) 50 MCG/ACT nasal spray, Pulmonary Function Test  Seasonal allergies - Plan: fexofenadine (ALLEGRA) 180 MG tablet, fluticasone (FLONASE) 50 MCG/ACT nasal spray  Restrictive airway disease  Discussion: Ted Bargeron is a 52 year old male, never smoker with GERD and asthma who is referred to pulmonary clinic for chronic cough.   Chronic Cough Cough lasting for three months, improved over the past month. No current phlegm production. History of similar cough 10 years ago, initially diagnosed as asthma, later found to be pneumonia. No current wheezing or shortness of breath. -Consider potential causes including sinus disease, reflux, and asthma. - take fexofenadine 180mg  daily - use flonase 1 spray per nostril daily - will consider inhaler therapy in the future  Seasonal Allergies Reports symptoms during the summer season, including runny eyes. -Consider use of antihistamines or nasal steroids during the summer season to manage  symptoms.  Follow up in 6 months with full PFTs.  Melody Comas, MD [facility] Office: [phone]   Current Outpatient Medications:    fexofenadine (ALLEGRA) 180 MG tablet, Take 1 tablet (180 mg total) by mouth daily., Disp: 30 tablet, Rfl: 11   fluticasone (FLONASE) 50 MCG/ACT nasal spray, Place 2 sprays into both nostrils daily. 2 pulverizaciones en ambas fosas nasales al da, Disp: 16 g, Rfl: 11   hydrocortisone cream 1 %, Apply to affected area 2 times daily (Patient not taking: Reported on 01/10/2023), Disp: 15 g, Rfl: 0   hydrOXYzine (ATARAX) 25 MG tablet, Take 25 mg by mouth 3 (three) times daily as needed for anxiety. (Patient not taking: Reported on 01/10/2023), Disp: , Rfl:    polyethylene glycol powder (GLYCOLAX/MIRALAX) 17 GM/SCOOP powder, Mix 17 grams in 4-8 ounces of water/juice and take by mouth 2 times daily as needed (Patient not taking: Reported on 01/10/2023), Disp: 510 g, Rfl: 3

## 2023-01-13 ENCOUNTER — Other Ambulatory Visit: Payer: Self-pay

## 2023-01-17 ENCOUNTER — Ambulatory Visit: Payer: Self-pay | Attending: Nurse Practitioner | Admitting: Nurse Practitioner

## 2023-01-17 ENCOUNTER — Encounter: Payer: Self-pay | Admitting: Nurse Practitioner

## 2023-01-17 VITALS — BP 116/73 | HR 78 | Ht 66.0 in | Wt 192.6 lb

## 2023-01-17 DIAGNOSIS — E782 Mixed hyperlipidemia: Secondary | ICD-10-CM

## 2023-01-17 DIAGNOSIS — E876 Hypokalemia: Secondary | ICD-10-CM

## 2023-01-17 DIAGNOSIS — B009 Herpesviral infection, unspecified: Secondary | ICD-10-CM

## 2023-01-17 DIAGNOSIS — K649 Unspecified hemorrhoids: Secondary | ICD-10-CM

## 2023-01-17 DIAGNOSIS — Z130 Encounter for screening for diseases of the blood and blood-forming organs and certain disorders involving the immune mechanism: Secondary | ICD-10-CM

## 2023-01-17 MED ORDER — POLYETHYLENE GLYCOL 3350 17 GM/SCOOP PO POWD: ORAL | 6 refills | Status: DC

## 2023-01-17 MED ORDER — VALACYCLOVIR HCL 1 G PO TABS
1000.0000 mg | ORAL_TABLET | Freq: Every day | ORAL | 3 refills | Status: DC
Start: 2023-01-17 — End: 2023-09-11

## 2023-01-17 MED ORDER — LIDOCAINE (ANORECTAL) 5 % EX CREA
TOPICAL_CREAM | CUTANEOUS | 3 refills | Status: DC
Start: 2023-01-17 — End: 2023-09-11

## 2023-01-17 NOTE — Progress Notes (Signed)
Patient states he does not need appointment. Only wanted fasting labs drawn and will have to return tomorrow as he is not fasting this late in the afternoon. He called this morning to have labs drawn and appointment was made this afternoon with provider instead.

## 2023-01-18 ENCOUNTER — Ambulatory Visit: Payer: Self-pay | Attending: Family Medicine

## 2023-01-18 DIAGNOSIS — E782 Mixed hyperlipidemia: Secondary | ICD-10-CM

## 2023-01-18 DIAGNOSIS — E876 Hypokalemia: Secondary | ICD-10-CM

## 2023-01-18 DIAGNOSIS — Z130 Encounter for screening for diseases of the blood and blood-forming organs and certain disorders involving the immune mechanism: Secondary | ICD-10-CM

## 2023-01-19 LAB — CBC WITH DIFFERENTIAL/PLATELET
Basophils Absolute: 0 10*3/uL (ref 0.0–0.2)
Basos: 1 %
EOS (ABSOLUTE): 0.2 10*3/uL (ref 0.0–0.4)
Eos: 4 %
Hematocrit: 47 % (ref 37.5–51.0)
Hemoglobin: 15.8 g/dL (ref 13.0–17.7)
Immature Grans (Abs): 0 10*3/uL (ref 0.0–0.1)
Immature Granulocytes: 1 %
Lymphocytes Absolute: 2.1 10*3/uL (ref 0.7–3.1)
Lymphs: 33 %
MCH: 29.3 pg (ref 26.6–33.0)
MCHC: 33.6 g/dL (ref 31.5–35.7)
MCV: 87 fL (ref 79–97)
Monocytes Absolute: 0.4 10*3/uL (ref 0.1–0.9)
Monocytes: 7 %
Neutrophils Absolute: 3.6 10*3/uL (ref 1.4–7.0)
Neutrophils: 54 %
Platelets: 226 10*3/uL (ref 150–450)
RBC: 5.39 x10E6/uL (ref 4.14–5.80)
RDW: 13.7 % (ref 11.6–15.4)
WBC: 6.3 10*3/uL (ref 3.4–10.8)

## 2023-01-19 LAB — CMP14+EGFR
ALT: 35 [IU]/L (ref 0–44)
AST: 27 [IU]/L (ref 0–40)
Albumin: 4.5 g/dL (ref 3.8–4.9)
Alkaline Phosphatase: 93 [IU]/L (ref 44–121)
BUN/Creatinine Ratio: 14 (ref 9–20)
BUN: 11 mg/dL (ref 6–24)
Bilirubin Total: 0.5 mg/dL (ref 0.0–1.2)
CO2: 21 mmol/L (ref 20–29)
Calcium: 9.3 mg/dL (ref 8.7–10.2)
Chloride: 105 mmol/L (ref 96–106)
Creatinine, Ser: 0.81 mg/dL (ref 0.76–1.27)
Globulin, Total: 2.3 g/dL (ref 1.5–4.5)
Glucose: 90 mg/dL (ref 70–99)
Potassium: 4.2 mmol/L (ref 3.5–5.2)
Sodium: 141 mmol/L (ref 134–144)
Total Protein: 6.8 g/dL (ref 6.0–8.5)
eGFR: 107 mL/min/{1.73_m2} (ref >59)

## 2023-01-19 LAB — LIPID PANEL
Chol/HDL Ratio: 7.1 {ratio} — ABNORMAL HIGH (ref 0.0–5.0)
Cholesterol, Total: 200 mg/dL — ABNORMAL HIGH (ref 100–199)
HDL: 28 mg/dL — ABNORMAL LOW (ref >39)
LDL Chol Calc (NIH): 145 mg/dL — ABNORMAL HIGH (ref 0–99)
Triglycerides: 148 mg/dL (ref 0–149)
VLDL Cholesterol Cal: 27 mg/dL (ref 5–40)

## 2023-01-24 ENCOUNTER — Emergency Department (HOSPITAL_COMMUNITY)
Admission: EM | Admit: 2023-01-24 | Discharge: 2023-01-24 | Disposition: A | Discharge status: Home / Self Care | Payer: Self-pay | Attending: Emergency Medicine | Admitting: Emergency Medicine

## 2023-01-24 ENCOUNTER — Other Ambulatory Visit: Payer: Self-pay

## 2023-01-24 ENCOUNTER — Emergency Department (HOSPITAL_COMMUNITY): Payer: Self-pay

## 2023-01-24 DIAGNOSIS — R140 Abdominal distension (gaseous): Secondary | ICD-10-CM | POA: Insufficient documentation

## 2023-01-24 LAB — URINALYSIS, ROUTINE W REFLEX MICROSCOPIC
Bilirubin Urine: NEGATIVE
Glucose, UA: NEGATIVE mg/dL
Hgb urine dipstick: NEGATIVE
Ketones, ur: NEGATIVE mg/dL
Leukocytes,Ua: NEGATIVE
Nitrite: NEGATIVE
Protein, ur: NEGATIVE mg/dL
Specific Gravity, Urine: 1.021 (ref 1.005–1.030)
pH: 6 (ref 5.0–8.0)

## 2023-01-24 LAB — COMPREHENSIVE METABOLIC PANEL
ALT: 29 U/L (ref 0–44)
AST: 27 U/L (ref 15–41)
Albumin: 4.5 g/dL (ref 3.5–5.0)
Alkaline Phosphatase: 71 U/L (ref 38–126)
Anion gap: 8 (ref 5–15)
BUN: 9 mg/dL (ref 6–20)
CO2: 24 mmol/L (ref 22–32)
Calcium: 9.1 mg/dL (ref 8.9–10.3)
Chloride: 105 mmol/L (ref 98–111)
Creatinine, Ser: 0.82 mg/dL (ref 0.61–1.24)
GFR, Estimated: >60 mL/min (ref >60)
Glucose, Bld: 104 mg/dL — ABNORMAL HIGH (ref 70–99)
Potassium: 3.5 mmol/L (ref 3.5–5.1)
Sodium: 137 mmol/L (ref 135–145)
Total Bilirubin: 0.8 mg/dL (ref 0.0–1.2)
Total Protein: 7.5 g/dL (ref 6.5–8.1)

## 2023-01-24 LAB — CBC
HCT: 45.3 % (ref 39.0–52.0)
Hemoglobin: 15.9 g/dL (ref 13.0–17.0)
MCH: 29.9 pg (ref 26.0–34.0)
MCHC: 35.1 g/dL (ref 30.0–36.0)
MCV: 85.2 fL (ref 80.0–100.0)
Platelets: 231 10*3/uL (ref 150–400)
RBC: 5.32 MIL/uL (ref 4.22–5.81)
RDW: 13.4 % (ref 11.5–15.5)
WBC: 6 10*3/uL (ref 4.0–10.5)
nRBC: 0 % (ref 0.0–0.2)

## 2023-01-24 LAB — LIPASE, BLOOD: Lipase: 36 U/L (ref 11–51)

## 2023-01-24 MED ORDER — IOHEXOL 300 MG/ML  SOLN
100.0000 mL | Freq: Once | INTRAMUSCULAR | Status: AC | PRN
  Administered 2023-01-24: 100 mL via INTRAVENOUS

## 2023-01-24 NOTE — ED Provider Triage Note (Signed)
 Emergency Medicine Provider Triage Evaluation Note  Jariah Jarmon , a 54 y.o. male  was evaluated in triage.  Pt complains of abdominal pain.  Review of Systems  Positive:  Negative:   Physical Exam  BP 115/66   Pulse 70   Temp 98.4 F (36.9 C) (Oral)   Resp 16   Ht 5' 6 (1.676 m)   Wt 87 kg   SpO2 99%   BMI 30.96 kg/m  Gen:   Awake, no distress   Resp:  Normal effort  MSK:   Moves extremities without difficulty  Other:    Medical Decision Making  Medically screening exam initiated at 10:17 AM.  Appropriate orders placed.  Larz Mark was informed that the remainder of the evaluation will be completed by another provider, this initial triage assessment does not replace that evaluation, and the importance of remaining in the ED until their evaluation is complete.  Generalized abdominal pain and bloating x3 days. Denies nausea, vomiting, diarrhea, hematochezia.    Hoy Fraction F, [place] 01/24/23 1019

## 2023-01-24 NOTE — Discharge Instructions (Signed)
 Return for any problem.  ?

## 2023-01-24 NOTE — ED Triage Notes (Signed)
 Pt arrived via POV. C/o abd bloating and pain for several days. Limited appetite.  Passing gas and having bowel movements

## 2023-01-24 NOTE — ED Provider Notes (Signed)
 [place] EMERGENCY DEPARTMENT AT [facility] Provider Note   CSN: [id number] Arrival date & time: 01/24/23  9082     History  Chief Complaint  Patient presents with   Abdominal Pain   Bloated    Gabriel Ball is a 53 y.o. male.  53 year old male with prior medical history as detailed below presents for evaluation.  Patient complains of approximately 3 days of diffuse abdominal cramping and bloating.  He denies associated fever, nausea, vomiting, bowel movement change.  He denies back pain.  The history is provided by the patient and medical records.       Home Medications Prior to Admission medications   Medication Sig Start Date End Date Taking? Authorizing Provider  fexofenadine  (ALLEGRA ) 180 MG tablet Take 1 tablet (180 mg total) by mouth daily. 01/10/23   Kara Dorn NOVAK, MD  fluticasone  (FLONASE ) 50 MCG/ACT nasal spray Place 2 sprays into both nostrils daily. 2 pulverizaciones en ambas fosas nasales al da 01/10/23   Kara Dorn NOVAK, MD  hydrocortisone  cream 1 % Apply to affected area 2 times daily 10/11/22   Johnie Flaming A, NP  hydrOXYzine  (ATARAX ) 25 MG tablet Take 25 mg by mouth 3 (three) times daily as needed for anxiety. Patient not taking: Reported on 01/10/2023    [provider]  Lidocaine , Anorectal, 5 % CREA Apply rectally as needed 01/17/23   Fleming, Zelda W, NP  polyethylene glycol powder (GLYCOLAX /MIRALAX ) 17 GM/SCOOP powder Mix 17 grams in 4-8 ounces of water/juice and take by mouth 2 times daily as needed 01/17/23   Fleming, Zelda W, NP  valACYclovir  (VALTREX ) 1000 MG tablet Take 1 tablet (1,000 mg total) by mouth daily. 01/17/23   Fleming, Zelda W, NP  SUMAtriptan  (IMITREX ) 50 MG tablet Take one for headache.  May repeat in 2 hours.  No more than 2 a day 05/27/17 12/09/21  Maranda Jamee Jacob, MD      Allergies    Cyclobenzaprine , Ibuprofen , Methocarbamol , Amoxicillin , and Penicillins    Review of Systems   Review of  Systems  All other systems reviewed and are negative.   Physical Exam Updated Vital Signs BP 115/66   Pulse 70   Temp 98.4 F (36.9 C) (Oral)   Resp 16   Ht 5' 6 (1.676 m)   Wt 87 kg   SpO2 99%   BMI 30.96 kg/m  Physical Exam Vitals and nursing note reviewed.  Constitutional:      General: He is not in acute distress.    Appearance: Normal appearance. He is well-developed.  HENT:     Head: Normocephalic and atraumatic.  Eyes:     Conjunctiva/sclera: Conjunctivae normal.     Pupils: Pupils are equal, round, and reactive to light.  Cardiovascular:     Rate and Rhythm: Normal rate and regular rhythm.     Heart sounds: Normal heart sounds.  Pulmonary:     Effort: Pulmonary effort is normal. No respiratory distress.     Breath sounds: Normal breath sounds.  Abdominal:     General: There is no distension.     Palpations: Abdomen is soft.     Tenderness: There is no abdominal tenderness.  Musculoskeletal:        General: No deformity. Normal range of motion.     Cervical back: Normal range of motion and neck supple.  Skin:    General: Skin is warm and dry.  Neurological:     General: No focal deficit present.  Mental Status: He is alert and oriented to person, place, and time.     ED Results / Procedures / Treatments   Labs (all labs ordered are listed, but only abnormal results are displayed) Labs Reviewed  COMPREHENSIVE METABOLIC PANEL - Abnormal; Notable for the following components:      Result Value   Glucose, Bld 104 (*)    All other components within normal limits  LIPASE, BLOOD  CBC  URINALYSIS, ROUTINE W REFLEX MICROSCOPIC    EKG None  Radiology No results found.  Procedures Procedures    Medications Ordered in ED Medications - No data to display  ED Course/ Medical Decision Making/ A&P                                 Medical Decision Making Amount and/or Complexity of Data Reviewed Labs: ordered.  Risk Prescription drug  management.    Medical Screen Complete  This patient presented to the ED with complaint of pain.  This complaint involves an extensive number of treatment options. The initial differential diagnosis includes, but is not limited to, obstruction, infection, hernia, etc.  This presentation is: Acute, Chronic, Self-Limited, Previously Undiagnosed, Uncertain Prognosis, Complicated, Systemic Symptoms, and Threat to Life/Bodily Function  Patient complains of generalized abdominal discomfort.  Patient with a history of umbilical hernia.  He feels that the symptoms may be related to same.  Patient's exam is not suggestive of hernia incarceration.  Imaging also does not reveal acute abnormality.  Patient is reassured by workup findings.  He understands need for close outpatient follow-up.  Strict return precautions given and understood.  Additional history obtained:  External records from outside sources obtained and reviewed including prior ED visits and prior Inpatient records.    Lab Tests:  I ordered and personally interpreted labs.  The pertinent results include: CBC, CMP, lipase, UA   Imaging Studies ordered:  I ordered imaging studies including the abdomen pelvis I independently visualized and interpreted obtained imaging which showed NAD I agree with the radiologist interpretation.   Problem List / ED Course:  Abdominal pain   Reevaluation:  After the interventions noted above, I reevaluated the patient and found that they have: improved   Disposition:  After consideration of the diagnostic results and the patients response to treatment, I feel that the patent would benefit from close outpatient follow-up.          Final Clinical Impression(s) / ED Diagnoses Final diagnoses:  Abdominal bloating    Rx / DC Orders ED Discharge Orders     None         Laurice Maude BROCKS, MD 01/25/23 2251

## 2023-01-26 ENCOUNTER — Ambulatory Visit: Payer: Self-pay | Admitting: Surgery

## 2023-01-26 ENCOUNTER — Encounter: Payer: Self-pay | Admitting: Surgery

## 2023-01-26 VITALS — BP 109/71 | HR 73 | Temp 98.0°F | Ht 66.0 in | Wt 189.0 lb

## 2023-01-26 DIAGNOSIS — K429 Umbilical hernia without obstruction or gangrene: Secondary | ICD-10-CM

## 2023-01-26 NOTE — Progress Notes (Signed)
 01/26/2023  History of Present Illness: Gabriel Ball is a 53 y.o. male presenting for follow-up of an umbilical hernia.  The patient was last seen on 11/08/2022 for this.  At that time, the patient continued having cough he was seen by pulmonology on 01/10/2023.  Today he reports that he has been doing better and his cough has been improving although he still has some mild cough.  Denies any significant symptoms from his umbilical hernia.  He reports that sometimes he feels full or bloated but otherwise denies any pain or bulging sensation.  He presented to the emergency room on 01/24/2023 for this and CT scan did not show any acute findings.  His umbilical hernia was stable containing only fatty tissue.  He is very worried about having surgery and is afraid that he is not going to wake up he has a young daughter that he has to take care of.  Past Medical History: Past Medical History:  Diagnosis Date   Anxiety    Asthma    GERD (gastroesophageal reflux disease)    Herpes    Shingles    Umbilical hernia      Past Surgical History: Past Surgical History:  Procedure Laterality Date   MOUTH SURGERY      Home Medications: Prior to Admission medications   Medication Sig Start Date End Date Taking? Authorizing Provider  hydrocortisone  cream 1 % Apply to affected area 2 times daily 10/11/22  Yes Johnie Flaming A, NP  Lidocaine , Anorectal, 5 % CREA Apply rectally as needed 01/17/23  Yes Fleming, Zelda W, NP  polyethylene glycol powder (GLYCOLAX /MIRALAX ) 17 GM/SCOOP powder Mix 17 grams in 4-8 ounces of water/juice and take by mouth 2 times daily as needed 01/17/23  Yes Fleming, Zelda W, NP  valACYclovir  (VALTREX ) 1000 MG tablet Take 1 tablet (1,000 mg total) by mouth daily. 01/17/23  Yes Theotis Haze ORN, NP  fexofenadine  (ALLEGRA ) 180 MG tablet Take 1 tablet (180 mg total) by mouth daily. Patient not taking: Reported on 01/26/2023 01/10/23   Kara Dorn NOVAK, MD  fluticasone  (FLONASE ) 50  MCG/ACT nasal spray Place 2 sprays into both nostrils daily. 2 pulverizaciones en ambas fosas nasales al da Patient not taking: Reported on 01/26/2023 01/10/23   Kara Dorn NOVAK, MD  hydrOXYzine  (ATARAX ) 25 MG tablet Take 25 mg by mouth 3 (three) times daily as needed for anxiety. Patient not taking: Reported on 01/26/2023    [provider]  SUMAtriptan  (IMITREX ) 50 MG tablet Take one for headache.  May repeat in 2 hours.  No more than 2 a day 05/27/17 12/09/21  Maranda Jamee Jacob, MD    Allergies: Allergies  Allergen Reactions   Cyclobenzaprine      Facial Numbness/dizziness/hypersomnolence   Ibuprofen  Hives   Methocarbamol      Abdominal pain   Amoxicillin  Itching    Pruritic rash   Penicillins Palpitations    Has patient had a PCN reaction causing immediate rash, facial/tongue/throat swelling, SOB or lightheadedness with hypotension: No Has patient had a PCN reaction causing severe rash involving mucus membranes or skin necrosis: No Has patient had a PCN reaction that required hospitalization: No Has patient had a PCN reaction occurring within the last 10 years: No If all of the above answers are NO, then may proceed with Cephalosporin use.    Review of Systems: Review of Systems  Constitutional:  Negative for chills and fever.  Respiratory:  Positive for cough. Negative for shortness of breath.   Cardiovascular:  Negative  for chest pain.  Gastrointestinal:  Negative for abdominal pain, nausea and vomiting.    Physical Exam BP 109/71   Pulse 73   Temp 98 F (36.7 C)   Ht 5' 6 (1.676 m)   Wt 189 lb (85.7 kg)   SpO2 98%   BMI 30.51 kg/m  CONSTITUTIONAL: No acute distress, well-nourished HEENT:  Normocephalic, atraumatic, extraocular motion intact. RESPIRATORY:  Normal respiratory effort without pathologic use of accessory muscles. CARDIOVASCULAR: Regular rhythm and rate. GI: The abdomen is soft, nondistended, nontender to palpation.  Patient has a reducible  umbilical hernia without any tenderness.  NEUROLOGIC:  Motor and sensation is grossly normal.  Cranial nerves are grossly intact. PSYCH:  Alert and oriented to person, place and time. Affect is normal.  Labs/Imaging: CT abdomen/pelvis on 01/24/2023: IMPRESSION: 1. No acute findings or explanation for the patient's symptoms. 2. Stable small umbilical hernia containing only fat. 3.  Aortic Atherosclerosis (ICD10-I70.0).  Assessment and Plan: This is a 53 y.o. male with a reducible umbilical hernia.  - Discussed with the patient that the umbilical hernia is overall stable and does not show any concerning features at this point.  Overall he has remained relatively asymptomatic with only some discomfort from time to time.  He has been trying to take it easy and not doing any heavy lifting or pushing.  Discussed with him that we could still proceed with repair of his umbilical hernia but the patient is still very worried about any potential issues and not waking up from anesthesia.  He reports that there is nobody else that would be able to take care of his daughter and would not want anything to happen to him.  Discussed with him that hernia repair and even general anesthesia is a low risk procedure but unfortunately there are possible risks although very minor. - At this point, given the patient's hesitation, I do not feel that it is reasonable to try to push for surgery given that his symptoms are relatively minor.  I think at this point is better to continue watchful waiting and avoiding strenuous activity.  Discussed with him that if any of his symptoms start progressing or worsening, then we can discuss further the possibility of surgery. - Patient is in agreement with this plan and all of his questions have been answered.  I spent 20 minutes dedicated to the care of this patient on the date of this encounter to include pre-visit review of records, face-to-face time with the patient discussing  diagnosis and management, and any post-visit coordination of care.   Aloysius Sheree Plant, MD [facility]

## 2023-01-26 NOTE — Patient Instructions (Addendum)
 Follow-up with our office as needed.  Please call and ask to speak with a nurse if you develop questions or concerns.  Seguimiento con nuestra oficina segn sea necesario.  Llame y solicite hablar con una enfermera si tiene preguntas o inquietudes.    Hernia umbilical en los adultos Umbilical Hernia, Adult  Una hernia es un bulto de tejido que sobresale a travs de una abertura [employer] corporation. Una hernia umbilical se forma en el vientre, cerca del ombligo. La hernia puede contener tejido del intestino delgado o del intestino grueso. Tambin puede contener el tejido graso que recubre los intestinos. Las hernias usaa en los adultos pueden empeorar con el [place]. Deben tratarse con ciruga. Hay varios tipos de hernias umbilicales. Incluyen los siguientes: Hernia indirecta. Esta ocurre justo por encima o por debajo del ombligo. Es el tipo de hernia umbilical ms frecuente en los adultos. Hernia directa. Este tipo ocurre en una abertura formada por el ombligo. Hernia reducible. Esta hernia viene y va. Es posible que se vea solamente al hacer fuerza, al toser o al levantar algo pesado. Este tipo de hernia se puede reintroducir en el vientre (reducir). Hernia encarcelada. La hernia queda atrapada en la pared del vientre. Este tipo de hernia no se puede reintroducir en el vientre. Puede causar una hernia estrangulada. Hernia estrangulada. Se trata de una hernia que interrumpe el flujo de sangre a los tejidos en su interior. Si esto ocurre, los tejidos bluelinx. Este tipo de hernia se debe tratar de inmediato. Cules son las causas? Una hernia umbilical aparece cuando el tejido que est dentro del vientre empuja a travs de una abertura en los msculos abdominales. Qu incrementa el riesgo? Es ms probable que tenga este tipo de hernia si: Hace fuerza al levantar o empujar objetos pesados. Tuvo varios embarazos. Tiene una afeccin que ejerce presin sobre el vientre y la ha tenido  durante mucho tiempo. Esto incluye lo siguiente: Obesidad. Acumulacin de lquido en el vientre. Vomita o tose todo [employer] industries. Dificultad para defecar (estreimiento). Se ha sometido a una ciruga que [employer] del vientre. Cules son los signos o sntomas? El principal sntoma de esta afeccin es un bulto en el ombligo o cerca de [place]. El bulto no causa dolor. Otros sntomas dependen del tipo de hernia que tenga. Una hernia reducible puede verse solo al artist, toser o lexicographer algo pesado. Otros sntomas pueden incluir: Dolor sordo. Sensacin de opresin. Una hernia encarcelada puede causar un dolor muy intenso. Adems, usted puede: Vomitar o tener nuseas. No poder eliminar gases. Una hernia estrangulada puede causar lo siguiente: Dolor que empeora cada vez ms. Vmitos o ganas de vomitar. Dolor al ejercer presin sobre la hernia. Cambio de color en la piel sobre la hernia. La piel puede ponerse roja o morada. Problemas para defecar. Sangre en las heces. Cmo se diagnostica? Esta afeccin se puede diagnosticar en funcin de lo siguiente: Los sntomas y antecedentes mdicos. Un examen fsico. Pueden pedirle que tosa o que haga fuerza mientras est de pie. Al hacer eso, ejercer presin dentro del vientre. La presin puede empujar la hernia a travs de la [employer]. El mdico puede intentar empujar la hernia nuevamente hacia adentro del vientre (reducirla). Cmo se trata? La ciruga es el nico tratamiento para una hernia umbilical. La ciruga para una hernia estrangulada debe realizarse de inmediato. Si tiene una hernia pequea que no est encarcelada, tal vez tenga que bajar de peso antes de que se realice  la ciruga. Siga estas indicaciones en su casa: Control del estreimiento Es posible que tenga que tomar estas medidas para prevenir los problemas para defecar. Esto ayudar a evitar que haga fuerza. Beber suficiente lquido para radio producer  pis (la orina) de color amarillo plido. Usar medicamentos recetados o de sales promotion account executive. Consumir alimentos ricos en fibra, como frijoles, cereales integrales, y frutas y verduras frescas. Limitar el consumo de alimentos ricos en grasa y azcares, como los alimentos fritos o dulces. Indicaciones generales No trate de reintroducir la hernia. Baje de peso si se lo indic el mdico. Observe si la hernia cambia  de color o de tamao. Infrmele al mdico si se producen cambios. Tal vez deba evitar las actividades que ejercen presin sobre la hernia. Es posible que personnel officer objetos. Pregntele al mdico cunto peso puede levantar sin correr [employer]. Use los medicamentos de venta libre y los recetados solamente como se lo haya indicado el mdico. Comunquese con un mdico si: La hernia se agranda o se endurece. La hernia le causa dolor. Tiene fiebre o escalofros. Solicite ayuda de inmediato si: Siente un dolor muy intenso cerca de la zona de la hernia, y el dolor aparece repentinamente. Tiene dolor y vomita o siente ganas de biochemist, clinical. La piel que recubre la hernia cambia  de color. Estos sntomas pueden customer service manager. Solicite ayuda de inmediato. Llame al 911. No espere hasta que los sntomas desaparezcan. No conduzca por sus propios medios officemax incorporated. Esta informacin no tiene theme park manager el consejo del mdico. Asegrese de hacerle al mdico cualquier pregunta que tenga. Document Revised: 05/20/2022 Document Reviewed: 05/20/2022 Elsevier Patient Education  2024 [employer].

## 2023-01-31 ENCOUNTER — Ambulatory Visit: Payer: Self-pay | Admitting: Physician Assistant

## 2023-01-31 ENCOUNTER — Encounter: Payer: Self-pay | Admitting: Physician Assistant

## 2023-01-31 ENCOUNTER — Ambulatory Visit: Payer: Self-pay

## 2023-01-31 VITALS — BP 100/65 | HR 84 | Temp 98.5°F | Ht 65.0 in | Wt 189.0 lb

## 2023-01-31 DIAGNOSIS — J3489 Other specified disorders of nose and nasal sinuses: Secondary | ICD-10-CM

## 2023-01-31 NOTE — Progress Notes (Signed)
 Established Patient Office Visit  Subjective   Patient ID: Gabriel Ball, male    DOB: 27-Jul-1970  Age: 53 y.o. MRN: [id number]  Chief Complaint  Patient presents with   Nasal Congestion    Nasal dryness    Discussed the use of AI scribe software for clinical note transcription with the patient, who gave verbal consent to proceed.  History of Present Illness        The patient, with a history of allergies, presents with a three-day history of difficulty breathing through one nostril, which he describes as feeling dry and irritated. He also reports the onset of a headache yesterday, which has resolved. The patient denies any recent exposure to sick individuals. He has been taking an allergy medication on a daily basis for the past month with relief of previous cough.  Otherwise he has not tried anything for relief.  Due to language barrier, an interpreter was present during the history-taking and subsequent discussion (and for part of the physical exam) with this patient.      Past Medical History:  Diagnosis Date   Anxiety    Asthma    GERD (gastroesophageal reflux disease)    Herpes    Shingles    Umbilical hernia    Social History   Socioeconomic History   Marital status: Significant Other    Spouse name: Not on file   Number of children: Not on file   Years of education: Not on file   Highest education level: Not on file  Occupational History   Not on file  Tobacco Use   Smoking status: Former    Passive exposure: Past   Smokeless tobacco: Never  Vaping Use   Vaping status: Never Used  Substance and Sexual Activity   Alcohol use: No   Drug use: No   Sexual activity: Yes  Other Topics Concern   Not on file  Social History Narrative   Single and lives with daughter and significant other.    Social Drivers of Corporate Investment Banker Strain: Not on file  Food Insecurity: Not on file  Transportation Needs: Not on file  Physical Activity: Not  on file  Stress: Not on file  Social Connections: Not on file  Intimate Partner Violence: Not on file   Family History  Problem Relation Age of Onset   Cancer Father    Allergies  Allergen Reactions   Cyclobenzaprine      Facial Numbness/dizziness/hypersomnolence   Ibuprofen  Hives   Methocarbamol      Abdominal pain   Amoxicillin  Itching    Pruritic rash   Penicillins Palpitations    Has patient had a PCN reaction causing immediate rash, facial/tongue/throat swelling, SOB or lightheadedness with hypotension: No Has patient had a PCN reaction causing severe rash involving mucus membranes or skin necrosis: No Has patient had a PCN reaction that required hospitalization: No Has patient had a PCN reaction occurring within the last 10 years: No If all of the above answers are NO, then may proceed with Cephalosporin use.    Review of Systems  Constitutional:  Negative for chills and fever.  HENT:  Negative for congestion, ear pain, sinus pain and sore throat.   Eyes: Negative.   Respiratory:  Negative for cough and shortness of breath.   Cardiovascular:  Negative for chest pain.  Gastrointestinal:  Negative for abdominal pain, nausea and vomiting.  Genitourinary: Negative.   Musculoskeletal: Negative.   Skin: Negative.   Neurological: Negative.  Endo/Heme/Allergies: Negative.   Psychiatric/Behavioral: Negative.        Objective:     BP 100/65 (BP Location: Left Arm, Patient Position: Sitting, Cuff Size: Large)   Pulse 84   Temp 98.5 F (36.9 C)   Ht 5' 5 (1.651 m)   Wt 189 lb (85.7 kg)   SpO2 97%   BMI 31.45 kg/m  BP Readings from Last 3 Encounters:  01/31/23 100/65  01/26/23 109/71  01/24/23 119/72   Wt Readings from Last 3 Encounters:  01/31/23 189 lb (85.7 kg)  01/26/23 189 lb (85.7 kg)  01/24/23 191 lb 12.8 oz (87 kg)    Physical Exam Vitals and nursing note reviewed.  Constitutional:      Appearance: Normal appearance.  HENT:     Head:  Normocephalic and atraumatic.     Salivary Glands: Right salivary gland is not diffusely enlarged or tender. Left salivary gland is not diffusely enlarged or tender.     Right Ear: External ear normal.     Left Ear: External ear normal.     Nose: Nose normal.     Right Nostril: No foreign body, epistaxis or occlusion.     Left Nostril: No foreign body, epistaxis or occlusion.     Right Turbinates: Enlarged and swollen.     Left Turbinates: Enlarged and swollen.     Right Sinus: No maxillary sinus tenderness or frontal sinus tenderness.     Left Sinus: No maxillary sinus tenderness or frontal sinus tenderness.     Mouth/Throat:     Lips: Pink.     Mouth: Mucous membranes are moist.  Cardiovascular:     Rate and Rhythm: Normal rate and regular rhythm.     Pulses: Normal pulses.     Heart sounds: Normal heart sounds.  Pulmonary:     Effort: Pulmonary effort is normal.     Breath sounds: Normal breath sounds.  Musculoskeletal:        General: Normal range of motion.  Skin:    General: Skin is warm and dry.  Neurological:     General: No focal deficit present.     Mental Status: He is alert and oriented to person, place, and time.  Psychiatric:        Mood and Affect: Mood normal.        Behavior: Behavior normal.        Thought Content: Thought content normal.        Judgment: Judgment normal.        Assessment & Plan:   Problem List Items Addressed This Visit   None Visit Diagnoses       Nasal dryness    -  Primary       1. Nasal dryness (Primary) Patient education given on supportive care.  Red flags given for prompt reevaluation.  Trial saline spray, purchase over-the-counter   I have reviewed the patient's medical history (PMH, PSH, Social History, Family History, Medications, and allergies) , and have been updated if relevant. I spent 20 minutes reviewing chart and  face to face time with patient.     Return if symptoms worsen or fail to improve.    Kirk RAMAN Mayers, PA-C

## 2023-01-31 NOTE — Patient Instructions (Addendum)
 To help with your nasal dryness, you are going to use saline spray.  You will purchase this over the counter.   Please let us  know if there is anything else we can do for you  Kirk CANDIE Sage, PA-C Physician Assistant [facility] [email or web address]

## 2023-01-31 NOTE — Telephone Encounter (Signed)
     Chief Complaint: Sinus pain, drainage, cough, sore throat. Symptoms: Above Frequency: Saturday Pertinent Negatives: Patient denies fever Disposition: [] ED /[] Urgent Care (no appt availability in office) / [x] Appointment(In office/virtual)/ []  [facility]/ [] Home Care/ [] Refused Recommended Disposition /[] [facility]/ []  Follow-up with PCP Additional Notes: Agrees with appointment.  Reason for Disposition  Lots of coughing  Answer Assessment - Initial Assessment Questions 1. LOCATION: Where does it hurt?      Face 2. ONSET: When did the sinus pain start?  (e.g., hours, days)      Saturday 3. SEVERITY: How bad is the pain?   (Scale 1-10; mild, moderate or severe)   - MILD (1-3): doesn't interfere with normal activities    - MODERATE (4-7): interferes with normal activities (e.g., work or school) or awakens from sleep   - SEVERE (8-10): excruciating pain and patient unable to do any normal activities        5 4. RECURRENT SYMPTOM: Have you ever had sinus problems before? If Yes, ask: When was the last time? and What happened that time?      yes 5. NASAL CONGESTION: Is the nose blocked? If Yes, ask: Can you open it or must you breathe through your mouth?     No 6. NASAL DISCHARGE: Do you have discharge from your nose? If so ask, What color?     Clear 7. FEVER: Do you have a fever? If Yes, ask: What is it, how was it measured, and when did it start?      No 8. OTHER SYMPTOMS: Do you have any other symptoms? (e.g., sore throat, cough, earache, difficulty breathing)     Sore throat, cough 9. PREGNANCY: Is there any chance you are pregnant? When was your last menstrual period?     N/a  Protocols used: Sinus Pain or Congestion-A-AH

## 2023-02-02 ENCOUNTER — Ambulatory Visit: Payer: Self-pay | Admitting: Physician Assistant

## 2023-02-08 ENCOUNTER — Other Ambulatory Visit: Payer: Self-pay

## 2023-02-08 MED ORDER — POLYETHYLENE GLYCOL 3350 17 GM/SCOOP PO POWD
17.0000 g | Freq: Two times a day (BID) | ORAL | 6 refills | Status: DC
  Filled 2023-02-08: qty 510, 15d supply, fill #0
  Filled 2023-03-08: qty 510, 15d supply, fill #1
  Filled 2023-04-11: qty 510, 15d supply, fill #2
  Filled 2023-05-26: qty 510, 15d supply, fill #3
  Filled 2023-08-12: qty 510, 15d supply, fill #4
  Filled 2023-10-27: qty 476, 14d supply, fill #5
  Filled 2023-10-27: qty 510, 15d supply, fill #5

## 2023-02-08 MED ORDER — LIDOCAINE (ANORECTAL) 5 % EX CREA
TOPICAL_CREAM | CUTANEOUS | 3 refills | Status: DC
  Filled 2023-02-08: qty 90, 30d supply, fill #0
  Filled 2023-02-11: qty 28.35, 20d supply, fill #0

## 2023-02-08 MED ORDER — VALACYCLOVIR HCL 1 G PO TABS
1000.0000 mg | ORAL_TABLET | Freq: Every day | ORAL | 3 refills | Status: DC
  Filled 2023-02-08: qty 90, 90d supply, fill #0

## 2023-02-11 ENCOUNTER — Other Ambulatory Visit: Payer: Self-pay

## 2023-02-14 ENCOUNTER — Other Ambulatory Visit: Payer: Self-pay

## 2023-02-15 ENCOUNTER — Other Ambulatory Visit: Payer: Self-pay

## 2023-03-08 ENCOUNTER — Other Ambulatory Visit: Payer: Self-pay

## 2023-03-09 ENCOUNTER — Ambulatory Visit: Payer: Self-pay | Admitting: Family Medicine

## 2023-03-16 ENCOUNTER — Ambulatory Visit: Payer: Self-pay | Admitting: Family Medicine

## 2023-03-28 ENCOUNTER — Encounter (HOSPITAL_COMMUNITY): Payer: Self-pay

## 2023-03-28 ENCOUNTER — Ambulatory Visit (HOSPITAL_COMMUNITY)
Admission: EM | Admit: 2023-03-28 | Discharge: 2023-03-28 | Disposition: A | Discharge status: Home / Self Care | Payer: Self-pay

## 2023-03-28 DIAGNOSIS — R0789 Other chest pain: Secondary | ICD-10-CM

## 2023-03-28 DIAGNOSIS — K047 Periapical abscess without sinus: Secondary | ICD-10-CM

## 2023-03-28 DIAGNOSIS — B009 Herpesviral infection, unspecified: Secondary | ICD-10-CM

## 2023-03-28 MED ORDER — VALACYCLOVIR HCL 1 G PO TABS: 1000.0000 mg | ORAL_TABLET | Freq: Every day | ORAL | 0 refills | Status: DC

## 2023-03-28 MED ORDER — TRIAMCINOLONE ACETONIDE 0.1 % MT PSTE: 1.0000 | PASTE | Freq: Two times a day (BID) | OROMUCOSAL | 0 refills | Status: DC

## 2023-03-28 MED ORDER — CEFUROXIME AXETIL 500 MG PO TABS
500.0000 mg | ORAL_TABLET | Freq: Two times a day (BID) | ORAL | 0 refills | Status: AC
Start: 2023-03-28 — End: 2023-04-05
  Filled 2023-03-29: qty 14, 7d supply, fill #0

## 2023-03-28 NOTE — ED Provider Notes (Signed)
 [facility]  Note:  This document was prepared using Dragon voice recognition software and may include unintentional dictation errors.  MRN: [id number] DOB: [birth year]  Subjective:   Gabriel Ball is a 53 y.o. male presenting for chest wall muscle spasm/discomfort, dental pain, oral HSV lesion x 2 days.  Patient denies significant chest pain or shortness of breath states that his chest wall just feels funny every once in a while.  Patient reports that he had dental visit where they cleaned his teeth but since then he has had redness and swelling to the gums as concern for dental infection.  Patient also reports that he has history of HSV and believes that he has a lesion on his tongue and would like treatment.  No current facility-administered medications for this encounter.  Current Outpatient Medications:    cefUROXime (CEFTIN) 500 MG tablet, Take 1 tablet (500 mg total) by mouth 2 (two) times daily with a meal for 7 days., Disp: 14 tablet, Rfl: 0   triamcinolone (KENALOG) 0.1 % paste, Use as directed 1 Application in the mouth or throat 2 (two) times daily., Disp: 5 g, Rfl: 0   fexofenadine (ALLEGRA) 180 MG tablet, Take 1 tablet (180 mg total) by mouth daily., Disp: 30 tablet, Rfl: 11   fluticasone (FLONASE) 50 MCG/ACT nasal spray, Place 2 sprays into both nostrils daily. 2 pulverizaciones en ambas fosas nasales al da (Patient not taking: Reported on 01/31/2023), Disp: 16 g, Rfl: 11   hydrocortisone cream 1 %, Apply to affected area 2 times daily (Patient not taking: Reported on 01/31/2023), Disp: 15 g, Rfl: 0   hydrOXYzine (ATARAX) 25 MG tablet, Take 25 mg by mouth 3 (three) times daily as needed for anxiety. (Patient not taking: Reported on 01/10/2023), Disp: , Rfl:    Lidocaine, Anorectal, (RECTOPROTECT) 5 % CREA, APPLY RECTALLY AS NEEDED, Disp: 85.05 g, Rfl: 3   Lidocaine, Anorectal, 5 % CREA, Apply rectally as needed (Patient not taking: Reported on 01/31/2023), Disp:  100 g, Rfl: 3   polyethylene glycol powder (GLYCOLAX/MIRALAX) 17 GM/SCOOP powder, Mix 17 grams in 4-8 ounces of water/juice and take by mouth 2 times daily as needed (Patient not taking: Reported on 01/31/2023), Disp: 510 g, Rfl: 6   polyethylene glycol powder (GLYCOLAX/MIRALAX) 17 GM/SCOOP powder, MIX 17 GRAMS IN 4 TO 8 OUNCES OF WATER/JUICE AND TAKE BY MOUTH TWO TIMES DAILY AS NEEDED., Disp: 510 g, Rfl: 6   valACYclovir (VALTREX) 1000 MG tablet, Take 1 tablet (1,000 mg total) by mouth daily. (Patient not taking: Reported on 01/31/2023), Disp: 90 tablet, Rfl: 3   valACYclovir (VALTREX) 1000 MG tablet, Take 1 tablet (1,000 mg total) by mouth daily., Disp: 3 tablet, Rfl: 0   Allergies  Allergen Reactions   Cyclobenzaprine     Facial Numbness/dizziness/hypersomnolence   Ibuprofen Hives   Methocarbamol     Abdominal pain   Amoxicillin Itching    Pruritic rash   Penicillins Palpitations    Has patient had a PCN reaction causing immediate rash, facial/tongue/throat swelling, SOB or lightheadedness with hypotension: No Has patient had a PCN reaction causing severe rash involving mucus membranes or skin necrosis: No Has patient had a PCN reaction that required hospitalization: No Has patient had a PCN reaction occurring within the last 10 years: No If all of the above answers are "NO", then may proceed with Cephalosporin use.    Past Medical History:  Diagnosis Date   Anxiety    Asthma    GERD (gastroesophageal  reflux disease)    Herpes    Shingles    Umbilical hernia      Past Surgical History:  Procedure Laterality Date   MOUTH SURGERY      Family History  Problem Relation Age of Onset   Cancer Father     Social History   Tobacco Use   Smoking status: Former    Passive exposure: Past   Smokeless tobacco: Never  Vaping Use   Vaping status: Never Used  Substance Use Topics   Alcohol use: No   Drug use: No    ROS Refer to HPI for ROS details.  Objective:    Vitals: BP 106/69 (BP Location: Right Arm)   Pulse 73   Temp 99 F (37.2 C) (Oral)   Resp 18   SpO2 96%   Physical Exam Vitals and nursing note reviewed.  Constitutional:      General: He is not in acute distress.    Appearance: Normal appearance. He is well-developed and normal weight. He is not ill-appearing or toxic-appearing.  HENT:     Head: Normocephalic.     Mouth/Throat:     Mouth: Mucous membranes are moist.     Dentition: Abnormal dentition. Dental tenderness and gingival swelling present. No dental abscesses or gum lesions.     Tongue: Lesions present.     Pharynx: Oropharynx is clear.  Cardiovascular:     Rate and Rhythm: Normal rate and regular rhythm.     Heart sounds: No murmur heard. Pulmonary:     Effort: Pulmonary effort is normal. No respiratory distress.     Breath sounds: Normal breath sounds.  Musculoskeletal:     Cervical back: Neck supple.  Skin:    General: Skin is warm and dry.     Capillary Refill: Capillary refill takes less than 2 seconds.  Neurological:     General: No focal deficit present.     Mental Status: He is alert and oriented to person, place, and time.  Psychiatric:        Mood and Affect: Mood normal.     Procedures  No results found for this or any previous visit (from the past 24 hours).  Assessment and Plan :   PDMP not reviewed this encounter.  1. HSV infection   2. Dental infection   3. Chest wall discomfort      HSV infection -Use prescribed valacyclovir 1000 mg once daily for 3 days for acute HSV infection flare. -Use prescribed triamcinolone 0.1% dental paste over oral lesion twice daily for pain and inflammation -Continue to monitor symptoms for worsening severity if you experience any escalation of symptoms follow-up for further evaluation  Dental infection -Take prescribed cefuroxime 500 mg twice daily for 7 days for acute dental infection -Follow-up with dentist as planned for further management of  ongoing dental infection.  Chest wall discomfort -EKG performed in UC is normal sinus rhythm with sinus arrhythmia ventricular rate of 79 bpm. -Based on described symptoms minimal concern for ACS. -If further concern or escalation of symptoms follow-up in the ER for further evaluation management  Mirna Mires B, NP 03/28/23 1843

## 2023-03-28 NOTE — ED Triage Notes (Signed)
 Per interpreter, pt states had oral sex with his wife on Friday and now his lips and mouth feels weird. States has a bump on his tongue.  C/o lt sided chest pain since yesterday. Denies any other sx's. States he feels like it's his nerves. Denies taking any meds.

## 2023-03-28 NOTE — Discharge Instructions (Addendum)
 HSV infection -Use prescribed valacyclovir 1000 mg once daily for 3 days for acute HSV infection flare. -Use prescribed triamcinolone 0.1% dental paste over oral lesion twice daily for pain and inflammation -Continue to monitor symptoms for worsening severity if you experience any escalation of symptoms follow-up for further evaluation  Dental infection -Take prescribed cefuroxime 500 mg twice daily for 7 days for acute dental infection -Follow-up with dentist as planned for further management of ongoing dental infection.  Chest wall discomfort -EKG performed in UC is normal sinus rhythm with sinus arrhythmia ventricular rate of 79 bpm. -Based on described symptoms minimal concern for ACS. -If further concern or escalation of symptoms follow-up in the ER for further evaluation management

## 2023-03-29 ENCOUNTER — Other Ambulatory Visit: Payer: Self-pay

## 2023-03-29 MED ORDER — CEFUROXIME AXETIL 250 MG PO TABS
250.0000 mg | ORAL_TABLET | Freq: Two times a day (BID) | ORAL | 0 refills | Status: DC
  Filled 2023-03-29: qty 14, 7d supply, fill #0

## 2023-03-29 MED ORDER — TRIAMCINOLONE ACETONIDE 0.1 % MT PSTE
PASTE | OROMUCOSAL | 0 refills | Status: DC
Start: 2023-03-28 — End: 2023-04-29
  Filled 2023-03-29 (×2): qty 5, 10d supply, fill #0

## 2023-03-29 MED ORDER — TRIAMCINOLONE ACETONIDE 0.1 % MT PSTE
PASTE | Freq: Two times a day (BID) | OROMUCOSAL | 0 refills | Status: DC
Start: 2023-03-28 — End: 2023-03-29
  Filled 2023-03-29: qty 5, 7d supply, fill #0

## 2023-03-30 ENCOUNTER — Other Ambulatory Visit: Payer: Self-pay

## 2023-03-30 ENCOUNTER — Ambulatory Visit: Payer: Self-pay | Admitting: Physician Assistant

## 2023-04-11 ENCOUNTER — Other Ambulatory Visit: Payer: Self-pay

## 2023-04-12 ENCOUNTER — Ambulatory Visit (INDEPENDENT_AMBULATORY_CARE_PROVIDER_SITE_OTHER): Payer: Self-pay | Admitting: Primary Care

## 2023-04-12 ENCOUNTER — Other Ambulatory Visit: Payer: Self-pay

## 2023-04-20 ENCOUNTER — Ambulatory Visit: Payer: Self-pay | Admitting: Physician Assistant

## 2023-04-29 ENCOUNTER — Encounter (HOSPITAL_COMMUNITY): Payer: Self-pay

## 2023-04-29 ENCOUNTER — Other Ambulatory Visit: Payer: Self-pay

## 2023-04-29 ENCOUNTER — Ambulatory Visit (HOSPITAL_COMMUNITY)
Admission: EM | Admit: 2023-04-29 | Discharge: 2023-04-29 | Disposition: A | Discharge status: Home / Self Care | Payer: Self-pay | Attending: Family Medicine | Admitting: Family Medicine

## 2023-04-29 DIAGNOSIS — L239 Allergic contact dermatitis, unspecified cause: Secondary | ICD-10-CM

## 2023-04-29 DIAGNOSIS — J3489 Other specified disorders of nose and nasal sinuses: Secondary | ICD-10-CM

## 2023-04-29 MED ORDER — TRIAMCINOLONE ACETONIDE 0.1 % EX CREA
1.0000 | TOPICAL_CREAM | Freq: Two times a day (BID) | CUTANEOUS | 0 refills | Status: DC
  Filled 2023-04-29: qty 80, 40d supply, fill #0

## 2023-04-29 NOTE — Discharge Instructions (Signed)
 Triamcinolone cream--apply 2 times daily to the rash area until better, about 10 to 14 days.  Betha Loa de triamcinolona: aplicar 2 veces al da en la zona afectada hasta que mejore (aproximadamente de 10 a 14 das).)   Use a humidifier in your bedroom at night to help with your dry nasal passages. (Use un humidificador en su habitacin por la noche para aliviar la sequedad nasal.)

## 2023-04-29 NOTE — ED Triage Notes (Signed)
 Patient presenting with rash on the right arm onset 1 week ago.  Rash is itching not painful no history of skin problems.  Prescriptions or OTC medications tried: No

## 2023-04-29 NOTE — ED Provider Notes (Signed)
 [facility]    CSN: [id number] Arrival date & time: 04/29/23  4098      History   Chief Complaint Chief Complaint  Patient presents with   Rash    [name] is a 53 y.o. male.    Rash Here for a pruritic pruritic rash on his right medial upper arm.  Is not painful.  He has a similar lesion beginning on his left forearm.  No trouble breathing or shortness of breath.  No fever  Then he wants me to look inside his nose.  He cannot tell me how long it has been dry but that is his main question, and he states that he cannot help but pick at his nose at night  No cough or congestion  Past Medical History:  Diagnosis Date   Anxiety    Asthma    GERD (gastroesophageal reflux disease)    Herpes    Shingles    Umbilical hernia     Patient Active Problem List   Diagnosis Date Noted   Acute bilateral low back pain without sciatica 06/05/2020   Acute pain of right shoulder 06/05/2020   Asthma due to environmental allergies 12/20/2016   HSV (herpes simplex virus) infection 02/24/2015   Anxiety 04/02/2014    Past Surgical History:  Procedure Laterality Date   MOUTH SURGERY         Home Medications    Prior to Admission medications   Medication Sig Start Date End Date Taking? Authorizing Provider  triamcinolone cream (KENALOG) 0.1 % Apply 1 Application topically 2 (two) times daily. To affected area on arms till better 04/29/23  Yes Blayden Conwell, Janace Aris, MD  fexofenadine (ALLEGRA) 180 MG tablet Take 1 tablet (180 mg total) by mouth daily. 01/10/23   Martina Sinner, MD  fluticasone (FLONASE) 50 MCG/ACT nasal spray Place 2 sprays into both nostrils daily. 2 pulverizaciones en ambas fosas nasales al da Patient not taking: Reported on 01/31/2023 01/10/23   Martina Sinner, MD  hydrocortisone cream 1 % Apply to affected area 2 times daily Patient not taking: Reported on 01/31/2023 10/11/22   Wynonia Lawman A, NP  hydrOXYzine (ATARAX) 25 MG  tablet Take 25 mg by mouth 3 (three) times daily as needed for anxiety. Patient not taking: Reported on 01/10/2023    [provider]  Lidocaine, Anorectal, (RECTOPROTECT) 5 % CREA APPLY RECTALLY AS NEEDED 01/17/23     Lidocaine, Anorectal, 5 % CREA Apply rectally as needed Patient not taking: Reported on 01/31/2023 01/17/23   Claiborne Rigg, NP  polyethylene glycol powder (GLYCOLAX/MIRALAX) 17 GM/SCOOP powder Mix 17 grams in 4-8 ounces of water/juice and take by mouth 2 times daily as needed Patient not taking: Reported on 01/31/2023 01/17/23   Claiborne Rigg, NP  polyethylene glycol powder (GLYCOLAX/MIRALAX) 17 GM/SCOOP powder MIX 17 GRAMS IN 4 TO 8 OUNCES OF WATER/JUICE AND TAKE BY MOUTH TWO TIMES DAILY AS NEEDED. 01/17/23     valACYclovir (VALTREX) 1000 MG tablet Take 1 tablet (1,000 mg total) by mouth daily. Patient not taking: Reported on 01/31/2023 01/17/23   Claiborne Rigg, NP  valACYclovir (VALTREX) 1000 MG tablet Take 1 tablet (1,000 mg total) by mouth daily. 03/28/23   Reddick, Nicola Girt B, NP  SUMAtriptan (IMITREX) 50 MG tablet Take one for headache.  May repeat in 2 hours.  No more than 2 a day 05/27/17 12/09/21  Eustace Moore, MD    Family History Family History  Problem Relation  Age of Onset   Cancer Father     Social History Social History   Tobacco Use   Smoking status: Former    Passive exposure: Past   Smokeless tobacco: Never  Vaping Use   Vaping status: Never Used  Substance Use Topics   Alcohol use: No   Drug use: No     Allergies   Cyclobenzaprine, Ibuprofen, Methocarbamol, Amoxicillin, and Penicillins   Review of Systems Review of Systems  Skin:  Positive for rash.     Physical Exam Triage Vital Signs ED Triage Vitals  Encounter Vitals Group     BP 04/29/23 0840 108/70     Systolic BP Percentile --      Diastolic BP Percentile --      Pulse Rate 04/29/23 0840 67     Resp 04/29/23 0840 16     Temp 04/29/23 0840 98.1 F (36.7  C)     Temp Source 04/29/23 0840 Oral     SpO2 04/29/23 0840 96 %     Weight --      Height --      Head Circumference --      Peak Flow --      Pain Score 04/29/23 0839 0     Pain Loc --      Pain Education --      Exclude from Growth Chart --    No data found.  Updated Vital Signs BP 108/70 (BP Location: Right Arm)   Pulse 67   Temp 98.1 F (36.7 C) (Oral)   Resp 16   SpO2 96%   Visual Acuity Right Eye Distance:   Left Eye Distance:   Bilateral Distance:    Right Eye Near:   Left Eye Near:    Bilateral Near:     Physical Exam Vitals reviewed.  Constitutional:      General: He is not in acute distress.    Appearance: He is not ill-appearing, toxic-appearing or diaphoretic.  HENT:     Nose:     Comments: On brief exam with otoscope light I cannot see any abnormality of the nostrils bilaterally. Cardiovascular:     Rate and Rhythm: Normal rate and regular rhythm.     Heart sounds: No murmur heard. Pulmonary:     Effort: Pulmonary effort is normal.     Breath sounds: Normal breath sounds.  Skin:    Coloration: Skin is not pale.     Comments: There is a maculopapular fine erythematous rash on the medial surface of the right forearm and does not extend past about 3 cm in any direction.  There is a similar spot on his left proximal forearm.  No ulceration and no vesicular lesions.  No drainage  Neurological:     Mental Status: He is alert and oriented to person, place, and time.  Psychiatric:        Behavior: Behavior normal.      UC Treatments / Results  Labs (all labs ordered are listed, but only abnormal results are displayed) Labs Reviewed - No data to display  EKG   Radiology No results found.  Procedures Procedures (including critical care time)  Medications Ordered in UC Medications - No data to display  Initial Impression / Assessment and Plan / UC Course  I have reviewed the triage vital signs and the nursing notes.  Pertinent labs &  imaging results that were available during my care of the patient were reviewed by me and considered in my  medical decision making (see chart for details).    Visit is conducted in [place].  Triamcinolone cream is sent in to treat the rash.  I do not have any recommendations for the nasal symptoms, other than the possible use of a humidifier in his bedroom.   Final Clinical Impressions(s) / UC Diagnoses   Final diagnoses:  Allergic contact dermatitis, unspecified trigger  Dry nose     Discharge Instructions      Triamcinolone cream--apply 2 times daily to the rash area until better, about 10 to 14 days.  Betha Loa de triamcinolona: aplicar 2 veces al da en la zona afectada hasta que mejore (aproximadamente de 10 a 14 das).)   Use a humidifier in your bedroom at night to help with your dry nasal passages. (Use un humidificador en su habitacin por la noche para aliviar la sequedad nasal.)      ED Prescriptions     Medication Sig Dispense Auth. Provider   triamcinolone cream (KENALOG) 0.1 % Apply 1 Application topically 2 (two) times daily. To affected area on arms till better 80 g Marlinda Mike Janace Aris, MD      PDMP not reviewed this encounter.   Zenia Resides, MD 04/29/23 [phone]

## 2023-05-03 ENCOUNTER — Encounter (HOSPITAL_COMMUNITY): Payer: Self-pay

## 2023-05-03 ENCOUNTER — Ambulatory Visit: Payer: Self-pay | Admitting: Nurse Practitioner

## 2023-05-03 ENCOUNTER — Emergency Department (HOSPITAL_COMMUNITY)
Admission: EM | Admit: 2023-05-03 | Discharge: 2023-05-03 | Disposition: A | Discharge status: Home / Self Care | Payer: Self-pay | Attending: Emergency Medicine | Admitting: Emergency Medicine

## 2023-05-03 ENCOUNTER — Other Ambulatory Visit: Payer: Self-pay

## 2023-05-03 DIAGNOSIS — L309 Dermatitis, unspecified: Secondary | ICD-10-CM | POA: Insufficient documentation

## 2023-05-03 MED ORDER — CLOBETASOL PROP EMOLLIENT BASE 0.05 % EX CREA
2.0000 | TOPICAL_CREAM | Freq: Two times a day (BID) | CUTANEOUS | 0 refills | Status: DC
  Filled 2023-05-03: qty 30, 15d supply, fill #0

## 2023-05-03 MED ORDER — LORATADINE 10 MG PO TABS
10.0000 mg | ORAL_TABLET | Freq: Every day | ORAL | 0 refills | Status: DC
  Filled 2023-05-03: qty 30, 30d supply, fill #0

## 2023-05-03 MED ORDER — CLOBETASOL PROP EMOLLIENT BASE 0.05 % EX CREA: 1.0000 g | TOPICAL_CREAM | Freq: Two times a day (BID) | CUTANEOUS | 0 refills | Status: DC

## 2023-05-03 NOTE — Discharge Instructions (Addendum)
 Lo atendieron hoy por una erupcin cutnea. Esta erupcin no parece ser herpes zster, dada su presencia en ambos brazos. Es ms probable que se trate de una irritacin por contacto o posiblemente de herpes, aunque una erupcin en el brazo no es tpica del herpes. Le he recetado una crema con esteroides ms fuerte y un antihistamnico para que la tome durante las prximas dos semanas para [employer] sntomas. Si los sntomas empeoran, regrese a Oceanographer.  You were seen today for concerns of a rash. This rash does not appear to be shingles given presence on both arms. This is more likely to be a contact irritation or possibly herpes, although a rash on the arm is not typical of herpes. I have sent a new prescription for a stronger steroid cream and antihistamine to take for the next 2 weeks to help with symptoms. Return to the ER for worsening symptoms.

## 2023-05-03 NOTE — ED Provider Notes (Signed)
 [place] [facility] AT [facility] Provider Note   CSN: [id number] Arrival date & time: 05/03/23  7253     History Chief Complaint  Patient presents with   Rash    Gabriel Ball is a 53 y.o. male.  Patient presents to the emergency department today with concerns of a rash on bilateral arms.  He reports has been ongoing for about 2 weeks.  He is concerned that this may potentially be herpes related.  Denies any rash or lesions to the buccal mucosa, oropharynx, or the pelvis.  Denies any contact with any specific items that he has known allergies to.   Rash      Home Medications Prior to Admission medications   Medication Sig Start Date End Date Taking? Authorizing Provider  Clobetasol Prop Emollient Base 0.05 % emollient cream Apply 2 Applications topically 2 (two) times daily. 05/03/23  Yes Smitty Knudsen, PA-C  loratadine (CLARITIN) 10 MG tablet Take 1 tablet (10 mg total) by mouth daily. 05/03/23 06/02/23 Yes Smitty Knudsen, PA-C  Clobetasol Prop Emollient Base 0.05 % emollient cream Apply topically 2 (two) times daily. 05/02/23   Smitty Knudsen, PA-C  fexofenadine (ALLEGRA) 180 MG tablet Take 1 tablet (180 mg total) by mouth daily. 01/10/23   Martina Sinner, MD  fluticasone (FLONASE) 50 MCG/ACT nasal spray Place 2 sprays into both nostrils daily. 2 pulverizaciones en ambas fosas nasales al da Patient not taking: Reported on 01/31/2023 01/10/23   Martina Sinner, MD  hydrocortisone cream 1 % Apply to affected area 2 times daily Patient not taking: Reported on 01/31/2023 10/11/22   Wynonia Lawman A, NP  hydrOXYzine (ATARAX) 25 MG tablet Take 25 mg by mouth 3 (three) times daily as needed for anxiety. Patient not taking: Reported on 01/10/2023    [provider]  Lidocaine, Anorectal, (RECTOPROTECT) 5 % CREA APPLY RECTALLY AS NEEDED 01/17/23     Lidocaine, Anorectal, 5 % CREA Apply rectally as needed Patient not taking: Reported on  01/31/2023 01/17/23   Claiborne Rigg, NP  polyethylene glycol powder (GLYCOLAX/MIRALAX) 17 GM/SCOOP powder Mix 17 grams in 4-8 ounces of water/juice and take by mouth 2 times daily as needed Patient not taking: Reported on 01/31/2023 01/17/23   Claiborne Rigg, NP  polyethylene glycol powder (GLYCOLAX/MIRALAX) 17 GM/SCOOP powder MIX 17 GRAMS IN 4 TO 8 OUNCES OF WATER/JUICE AND TAKE BY MOUTH TWO TIMES DAILY AS NEEDED. 01/17/23     triamcinolone cream (KENALOG) 0.1 % Apply 1 Application topically 2 (two) times daily to affected area on arms until better. 04/29/23   Zenia Resides, MD  valACYclovir (VALTREX) 1000 MG tablet Take 1 tablet (1,000 mg total) by mouth daily. Patient not taking: Reported on 01/31/2023 01/17/23   Claiborne Rigg, NP  valACYclovir (VALTREX) 1000 MG tablet Take 1 tablet (1,000 mg total) by mouth daily. 03/28/23   Reddick, Nicola Girt B, NP  SUMAtriptan (IMITREX) 50 MG tablet Take one for headache.  May repeat in 2 hours.  No more than 2 a day 05/27/17 12/09/21  Eustace Moore, MD      Allergies    Cyclobenzaprine, Ibuprofen, Methocarbamol, Amoxicillin, and Penicillins    Review of Systems   Review of Systems  Skin:  Positive for rash.  All other systems reviewed and are negative.   Physical Exam Updated Vital Signs BP 119/79 (BP Location: Left Arm)   Pulse 84   Temp 99.1 F (37.3 C) (Oral)  Resp 18   Ht 5\' 5"  (1.651 m)   Wt 85.7 kg   SpO2 98%   BMI 31.44 kg/m  Physical Exam Vitals and nursing note reviewed.  Constitutional:      General: He is not in acute distress.    Appearance: He is well-developed.  HENT:     Head: Normocephalic and atraumatic.  Eyes:     Conjunctiva/sclera: Conjunctivae normal.  Cardiovascular:     Rate and Rhythm: Normal rate and regular rhythm.     Heart sounds: No murmur heard. Pulmonary:     Effort: Pulmonary effort is normal. No respiratory distress.     Breath sounds: Normal breath sounds.  Abdominal:      Palpations: Abdomen is soft.     Tenderness: There is no abdominal tenderness.  Musculoskeletal:        General: No swelling.     Cervical back: Neck supple.  Skin:    General: Skin is warm and dry.     Capillary Refill: Capillary refill takes less than 2 seconds.     Findings: Rash present.     Comments: Vesicular rash present at the right upper arm.  No vesicles are ruptured.  Minimal induration.  The lesion itself is linear in nature.  Left arm at the mid forearm has a smaller linear lesion present.  Vesicles also present.  Neurological:     Mental Status: He is alert.  Psychiatric:        Mood and Affect: Mood normal.        ED Results / Procedures / Treatments   Labs (all labs ordered are listed, but only abnormal results are displayed) Labs Reviewed - No data to display  EKG None  Radiology No results found.  Procedures Procedures   Medications Ordered in ED Medications - No data to display  ED Course/ Medical Decision Making/ A&P                                 Medical Decision Making Risk OTC drugs. Prescription drug management.   This patient presents to the ED for concern of rash.  differential diagnosis includes contact dermatitis, shingles, abscess   Problem List / ED Course:  Patient presents emergency department today with concerns of a rash.  Reports that rash is present on bilateral arms.  States concern for possible herpes although he has been using valacyclovir at home.  He denies any rash in the oropharynx or in the pelvis region.  Says the rash is itchy and sometimes painful.  Has been applying triamcinolone cream to the area without improvement in symptoms. Exam reveals linear lesions to the right upper arm and left forearm.  Vesicles are present on both of these. Findings consistent with contact dermatitis, likely recently similar to poison ivy or poison oak given prolonged course of symptoms.  Advised patient that this does not appear to  be consistent with herpes given area of distribution and also does not appear to be related to shingles as this is affecting bilateral sides.  Patient did advise that he is concerned for possible exposure due to the deteriorating recall about 2 or 3 weeks ago in which she was outdoors helping another individual with painting and noted that he had some itching in his arms after that. With these findings, will discharge patient home with a stronger steroid cream such as clobetasol to be applied twice daily to the affected  area.  Also discharged home with an antihistamine prescription for loratadine.  Advised patient to use these medications as prescribed.  Return precautions discussed.  Patient otherwise stable for outpatient follow-up and discharged home.  Final Clinical Impression(s) / ED Diagnoses Final diagnoses:  Dermatitis    Rx / DC Orders ED Discharge Orders          Ordered    Clobetasol Prop Emollient Base 0.05 % emollient cream  2 times daily        05/03/23 1033    loratadine (CLARITIN) 10 MG tablet  Daily        05/03/23 1033              Shivaan Tierno A, PA-C 05/03/23 1433    Afton Horse T, DO 05/05/23 [phone]

## 2023-05-03 NOTE — ED Triage Notes (Signed)
 Pt has a rash to both arms. Pt reports having herpes.

## 2023-05-24 ENCOUNTER — Ambulatory Visit: Payer: Self-pay | Admitting: Nurse Practitioner

## 2023-05-26 ENCOUNTER — Other Ambulatory Visit: Payer: Self-pay | Admitting: Physician Assistant

## 2023-05-26 ENCOUNTER — Other Ambulatory Visit: Payer: Self-pay

## 2023-05-26 ENCOUNTER — Telehealth: Payer: Self-pay

## 2023-05-26 DIAGNOSIS — K649 Unspecified hemorrhoids: Secondary | ICD-10-CM

## 2023-05-26 DIAGNOSIS — F419 Anxiety disorder, unspecified: Secondary | ICD-10-CM

## 2023-05-26 MED ORDER — HYDROXYZINE PAMOATE 25 MG PO CAPS
25.0000 mg | ORAL_CAPSULE | Freq: Three times a day (TID) | ORAL | 0 refills | Status: DC | PRN
  Filled 2023-05-26: qty 60, 20d supply, fill #0

## 2023-05-26 MED ORDER — LIDOCAINE (ANORECTAL) 5 % EX CREA
TOPICAL_CREAM | CUTANEOUS | 3 refills | Status: AC
  Filled 2023-05-26: qty 85.05, 30d supply, fill #0

## 2023-05-26 NOTE — Telephone Encounter (Signed)
 Patient is requesting a refill on hydrOXYzine  (ATARAX ) 25 MG tablet  and a prescription for hemorrhoids.

## 2023-05-26 NOTE — Telephone Encounter (Signed)
 Copied from CRM [id number]. Topic: Clinical - Medication Refill >> May 26, 2023 10:42 AM Rosaria Common wrote: Medication: hydrOXYzine  (ATARAX ) 25 MG tablet, Lidocaine , Anorectal, (RECTOPROTECT) 5 % CREA  Has the patient contacted their pharmacy? Yes (Agent: If no, request that the patient contact the pharmacy for the refill. If patient does not wish to contact the pharmacy document the reason why and proceed with request.) (Agent: If yes, when and what did the pharmacy advise?)  This is the patient's preferred pharmacy:  [facility] [address] [place] [address] Phone: [phone] Fax: [phone]  Is this the correct pharmacy for this prescription? Yes If no, delete pharmacy and type the correct one.   Has the prescription been filled recently? Yes  Is the patient out of the medication? No  Has the patient been seen for an appointment in the last year OR does the patient have an upcoming appointment? Yes  Can we respond through MyChart? No  Agent: Please be advised that Rx refills may take up to 3 business days. We ask that you follow-up with your pharmacy.

## 2023-05-26 NOTE — Telephone Encounter (Signed)
 Medication sent.

## 2023-05-27 ENCOUNTER — Other Ambulatory Visit: Payer: Self-pay

## 2023-05-30 ENCOUNTER — Other Ambulatory Visit: Payer: Self-pay

## 2023-06-02 ENCOUNTER — Other Ambulatory Visit: Payer: Self-pay

## 2023-08-12 ENCOUNTER — Other Ambulatory Visit: Payer: Self-pay

## 2023-08-15 ENCOUNTER — Other Ambulatory Visit: Payer: Self-pay

## 2023-08-16 ENCOUNTER — Other Ambulatory Visit: Payer: Self-pay

## 2023-08-22 ENCOUNTER — Telehealth: Payer: Self-pay | Admitting: Nurse Practitioner

## 2023-08-22 ENCOUNTER — Other Ambulatory Visit: Payer: Self-pay

## 2023-08-22 NOTE — Telephone Encounter (Signed)
 Pt requesting refill on medication not listed on med list: symbicort    Copied from CRM [id number]. Topic: Clinical - Medication Refill >> Aug 22, 2023  9:47 AM Berwyn MATSU wrote: Medication: budesonide -formoterol  (SYMBICORT ) 160-4.5 MCG/ACT inhaler   Has the patient contacted their pharmacy? Yes (Agent: If no, request that the patient contact the pharmacy for the refill. If patient does not wish to contact the pharmacy document the reason why and proceed with request.) (Agent: If yes, when and what did the pharmacy advise?)   This is the patient's preferred pharmacy:  [facility] [address] [place] [address] Phone: [phone] Fax: [phone]   Is this the correct pharmacy for this prescription? Yes If no, delete pharmacy and type the correct one.    Has the prescription been filled recently? No   Is the patient out of the medication? Yes   Has the patient been seen for an appointment in the last year OR does the patient have an upcoming appointment? Yes   Can we respond through MyChart? Yes   Agent: Please be advised that Rx refills may take up to 3 business days. We ask that you follow-up with your pharmacy.

## 2023-08-22 NOTE — Telephone Encounter (Unsigned)
 Copied from CRM [id number]. Topic: Clinical - Medication Refill >> Aug 22, 2023  9:47 AM Berwyn MATSU wrote: Medication: budesonide -formoterol  (SYMBICORT ) 160-4.5 MCG/ACT inhaler  Has the patient contacted their pharmacy? Yes (Agent: If no, request that the patient contact the pharmacy for the refill. If patient does not wish to contact the pharmacy document the reason why and proceed with request.) (Agent: If yes, when and what did the pharmacy advise?)  This is the patient's preferred pharmacy:  [facility] [address] [place] [address] Phone: [phone] Fax: [phone]  Is this the correct pharmacy for this prescription? Yes If no, delete pharmacy and type the correct one.   Has the prescription been filled recently? No  Is the patient out of the medication? Yes  Has the patient been seen for an appointment in the last year OR does the patient have an upcoming appointment? Yes  Can we respond through MyChart? Yes  Agent: Please be advised that Rx refills may take up to 3 business days. We ask that you follow-up with your pharmacy.

## 2023-09-11 ENCOUNTER — Encounter (HOSPITAL_COMMUNITY): Payer: Self-pay

## 2023-09-11 ENCOUNTER — Ambulatory Visit (HOSPITAL_COMMUNITY)
Admission: EM | Admit: 2023-09-11 | Discharge: 2023-09-11 | Disposition: A | Discharge status: Home / Self Care | Payer: Self-pay | Attending: Emergency Medicine | Admitting: Emergency Medicine

## 2023-09-11 DIAGNOSIS — J309 Allergic rhinitis, unspecified: Secondary | ICD-10-CM

## 2023-09-11 DIAGNOSIS — H1013 Acute atopic conjunctivitis, bilateral: Secondary | ICD-10-CM

## 2023-09-11 DIAGNOSIS — G4700 Insomnia, unspecified: Secondary | ICD-10-CM

## 2023-09-11 DIAGNOSIS — F419 Anxiety disorder, unspecified: Secondary | ICD-10-CM

## 2023-09-11 MED ORDER — HYDROXYZINE PAMOATE 25 MG PO CAPS
25.0000 mg | ORAL_CAPSULE | Freq: Every evening | ORAL | 0 refills | Status: AC | PRN
  Filled 2023-09-12: qty 60, 60d supply, fill #0

## 2023-09-11 MED ORDER — CETIRIZINE HCL 10 MG PO TABS
10.0000 mg | ORAL_TABLET | Freq: Every day | ORAL | 0 refills | Status: DC
  Filled 2023-09-12: qty 90, 90d supply, fill #0

## 2023-09-11 MED ORDER — TRIAMCINOLONE ACETONIDE 40 MG/ML IJ SUSP
80.0000 mg | Freq: Once | INTRAMUSCULAR | Status: AC
  Administered 2023-09-11: 80 mg via INTRAMUSCULAR

## 2023-09-11 MED ORDER — OLOPATADINE HCL 0.2 % OP SOLN
1.0000 [drp] | Freq: Every day | OPHTHALMIC | 1 refills | Status: DC
  Filled 2023-09-12: qty 2.5, 50d supply, fill #0

## 2023-09-11 MED ORDER — FLUTICASONE PROPIONATE 50 MCG/ACT NA SUSP
1.0000 | Freq: Every day | NASAL | 2 refills | Status: DC
  Filled 2023-09-12: qty 16, 25d supply, fill #0

## 2023-09-11 MED ORDER — TRIAMCINOLONE ACETONIDE 40 MG/ML IJ SUSP
INTRAMUSCULAR | Status: AC
  Filled 2023-09-11: qty 2

## 2023-09-11 NOTE — ED Triage Notes (Signed)
 Patient presents to the office for eye irritation and pain. Patient states he woke up with crsut in both eyes.

## 2023-09-11 NOTE — ED Notes (Signed)
 Patient received paper scripts

## 2023-09-11 NOTE — ED Provider Notes (Signed)
 [facility]    CSN: [id number] Arrival date & time: 09/11/23  1002    HISTORY   Chief Complaint  Patient presents with   Eye Problem   [name] is a pleasant, 53 y.o. male who presents to urgent care today. Patient complains of eye redness, eye irritation, foreign body sensation and crusting in both eyes when he woke up this morning.  Patient has a history of dermatitis and seasonal versus allergies, previously prescribed topical steroids, Flonase  and Allegra  for the symptoms, patient states no longer taking Flonase  and the prescription for Allegra  has never been filled.  Patient denies rash at this time and is not currently using a topical steroid.  Patient denies vision changes, crusting of the eyes at this time after having cleaned them, eyelid swelling, frank eye pain.  Visual acuity is essentially normal today.    At end of visit with the patient, he mentions that he is having difficulty sleeping at night partly due to having a large number of people living with him in his apartment and also living in an apartment with very noisy neighbors.  Patient is asking for help with this.  The history is provided by the patient.  Eye Problem  Past Medical History:  Diagnosis Date   Anxiety    Asthma    GERD (gastroesophageal reflux disease)    Herpes    Shingles    Umbilical hernia    Patient Active Problem List   Diagnosis Date Noted   Acute bilateral low back pain without sciatica 06/05/2020   Acute pain of right shoulder 06/05/2020   Asthma due to environmental allergies 12/20/2016   HSV (herpes simplex virus) infection 02/24/2015   Anxiety 04/02/2014   Past Surgical History:  Procedure Laterality Date   MOUTH SURGERY      Home Medications    Prior to Admission medications   Medication Sig Start Date End Date Taking? Authorizing Provider  Clobetasol  Prop Emollient Base 0.05 % emollient cream Apply topically 2 (two) times daily. 05/02/23    Zelaya, Oscar A, PA-C  Clobetasol  Prop Emollient Base 0.05 % emollient cream Apply 2 Applications topically 2 (two) times daily. 05/03/23   Zelaya, Oscar A, PA-C  fexofenadine  (ALLEGRA ) 180 MG tablet Take 1 tablet (180 mg total) by mouth daily. 01/10/23   Kara Dorn NOVAK, MD  fluticasone  (FLONASE ) 50 MCG/ACT nasal spray Place 2 sprays into both nostrils daily. 2 pulverizaciones en ambas fosas nasales al da Patient not taking: Reported on 01/31/2023 01/10/23   Kara Dorn NOVAK, MD  hydrocortisone  cream 1 % Apply to affected area 2 times daily Patient not taking: Reported on 01/31/2023 10/11/22   Johnie Flaming A, NP  hydrOXYzine  (ATARAX ) 25 MG tablet Take 25 mg by mouth 3 (three) times daily as needed for anxiety. Patient not taking: Reported on 01/10/2023    [provider]  hydrOXYzine  (VISTARIL ) 25 MG capsule Take 1 capsule (25 mg total) by mouth every 8 (eight) hours as needed. For anxiety 05/26/23   Danton Jon HERO, PA-C  Lidocaine , Anorectal, 5 % CREA APPLY RECTALLY AS NEEDED 05/26/23   Danton Jon HERO, PA-C  Lidocaine , Anorectal, 5 % CREA Apply rectally as needed Patient not taking: Reported on 01/31/2023 01/17/23   Theotis Haze ORN, NP  loratadine  (CLARITIN ) 10 MG tablet Take 1 tablet (10 mg total) by mouth daily. 05/03/23 06/02/23  Zelaya, Oscar A, PA-C  polyethylene glycol powder (GLYCOLAX /MIRALAX ) 17 GM/SCOOP powder Mix 17 grams in 4-8 ounces  of water/juice and take by mouth 2 times daily as needed Patient not taking: Reported on 01/31/2023 01/17/23   Fleming, Zelda W, NP  polyethylene glycol powder (GLYCOLAX /MIRALAX ) 17 GM/SCOOP powder MIX 17 GRAMS IN 4 TO 8 OUNCES OF WATER/JUICE AND TAKE BY MOUTH TWO TIMES DAILY AS NEEDED. 01/17/23     triamcinolone  cream (KENALOG ) 0.1 % Apply 1 Application topically 2 (two) times daily to affected area on arms until better. 04/29/23   Vonna Sharlet POUR, MD  valACYclovir  (VALTREX ) 1000 MG tablet Take 1 tablet (1,000 mg total) by mouth  daily. Patient not taking: Reported on 01/31/2023 01/17/23   Fleming, Zelda W, NP  valACYclovir  (VALTREX ) 1000 MG tablet Take 1 tablet (1,000 mg total) by mouth daily. 03/28/23   Reddick, Johnathan B, NP  SUMAtriptan  (IMITREX ) 50 MG tablet Take one for headache.  May repeat in 2 hours.  No more than 2 a day 05/27/17 12/09/21  Maranda Jamee Jacob, MD    Family History Family History  Problem Relation Age of Onset   Cancer Father    Social History Social History   Tobacco Use   Smoking status: Former    Passive exposure: Past   Smokeless tobacco: Never  Vaping Use   Vaping status: Never Used  Substance Use Topics   Alcohol use: No   Drug use: No   Allergies   Amoxicillin , Cyclobenzaprine , Ibuprofen , Methocarbamol , and Penicillins  Review of Systems Review of Systems Pertinent findings revealed after performing a 14 point review of systems has been noted in the history of present illness.  Physical Exam Vital Signs BP 101/64 (BP Location: Left Arm)   Pulse 78   Temp 98.3 F (36.8 C) (Oral)   Resp 18   SpO2 96%   No data found.  Physical Exam Vitals and nursing note reviewed.  Constitutional:      General: He is awake. He is not in acute distress.    Appearance: Normal appearance. He is well-developed and well-groomed. He is not ill-appearing.  HENT:     Right Ear: External ear normal. A middle ear effusion is present. Tympanic membrane is bulging. Tympanic membrane is not injected or erythematous.     Left Ear: External ear normal. A middle ear effusion is present. Tympanic membrane is bulging. Tympanic membrane is not injected or erythematous.     Ears:     Comments: Bilateral EACs normal, both TMs bulging with clear fluid    Nose: Rhinorrhea present. No nasal deformity, septal deviation, signs of injury or nasal tenderness. Rhinorrhea is clear.     Right Nostril: Occlusion present. No foreign body, epistaxis or septal hematoma.     Left Nostril: Occlusion present. No  foreign body, epistaxis or septal hematoma.     Right Turbinates: Enlarged, swollen and pale.     Left Turbinates: Enlarged, swollen and pale.     Right Sinus: No maxillary sinus tenderness or frontal sinus tenderness.     Left Sinus: No maxillary sinus tenderness or frontal sinus tenderness.     Mouth/Throat:     Comments: Postnasal drip Eyes:     General: Lids are normal. Allergic shiner present.        Right eye: No foreign body, discharge or hordeolum.        Left eye: No foreign body, discharge or hordeolum.     Extraocular Movements: Extraocular movements intact.     Conjunctiva/sclera: Conjunctivae normal.  Neurological:     Mental Status: He is alert.  Psychiatric:  Behavior: Behavior is cooperative.     Visual Acuity Right Eye Distance: 20/50 Left Eye Distance: 20/50 Bilateral Distance: 20/50  Right Eye Near: R Near: 20/50 Left Eye Near:  L Near: 20/50 Bilateral Near:  20/50  UC Couse / Diagnostics / Procedures:     Radiology No results found.  Procedures Procedures (including critical care time) EKG  Pending results:  Labs Reviewed - No data to display  Medications Ordered in UC: Medications  triamcinolone  acetonide (KENALOG -40) injection 80 mg (has no administration in time range)    UC Diagnoses / Final Clinical Impressions(s)   I have reviewed the triage vital signs and the nursing notes.  Pertinent labs & imaging results that were available during my care of the patient were reviewed by me and considered in my medical decision making (see chart for details).    Final diagnoses:  Acute atopic conjunctivitis of both eyes  Allergic rhinitis, unspecified seasonality, unspecified trigger  Insomnia, unspecified type   Physical exam findings concerning for uncontrolled respiratory allergies.  No rash appreciated at this time.  Patient provided with an injection of Kenalog  during his visit today for rapid relief of his symptoms.  Patient states he  is uninsured at this time.  Patient requesting printed prescriptions so that he can shop around for best prices.  Patient encouraged to go to the community pharmacy tomorrow morning.  Patient provided with prescriptions for allergy medications including Zyrtec , Flonase , Pataday  eyedrops and hydroxyzine  for sleep.  Conservative care recommended.  Return precautions advised.  Please see discharge instructions below for details of plan of care as provided to patient. ED Prescriptions     Medication Sig Dispense Auth. Provider   Olopatadine  HCl (PATADAY ) 0.2 % SOLN Apply 1 drop to eye daily. 2.5 mL Joesph Shaver Scales, PA-C   cetirizine  (ZYRTEC  ALLERGY) 10 MG tablet Take 1 tablet (10 mg total) by mouth at bedtime. 90 tablet Joesph Shaver Scales, PA-C   fluticasone  (FLONASE ) 50 MCG/ACT nasal spray Place 1 spray into both nostrils daily. Begin by using 2 sprays in each nare daily for 3 to 5 days, then decrease to 1 spray in each nare daily. 15.8 mL Joesph Shaver Scales, PA-C   hydrOXYzine  (VISTARIL ) 25 MG capsule Take 1 capsule (25 mg total) by mouth at bedtime as needed (sleep). For anxiety 60 capsule Joesph Shaver Scales, PA-C      PDMP not reviewed this encounter.  Pending results:  Labs Reviewed - No data to display    Discharge Instructions      Sus sntomas y los hallazgos de mi examen fsico son preocupantes, ya que podran estar exacerbando sus alergias subyacentes.  Para evitar contraer infecciones respiratorias frecuentes, reacciones cutneas, irritacin ocular, prdida de sueo, ausencias al trabajo, etc., debido a alergias no controladas, es importante que comience y contine su tratamiento para la alergia y sea constante con la toma de sus medicamentos exactamente como se Corporate treasurer.  Lea a continuacin para obtener ms informacin [employer], las dosis y Print production planner de uso que recomiendo para Paramedic sus sntomas y que se sienta mejor pronto: Zyrtec   (cetirizina): Este es un excelente antihistamnico de segunda generacin que ayuda a reducir la respuesta inflamatoria respiratoria a los alrgenos [place]. En algunos pacientes, este medicamento puede causar somnolencia diurna, por lo que recomiendo tomar una tableta al da antes de acostarse.  Flonase  (fluticasona): Este es un aerosol nasal con esteroides que se usa  una vez al da, una aplicacin en cada fosa nasal.  Funciona mejor cuando se usa  a diario. Este medicamento no es eficaz si solo se usa  cuando se cree necesario. Despus de 3 a 5 das de [place], notar una reduccin significativa de la inflamacin y la produccin de moco causadas por la exposicin a alrgenos, ya sean estacionales o ambientales. El efecto secundario ms comn de este medicamento es la hemorragia nasal. Si experimenta una hemorragia nasal, suspenda su uso durante una semana y luego puede [place]. Si su seguro no cubre [employer], considere otros esteroides nasales como Nasonex (mometasona) o Nasacort  (triamcinolona).  Pataday  (olopatadina): Este es un colirio antihistamnico que se puede usar una vez al da para ayudar a Paramedic la sequedad, el picor y el enrojecimiento ocular. Este colirio antihistamnico no solo funciona para la conjuntivitis Counselling psychologist, sino que tambin es muy til para la conjuntivitis viral. No lo use ms de Medical sales representative; para un mejor alivio, selo por la [place].  Para ayudarle a dormir por la noche, le proporcion una receta para un medicamento llamado Atarax  (hidroxizina). Tome 1 comprimido segn sea necesario aproximadamente 1 hora antes de acostarse.  Si los sntomas no han mejorado significativamente en los prximos 5 a [address], regrese para una nueva evaluacin o una cita de seguimiento con su mdico habitual. Si los sntomas han [employer] prximos 3 a 5 das, regrese para una nueva evaluacin o una cita de seguimiento con su mdico habitual.  Gracias por visitar nuestra sala de  urgencias hoy. Agradecemos la oportunidad de participar en su atencin.   Your symptoms and my physical exam findings are concerning for exacerbation of your underlying allergies.     To avoid catching frequent respiratory infections, having skin reactions, dealing with eye irritation, losing sleep, missing work, etc., due to uncontrolled allergies, it is important that you begin/continue your allergy regimen and are consistent with taking your meds exactly as prescribed.   Please read below to learn more about the medications, dosages and frequencies that I recommend to help alleviate your symptoms and to get you feeling better soon: Zyrtec  (cetirizine ): This is an excellent second-generation antihistamine that helps to reduce respiratory inflammatory response to environmental allergens.  In some patients, this medication can cause daytime sleepiness so I recommend that you take 1 tablet daily at bedtime.     Flonase  (fluticasone ): This is a steroid nasal spray that used once daily, 1 spray in each nare.  This works best when used on a daily basis. This medication does not work well if it is only used when you think you need it.  After 3 to 5 days of use, you will notice significant reduction of the inflammation and mucus production that is currently being caused by exposure to allergens, whether seasonal or environmental.  The most common side effect of this medication is nosebleeds.  If you experience a nosebleed, please discontinue use for 1 week, then feel free to resume.  If you find that your insurance will not pay for this medication, please consider a different nasal steroids such as Nasonex (mometasone ), or Nasacort  (triamcinolone ).   Pataday  (olopatadine ): This is an antihistamine eyedrop that can be used once daily to help relieve dry eyes, itchy eyes and red eyes.  This antihistamine drop not only works for allergic conjunctivitis but is also very helpful with viral conjunctivitis.  Please  do not use this drop more than once a day, for best relief please use this in the morning.    To help you sleep at  night, I provided you with a prescription for a medication called Atarax  (hydroxyzine ).  Please take 1 tablet as needed approximately 1 hour before you plan to go to bed.   If symptoms have not meaningfully improved in the next 5 to 7 days, please return for repeat evaluation or follow-up with your regular provider.  If symptoms have worsened in the next 3 to 5 days, please return for repeat evaluation or follow-up with your regular provider.    Thank you for visiting urgent care today.  We appreciate the opportunity to participate in your care.       Disposition Upon Discharge:  Condition: stable for discharge home  Patient presented with an acute illness with associated systemic symptoms and significant discomfort requiring urgent management. In my opinion, this is a condition that a prudent lay person (someone who possesses an average knowledge of health and medicine) may potentially expect to result in complications if not addressed urgently such as respiratory distress, impairment of bodily function or dysfunction of bodily organs.   Routine symptom specific, illness specific and/or disease specific instructions were discussed with the patient and/or caregiver at length.   As such, the patient has been evaluated and assessed, work-up was performed and treatment was provided in alignment with urgent care protocols and evidence based medicine.  Patient/parent/caregiver has been advised that the patient may require follow up for further testing and treatment if the symptoms continue in spite of treatment, as clinically indicated and appropriate.  Patient/parent/caregiver has been advised to return to the [facility] or PCP if no better; to PCP or the Emergency Department if new signs and symptoms develop, or if the current signs or symptoms continue to change or worsen for further  workup, evaluation and treatment as clinically indicated and appropriate  The patient will follow up with their current PCP if and as advised. If the patient does not currently have a PCP we will assist them in obtaining one.   The patient may need specialty follow up if the symptoms continue, in spite of conservative treatment and management, for further workup, evaluation, consultation and treatment as clinically indicated and appropriate.  Patient/parent/caregiver verbalized understanding and agreement of plan as discussed.  All questions were addressed during visit.  Please see discharge instructions below for further details of plan.  This office note has been dictated using Teaching laboratory technician.  Unfortunately, this method of dictation can sometimes lead to typographical or grammatical errors.  I apologize for your inconvenience in advance if this occurs.  Please do not hesitate to reach out to me if clarification is needed.      Joesph Shaver Scales, PA-C 09/11/23 1121

## 2023-09-11 NOTE — Discharge Instructions (Addendum)
 Sus sntomas y Theatre manager de mi examen fsico son preocupantes, ya que podran estar exacerbando sus alergias subyacentes.  Para evitar contraer infecciones respiratorias frecuentes, reacciones cutneas, irritacin ocular, prdida de sueo, ausencias al trabajo, etc., debido a alergias no controladas, es importante que comience y contine su tratamiento para la alergia y sea constante con la toma de sus medicamentos exactamente como se Corporate treasurer.  Lea a continuacin para obtener ms informacin [employer], las dosis y Print production planner de uso que recomiendo para Paramedic sus sntomas y que se sienta mejor pronto: Zyrtec  (cetirizina): Este es un excelente antihistamnico de segunda generacin que ayuda a reducir la respuesta inflamatoria respiratoria a los alrgenos [place]. En algunos pacientes, este medicamento puede causar somnolencia diurna, por lo que recomiendo tomar una tableta al da antes de acostarse.  Flonase  (fluticasona): Este es un aerosol nasal con esteroides que se usa  una vez al da, una aplicacin en cada fosa nasal. Funciona mejor cuando se usa  a diario. Este medicamento no es eficaz si solo se usa  cuando se cree necesario. Despus de 3 a 5 das de [place], notar una reduccin significativa de la inflamacin y la produccin de moco causadas por la exposicin a alrgenos, ya sean estacionales o ambientales. El efecto secundario ms comn de este medicamento es la hemorragia nasal. Si experimenta una hemorragia nasal, suspenda su uso durante una semana y luego puede [place]. Si su seguro no cubre [employer], considere otros esteroides nasales como Nasonex (mometasona) o Nasacort  (triamcinolona).  Pataday  (olopatadina): Este es un colirio antihistamnico que se puede usar una vez al da para ayudar a Paramedic la sequedad, el picor y el enrojecimiento ocular. Este colirio antihistamnico no solo funciona para la conjuntivitis Counselling psychologist, sino que tambin es muy til para la  conjuntivitis viral. No lo use ms de Medical sales representative; para un mejor alivio, selo por la [place].  Para ayudarle a dormir por la noche, le proporcion una receta para un medicamento llamado Atarax  (hidroxizina). Tome 1 comprimido segn sea necesario aproximadamente 1 hora antes de acostarse.  Si los sntomas no han mejorado significativamente en los prximos 5 a [address], regrese para una nueva evaluacin o una cita de seguimiento con su mdico habitual. Si los sntomas han [employer] prximos 3 a 5 das, regrese para una nueva evaluacin o una cita de seguimiento con su mdico habitual.  Gracias por visitar nuestra sala de urgencias hoy. Agradecemos la oportunidad de participar en su atencin.   Your symptoms and my physical exam findings are concerning for exacerbation of your underlying allergies.     To avoid catching frequent respiratory infections, having skin reactions, dealing with eye irritation, losing sleep, missing work, etc., due to uncontrolled allergies, it is important that you begin/continue your allergy regimen and are consistent with taking your meds exactly as prescribed.   Please read below to learn more about the medications, dosages and frequencies that I recommend to help alleviate your symptoms and to get you feeling better soon: Zyrtec  (cetirizine ): This is an excellent second-generation antihistamine that helps to reduce respiratory inflammatory response to environmental allergens.  In some patients, this medication can cause daytime sleepiness so I recommend that you take 1 tablet daily at bedtime.     Flonase  (fluticasone ): This is a steroid nasal spray that used once daily, 1 spray in each nare.  This works best when used on a daily basis. This medication does not work well if it is only used when you  think you need it.  After 3 to 5 days of use, you will notice significant reduction of the inflammation and mucus production that is currently being caused by exposure  to allergens, whether seasonal or environmental.  The most common side effect of this medication is nosebleeds.  If you experience a nosebleed, please discontinue use for 1 week, then feel free to resume.  If you find that your insurance will not pay for this medication, please consider a different nasal steroids such as Nasonex (mometasone ), or Nasacort  (triamcinolone ).   Pataday  (olopatadine ): This is an antihistamine eyedrop that can be used once daily to help relieve dry eyes, itchy eyes and red eyes.  This antihistamine drop not only works for allergic conjunctivitis but is also very helpful with viral conjunctivitis.  Please do not use this drop more than once a day, for best relief please use this in the morning.    To help you sleep at night, I provided you with a prescription for a medication called Atarax  (hydroxyzine ).  Please take 1 tablet as needed approximately 1 hour before you plan to go to bed.   If symptoms have not meaningfully improved in the next 5 to 7 days, please return for repeat evaluation or follow-up with your regular provider.  If symptoms have worsened in the next 3 to 5 days, please return for repeat evaluation or follow-up with your regular provider.    Thank you for visiting urgent care today.  We appreciate the opportunity to participate in your care.

## 2023-09-12 ENCOUNTER — Other Ambulatory Visit: Payer: Self-pay

## 2023-09-13 ENCOUNTER — Other Ambulatory Visit: Payer: Self-pay

## 2023-09-23 ENCOUNTER — Other Ambulatory Visit: Payer: Self-pay

## 2023-09-23 ENCOUNTER — Encounter (HOSPITAL_COMMUNITY): Payer: Self-pay | Admitting: Emergency Medicine

## 2023-09-23 ENCOUNTER — Emergency Department (HOSPITAL_COMMUNITY)
Admission: EM | Admit: 2023-09-23 | Discharge: 2023-09-23 | Disposition: A | Discharge status: Home / Self Care | Payer: Self-pay | Attending: Emergency Medicine | Admitting: Emergency Medicine

## 2023-09-23 DIAGNOSIS — H00012 Hordeolum externum right lower eyelid: Secondary | ICD-10-CM | POA: Insufficient documentation

## 2023-09-23 MED ORDER — CEPHALEXIN 500 MG PO CAPS: 500.0000 mg | ORAL_CAPSULE | Freq: Three times a day (TID) | ORAL | 0 refills | Status: DC

## 2023-09-23 MED ORDER — HYDROCODONE-ACETAMINOPHEN 5-325 MG PO TABS
1.0000 | ORAL_TABLET | Freq: Once | ORAL | Status: AC
  Administered 2023-09-23: 1 via ORAL
  Filled 2023-09-23: qty 1

## 2023-09-23 NOTE — Discharge Instructions (Signed)
 You have a stye on the inner lower lid of the right eye.  This is treated with warm compresses.  You can do this for 10 to 15 minutes up to 5 times a day.  If it does not improve with this I have sent antibiotic into the pharmacy for you.  If it still does not improve I have listed ophthalmology information that she can follow-up with next week.  Return for any concerning symptoms.

## 2023-09-23 NOTE — ED Provider Notes (Signed)
 [place] EMERGENCY DEPARTMENT AT [facility] Provider Note   CSN: [id number] Arrival date & time: 09/23/23  2057     Patient presents with: Eye Pain   Boluwatife Flight is a 53 y.o. male.   53 year old male presents today for concern of right eye pain.  This started yesterday.  Denies any drainage, vision change, working around flying debris.  He does work as a Education administrator.  Denies any other complaints.  The history is provided by the patient. A language interpreter was used.  Eye Pain       Prior to Admission medications   Medication Sig Start Date End Date Taking? Authorizing Provider  cephALEXin  (KEFLEX ) 500 MG capsule Take 1 capsule (500 mg total) by mouth 3 (three) times daily for 7 days. 09/23/23 09/30/23 Yes Pamella Samons, PA-C  cetirizine  (ZYRTEC  ALLERGY) 10 MG tablet Take 1 tablet (10 mg total) by mouth at bedtime. 09/11/23 12/11/23  Joesph Shaver Scales, PA-C  Clobetasol  Prop Emollient Base 0.05 % emollient cream Apply topically 2 (two) times daily. 05/02/23   Zelaya, Oscar A, PA-C  Clobetasol  Prop Emollient Base 0.05 % emollient cream Apply 2 Applications topically 2 (two) times daily. 05/03/23   Zelaya, Oscar A, PA-C  fluticasone  (FLONASE ) 50 MCG/ACT nasal spray Place 1 spray into both nostrils daily. Begin by using 2 sprays in each nare daily for 3 to 5 days, then decrease to 1 spray in each nare daily. 09/11/23   Joesph Shaver Scales, PA-C  hydrOXYzine  (VISTARIL ) 25 MG capsule Take 1 capsule (25 mg total) by mouth at bedtime as needed (sleep). For anxiety 09/11/23   Joesph Shaver Scales, PA-C  Lidocaine , Anorectal, 5 % CREA APPLY RECTALLY AS NEEDED 05/26/23   Danton Jon HERO, PA-C  Olopatadine  HCl (PATADAY ) 0.2 % SOLN Apply 1 drop to eye daily. 09/11/23   Joesph Shaver Scales, PA-C  polyethylene glycol powder (GLYCOLAX /MIRALAX ) 17 GM/SCOOP powder MIX 17 GRAMS IN 4 TO 8 OUNCES OF WATER/JUICE AND TAKE BY MOUTH TWO TIMES DAILY AS NEEDED. 01/17/23     triamcinolone   cream (KENALOG ) 0.1 % Apply 1 Application topically 2 (two) times daily to affected area on arms until better. 04/29/23   Vonna Sharlet POUR, MD  valACYclovir  (VALTREX ) 1000 MG tablet Take 1 tablet (1,000 mg total) by mouth daily. 03/28/23   Reddick, Johnathan B, NP  SUMAtriptan  (IMITREX ) 50 MG tablet Take one for headache.  May repeat in 2 hours.  No more than 2 a day 05/27/17 12/09/21  Maranda Jamee Jacob, MD    Allergies: Amoxicillin , Cyclobenzaprine , Ibuprofen , Methocarbamol , and Penicillins    Review of Systems  Constitutional:  Negative for fever.  Eyes:  Positive for pain. Negative for photophobia, redness and visual disturbance.  All other systems reviewed and are negative.   Updated Vital Signs BP 114/80 (BP Location: Left Arm)   Pulse 78   Temp 98.4 F (36.9 C) (Oral)   Resp 18   SpO2 90%   Physical Exam Vitals and nursing note reviewed.  Constitutional:      General: He is not in acute distress.    Appearance: Normal appearance. He is not ill-appearing.  HENT:     Head: Normocephalic and atraumatic.     Nose: Nose normal.  Eyes:     Extraocular Movements: Extraocular movements intact.     Conjunctiva/sclera: Conjunctivae normal.     Pupils: Pupils are equal, round, and reactive to light.     Comments: No discharge.  Without conjunctivitis.  Obvious  stye to the right lower internal eyelid.  Cardiovascular:     Rate and Rhythm: Normal rate.  Pulmonary:     Effort: Pulmonary effort is normal. No respiratory distress.  Musculoskeletal:        General: No deformity.  Skin:    Findings: No rash.  Neurological:     Mental Status: He is alert.     (all labs ordered are listed, but only abnormal results are displayed) Labs Reviewed - No data to display  EKG: None  Radiology: No results found.   Procedures   Medications Ordered in the ED - No data to display                                  Medical Decision Making Risk Prescription drug  management.   53 year old male presents today for concern of eye pain.  On exam he has an internal stye of the right lower eyelid.  Supportive care discussed at length.  Keflex  prescribed to keep on hand in case symptoms not improved.  Optho referral given in case if symptoms not improved by next week. Patient is in agreement. Discharged in stable condition. Dose of Norco given at his request due to pain. Hemodynamically stable.  Nontoxic-appearing.  Final diagnoses:  Hordeolum externum of right lower eyelid    ED Discharge Orders          Ordered    cephALEXin  (KEFLEX ) 500 MG capsule  3 times daily        09/23/23 2151               Hildegard Loge, PA-C 09/23/23 2154    Dasie Faden, MD 09/23/23 2251

## 2023-09-23 NOTE — ED Triage Notes (Signed)
 Patient c/o right eye pain d/t stye on his lower eye lid. Patient denies fever.

## 2023-09-26 ENCOUNTER — Other Ambulatory Visit: Payer: Self-pay

## 2023-09-26 ENCOUNTER — Encounter (HOSPITAL_COMMUNITY): Payer: Self-pay | Admitting: *Deleted

## 2023-09-26 ENCOUNTER — Ambulatory Visit (HOSPITAL_COMMUNITY)
Admission: EM | Admit: 2023-09-26 | Discharge: 2023-09-26 | Disposition: A | Discharge status: Home / Self Care | Payer: Self-pay | Attending: Family Medicine | Admitting: Family Medicine

## 2023-09-26 DIAGNOSIS — H00012 Hordeolum externum right lower eyelid: Secondary | ICD-10-CM

## 2023-09-26 MED ORDER — CEPHALEXIN 500 MG PO CAPS
500.0000 mg | ORAL_CAPSULE | Freq: Three times a day (TID) | ORAL | 0 refills | Status: AC
Start: 2023-09-26 — End: 2023-10-03
  Filled 2023-09-26: qty 21, 7d supply, fill #0

## 2023-09-26 MED ORDER — CEPHALEXIN 500 MG PO CAPS: 500.0000 mg | ORAL_CAPSULE | Freq: Three times a day (TID) | ORAL | 0 refills | Status: DC

## 2023-09-26 NOTE — ED Triage Notes (Signed)
 Professional interpretor used for clinical intake.   Pt states that his right lower eye lid pimple which he was seen for last week at ED. He was given antibiotics which he did not pick up he thought he wasn't supposed to take them.   He has been taking advil .

## 2023-09-26 NOTE — ED Provider Notes (Signed)
 [facility]    CSN: [id number] Arrival date & time: 09/26/23  1208      History   Chief Complaint No chief complaint on file.   [name] is a 53 y.o. male.   Patient is here for right lower eyelid pain and redness.  He was seen in the ER several days ago.  Dx with stye of the lower eyelid.  He was recommended warm compresses.  Keflex  was sent to the pharmacy, but not aware he should start this.  He is back today b/c not improving.     Past Medical History:  Diagnosis Date   Anxiety    Asthma    GERD (gastroesophageal reflux disease)    Herpes    Shingles    Umbilical hernia     Patient Active Problem List   Diagnosis Date Noted   Acute bilateral low back pain without sciatica 06/05/2020   Acute pain of right shoulder 06/05/2020   Asthma due to environmental allergies 12/20/2016   HSV (herpes simplex virus) infection 02/24/2015   Anxiety 04/02/2014    Past Surgical History:  Procedure Laterality Date   MOUTH SURGERY         Home Medications    Prior to Admission medications   Medication Sig Start Date End Date Taking? Authorizing Provider  cetirizine  (ZYRTEC  ALLERGY) 10 MG tablet Take 1 tablet (10 mg total) by mouth at bedtime. 09/11/23 12/11/23 Yes Joesph Shaver Scales, PA-C  Clobetasol  Prop Emollient Base 0.05 % emollient cream Apply 2 Applications topically 2 (two) times daily. 05/03/23  Yes Zelaya, Oscar A, PA-C  Clobetasol  Prop Emollient Base 0.05 % emollient cream Apply topically 2 (two) times daily. 05/02/23  Yes Zelaya, Oscar A, PA-C  fluticasone  (FLONASE ) 50 MCG/ACT nasal spray Place 1 spray into both nostrils daily. Begin by using 2 sprays in each nare daily for 3 to 5 days, then decrease to 1 spray in each nare daily. 09/11/23  Yes Joesph Shaver Scales, PA-C  hydrOXYzine  (VISTARIL ) 25 MG capsule Take 1 capsule (25 mg total) by mouth at bedtime as needed (sleep). For anxiety 09/11/23  Yes Joesph Shaver Scales, PA-C   Lidocaine , Anorectal, 5 % CREA APPLY RECTALLY AS NEEDED 05/26/23  Yes McClung, Angela M, PA-C  Olopatadine  HCl (PATADAY ) 0.2 % SOLN Apply 1 drop to eye daily. 09/11/23  Yes Joesph Shaver Scales, PA-C  polyethylene glycol powder (GLYCOLAX /MIRALAX ) 17 GM/SCOOP powder MIX 17 GRAMS IN 4 TO 8 OUNCES OF WATER/JUICE AND TAKE BY MOUTH TWO TIMES DAILY AS NEEDED. 01/17/23  Yes   triamcinolone  cream (KENALOG ) 0.1 % Apply 1 Application topically 2 (two) times daily to affected area on arms until better. 04/29/23  Yes Vonna Sharlet POUR, MD  valACYclovir  (VALTREX ) 1000 MG tablet Take 1 tablet (1,000 mg total) by mouth daily. 03/28/23  Yes Reddick, Johnathan B, NP  cephALEXin  (KEFLEX ) 500 MG capsule Take 1 capsule (500 mg total) by mouth 3 (three) times daily for 7 days. 09/26/23 10/03/23  Samyra Limb, Rocky, MD  SUMAtriptan  (IMITREX ) 50 MG tablet Take one for headache.  May repeat in 2 hours.  No more than 2 a day 05/27/17 12/09/21  Maranda Jamee Jacob, MD    Family History Family History  Problem Relation Age of Onset   Cancer Father     Social History Social History   Tobacco Use   Smoking status: Former    Passive exposure: Past   Smokeless tobacco: Never  Vaping Use   Vaping status: Never  Used  Substance Use Topics   Alcohol use: No   Drug use: No     Allergies   Amoxicillin , Cyclobenzaprine , Ibuprofen , Methocarbamol , and Penicillins   Review of Systems Review of Systems  Constitutional: Negative.   HENT: Negative.    Eyes:  Positive for pain.  Cardiovascular: Negative.   Gastrointestinal: Negative.   Musculoskeletal: Negative.   Psychiatric/Behavioral: Negative.       Physical Exam Triage Vital Signs ED Triage Vitals  Encounter Vitals Group     BP 09/26/23 1307 109/61     Girls Systolic BP Percentile --      Girls Diastolic BP Percentile --      Boys Systolic BP Percentile --      Boys Diastolic BP Percentile --      Pulse Rate 09/26/23 1307 76     Resp 09/26/23 1307 16     Temp  09/26/23 1307 98.4 F (36.9 C)     Temp Source 09/26/23 1307 Oral     SpO2 09/26/23 1307 97 %     Weight --      Height --      Head Circumference --      Peak Flow --      Pain Score 09/26/23 1306 8     Pain Loc --      Pain Education --      Exclude from Growth Chart --    No data found.  Updated Vital Signs BP 109/61 (BP Location: Left Arm)   Pulse 76   Temp 98.4 F (36.9 C) (Oral)   Resp 16   SpO2 97%   Visual Acuity Right Eye Distance: 20/40 Left Eye Distance: unable to read any Bilateral Distance: 20/40 (forgot glasses at house)  Right Eye Near:   Left Eye Near:    Bilateral Near:     Physical Exam Constitutional:      Appearance: Normal appearance. He is normal weight.  Eyes:     Comments: No redness to the sclera;  there is a small pimple like lesion to the right lower eyelid.  The right conjunctiva is red/injected;  mild d/c noted  Neurological:     General: No focal deficit present.     Mental Status: He is alert.  Psychiatric:        Mood and Affect: Mood normal.      UC Treatments / Results  Labs (all labs ordered are listed, but only abnormal results are displayed) Labs Reviewed - No data to display  EKG   Radiology No results found.  Procedures Procedures (including critical care time)  Medications Ordered in UC Medications - No data to display  Initial Impression / Assessment and Plan / UC Course  I have reviewed the triage vital signs and the nursing notes.  Pertinent labs & imaging results that were available during my care of the patient were reviewed by me and considered in my medical decision making (see chart for details).   Final Clinical Impressions(s) / UC Diagnoses   Final diagnoses:  Hordeolum externum of right lower eyelid     Discharge Instructions      Lo revisaron hoy por una infeccin en el prpado inferior derecho. Ya le recet el antibitico que le recetaron en urgencias. Tambin le recomiendo compresas  tibias. Si no mejora, consulte con un oftalmlogo. Puede llamar a [employer] al [phone].  You were seen today for infection of the right lower eyelid.  I have resent the antibiotic  given by the Emergency Room.  I recommend warm compresses as well.  If this is not improving then please follow up with an eye specialist.  You may call [facility] at [phone].    ED Prescriptions     Medication Sig Dispense Auth. Provider   cephALEXin  (KEFLEX ) 500 MG capsule Take 1 capsule (500 mg total) by mouth 3 (three) times daily for 7 days. 21 capsule Darral Longs, MD      PDMP not reviewed this encounter.   Darral Longs, MD 09/26/23 [phone]

## 2023-09-26 NOTE — Discharge Instructions (Signed)
 Lo revisaron hoy por una infeccin en el prpado inferior derecho. Ya le recet el antibitico que le recetaron en urgencias. Tambin le recomiendo compresas tibias. Si no mejora, consulte con un oftalmlogo. Puede llamar a [employer] al [phone].  You were seen today for infection of the right lower eyelid.  I have resent the antibiotic given by the Emergency Room.  I recommend warm compresses as well.  If this is not improving then please follow up with an eye specialist.  You may call [facility] at [phone].

## 2023-10-05 ENCOUNTER — Other Ambulatory Visit: Payer: Self-pay

## 2023-10-05 MED ORDER — NEOMYCIN-POLYMYXIN-DEXAMETH 0.1 % OP OINT
1.0000 | TOPICAL_OINTMENT | Freq: Two times a day (BID) | OPHTHALMIC | 0 refills | Status: DC
  Filled 2023-10-05: qty 3.5, 2d supply, fill #0

## 2023-10-27 ENCOUNTER — Other Ambulatory Visit: Payer: Self-pay

## 2023-11-30 ENCOUNTER — Other Ambulatory Visit: Payer: Self-pay | Admitting: Nurse Practitioner

## 2023-11-30 ENCOUNTER — Telehealth: Payer: Self-pay

## 2023-11-30 ENCOUNTER — Other Ambulatory Visit: Payer: Self-pay

## 2023-11-30 DIAGNOSIS — R053 Chronic cough: Secondary | ICD-10-CM

## 2023-11-30 DIAGNOSIS — J984 Other disorders of lung: Secondary | ICD-10-CM

## 2023-11-30 NOTE — Telephone Encounter (Signed)
 Patient is requesting refills on budesonide -formoterol  (SYMBICORT ) 160-4.5 MCG/ACT inhaler . Advised patient he may need an appointment for refills as it shows that its discontinued.

## 2023-12-02 ENCOUNTER — Other Ambulatory Visit: Payer: Self-pay

## 2023-12-02 ENCOUNTER — Encounter (HOSPITAL_COMMUNITY): Payer: Self-pay | Admitting: Emergency Medicine

## 2023-12-02 ENCOUNTER — Ambulatory Visit (HOSPITAL_COMMUNITY)
Admission: EM | Admit: 2023-12-02 | Discharge: 2023-12-02 | Disposition: A | Discharge status: Home / Self Care | Payer: Self-pay | Attending: Physician Assistant | Admitting: Physician Assistant

## 2023-12-02 DIAGNOSIS — J4521 Mild intermittent asthma with (acute) exacerbation: Secondary | ICD-10-CM

## 2023-12-02 DIAGNOSIS — M79652 Pain in left thigh: Secondary | ICD-10-CM

## 2023-12-02 MED ORDER — IPRATROPIUM-ALBUTEROL 0.5-2.5 (3) MG/3ML IN SOLN
3.0000 mL | Freq: Once | RESPIRATORY_TRACT | Status: AC
  Administered 2023-12-02: 3 mL via RESPIRATORY_TRACT

## 2023-12-02 MED ORDER — ALBUTEROL SULFATE HFA 108 (90 BASE) MCG/ACT IN AERS
1.0000 | INHALATION_SPRAY | Freq: Four times a day (QID) | RESPIRATORY_TRACT | 1 refills | Status: AC | PRN
  Filled 2023-12-02: qty 6.7, 25d supply, fill #0

## 2023-12-02 MED ORDER — PREDNISONE 20 MG PO TABS
40.0000 mg | ORAL_TABLET | Freq: Every day | ORAL | 0 refills | Status: AC
  Filled 2023-12-02: qty 10, 5d supply, fill #0

## 2023-12-02 MED ORDER — IPRATROPIUM-ALBUTEROL 0.5-2.5 (3) MG/3ML IN SOLN
RESPIRATORY_TRACT | Status: AC
  Filled 2023-12-02: qty 3

## 2023-12-02 NOTE — ED Triage Notes (Addendum)
 Used spanish interpretor  Pt requesting refill of Symbicort . Went by PCP yesterday to get refill but was told that doctor not there and no other doctor could help.  Pt reports has cough for several days and would like an xray. Reports that chest hurts when coughs.

## 2023-12-02 NOTE — Telephone Encounter (Addendum)
 Patient came In the office today. Patient is scheduled for 11/17 at 8:50 am with PCP for medication refill.

## 2023-12-02 NOTE — Discharge Instructions (Addendum)
 You were seen today for concerns of persistent coughing, chest tightness and acute left leg pain.  We administered a Duoneb breathing treatment today to help with your coughing and breathing.  This provided some relief and you report that you are feeling better after taking it.  We are sending you home with an Albuterol  rescue inhaler that you can use up to every 6 hours as needed for shortness of breath or coughing spells  I have sent in a script for Prednisone  burst to be taken in the morning with breakfast. Remember that steroids can cause sleeplessness, irritability, increased hunger and elevated glucose levels so be mindful of these side effects. They should lessen as you finish the medication course.   If you notice more significant symptoms such as severe difficulty breathing, shortness of breath, fevers that are not responding to Tylenol  or ibuprofen , severe leg swelling, chest pain please go to the nearest emergency room immediately as these could be signs of a medical emergency.  Hoy acudi a consulta por tos persistente, opresin en el pecho y dolor agudo en la pierna izquierda.  Le administramos un tratamiento respiratorio con Duoneb para ayudarle con la tos y la respiracin.  Esto le proporcion cierto alivio y usted refiere sentirse mejor despus de tomarlo.  Le enviamos a casa un inhalador de rescate de Albuterol  que puede usar hasta cada 6 horas, segn sea necesario, para la dificultad para respirar o los ataques de tos.  He enviado una receta para un tratamiento con dosis altas de Prednisona para que la tome por la maana con el desayuno. Recuerde que los esteroides pueden causar insomnio, irritabilidad, aumento del apetito y corporate treasurer de los niveles de [place], as que tenga en cuenta estos efectos secundarios. Deberan disminuir a medida que insurance claims handler.  Si nota sntomas ms graves, como dificultad respiratoria severa, falta de aire, fiebre que no responde al  paracetamol o al ibuprofeno, hinchazn severa de las piernas o dolor en el pecho, acuda inmediatamente al servicio de urgencias ms cercano, ya que podran ser seales de una emergencia mdica.

## 2023-12-02 NOTE — Telephone Encounter (Signed)
 Copied from CRM #[id number]. Topic: Clinical - Prescription Issue >> Dec 02, 2023  9:13 AM Emylou G wrote:  Reason for CRM: Patient called - said he was going to come and discuss his:  Budesonide -Formoterol  Fumarate - I adv him it isn't on his current med list and we need to make an appt since per the notes hasn't been seen since 2024 - Zelda isn't available until Dec.. I tried to discuss appt options - he needs a nurse, learning disability but he decided he is going to come in the office and speak with us  directly.

## 2023-12-02 NOTE — ED Triage Notes (Signed)
 Neb tx completed. Pt states he feels different and better, as if something opened up.  Provider notified.

## 2023-12-02 NOTE — ED Provider Notes (Signed)
 [facility]    CSN: [id number] Arrival date & time: 12/02/23  1015      History   Chief Complaint Chief Complaint  Patient presents with   Medication Refill   Cough    [name] is a 53 y.o. male.   HPI   Spanish interpretor utilized for encounter: Avelina #[id number]  Pt states that he has had a cough for several weeks and has a previous hx of asthma. He would like a script for an asthma medication  She also feels discomfort in his chest and is having left leg pain  He denies productive coughing.  He reports that he was using Symbicort  previously but it expired and prescription ran out. He usually just uses it when he develops a cough and does not use anything on a daily basis  He denies swelling of the left leg but states that when he presses on It with his fingers it hurts and bothers him.  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 which may be contributing to his pain  He reports that he has not been taking anything for the pain  He reports the pain has been present for 4-5 days and that this started after the chest discomfort    Past Medical History:  Diagnosis Date   Anxiety    Asthma    GERD (gastroesophageal reflux disease)    Herpes    Shingles    Umbilical hernia     Patient Active Problem List   Diagnosis Date Noted   Acute bilateral low back pain without sciatica 06/05/2020   Acute pain of right shoulder 06/05/2020   Asthma due to environmental allergies 12/20/2016   HSV (herpes simplex virus) infection 02/24/2015   Anxiety 04/02/2014    Past Surgical History:  Procedure Laterality Date   MOUTH SURGERY         Home Medications    Prior to Admission medications   Medication Sig Start Date End Date Taking? Authorizing Provider  albuterol  (VENTOLIN  HFA) 108 (90 Base) MCG/ACT inhaler Inhale 1-2 puffs into the lungs every 6 (six) hours as needed for wheezing or shortness of breath.  12/02/23  Yes Aison Malveaux E, PA-C  predniSONE  (DELTASONE ) 20 MG tablet Take 2 tablets (40 mg total) by mouth daily for 5 days. 12/02/23 12/07/23 Yes Perl Folmar E, PA-C  cetirizine  (ZYRTEC  ALLERGY) 10 MG tablet Take 1 tablet (10 mg total) by mouth at bedtime. 09/11/23 12/11/23  Joesph Shaver Scales, PA-C  Clobetasol  Prop Emollient Base 0.05 % emollient cream Apply 2 Applications topically 2 (two) times daily. 05/03/23   Zelaya, Oscar A, PA-C  Clobetasol  Prop Emollient Base 0.05 % emollient cream Apply topically 2 (two) times daily. 05/02/23   Zelaya, Oscar A, PA-C  fluticasone  (FLONASE ) 50 MCG/ACT nasal spray Place 1 spray into both nostrils daily. Begin by using 2 sprays in each nare daily for 3 to 5 days, then decrease to 1 spray in each nare daily. 09/11/23   Joesph Shaver Scales, PA-C  hydrOXYzine  (VISTARIL ) 25 MG capsule Take 1 capsule (25 mg total) by mouth at bedtime as needed (sleep). For anxiety 09/11/23   Joesph Shaver Scales, PA-C  Lidocaine , Anorectal, 5 % CREA APPLY RECTALLY AS NEEDED 05/26/23   Danton Jon HERO, PA-C  neomycin -polymyxin-dexameth (MAXITROL ) 0.1 % OINT Apply a small amount in the conjunctival sac into the affected eye at bedtime. 10/05/23   Maree Lonni Inks, MD  Olopatadine   HCl (PATADAY ) 0.2 % SOLN Apply 1 drop to eye daily. 09/11/23   Joesph Shaver Scales, PA-C  polyethylene glycol powder (GLYCOLAX /MIRALAX ) 17 GM/SCOOP powder MIX 17 GRAMS IN 4 TO 8 OUNCES OF WATER/JUICE AND TAKE BY MOUTH TWO TIMES DAILY AS NEEDED. 01/17/23     triamcinolone  cream (KENALOG ) 0.1 % Apply 1 Application topically 2 (two) times daily to affected area on arms until better. 04/29/23   Vonna Sharlet POUR, MD  valACYclovir  (VALTREX ) 1000 MG tablet Take 1 tablet (1,000 mg total) by mouth daily. 03/28/23   Reddick, Johnathan B, NP  SUMAtriptan  (IMITREX ) 50 MG tablet Take one for headache.  May repeat in 2 hours.  No more than 2 a day 05/27/17 12/09/21  Maranda Jamee Jacob, MD    Family  History Family History  Problem Relation Age of Onset   Cancer Father     Social History Social History   Tobacco Use   Smoking status: Former    Passive exposure: Past   Smokeless tobacco: Never  Vaping Use   Vaping status: Never Used  Substance Use Topics   Alcohol use: No   Drug use: No     Allergies   Amoxicillin , Cyclobenzaprine , Ibuprofen , Methocarbamol , and Penicillins   Review of Systems Review of Systems  Constitutional:  Negative for chills, fatigue and fever.  Respiratory:  Positive for cough and chest tightness. Negative for shortness of breath and wheezing.        Chest discomfort with coughing    Musculoskeletal:        Left leg pain       Physical Exam Triage Vital Signs ED Triage Vitals  Encounter Vitals Group     BP 12/02/23 1030 114/74     Girls Systolic BP Percentile --      Girls Diastolic BP Percentile --      Boys Systolic BP Percentile --      Boys Diastolic BP Percentile --      Pulse Rate 12/02/23 1030 81     Resp 12/02/23 1030 17     Temp 12/02/23 1030 98.5 F (36.9 C)     Temp Source 12/02/23 1030 Oral     SpO2 12/02/23 1030 97 %     Weight --      Height --      Head Circumference --      Peak Flow --      Pain Score 12/02/23 1029 2     Pain Loc --      Pain Education --      Exclude from Growth Chart --    No data found.  Updated Vital Signs BP 114/74 (BP Location: Left Arm)   Pulse 71   Temp 98.5 F (36.9 C) (Oral)   Resp 18   SpO2 98% Comment: RA post neb  Visual Acuity Right Eye Distance:   Left Eye Distance:   Bilateral Distance:    Right Eye Near:   Left Eye Near:    Bilateral Near:     Physical Exam Vitals reviewed.  Constitutional:      General: He is awake.     Appearance: Normal appearance. He is well-developed and well-groomed.  HENT:     Head: Normocephalic and atraumatic.  Eyes:     Extraocular Movements: Extraocular movements intact.     Conjunctiva/sclera: Conjunctivae normal.   Cardiovascular:     Rate and Rhythm: Normal rate and regular rhythm.     Heart sounds: Normal heart sounds. No  murmur heard.    No friction rub. No gallop.  Pulmonary:     Effort: Pulmonary effort is normal.     Breath sounds: Normal breath sounds. No decreased air movement. No decreased breath sounds, wheezing, rhonchi or rales.  Musculoskeletal:     Cervical back: Normal range of motion.     Right upper leg: No swelling.     Left upper leg: Tenderness present. No swelling.       Legs:  Neurological:     Mental Status: He is alert and oriented to person, place, and time.  Psychiatric:        Attention and Perception: Attention normal.        Mood and Affect: Mood normal.        Speech: Speech normal.        Behavior: Behavior normal. Behavior is cooperative.      UC Treatments / Results  Labs (all labs ordered are listed, but only abnormal results are displayed) Labs Reviewed - No data to display  EKG   Radiology No results found.  Procedures Procedures (including critical care time)  Medications Ordered in UC Medications  ipratropium-albuterol  (DUONEB) 0.5-2.5 (3) MG/3ML nebulizer solution 3 mL (3 mLs Nebulization Given 12/02/23 1101)    Initial Impression / Assessment and Plan / UC Course  I have reviewed the triage vital signs and the nursing notes.  Pertinent labs & imaging results that were available during my care of the patient were reviewed by me and considered in my medical decision making (see chart for details).    Duoneb completed and patient reports that his breathing is better and he feels like his chest is more open.   Final Clinical Impressions(s) / UC Diagnoses   Final diagnoses:  Mild intermittent asthma with acute exacerbation  Acute pain of left thigh   Patient presents today with concerns for a dry cough that has been persistent for the last few weeks.  He reports that he has a previous history of asthma but does not typically use an  inhaler and his previous prescription for Symbicort  is no longer valid.  He also reports concerns for left thigh pain that is particularly bothered by pressure or palpation.  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  Physical exam is negative for signs of increased pulmonary effort or abnormal pulmonary sounds on auscultation.  Patient does report improved breathing following DuoNeb administration.  Physical exam of the left thigh does not reveal swelling, tightness or hardness, bruising, obvious wound.  At this time I suspect likely muscular etiology and recommend conservative measures with OTC Tylenol  and ibuprofen  for pain control.  Will send patient home with albuterol  rescue inhaler as well as prednisone  40 mg p.o. daily x 5-day burst.  Recommend close follow-up with PCP for ongoing medication management as needed.  ED and return precautions reviewed and provided in AVS.  Follow-up as needed.    Discharge Instructions      You were seen today for concerns of persistent coughing, chest tightness and acute left leg pain.  We administered a Duoneb breathing treatment today to help with your coughing and breathing.  This provided some relief and you report that you are feeling better after taking it.  We are sending you home with an Albuterol  rescue inhaler that you can use up to every 6 hours as needed for shortness of breath or coughing  spells  I have sent in a script for Prednisone  burst to be taken in the morning with breakfast. Remember that steroids can cause sleeplessness, irritability, increased hunger and elevated glucose levels so be mindful of these side effects. They should lessen as you finish the medication course.   If you notice more significant symptoms such as severe difficulty breathing, shortness of breath, fevers that are not responding to Tylenol  or ibuprofen , severe leg swelling, chest pain  please go to the nearest emergency room immediately as these could be signs of a medical emergency.  Hoy acudi a consulta por tos persistente, opresin en el pecho y dolor agudo en la pierna izquierda.  Le administramos un tratamiento respiratorio con Duoneb para ayudarle con la tos y la respiracin.  Esto le proporcion cierto alivio y usted refiere sentirse mejor despus de tomarlo.  Le enviamos a casa un inhalador de rescate de Albuterol  que puede usar hasta cada 6 horas, segn sea necesario, para la dificultad para respirar o los ataques de tos.  He enviado una receta para un tratamiento con dosis altas de Prednisona para que la tome por la maana con el desayuno. Recuerde que los esteroides pueden causar insomnio, irritabilidad, aumento del apetito y corporate treasurer de los niveles de [place], as que tenga en cuenta estos efectos secundarios. Deberan disminuir a medida que insurance claims handler.  Si nota sntomas ms graves, como dificultad respiratoria severa, falta de aire, fiebre que no responde al paracetamol o al ibuprofeno, hinchazn severa de las piernas o dolor en el pecho, acuda inmediatamente al servicio de urgencias ms cercano, ya que podran ser seales de una emergencia mdica.      ED Prescriptions     Medication Sig Dispense Auth. Provider   albuterol  (VENTOLIN  HFA) 108 (90 Base) MCG/ACT inhaler Inhale 1-2 puffs into the lungs every 6 (six) hours as needed for wheezing or shortness of breath. 6.7 g Jaleeyah Munce E, PA-C   predniSONE  (DELTASONE ) 20 MG tablet Take 2 tablets (40 mg total) by mouth daily for 5 days. 10 tablet Roth Ress E, PA-C      PDMP not reviewed this encounter.   Marylene Rocky BRAVO, PA-C 12/02/23 [id number]

## 2023-12-05 ENCOUNTER — Ambulatory Visit: Payer: Self-pay | Admitting: Nurse Practitioner

## 2023-12-06 ENCOUNTER — Other Ambulatory Visit: Payer: Self-pay | Admitting: Nurse Practitioner

## 2023-12-07 NOTE — Telephone Encounter (Signed)
 NEEDS APPT

## 2023-12-08 ENCOUNTER — Ambulatory Visit: Payer: Self-pay

## 2023-12-08 ENCOUNTER — Emergency Department (HOSPITAL_COMMUNITY): Payer: Self-pay

## 2023-12-08 ENCOUNTER — Other Ambulatory Visit: Payer: Self-pay

## 2023-12-08 ENCOUNTER — Encounter (HOSPITAL_COMMUNITY): Payer: Self-pay | Admitting: Emergency Medicine

## 2023-12-08 ENCOUNTER — Emergency Department (HOSPITAL_COMMUNITY)
Admission: EM | Admit: 2023-12-08 | Discharge: 2023-12-08 | Disposition: A | Discharge status: Home / Self Care | Payer: Self-pay | Attending: Emergency Medicine | Admitting: Emergency Medicine

## 2023-12-08 DIAGNOSIS — L0291 Cutaneous abscess, unspecified: Secondary | ICD-10-CM

## 2023-12-08 DIAGNOSIS — R051 Acute cough: Secondary | ICD-10-CM | POA: Insufficient documentation

## 2023-12-08 DIAGNOSIS — L02214 Cutaneous abscess of groin: Secondary | ICD-10-CM | POA: Insufficient documentation

## 2023-12-08 LAB — RESP PANEL BY RT-PCR (RSV, FLU A&B, COVID)  RVPGX2
Influenza A by PCR: NEGATIVE
Influenza B by PCR: NEGATIVE
Resp Syncytial Virus by PCR: NEGATIVE
SARS Coronavirus 2 by RT PCR: NEGATIVE

## 2023-12-08 MED ORDER — CEPHALEXIN 500 MG PO CAPS
500.0000 mg | ORAL_CAPSULE | Freq: Four times a day (QID) | ORAL | 0 refills | Status: DC
  Filled 2023-12-08: qty 20, 5d supply, fill #0

## 2023-12-08 MED ORDER — CEPHALEXIN 500 MG PO CAPS: 500.0000 mg | ORAL_CAPSULE | Freq: Four times a day (QID) | ORAL | 0 refills | Status: DC

## 2023-12-08 NOTE — Discharge Instructions (Addendum)
 Como hablamos, le recomiendo que se aplique compresas tibias de 3 a 4 veces al [facility]. Puede tomar ibuprofeno de 600 mg segn sea necesario para el dolor. Por favor, tome el antibitico segn lo prescrito. Si sus sntomas empeoran, puede regresar al erie insurance group.

## 2023-12-08 NOTE — ED Triage Notes (Signed)
 Pt arriving POV with complaint of a potential abscess on his left inner thigh that has been present x3 weeks. Pt denies drainage but says it is inflamed and he can tell there is a lot of pus in it. Pt also reports a dry cough for the last few weeks as well. Pt denies SOB.

## 2023-12-08 NOTE — ED Provider Notes (Signed)
 [place] EMERGENCY DEPARTMENT AT [facility] Provider Note   CSN: [id number] Arrival date & time: 12/08/23  1045     Patient presents with: Abscess (Left inner thigh) and Cough   Gabriel Ball is a 53 y.o. male patient who presents to the emergency department today for further evaluation of a groin abscess.  Patient does not know how long it has been there.  He states it has been intermittent in the past but is gotten more swollen causing pain and issues.  Patient also complaining of cough and requested chest x-ray.  He denies any fever chills or shortness of breath.  Does have some chest pain with a cough.    Abscess Cough      Prior to Admission medications   Medication Sig Start Date End Date Taking? Authorizing Provider  albuterol  (VENTOLIN  HFA) 108 (90 Base) MCG/ACT inhaler Inhale 1-2 puffs into the lungs every 6 (six) hours as needed for wheezing or shortness of breath. 12/02/23   Mecum, Erin E, PA-C  cephALEXin  (KEFLEX ) 500 MG capsule Take 1 capsule (500 mg total) by mouth 4 (four) times daily. 12/08/23   Theotis Peers M, PA-C  cetirizine  (ZYRTEC  ALLERGY) 10 MG tablet Take 1 tablet (10 mg total) by mouth at bedtime. 09/11/23 12/11/23  Joesph Shaver Scales, PA-C  Clobetasol  Prop Emollient Base 0.05 % emollient cream Apply 2 Applications topically 2 (two) times daily. 05/03/23   Zelaya, Oscar A, PA-C  Clobetasol  Prop Emollient Base 0.05 % emollient cream Apply topically 2 (two) times daily. 05/02/23   Zelaya, Oscar A, PA-C  fluticasone  (FLONASE ) 50 MCG/ACT nasal spray Place 1 spray into both nostrils daily. Begin by using 2 sprays in each nare daily for 3 to 5 days, then decrease to 1 spray in each nare daily. 09/11/23   Joesph Shaver Scales, PA-C  hydrOXYzine  (VISTARIL ) 25 MG capsule Take 1 capsule (25 mg total) by mouth at bedtime as needed (sleep). For anxiety 09/11/23   Joesph Shaver Scales, PA-C  Lidocaine , Anorectal, 5 % CREA APPLY RECTALLY AS NEEDED  05/26/23   Danton Jon HERO, PA-C  neomycin -polymyxin-dexameth (MAXITROL ) 0.1 % OINT Apply a small amount in the conjunctival sac into the affected eye at bedtime. 10/05/23   Maree Lonni Inks, MD  Olopatadine  HCl (PATADAY ) 0.2 % SOLN Apply 1 drop to eye daily. 09/11/23   Joesph Shaver Scales, PA-C  polyethylene glycol powder (GLYCOLAX /MIRALAX ) 17 GM/SCOOP powder MIX 17 GRAMS IN 4 TO 8 OUNCES OF WATER/JUICE AND TAKE BY MOUTH TWO TIMES DAILY AS NEEDED. 01/17/23     triamcinolone  cream (KENALOG ) 0.1 % Apply 1 Application topically 2 (two) times daily to affected area on arms until better. 04/29/23   Vonna Sharlet POUR, MD  valACYclovir  (VALTREX ) 1000 MG tablet Take 1 tablet (1,000 mg total) by mouth daily. 03/28/23   Reddick, Johnathan B, NP  SUMAtriptan  (IMITREX ) 50 MG tablet Take one for headache.  May repeat in 2 hours.  No more than 2 a day 05/27/17 12/09/21  Maranda Jamee Jacob, MD    Allergies: Amoxicillin , Cyclobenzaprine , Ibuprofen , Methocarbamol , and Penicillins    Review of Systems  Respiratory:  Positive for cough.   All other systems reviewed and are negative.   Updated Vital Signs BP 127/88 (BP Location: Right Arm)   Pulse (!) 108   Temp 98.7 F (37.1 C) (Oral)   Resp 14   SpO2 100%   Physical Exam Vitals and nursing note reviewed.  Constitutional:      General:  He is not in acute distress.    Appearance: Normal appearance.  HENT:     Head: Normocephalic and atraumatic.  Eyes:     General:        Right eye: No discharge.        Left eye: No discharge.  Cardiovascular:     Comments: Regular rate and rhythm.  S1/S2 are distinct without any evidence of murmur, rubs, or gallops.  Radial pulses are 2+ bilaterally.  Dorsalis pedis pulses are 2+ bilaterally.  No evidence of pedal edema. Pulmonary:     Comments: Clear to auscultation bilaterally.  Normal effort.  No respiratory distress.  No evidence of wheezes, rales, or rhonchi heard throughout. Abdominal:     General:  Abdomen is flat. Bowel sounds are normal. There is no distension.     Tenderness: There is no abdominal tenderness. There is no guarding or rebound.  Musculoskeletal:        General: Normal range of motion.     Cervical back: Neck supple.  Skin:    General: Skin is warm and dry.     Findings: No rash.  Neurological:     General: No focal deficit present.     Mental Status: He is alert.  Psychiatric:        Mood and Affect: Mood normal.        Behavior: Behavior normal.     (all labs ordered are listed, but only abnormal results are displayed) Labs Reviewed  RESP PANEL BY RT-PCR (RSV, FLU A&B, COVID)  [id number]    EKG: None  Radiology: DG Chest 2 View Result Date: 12/08/2023 EXAM: 2 VIEW(S) XRAY OF THE CHEST 12/08/2023 12:08:29 PM COMPARISON: 09/29/2022 CLINICAL HISTORY: cough FINDINGS: LUNGS AND PLEURA: No focal pulmonary opacity. No pleural effusion. No pneumothorax. HEART AND MEDIASTINUM: No acute abnormality of the cardiac and mediastinal silhouettes. BONES AND SOFT TISSUES: No acute osseous abnormality. IMPRESSION: 1. No acute cardiopulmonary process. Electronically signed by: Oneil Devonshire MD 12/08/2023 12:54 PM EST RP Workstation: [id number]     .Incision and Drainage  Date/Time: 12/08/2023 1:14 PM  Performed by: Theotis Cameron HERO, PA-C Authorized by: Theotis Cameron HERO, PA-C   Consent:    Consent obtained:  Verbal   Consent given by:  Patient   Risks discussed:  Bleeding   Alternatives discussed:  No treatment Universal protocol:    Procedure explained and questions answered to patient or proxy's satisfaction: yes     Relevant documents present and verified: yes     Test results available : yes     Site/side marked: yes     Patient identity confirmed:  Verbally with patient and arm band Location:    Type:  Abscess   Location: Left groin. Pre-procedure details:    Skin preparation:  Povidone-iodine Sedation:    Sedation type:  None Anesthesia:    Anesthesia  method:  None Procedure type:    Complexity:  Simple Procedure details:    Ultrasound guidance: no     Needle aspiration: yes     Needle size:  18 G   Wound management:  Irrigated with saline   Drainage:  Purulent and bloody   Drainage amount:  Scant   Wound treatment:  Wound left open   Packing materials:  None Post-procedure details:    Procedure completion:  Tolerated well, no immediate complications    Medications Ordered in the ED - No data to display  Clinical Course as of 12/08/23 1338  Thu Dec 08, 2023  1250 Resp panel by RT-PCR (RSV, Flu A&B, Covid) Anterior Nasal Swab Negative.  [CF]    Clinical Course User Index [CF] Theotis Cameron HERO, PA-C    Medical Decision Making Gabriel Ball is a 53 y.o. male patient who presents to the emergency department today for further evaluation of an abscess and cough.  Low suspicion for STDs.  In monogamous relationship.  Seems to be like an infected hair in the left groin consistent with large pustule.  This was drained via incision and drainage.  Please see procedure note for the detail.  The guards the patient's cough.  Low suspicion for pneumonia.  Will have him do warm compresses for abscess.  He will follow-up with his primary care doctor.  Strict return precautions were discussed.  He is safe for discharge.   Amount and/or Complexity of Data Reviewed Labs:  Decision-making details documented in ED Course. Radiology: ordered.  Risk Prescription drug management.     Final diagnoses:  Acute cough  Abscess    ED Discharge Orders          Ordered    cephALEXin  (KEFLEX ) 500 MG capsule  4 times daily,   Status:  Discontinued        12/08/23 1328    cephALEXin  (KEFLEX ) 500 MG capsule  4 times daily        12/08/23 1328               Theotis Cameron [place], [place] 12/08/23 1338    Emil Share, DO 12/08/23 1435

## 2023-12-08 NOTE — Telephone Encounter (Signed)
 Attempt made to reach patient, phone kept ringing, unable to leave voicemail.   Copied from CRM #[id number]. Topic: Clinical - Medication Question >> Dec 08, 2023  3:23 PM Gabriel Ball wrote: Reason for CRM: Patient went to the hospital today and was prescribed cephalexin  (Keflex ) 500 mg capsule. Patient has questions regarding the medication. Patient is requesting a callback at [phone]

## 2023-12-09 ENCOUNTER — Other Ambulatory Visit: Payer: Self-pay

## 2023-12-09 NOTE — Telephone Encounter (Signed)
 Call to patient  with interpreter. Patient asked how I should take Keflex . Advised. Take 1 capsule (500 mg total) by mouth 4 (four) times daily. Patient voiced understanding.

## 2023-12-28 ENCOUNTER — Other Ambulatory Visit: Payer: Self-pay | Admitting: Nurse Practitioner

## 2023-12-28 ENCOUNTER — Telehealth: Payer: Self-pay | Admitting: Nurse Practitioner

## 2023-12-28 ENCOUNTER — Other Ambulatory Visit: Payer: Self-pay

## 2023-12-28 NOTE — Telephone Encounter (Unsigned)
 Copied from CRM #[id number]. Topic: Clinical - Medication Refill >> Dec 28, 2023 10:27 AM Emylou G wrote: Medication: valACYclovir  (VALTREX ) 1000 MG tablet  Has the patient contacted their pharmacy? No (Agent: If no, request that the patient contact the pharmacy for the refill. If patient does not wish to contact the pharmacy document the reason why and proceed with request.) (Agent: If yes, when and what did the pharmacy advise?)  This is the patient's preferred pharmacy:  [facility] [address], [address] [place] [address] Phone: [phone] Fax: [phone]  Is this the correct pharmacy for this prescription? Yes If no, delete pharmacy and type the correct one.   Has the prescription been filled recently? No  Is the patient out of the medication? Yes  Has the patient been seen for an appointment in the last year OR does the patient have an upcoming appointment? No  Can we respond through MyChart? No  Agent: Please be advised that Rx refills may take up to 3 business days. We ask that you follow-up with your pharmacy.

## 2024-01-02 ENCOUNTER — Other Ambulatory Visit: Payer: Self-pay

## 2024-01-04 ENCOUNTER — Telehealth: Payer: Self-pay | Admitting: Nurse Practitioner

## 2024-01-04 NOTE — Telephone Encounter (Signed)
 Pt confirmed appt 01/04/2024

## 2024-01-05 ENCOUNTER — Other Ambulatory Visit: Payer: Self-pay

## 2024-01-05 ENCOUNTER — Ambulatory Visit: Payer: Self-pay | Attending: Family Medicine | Admitting: Family Medicine

## 2024-01-05 ENCOUNTER — Telehealth: Payer: Self-pay

## 2024-01-05 ENCOUNTER — Encounter: Payer: Self-pay | Admitting: Family Medicine

## 2024-01-05 VITALS — BP 102/62 | HR 72 | Temp 98.5°F | Ht 65.0 in | Wt 191.4 lb

## 2024-01-05 DIAGNOSIS — Z8619 Personal history of other infectious and parasitic diseases: Secondary | ICD-10-CM

## 2024-01-05 DIAGNOSIS — K219 Gastro-esophageal reflux disease without esophagitis: Secondary | ICD-10-CM

## 2024-01-05 DIAGNOSIS — R053 Chronic cough: Secondary | ICD-10-CM

## 2024-01-05 MED ORDER — VALACYCLOVIR HCL 1 G PO TABS
1000.0000 mg | ORAL_TABLET | Freq: Every day | ORAL | 0 refills | Status: DC
  Filled 2024-01-05: qty 5, 5d supply, fill #0

## 2024-01-05 MED ORDER — FLUTICASONE PROPIONATE 50 MCG/ACT NA SUSP
1.0000 | Freq: Every day | NASAL | 1 refills | Status: DC
  Filled 2024-01-05: qty 16, 30d supply, fill #0

## 2024-01-05 MED ORDER — LEVOCETIRIZINE DIHYDROCHLORIDE 5 MG PO TABS
5.0000 mg | ORAL_TABLET | Freq: Every evening | ORAL | 1 refills | Status: DC
  Filled 2024-01-05: qty 30, 30d supply, fill #0

## 2024-01-05 MED ORDER — POLYETHYLENE GLYCOL 3350 17 GM/SCOOP PO POWD
17.0000 g | Freq: Every day | ORAL | 6 refills | Status: AC
  Filled 2024-01-05: qty 510, 30d supply, fill #0
  Filled 2024-01-05 – 2024-01-09 (×2): qty 476, 28d supply, fill #0

## 2024-01-05 MED ORDER — OMEPRAZOLE 40 MG PO CPDR
40.0000 mg | DELAYED_RELEASE_CAPSULE | Freq: Every day | ORAL | 1 refills | Status: DC
  Filled 2024-01-05: qty 30, 30d supply, fill #0

## 2024-01-05 NOTE — Patient Instructions (Signed)
 ERGE en adultos: qu debe saber GERD in Adults: What to Know  El reflujo gastroesofgico (RGE) es cuando el cido del estmago sube al esfago. El esfago es la parte del cuerpo que transporta los alimentos desde la boca al [place]. Normalmente, los alimentos bajan y Building services engineer en el estmago para ser digeridos. Pero con RGE, los alimentos y el cido [address] pueden volver a subir. Usted puede tener una enfermedad llamada enfermedad de reflujo gastroesofgico (ERGE) si el reflujo: Sucede a menudo. Le causa sntomas muy intensos. Hace que el esfago est sensible e hinchado. Con el tiempo, la ERGE puede ocasionar pequeos agujeros, llamado lceras, en el revestimiento del esfago. Cules son las causas? La ERGE se debe a un problema en el msculo que se encuentra entre el esfago y [place]. Este msculo se conoce como esfnter esofgico inferior (EEI). Cuando est dbil o no es normal, no se cierra como debera. Esto significa que los alimentos y el cido estomacal pueden subir al esfago. Este msculo puede estar dbil si usted: Fuma o consume productos que contienen tabaco. Est embarazada. Tiene un tipo de hernia que se llama hernia de hiato. Consume ciertos alimentos y bebidas. Esto incluye lo siguiente: Alcohol. Caf. Chocolate. Cebollas. Menta. Qu incrementa el riesgo? Tener sobrepeso. Tener una enfermedad que afecta el tejido conjuntivo. Tomar antiinflamatorios no esteroideos (AINE) como el ibuprofeno. Cules son los signos o sntomas? Acidez estomacal. Dificultad para tragar. Dolor al tragar. Sensacin de Warehouse manager un bulto en la garganta. Un sabor amargo en la boca. Mal aliento. Estmago inflamado o con Dentist. Eructos. Dolor en el pecho. Otras afecciones tambin Armed forces technical officer. Es importante que consulte al mdico si siente dolor en el pecho. Sibilancias. Es la emisin de sonidos de silbidos agudos al respirar, ms a menudo al [employer]. Neomia Dear tos a  largo plazo o tos nocturna. Cmo se diagnostica? La ERGE se puede diagnosticar en funcin de los antecedentes mdicos y de un examen fsico. Tambin pueden hacerle pruebas. Pueden incluir: Una endoscopia. Esta prueba se hace para observar el estmago y el esfago con una cmara muy pequea. Una prueba de deglucin de bario. Esta prueba se hace para observar la forma y el tamao del esfago y determinar si est funcionando bien. Estudios del esfago para revisar lo siguiente: Niveles de cido. Presin. Cmo se trata? El tratamiento puede depender de la intensidad de los sntomas. Puede incluir lo siguiente: Cambios en su dieta y vida cotidiana. Medicamentos. Cipriano Mile. Siga estas instrucciones en su casa: Comida y bebida Siga un plan de alimentacin como se lo haya indicado el mdico. Es posible que deba evitar ciertos alimentos y bebidas. Pueden incluir: Caf y t negro, con o sin cafena. Alcohol. Bebidas energticas y deportivas. Bebidas gaseosas o refrescos. Chocolate y cacao. Menta y esencia de [place]. Ajo y cebolla. Rbano picante. Alimentos cidos y condimentados. Esto incluye lo siguiente: Pimientos. [place] en polvo y curry en polvo. Vinagre. Salsas picantes y [employer]. Ctricos y jugos. Esto incluye lo siguiente: Naranjas. Limones. Limas. Alimentos que [employer]. Esto incluye lo siguiente: Salsa roja y pizza con salsa roja. [place]. Salsa. Alimentos fritos y Lexicographer. Esto incluye lo siguiente: [place]. Papas fritas. Papas fritas en bolsa. Aderezos con alto contenido de Holiday representative. Carnes con alto contenido de [place] Esto incluye lo siguiente: Perros calientes y salchichas. Chuletas. Jamn y tocino. Productos lcteos ricos en grasas. Esto incluye lo siguiente: Leche entera. Mantequilla. Queso crema. Consuma pequeas cantidades de comida con ms frecuencia. No consuma grandes  cantidades de comida. Evite beber mucho lquido con las comidas. Intente  no comer durante las 2 o 3 horas previas a acostarse. Trate de no acostarse inmediatamente despus de comer. No haga ejercicios justo despus de comer. Estilo de vida  Si tiene sobrepeso, baje una cantidad de peso que sea saludable para usted. Consulte a su mdico para bajar de peso de [employer]. No fume, vapee ni consuma nicotina o tabaco. Use ropa holgada. No use nada apretado alrededor de la cintura. A la hora de dormir, pruebe lo siguiente: Levante la cabecera de la cama aproximadamente 6 pulgadas (15 cm). Para hacerlo puede usar una cua. Acustese del lado izquierdo. Intente reducir [employer] de estrs. Si necesita ayuda para lograrlo, consulte a su mdico. Instrucciones generales Tome sus medicamentos nicamente segn las indicaciones. No tome aspirina ni ibuprofeno a menos que se lo indiquen. Controle si hay algn cambio en sus sntomas. No se incline hacia adelante si eso empeora sus sntomas. Comunquese con un mdico si: Aparecen nuevos sntomas. Tiene dificultad para hacer lo siguiente: Beber. Tragar. Comer. Siente dolor al tragar. Tiene sibilancias. Tiene tos que no desaparece. Tiene ronquera. Los sntomas no mejoran con Scientist, research (medical). Solicite ayuda de inmediato si: Siente un dolor repentino en estas partes del cuerpo: El brazo. El cuello. La mandbula. Los dientes. La espalda. De repente se siente transpirado, mareado o aturdido. Se desmaya. Siente falta de aire o Journalist, newspaper. Vomita, y el vmito: Es verde, amarillo o negro. Parece tener sangre o borra de caf. Defeca y las heces son rojas, sanguinolentas o negras. Estos sntomas pueden Customer service manager. Llame al 911 de inmediato. No espere a ver si los sntomas desaparecen. No conduzca por sus propios medios [employer]. Esta informacin no tiene Theme park manager el consejo del mdico. Asegrese de hacerle al mdico cualquier pregunta que tenga. Document Revised: 07/19/2022  Document Reviewed: 07/19/2022 Elsevier Patient Education  2024 [employer].

## 2024-01-05 NOTE — Telephone Encounter (Signed)
 Patient came in for an appointment for medication refills with Dr. Newlin. Patient picked up medications from pharmacy and stated that this prescription valACYclovir  (VALTREX ) 1000 MG tablet  was sent in wrong, that usually PCP sends more than 5 pills and additional refills. I asked Alycia F. CMA if the prescription could be rewritten per patient request, she stated patient would have to see PCP in person if he hasn't seen her in the past 6 months. Appointment scheduled for 01/08/2025 9:50 AM. Please advise if changes need to be made!  Thank you.

## 2024-01-05 NOTE — Progress Notes (Signed)
 Subjective:  Patient ID: Gabriel Ball, male    DOB: 08/25/1970  Age: 53 y.o. MRN: [id number]  CC: Medical Management of Chronic Issues (Refill medication /Cough x2 month. Medication not working.)     Discussed the use of AI scribe software for clinical note transcription with the patient, who gave verbal consent to proceed.  History of Present Illness Gabriel Ball is a 53 year old male who presents with a chronic cough lasting two months.  He has had a dry cough for two months, present during the day and improving at night when lying down. There is no mucus production, wheezing, shortness of breath, or postnasal drip. He occasionally has chest tightness and was evaluated in the ER one month ago, where a chest x-ray was normal. He was treated with antibiotics for an abscess at that time, but the cough did not improve. He is using an inhaler but no other cough medications.  He does not use tobacco, alcohol, or recreational drugs. He occasionally uses a nasal spray for congestion but not daily.  He sometimes has acid reflux and is not taking any medication for it.  He takes valacyclovir  one gram daily during herpes flare-ups only and not as a daily medication.    Past Medical History:  Diagnosis Date   Anxiety    Asthma    GERD (gastroesophageal reflux disease)    Herpes    Shingles    Umbilical hernia     Past Surgical History:  Procedure Laterality Date   MOUTH SURGERY      Family History  Problem Relation Age of Onset   Cancer Father     Social History   Socioeconomic History   Marital status: Significant Other    Spouse name: Not on file   Number of children: Not on file   Years of education: Not on file   Highest education level: Not on file  Occupational History   Not on file  Tobacco Use   Smoking status: Former    Passive exposure: Past   Smokeless tobacco: Never  Vaping Use   Vaping status: Never Used  Substance and Sexual Activity    Alcohol use: No   Drug use: No   Sexual activity: Yes  Other Topics Concern   Not on file  Social History Narrative   Single and lives with daughter and significant other.    Social Drivers of Health   Tobacco Use: Medium Risk (12/08/2023)   Patient History    Smoking Tobacco Use: Former    Smokeless Tobacco Use: Never    Passive Exposure: Past  Programmer, Applications: Not on Ship Broker Insecurity: Not on file  Transportation Needs: Not on file  Physical Activity: Not on file  Stress: Not on file  Social Connections: Not on file  Depression (PHQ2-9): Low Risk (01/31/2023)   Depression (PHQ2-9)    PHQ-2 Score: 0  Alcohol Screen: Not on file  Housing: Not on file  Utilities: Not on file  Health Literacy: Not on file    Allergies[1]  Outpatient Medications Prior to Visit  Medication Sig Dispense Refill   albuterol  (VENTOLIN  HFA) 108 (90 Base) MCG/ACT inhaler Inhale 1-2 puffs into the lungs every 6 (six) hours as needed for wheezing or shortness of breath. 6.7 g 1   cephALEXin  (KEFLEX ) 500 MG capsule Take 1 capsule (500 mg total) by mouth 4 (four) times daily. (Patient not taking: Reported on 01/05/2024) 20 capsule 0   cephALEXin  (KEFLEX )  500 MG capsule Take 1 capsule (500 mg total) by mouth 4 (four) times daily. (Patient not taking: Reported on 01/05/2024) 20 capsule 0   Clobetasol  Prop Emollient Base 0.05 % emollient cream Apply 2 Applications topically 2 (two) times daily. (Patient not taking: Reported on 01/05/2024) 30 g 0   Clobetasol  Prop Emollient Base 0.05 % emollient cream Apply topically 2 (two) times daily. (Patient not taking: Reported on 01/05/2024) 30 g 0   hydrOXYzine  (VISTARIL ) 25 MG capsule Take 1 capsule (25 mg total) by mouth at bedtime as needed (sleep). For anxiety (Patient not taking: Reported on 01/05/2024) 60 capsule 0   Lidocaine , Anorectal, 5 % CREA APPLY RECTALLY AS NEEDED (Patient not taking: Reported on 01/05/2024) 85.05 g 3    neomycin -polymyxin-dexameth (MAXITROL ) 0.1 % OINT Apply a small amount in the conjunctival sac into the affected eye at bedtime. (Patient not taking: Reported on 01/05/2024) 3.5 g 0   Olopatadine  HCl (PATADAY ) 0.2 % SOLN Apply 1 drop to eye daily. (Patient not taking: Reported on 01/05/2024) 2.5 mL 1   triamcinolone  cream (KENALOG ) 0.1 % Apply 1 Application topically 2 (two) times daily to affected area on arms until better. (Patient not taking: Reported on 01/05/2024) 80 g 0   cetirizine  (ZYRTEC  ALLERGY) 10 MG tablet Take 1 tablet (10 mg total) by mouth at bedtime. (Patient not taking: Reported on 01/05/2024) 90 tablet 0   fluticasone  (FLONASE ) 50 MCG/ACT nasal spray Place 1 spray into both nostrils daily. Begin by using 2 sprays in each nare daily for 3 to 5 days, then decrease to 1 spray in each nare daily. (Patient not taking: Reported on 01/05/2024) 16 g 2   polyethylene glycol powder (GLYCOLAX /MIRALAX ) 17 GM/SCOOP powder MIX 17 GRAMS IN 4 TO 8 OUNCES OF WATER/JUICE AND TAKE BY MOUTH TWO TIMES DAILY AS NEEDED. (Patient not taking: Reported on 01/05/2024) 510 g 6   valACYclovir  (VALTREX ) 1000 MG tablet Take 1 tablet (1,000 mg total) by mouth daily. (Patient not taking: Reported on 01/05/2024) 3 tablet 0   No facility-administered medications prior to visit.     ROS Review of Systems  Constitutional:  Negative for activity change and appetite change.  HENT:  Negative for sinus pressure and sore throat.   Respiratory:  Positive for cough. Negative for chest tightness, shortness of breath and wheezing.   Cardiovascular:  Negative for chest pain and palpitations.  Gastrointestinal:  Negative for abdominal distention, abdominal pain and constipation.  Genitourinary: Negative.   Musculoskeletal: Negative.   Psychiatric/Behavioral:  Negative for behavioral problems and dysphoric mood.     Objective:  BP 102/62   Pulse 72   Temp 98.5 F (36.9 C) (Oral)   Ht 5' 5 (1.651 m)   Wt 191 lb 6.4  oz (86.8 kg)   SpO2 98%   BMI 31.85 kg/m      01/05/2024   11:24 AM 12/08/2023   10:51 AM 12/02/2023   10:30 AM  BP/Weight  Systolic BP 102 127 114  Diastolic BP 62 88 74  Wt. (Lbs) 191.4    BMI 31.85 kg/m2        Physical Exam Constitutional:      Appearance: He is well-developed.  HENT:     Mouth/Throat:     Mouth: Mucous membranes are moist.  Cardiovascular:     Rate and Rhythm: Normal rate.     Heart sounds: Normal heart sounds. No murmur heard. Pulmonary:     Effort: Pulmonary effort is normal.  Breath sounds: Normal breath sounds. No wheezing or rales.  Chest:     Chest wall: No tenderness.  Abdominal:     General: Bowel sounds are normal. There is no distension.     Palpations: Abdomen is soft. There is no mass.     Tenderness: There is no abdominal tenderness.  Musculoskeletal:        General: Normal range of motion.     Right lower leg: No edema.     Left lower leg: No edema.  Neurological:     Mental Status: He is alert and oriented to person, place, and time.  Psychiatric:        Mood and Affect: Mood normal.        Latest Ref Rng & Units 01/24/2023   10:00 AM 01/18/2023    8:37 AM 08/09/2022    3:51 PM  CMP  Glucose 70 - 99 mg/dL 895  90  81   BUN 6 - 20 mg/dL 9  11  11    Creatinine 0.61 - 1.24 mg/dL 9.17  9.18  9.20   Sodium 135 - 145 mmol/L 137  141  137   Potassium 3.5 - 5.1 mmol/L 3.5  4.2  4.1   Chloride 98 - 111 mmol/L 105  105  105   CO2 22 - 32 mmol/L 24  21  22    Calcium 8.9 - 10.3 mg/dL 9.1  9.3  9.0   Total Protein 6.5 - 8.1 g/dL 7.5  6.8  7.7   Total Bilirubin 0.0 - 1.2 mg/dL 0.8  0.5  0.9   Alkaline Phos 38 - 126 U/L 71  93  78   AST 15 - 41 U/L 27  27  31    ALT 0 - 44 U/L 29  35  27     Lipid Panel     Component Value Date/Time   CHOL 200 (H) 01/18/2023 0837   TRIG 148 01/18/2023 0837   HDL 28 (L) 01/18/2023 0837   CHOLHDL 7.1 (H) 01/18/2023 0837   LDLCALC 145 (H) 01/18/2023 0837    CBC    Component Value  Date/Time   WBC 6.0 01/24/2023 1000   RBC 5.32 01/24/2023 1000   HGB 15.9 01/24/2023 1000   HGB 15.8 01/18/2023 0837   HCT 45.3 01/24/2023 1000   HCT 47.0 01/18/2023 0837   PLT 231 01/24/2023 1000   PLT 226 01/18/2023 0837   MCV 85.2 01/24/2023 1000   MCV 87 01/18/2023 0837   MCH 29.9 01/24/2023 1000   MCHC 35.1 01/24/2023 1000   RDW 13.4 01/24/2023 1000   RDW 13.7 01/18/2023 0837   LYMPHSABS 2.1 01/18/2023 0837   MONOABS 0.4 08/09/2022 1551   EOSABS 0.2 01/18/2023 0837   BASOSABS 0.0 01/18/2023 0837    Lab Results  Component Value Date   HGBA1C 5.4 09/11/2021       Assessment & Plan Chronic cough Persisting for two months, dry, worse during the day. Differential includes allergies and gastroesophageal reflux disease. - Prescribed medication for acid reflux. - Prescribed xyzal , Flonase  for allergies. - Advised to avoid lying down for at least two hours after eating. - Consider chest CT scan if symptoms persist.  Gastroesophageal reflux disease Occasional symptoms may contribute to chronic cough. - Prescribed medication for acid reflux. - Advised to avoid lying down for at least two hours after eating.  History of Herpes simplex virus infection Managed with valacyclovir  as needed for flare-ups. He prefers not to take medication daily. -  Prescribed valacyclovir  1 gram capsule for use during flare-ups, one capsule daily for five days.       Meds ordered this encounter  Medications   fluticasone  (FLONASE ) 50 MCG/ACT nasal spray    Sig: Place 1 spray into both nostrils daily. Begin by using 2 sprays in each nare daily for 3 to 5 days, then decrease to 1 spray in each nare daily.    Dispense:  16 g    Refill:  1   omeprazole  (PRILOSEC) 40 MG capsule    Sig: Take 1 capsule (40 mg total) by mouth daily.    Dispense:  30 capsule    Refill:  1   levocetirizine (XYZAL  ALLERGY 24HR) 5 MG tablet    Sig: Take 1 tablet (5 mg total) by mouth every evening.    Dispense:   30 tablet    Refill:  1   polyethylene glycol powder (GLYCOLAX /MIRALAX ) 17 GM/SCOOP powder    Sig: MIx 17 grams (1 capful) in 4- 8 ounces of liquid once daily.    Dispense:  510 g    Refill:  6   valACYclovir  (VALTREX ) 1000 MG tablet    Sig: Take 1 tablet (1,000 mg total) by mouth daily.    Dispense:  5 tablet    Refill:  0    Follow-up: Return for Medical conditions with PCP.       Corrina Sabin, MD, FAAFP. [facility] [place], [place] [phone]   01/05/2024, 12:17 PM    [1]  Allergies Allergen Reactions   Amoxicillin  Itching    Pruritic rash   Cyclobenzaprine  Other (See Comments)    Facial Numbness/dizziness/hypersomnolence   Ibuprofen  Hives   Methocarbamol  Other (See Comments)    Abdominal pain   Penicillins Palpitations    Has patient had a PCN reaction causing immediate rash, facial/tongue/throat swelling, SOB or lightheadedness with hypotension: No Has patient had a PCN reaction causing severe rash involving mucus membranes or skin necrosis: No Has patient had a PCN reaction that required hospitalization: No Has patient had a PCN reaction occurring within the last 10 years: No If all of the above answers are NO, then may proceed with Cephalosporin use.

## 2024-01-06 ENCOUNTER — Other Ambulatory Visit: Payer: Self-pay

## 2024-01-06 ENCOUNTER — Other Ambulatory Visit: Payer: Self-pay | Admitting: Nurse Practitioner

## 2024-01-06 MED ORDER — VALACYCLOVIR HCL 1 G PO TABS
1000.0000 mg | ORAL_TABLET | Freq: Every day | ORAL | 3 refills | Status: AC
  Filled 2024-01-06 – 2024-01-09 (×2): qty 90, 90d supply, fill #0

## 2024-01-06 NOTE — Telephone Encounter (Signed)
 Noted

## 2024-01-06 NOTE — Telephone Encounter (Signed)
 Corrected prescription sent to pharmacy. He does not need to come in on Monday and be charged for a visit when he was just seen.

## 2024-01-09 ENCOUNTER — Other Ambulatory Visit: Payer: Self-pay

## 2024-01-09 ENCOUNTER — Ambulatory Visit: Payer: Self-pay | Admitting: Nurse Practitioner

## 2024-01-10 ENCOUNTER — Other Ambulatory Visit: Payer: Self-pay

## 2024-01-20 ENCOUNTER — Encounter (HOSPITAL_COMMUNITY): Payer: Self-pay

## 2024-01-20 ENCOUNTER — Ambulatory Visit (HOSPITAL_COMMUNITY)
Admission: EM | Admit: 2024-01-20 | Discharge: 2024-01-20 | Disposition: A | Discharge status: Home / Self Care | Payer: Self-pay | Attending: Physician Assistant | Admitting: Physician Assistant

## 2024-01-20 ENCOUNTER — Ambulatory Visit (INDEPENDENT_AMBULATORY_CARE_PROVIDER_SITE_OTHER): Payer: Self-pay

## 2024-01-20 ENCOUNTER — Ambulatory Visit (HOSPITAL_COMMUNITY): Payer: Self-pay | Admitting: Physician Assistant

## 2024-01-20 DIAGNOSIS — M940 Chondrocostal junction syndrome [Tietze]: Secondary | ICD-10-CM

## 2024-01-20 DIAGNOSIS — R0789 Other chest pain: Secondary | ICD-10-CM

## 2024-01-20 MED ORDER — PREDNISONE 20 MG PO TABS: 40.0000 mg | ORAL_TABLET | Freq: Every day | ORAL | 0 refills | Status: AC

## 2024-01-20 MED ORDER — BACLOFEN 5 MG PO TABS: 5.0000 mg | ORAL_TABLET | Freq: Every evening | ORAL | 0 refills | Status: AC | PRN

## 2024-01-20 NOTE — ED Triage Notes (Signed)
 Per Spanish Interpreter-Abril #[id number]  Patient states he is having upper back pain x 5 days.Patient denies heavy lifting or a back injury.  Patient states he has had a non productive cough x 2 months. Patient states he has chest pain when he breathes.   Patient added that he has had body aches x 5 days as well.   Patient denies taking any medication for his symptoms.

## 2024-01-20 NOTE — ED Provider Notes (Signed)
 " [facility]    CSN: [id number] Arrival date & time: 01/20/24  1519      History   Chief Complaint No chief complaint on file.   [name] is a 54 y.o. male.   Patient presents today companied by his daughter.  He speaks Spanish and video interpreter was utilized during visit.  Reports that for the past 5 days he has had pain in his anterior chest wall and cervical/thoracic back.  He reports that this does not radiate through his chest and is more on the outside of his chest wall.  The pain is rated 10 on a 0-10 pain scale, described as soreness, no alleviating factors identified.  Denies any fever, nausea, vomiting, chest pain, palpitations, lightheadedness, near syncope.  He has not tried any over-the-counter medication for symptom management.  He denies any recent fall or trauma involving his chest or back.  He has had a chronic cough and was seen by his primary care in November at which point he had a negative chest x-ray and was started on allergy medication and PPI which has provided improvement of symptoms.  Reports that the cough has improved and is not particular bothersome now.  He denies any associated rash.  He is having difficulty with daily tibias as bending over to lift items causes significant pain.    Past Medical History:  Diagnosis Date   Anxiety    Asthma    GERD (gastroesophageal reflux disease)    Herpes    Shingles    Umbilical hernia     Patient Active Problem List   Diagnosis Date Noted   Acute bilateral low back pain without sciatica 06/05/2020   Acute pain of right shoulder 06/05/2020   Asthma due to environmental allergies 12/20/2016   HSV (herpes simplex virus) infection 02/24/2015   Anxiety 04/02/2014    Past Surgical History:  Procedure Laterality Date   MOUTH SURGERY         Home Medications    Prior to Admission medications  Medication Sig Start Date End Date Taking? Authorizing Provider  baclofen  5 MG TABS  Take 1 tablet (5 mg total) by mouth at bedtime as needed for muscle spasms. 01/20/24  Yes Jerzey Komperda K, PA-C  predniSONE  (DELTASONE ) 20 MG tablet Take 2 tablets (40 mg total) by mouth daily for 5 days. 01/20/24 01/25/24 Yes Murielle Stang K, PA-C  albuterol  (VENTOLIN  HFA) 108 (90 Base) MCG/ACT inhaler Inhale 1-2 puffs into the lungs every 6 (six) hours as needed for wheezing or shortness of breath. 12/02/23   Mecum, Stefano Trulson E, PA-C  fluticasone  (FLONASE ) 50 MCG/ACT nasal spray Place 1 spray into both nostrils daily. Begin by using 2 sprays in each nare daily for 3 to 5 days, then decrease to 1 spray in each nare daily. 01/05/24   Newlin, Enobong, MD  levocetirizine (XYZAL  ALLERGY 24HR) 5 MG tablet Take 1 tablet (5 mg total) by mouth every evening. 01/05/24   Newlin, Enobong, MD  omeprazole  (PRILOSEC) 40 MG capsule Take 1 capsule (40 mg total) by mouth daily. Patient not taking: Reported on 01/20/2024 01/05/24   Newlin, Enobong, MD  polyethylene glycol powder (GLYCOLAX /MIRALAX ) 17 GM/SCOOP powder MIx 17 grams (1 capful) in 4- 8 ounces of liquid once daily. 01/05/24   Newlin, Enobong, MD  valACYclovir  (VALTREX ) 1000 MG tablet Take 1 tablet (1,000 mg total) by mouth daily. 01/06/24   Fleming, Zelda W, NP  SUMAtriptan  (IMITREX ) 50 MG tablet Take one for headache.  May repeat in 2 hours.  No more than 2 a day 05/27/17 12/09/21  Maranda Jamee Jacob, MD    Family History Family History  Problem Relation Age of Onset   Cancer Father     Social History Social History[1]   Allergies   Amoxicillin , Cyclobenzaprine , Ibuprofen , Methocarbamol , and Penicillins   Review of Systems Review of Systems  Constitutional:  Positive for activity change. Negative for appetite change, fatigue and fever.  HENT:  Negative for congestion and sore throat.   Respiratory:  Positive for cough (improving). Negative for shortness of breath.   Cardiovascular:  Positive for chest pain. Negative for palpitations and leg swelling.   Gastrointestinal:  Negative for abdominal pain, diarrhea, nausea and vomiting.  Neurological:  Negative for dizziness, light-headedness and headaches.     Physical Exam Triage Vital Signs ED Triage Vitals [01/20/24 1752]  Encounter Vitals Group     BP 123/79     Girls Systolic BP Percentile      Girls Diastolic BP Percentile      Boys Systolic BP Percentile      Boys Diastolic BP Percentile      Pulse Rate 93     Resp 16     Temp 98.2 F (36.8 C)     Temp Source Oral     SpO2 97 %     Weight      Height      Head Circumference      Peak Flow      Pain Score      Pain Loc      Pain Education      Exclude from Growth Chart    No data found.  Updated Vital Signs BP 123/79 (BP Location: Right Arm)   Pulse 93   Temp 98.2 F (36.8 C) (Oral)   Resp 16   SpO2 97%   Visual Acuity Right Eye Distance:   Left Eye Distance:   Bilateral Distance:    Right Eye Near:   Left Eye Near:    Bilateral Near:     Physical Exam Vitals reviewed.  Constitutional:      General: He is awake.     Appearance: Normal appearance. He is well-developed. He is not ill-appearing.     Comments: Very pleasant male appears stated age in no acute distress sitting comfortably in exam room  HENT:     Head: Normocephalic and atraumatic.  Cardiovascular:     Rate and Rhythm: Normal rate and regular rhythm.     Heart sounds: Normal heart sounds, S1 normal and S2 normal. No murmur heard. Pulmonary:     Effort: Pulmonary effort is normal.     Breath sounds: Normal breath sounds. No stridor. No wheezing, rhonchi or rales.     Comments: Clear to auscultation bilaterally Chest:     Chest wall: Tenderness present. No deformity or swelling.     Comments: Tenderness to palpation of anterior chest wall; pain is reproducible on exam. Musculoskeletal:     Cervical back: Tenderness present. No bony tenderness.     Thoracic back: Tenderness present. No bony tenderness.     Lumbar back: No tenderness or  bony tenderness.     Comments: Back: Tenderness to palpation of cervical and thoracic paraspinal muscles.  No pain percussion of vertebrae.  No deformity or step-off noted.  Normal active range of motion of cervical and thoracic spine.  Strength 5/5 bilateral upper extremities.  Neurological:     Mental Status: He  is alert.  Psychiatric:        Behavior: Behavior is cooperative.      UC Treatments / Results  Labs (all labs ordered are listed, but only abnormal results are displayed) Labs Reviewed - No data to display  EKG   Radiology No results found.  Procedures Procedures (including critical care time)  Medications Ordered in UC Medications - No data to display  Initial Impression / Assessment and Plan / UC Course  I have reviewed the triage vital signs and the nursing notes.  Pertinent labs & imaging results that were available during my care of the patient were reviewed by me and considered in my medical decision making (see chart for details).     Patient is well-appearing, afebrile, nontoxic, nontachycardic.  Low suspicion for cardiac etiology as his pain is reproducible on exam with an INTERCHEST score of -1.  Chest x-ray was obtained that showed no acute cardiopulmonary disease based on my primary read.  At this time discharged reporting for radiologist over read and will contact him if this differs and changes our treatment plan.  I suspect symptoms are related to costochondritis given his clinical presentation and given his history of allergy to NSAIDs will start prednisone  burst.  Discussed that he is not to take NSAIDs with this medication but can use Tylenol /acetaminophen  for breakthrough pain.  Will also start a low-dose baclofen  (5 mg at night) for a few days for additional pain relief.  Notification for dose adjustment based on metabolic panel from 01/24/2023 with a creatinine of 0.82 and calculated creatinine clearance of 129 mL/min.  Discussed that if symptoms are  not improving he is to follow-up with primary care.  If he has any worsening symptoms including fever, recurrent cough, increasing chest pain, pain associated with activity, shortness of breath, lightheadedness, weakness needs to go to the ER.  Return precautions given.  Patient expressed understanding and agreement to treatment plan.  Entirety of visit was conducted utilizing Spanish interpreter.  Final Clinical Impressions(s) / UC Diagnoses   Final diagnoses:  Chest wall pain  Costochondritis     Discharge Instructions      I did not see anything on your chest x-ray that would indicate a pneumonia or other problem.  As we discussed, since we can push on your chest wall and cause pain, I am concerned that you have inflammation of the cartilage in your chest wall known as costochondritis.  Take prednisone  40 mg for 5 days.  Do not take NSAIDs with this medication including aspirin, ibuprofen /Advil , naproxen /Aleve .  You can use baclofen  at night.  This will make you sleepy so do not drive or drink alcohol while taking it.  Follow-up with your primary care next week if your symptoms do not go away.  If anything worsens and you have fever, increasing pain, shortness of breath, severe cough, weakness you need to be seen immediately.  No observ nada en su radiografa de trax que indicara neumona ni ningn otro problema. Como ya comentamos, dado que podemos presionar la pared torcica y publishing rights manager, me preocupa que tenga una inflamacin del cartlago de la pared torcica conocida como costocondritis. Tome 40 mg de prednisona durante [address]. No tome AINE con este medicamento, como aspirina, ibuprofeno/Advil  y naproxeno/Aleve . Puede usar baclofeno por la noche. Esto le producir somnolencia, as que no conduzca ni beba alcohol mientras lo est tomando. Consulte con su mdico de cabecera la prxima semana si los sntomas no desaparecen. Si algo empeora y  presenta fiebre, aumento del dolor, dificultad  para respirar, tos intensa o debilidad, debe ser examinado de inmediato.     ED Prescriptions     Medication Sig Dispense Auth. Provider   predniSONE  (DELTASONE ) 20 MG tablet Take 2 tablets (40 mg total) by mouth daily for 5 days. 10 tablet Nyana Haren K, PA-C   baclofen  5 MG TABS Take 1 tablet (5 mg total) by mouth at bedtime as needed for muscle spasms. 5 tablet Fonnie Crookshanks K, PA-C      PDMP not reviewed this encounter.     [1]  Social History Tobacco Use   Smoking status: Former    Passive exposure: Past   Smokeless tobacco: Never  Vaping Use   Vaping status: Never Used  Substance Use Topics   Alcohol use: No   Drug use: No     Sherrell Rocky POUR, PA-C 01/20/24 1932  "

## 2024-01-20 NOTE — Discharge Instructions (Signed)
 I did not see anything on your chest x-ray that would indicate a pneumonia or other problem.  As we discussed, since we can push on your chest wall and cause pain, I am concerned that you have inflammation of the cartilage in your chest wall known as costochondritis.  Take prednisone  40 mg for 5 days.  Do not take NSAIDs with this medication including aspirin, ibuprofen /Advil , naproxen /Aleve .  You can use baclofen  at night.  This will make you sleepy so do not drive or drink alcohol while taking it.  Follow-up with your primary care next week if your symptoms do not go away.  If anything worsens and you have fever, increasing pain, shortness of breath, severe cough, weakness you need to be seen immediately.  No observ nada en su radiografa de trax que indicara neumona ni ningn otro problema. Como ya comentamos, dado que podemos presionar la pared torcica y publishing rights manager, me preocupa que tenga una inflamacin del cartlago de la pared torcica conocida como costocondritis. Tome 40 mg de prednisona durante [address]. No tome AINE con este medicamento, como aspirina, ibuprofeno/Advil  y naproxeno/Aleve . Puede usar baclofeno por la noche. Esto le producir somnolencia, as que no conduzca ni beba alcohol mientras lo est tomando. Consulte con su mdico de cabecera la prxima semana si los sntomas no desaparecen. Si algo empeora y presenta fiebre, aumento del dolor, dificultad para respirar, tos intensa o debilidad, debe ser examinado de inmediato.

## 2024-01-22 ENCOUNTER — Ambulatory Visit (HOSPITAL_COMMUNITY)
Admission: EM | Admit: 2024-01-22 | Discharge: 2024-01-22 | Disposition: A | Discharge status: Home / Self Care | Payer: Self-pay

## 2024-01-22 ENCOUNTER — Encounter (HOSPITAL_COMMUNITY): Payer: Self-pay

## 2024-01-22 ENCOUNTER — Telehealth (HOSPITAL_COMMUNITY): Payer: Self-pay | Admitting: *Deleted

## 2024-01-22 DIAGNOSIS — H5789 Other specified disorders of eye and adnexa: Secondary | ICD-10-CM

## 2024-01-22 MED ORDER — AZELASTINE HCL 0.05 % OP SOLN: 1.0000 [drp] | Freq: Two times a day (BID) | OPHTHALMIC | 0 refills | Status: DC

## 2024-01-22 MED ORDER — AZELASTINE HCL 0.05 % OP SOLN
1.0000 [drp] | Freq: Two times a day (BID) | OPHTHALMIC | 0 refills | Status: AC
  Filled 2024-01-22: qty 6, 60d supply, fill #0

## 2024-01-22 NOTE — Discharge Instructions (Addendum)
 1. Eye irritation (Primary) - azelastine  (OPTIVAR ) 0.05 % ophthalmic solution; Place 1 drop into the right eye 2 (two) times daily.  Dispense: 6 mL; Refill: 0 - Continue to take prednisone  40 mg once daily in the morning with food to minimize any secondary side effects such as anxiety and muscle twitching. - You may take hydroxyzine  and baclofen  as directed in conjunction with other medications without any secondary concern for medication reaction.  Note that taking hydroxyzine  and baclofen  at the same time may increase your drowsiness and impairment.  Do not take these medications when you have to drive or work as it may cause severe drowsiness. - If eye symptoms do not improve with current medication follow-up with eye specialist for further evaluation and treatment.  -Continue to monitor symptoms for any change in severity if there is any escalation of current symptoms or development of new symptoms follow-up in ER for further evaluation and management.

## 2024-01-22 NOTE — ED Triage Notes (Signed)
 Spanish Interprer used for this visit.  Patient states his Right eye has been twitching and itching x 2 days.   Patient denies taking medication for his symptoms.

## 2024-01-22 NOTE — Telephone Encounter (Signed)
 Rx sent to [facility]

## 2024-01-22 NOTE — ED Provider Notes (Signed)
 " [facility] [place]  Note:  This document was prepared using Dragon voice recognition software and may include unintentional dictation errors.  MRN: [id number] DOB: 1970/04/09  Subjective:   Gabriel Ball is a 54 y.o. male presenting for evaluation of right eye twitching, itching, puffiness x 2 days.  Patient reports he has been using some over-the-counter eyedrops with minimal improvement.  Patient also states that he was seen 2 days ago and prescribed prednisone  for cough and chest congestion symptoms.  Patient was concerned that medication could be causing right eye symptoms.  Patient states he took medication yesterday in the morning in the evening as directed and was unable to get any sleep and was thinking that maybe lack of sleep could be causing his eyes to water and be puffy.  Patient seeking advice of how to take medications and if there will be any interactions between medications.  Current Medications[1]   Allergies[2]  Past Medical History:  Diagnosis Date   Anxiety    Asthma    GERD (gastroesophageal reflux disease)    Herpes    Shingles    Umbilical hernia      Past Surgical History:  Procedure Laterality Date   MOUTH SURGERY      Family History  Problem Relation Age of Onset   Cancer Father     Social History[3]  ROS Refer to HPI for ROS details.  Objective:    Vitals: BP 110/72 (BP Location: Left Arm)   Pulse 89   Temp 98.2 F (36.8 C) (Oral)   Resp 16   SpO2 96%   Physical Exam Vitals and nursing note reviewed.  Constitutional:      General: He is not in acute distress.    Appearance: Normal appearance. He is well-developed. He is not ill-appearing or toxic-appearing.  HENT:     Head: Normocephalic.     Nose: Nose normal.     Mouth/Throat:     Mouth: Mucous membranes are moist.     Pharynx: Oropharynx is clear.  Eyes:     General: Lids are normal. Lids are everted, no foreign bodies appreciated. Vision grossly intact. Gaze  aligned appropriately. No allergic shiner or visual field deficit.       Right eye: Discharge present.        Left eye: No discharge.     Extraocular Movements: Extraocular movements intact.     Right eye: Normal extraocular motion.     Left eye: Normal extraocular motion.     Conjunctiva/sclera:     Right eye: Right conjunctiva is injected. No exudate.    Left eye: Left conjunctiva is not injected. No exudate.    Pupils: Pupils are equal, round, and reactive to light.  Cardiovascular:     Rate and Rhythm: Normal rate.  Pulmonary:     Effort: Pulmonary effort is normal. No respiratory distress.  Skin:    General: Skin is warm and dry.  Neurological:     General: No focal deficit present.     Mental Status: He is alert and oriented to person, place, and time.  Psychiatric:        Mood and Affect: Mood normal.        Behavior: Behavior normal.     Procedures  No results found for this or any previous visit (from the past 24 hours).  Assessment and Plan :     Discharge Instructions      1. Eye irritation (Primary) - azelastine  (OPTIVAR ) 0.05 %  ophthalmic solution; Place 1 drop into the right eye 2 (two) times daily.  Dispense: 6 mL; Refill: 0 - Continue to take prednisone  40 mg once daily in the morning with food to minimize any secondary side effects such as anxiety and muscle twitching. - You may take hydroxyzine  and baclofen  as directed in conjunction with other medications without any secondary concern for medication reaction.  Note that taking hydroxyzine  and baclofen  at the same time may increase your drowsiness and impairment.  Do not take these medications when you have to drive or work as it may cause severe drowsiness. - If eye symptoms do not improve with current medication follow-up with eye specialist for further evaluation and treatment.  -Continue to monitor symptoms for any change in severity if there is any escalation of current symptoms or development of new  symptoms follow-up in ER for further evaluation and management.      Zykeria Laguardia B Reana Chacko    [1] No current facility-administered medications for this encounter.  Current Outpatient Medications:    azelastine  (OPTIVAR ) 0.05 % ophthalmic solution, Place 1 drop into the right eye 2 (two) times daily., Disp: 6 mL, Rfl: 0   baclofen  5 MG TABS, Take 1 tablet (5 mg total) by mouth at bedtime as needed for muscle spasms., Disp: 5 tablet, Rfl: 0   fluticasone  (FLONASE ) 50 MCG/ACT nasal spray, Place 1 spray into both nostrils daily. Begin by using 2 sprays in each nare daily for 3 to 5 days, then decrease to 1 spray in each nare daily., Disp: 16 g, Rfl: 1   levocetirizine (XYZAL  ALLERGY 24HR) 5 MG tablet, Take 1 tablet (5 mg total) by mouth every evening., Disp: 30 tablet, Rfl: 1   predniSONE  (DELTASONE ) 20 MG tablet, Take 2 tablets (40 mg total) by mouth daily for 5 days., Disp: 10 tablet, Rfl: 0   albuterol  (VENTOLIN  HFA) 108 (90 Base) MCG/ACT inhaler, Inhale 1-2 puffs into the lungs every 6 (six) hours as needed for wheezing or shortness of breath., Disp: 6.7 g, Rfl: 1   omeprazole  (PRILOSEC) 40 MG capsule, Take 1 capsule (40 mg total) by mouth daily. (Patient not taking: Reported on 01/20/2024), Disp: 30 capsule, Rfl: 1   polyethylene glycol powder (GLYCOLAX /MIRALAX ) 17 GM/SCOOP powder, MIx 17 grams (1 capful) in 4- 8 ounces of liquid once daily., Disp: 476 g, Rfl: 6   valACYclovir  (VALTREX ) 1000 MG tablet, Take 1 tablet (1,000 mg total) by mouth daily., Disp: 90 tablet, Rfl: 3 [2]  Allergies Allergen Reactions   Amoxicillin  Itching    Pruritic rash   Cyclobenzaprine  Other (See Comments)    Facial Numbness/dizziness/hypersomnolence   Ibuprofen  Hives   Methocarbamol  Other (See Comments)    Abdominal pain   Penicillins Palpitations    Has patient had a PCN reaction causing immediate rash, facial/tongue/throat swelling, SOB or lightheadedness with hypotension: No Has patient had a PCN reaction  causing severe rash involving mucus membranes or skin necrosis: No Has patient had a PCN reaction that required hospitalization: No Has patient had a PCN reaction occurring within the last 10 years: No If all of the above answers are NO, then may proceed with Cephalosporin use.  [3]  Social History Tobacco Use   Smoking status: Former    Passive exposure: Past   Smokeless tobacco: Never  Vaping Use   Vaping status: Never Used  Substance Use Topics   Alcohol use: No   Drug use: No     Aurea Goodell B, NP 01/22/24 1554  "

## 2024-01-23 ENCOUNTER — Emergency Department (HOSPITAL_COMMUNITY)
Admission: EM | Admit: 2024-01-23 | Discharge: 2024-01-23 | Disposition: A | Discharge status: Home / Self Care | Payer: Self-pay | Attending: Emergency Medicine | Admitting: Emergency Medicine

## 2024-01-23 ENCOUNTER — Emergency Department (HOSPITAL_COMMUNITY): Payer: Self-pay

## 2024-01-23 ENCOUNTER — Other Ambulatory Visit: Payer: Self-pay

## 2024-01-23 DIAGNOSIS — M546 Pain in thoracic spine: Secondary | ICD-10-CM | POA: Insufficient documentation

## 2024-01-23 DIAGNOSIS — E876 Hypokalemia: Secondary | ICD-10-CM | POA: Insufficient documentation

## 2024-01-23 DIAGNOSIS — J189 Pneumonia, unspecified organism: Secondary | ICD-10-CM | POA: Insufficient documentation

## 2024-01-23 LAB — CBC
HCT: 45.9 % (ref 39.0–52.0)
Hemoglobin: 16 g/dL (ref 13.0–17.0)
MCH: 29.8 pg (ref 26.0–34.0)
MCHC: 34.9 g/dL (ref 30.0–36.0)
MCV: 85.5 fL (ref 80.0–100.0)
Platelets: 236 K/uL (ref 150–400)
RBC: 5.37 MIL/uL (ref 4.22–5.81)
RDW: 13.4 % (ref 11.5–15.5)
WBC: 10.7 K/uL — ABNORMAL HIGH (ref 4.0–10.5)
nRBC: 0 % (ref 0.0–0.2)

## 2024-01-23 LAB — BASIC METABOLIC PANEL WITH GFR
Anion gap: 12 (ref 5–15)
BUN: 9 mg/dL (ref 6–20)
CO2: 25 mmol/L (ref 22–32)
Calcium: 9 mg/dL (ref 8.9–10.3)
Chloride: 105 mmol/L (ref 98–111)
Creatinine, Ser: 0.72 mg/dL (ref 0.61–1.24)
GFR, Estimated: >60 mL/min
Glucose, Bld: 148 mg/dL — ABNORMAL HIGH (ref 70–99)
Potassium: 3.1 mmol/L — ABNORMAL LOW (ref 3.5–5.1)
Sodium: 141 mmol/L (ref 135–145)

## 2024-01-23 LAB — TROPONIN T, HIGH SENSITIVITY: Troponin T High Sensitivity: <15 ng/L (ref 0–19)

## 2024-01-23 MED ORDER — IOHEXOL 350 MG/ML SOLN
75.0000 mL | Freq: Once | INTRAVENOUS | Status: AC | PRN
  Administered 2024-01-23: 75 mL via INTRAVENOUS

## 2024-01-23 MED ORDER — ACETAMINOPHEN 325 MG PO TABS
650.0000 mg | ORAL_TABLET | Freq: Once | ORAL | Status: DC
  Filled 2024-01-23: qty 2

## 2024-01-23 MED ORDER — AZITHROMYCIN 250 MG PO TABS: 250.0000 mg | ORAL_TABLET | Freq: Every day | ORAL | 0 refills | Status: AC

## 2024-01-23 MED ORDER — AZITHROMYCIN 250 MG PO TABS
500.0000 mg | ORAL_TABLET | Freq: Once | ORAL | Status: AC
  Administered 2024-01-23: 500 mg via ORAL
  Filled 2024-01-23: qty 2

## 2024-01-23 MED ORDER — POTASSIUM CHLORIDE CRYS ER 20 MEQ PO TBCR
20.0000 meq | EXTENDED_RELEASE_TABLET | Freq: Every day | ORAL | 0 refills | Status: AC
  Filled 2024-01-23: qty 7, 7d supply, fill #0

## 2024-01-23 MED ORDER — AZITHROMYCIN 250 MG PO TABS
ORAL_TABLET | ORAL | 0 refills | Status: DC
  Filled 2024-01-23: qty 4, 4d supply, fill #0
  Filled 2024-01-23: qty 6, 5d supply, fill #0

## 2024-01-23 MED ORDER — SODIUM CHLORIDE 0.9 % IV SOLN
1.0000 g | Freq: Once | INTRAVENOUS | Status: AC
  Administered 2024-01-23: 1 g via INTRAVENOUS
  Filled 2024-01-23: qty 10

## 2024-01-23 MED ORDER — POTASSIUM CHLORIDE CRYS ER 20 MEQ PO TBCR: 20.0000 meq | EXTENDED_RELEASE_TABLET | Freq: Every day | ORAL | 0 refills | Status: AC

## 2024-01-23 MED ORDER — POTASSIUM CHLORIDE CRYS ER 20 MEQ PO TBCR
40.0000 meq | EXTENDED_RELEASE_TABLET | Freq: Once | ORAL | Status: AC
  Administered 2024-01-23: 40 meq via ORAL
  Filled 2024-01-23: qty 2

## 2024-01-23 NOTE — ED Provider Notes (Signed)
 " [facility] [facility] Provider Note   CSN: [id number] Arrival date & time: 01/23/24  9155     Patient presents with: Chest Pain and Back Pain   Gabriel Ball is a 54 y.o. male, who is primarily Spanish-speaking, Spanish translator was used, presented to ED with about 1 week of chest pain, pleuritic pain, back pain, and cough.  Patient reports he has been seen in urgent care once or twice earlier this week.  He was started on prednisone  which he has been taking.  This medicine is making him feel jittery makes him feel like his right eye is spasming.  He continues to have pain, predominantly that he feels in his back, all across the back with deep inspiration, but also with movement.  He also has chest pain with coughing and deep inspiration.  He denies underlying history of smoking, alcohol use, polysubstance use, high blood pressure, diabetes.  He reports he is generally healthy.  He does report that he has had a chronic cough for many months or even years, for which he has been seen in urgent care several times in the past.  He does not have any numbness or weakness or tingling in his hands or feet.   HPI     Prior to Admission medications  Medication Sig Start Date End Date Taking? Authorizing Provider  azithromycin  (ZITHROMAX ) 250 MG tablet Take 1 tablet (250 mg total) by mouth daily for 4 days. Take first 2 tablets together, then 1 every day until finished. 01/24/24 01/28/24 Yes Ismail Graziani, Donnice PARAS, MD  potassium chloride  SA (KLOR-CON  M) 20 MEQ tablet Take 1 tablet (20 mEq total) by mouth daily. 01/24/24 01/31/24 Yes Tanashia Ciesla, Donnice PARAS, MD  albuterol  (VENTOLIN  HFA) [phone] Base) MCG/ACT inhaler Inhale 1-2 puffs into the lungs every 6 (six) hours as needed for wheezing or shortness of breath. 12/02/23   Mecum, Erin E, PA-C  azelastine  (OPTIVAR ) 0.05 % ophthalmic solution Place 1 drop into the right eye 2 (two) times daily. 01/22/24   Reddick, Johnathan B,  NP  baclofen  5 MG TABS Take 1 tablet (5 mg total) by mouth at bedtime as needed for muscle spasms. 01/20/24   Raspet, Erin K, PA-C  fluticasone  (FLONASE ) 50 MCG/ACT nasal spray Place 1 spray into both nostrils daily. Begin by using 2 sprays in each nare daily for 3 to 5 days, then decrease to 1 spray in each nare daily. 01/05/24   Newlin, Enobong, MD  levocetirizine (XYZAL  ALLERGY 24HR) 5 MG tablet Take 1 tablet (5 mg total) by mouth every evening. 01/05/24   Newlin, Enobong, MD  omeprazole  (PRILOSEC) 40 MG capsule Take 1 capsule (40 mg total) by mouth daily. Patient not taking: Reported on 01/20/2024 01/05/24   Newlin, Enobong, MD  polyethylene glycol powder (GLYCOLAX /MIRALAX ) 17 GM/SCOOP powder MIx 17 grams (1 capful) in 4- 8 ounces of liquid once daily. 01/05/24   Newlin, Enobong, MD  predniSONE  (DELTASONE ) 20 MG tablet Take 2 tablets (40 mg total) by mouth daily for 5 days. 01/20/24 01/25/24  Raspet, Erin K, PA-C  valACYclovir  (VALTREX ) 1000 MG tablet Take 1 tablet (1,000 mg total) by mouth daily. 01/06/24   Fleming, Zelda W, NP  SUMAtriptan  (IMITREX ) 50 MG tablet Take one for headache.  May repeat in 2 hours.  No more than 2 a day 05/27/17 12/09/21  Maranda Jamee Jacob, MD    Allergies: Amoxicillin , Cyclobenzaprine , Ibuprofen , Methocarbamol , and Penicillins    Review of Systems  Updated Vital Signs BP 110/68 (BP Location: Left Arm)   Pulse 85   Temp 98 F (36.7 C) (Oral)   Resp 20   Ht 5' 5 (1.651 m)   Wt 86.8 kg   SpO2 98%   BMI 31.84 kg/m   Physical Exam Constitutional:      General: He is not in acute distress. HENT:     Head: Normocephalic and atraumatic.  Eyes:     Conjunctiva/sclera: Conjunctivae normal.     Pupils: Pupils are equal, round, and reactive to light.  Cardiovascular:     Rate and Rhythm: Normal rate and regular rhythm.  Pulmonary:     Effort: Pulmonary effort is normal. No respiratory distress.  Abdominal:     General: There is no distension.     Tenderness:  There is no abdominal tenderness.  Musculoskeletal:     Comments: Back pain is reproducible with axial loading onto the spine, predominantly in the upper thoracic region, mid spinal as well as paraspinal  Skin:    General: Skin is warm and dry.  Neurological:     General: No focal deficit present.     Mental Status: He is alert. Mental status is at baseline.  Psychiatric:        Mood and Affect: Mood normal.        Behavior: Behavior normal.     (all labs ordered are listed, but only abnormal results are displayed) Labs Reviewed  BASIC METABOLIC PANEL WITH GFR - Abnormal; Notable for the following components:      Result Value   Potassium 3.1 (*)    Glucose, Bld 148 (*)    All other components within normal limits  CBC - Abnormal; Notable for the following components:   WBC 10.7 (*)    All other components within normal limits  TROPONIN T, HIGH SENSITIVITY    EKG: EKG Interpretation Date/Time:  Monday January 23 2024 08:51:11 EST Ventricular Rate:  92 PR Interval:  148 QRS Duration:  102 QT Interval:  355 QTC Calculation: 440 R Axis:   50  Text Interpretation: Sinus rhythm Minimal ST depression, inferior leads No significant changes from prior tracing Mar 28 2023 Confirmed by Cottie Cough [id number]) on 01/23/2024 9:06:31 AM  Radiology: CT T-SPINE NO CHARGE Result Date: 01/23/2024 EXAM: CT THORACIC SPINE WITHOUT CONTRAST 01/23/2024 10:08:10 AM TECHNIQUE: CT of the thoracic spine was performed without the administration of intravenous contrast. Multiplanar reformatted images are provided for review. Automated exposure control, iterative reconstruction, and/or weight based adjustment of the mA/kV was utilized to reduce the radiation dose to as low as reasonably achievable. COMPARISON: Thoracic spine series dated 06/08/2020. CLINICAL HISTORY: FINDINGS: BONES AND ALIGNMENT: Normal vertebral body heights. No acute fracture or suspicious bone lesion. Normal alignment. DEGENERATIVE  CHANGES: There are anterior and lateral bridging osteophytes extending from T4 to T11. The disc spaces are satisfactorily preserved and the spinal canal and neural foramina are patent throughout the thoracic spine. Differential diagnoses for these findings include Diffuse Idiopathic Skeletal Hyperostosis (DISH) and spondyloarthropathy. Clinical correlation is recommended. If spondyloarthropathy is suspected, further evaluation including sacroiliac joint imaging and laboratory tests may be warranted. SOFT TISSUES: No acute abnormality. IMPRESSION: 1. 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 2. Satisfactorily preserved disc spaces and patent spinal canal and neural foramina throughout the thoracic spine; no acute osseous abnormality. Electronically signed by: Evalene Coho MD 01/23/2024 11:34 AM EST  RP Workstation: [id number]   CT Angio Chest PE W and/or Wo Contrast Result Date: 01/23/2024 CLINICAL DATA:  One-week history of chest and back pain with cough EXAM: CT ANGIOGRAPHY CHEST WITH CONTRAST TECHNIQUE: Multidetector CT imaging of the chest was performed using the standard protocol during bolus administration of intravenous contrast. Multiplanar CT image reconstructions and MIPs were obtained to evaluate the vascular anatomy. RADIATION DOSE REDUCTION: This exam was performed according to the departmental dose-optimization program which includes automated exposure control, adjustment of the mA and/or kV according to patient size and/or use of iterative reconstruction technique. CONTRAST:  75mL OMNIPAQUE  IOHEXOL  350 MG/ML SOLN COMPARISON:  Chest radiograph dated 01/20/2024 FINDINGS: Cardiovascular: The study is high quality for the evaluation of pulmonary embolism. There are no filling defects in the central, lobar, segmental or subsegmental pulmonary artery branches to suggest acute  pulmonary embolism. Great vessels are normal in course and caliber. Normal heart size. No significant pericardial fluid/thickening. Mediastinum/Nodes: Imaged thyroid gland without nodules meeting criteria for imaging follow-up by size. Normal esophagus. No pathologically enlarged axillary, supraclavicular, mediastinal, or hilar lymph nodes. Lungs/Pleura: The central airways are patent. No focal consolidation. Band-like ground-glass opacity along the right major fissure with mild mosaic attenuation of the right lower lobe. No pneumothorax. No pleural effusion. Upper abdomen: Normal. Musculoskeletal: No acute or abnormal lytic or blastic osseous lesions. Multilevel degenerative changes of the thoracic spine. Please see separately dictated CT thoracic spine report for detailed findings. Review of the MIP images confirms the above findings. IMPRESSION: 1. No evidence of pulmonary embolism. 2. Band-like ground-glass opacity along the right major fissure with mild mosaic attenuation of the right lower lobe, which may represent atelectasis or small airways disease. Electronically Signed   By: Limin  Xu M.D.   On: 01/23/2024 11:06     Procedures   Medications Ordered in the ED  cefTRIAXone  (ROCEPHIN ) 1 g in sodium chloride  0.9 % 100 mL IVPB (1 g Intravenous New Bag/Given 01/23/24 1223)  acetaminophen  (TYLENOL ) tablet 650 mg (has no administration in time range)  iohexol  (OMNIPAQUE ) 350 MG/ML injection 75 mL (75 mLs Intravenous Contrast Given 01/23/24 0943)  potassium chloride  SA (KLOR-CON  M) CR tablet 40 mEq (40 mEq Oral Given 01/23/24 1058)  azithromycin  (ZITHROMAX ) tablet 500 mg (500 mg Oral Given 01/23/24 1218)                                    Medical Decision Making Amount and/or Complexity of Data Reviewed Labs: ordered. Radiology: ordered.  Risk OTC drugs. Prescription drug management.   This patient presents to the ED with concern for chest pain, back pain, coughing, pleuritic pain. This involves  an extensive number of treatment options, and is a complaint that carries with it a high risk of complications and morbidity.  The differential diagnosis includes viral URI or infection versus bacterial infection or pneumonia versus pleuritis versus pneumothorax versus pulmonary embolism versus other  There may be some component of her referred spinal pain as well from the upper thoracic spine given how positional it is not reproducible to axial loading.  He does not have risk factors for epidural abscess or red flag symptoms for spinal infection to warrant emergent MRI at this time.  He also does not have any weakness of the arms or legs or paresthesias to raise concern for high-grade spinal encroachment or nerve root encroachment.  External records from outside source obtained and reviewed  including urgent care evaluations  I ordered and personally interpreted labs.  The pertinent results include: Troponin is undetectable with nearly 7 days of symptoms..  Potassium is mildly low at 3.1  I ordered imaging studies including CT PE study with contrast I independently visualized and interpreted imaging which showed possible small airway disease in the right lung, bone hypertrophy noted on thoracic imaging consistent with potential DISH I agree with the radiologist interpretation  The patient was maintained on a cardiac monitor.  I personally viewed and interpreted the cardiac monitored which showed an underlying rhythm of: Sinus rhythm  Per my interpretation the patient's ECG shows sinus rhythm with no acute ischemia  I ordered medication including oral potassium for hypokalemia; IV and oral antibiotics for suspected community pneumonia  I have reviewed the patients home medicines and have made adjustments as needed  Test Considered: Lower suspicion for aortic dissection or ACS.  Undetectable troponin and nonischemic EKG.  No tachycardia or hypertension.  Pulses are equally bilateral and he is not  hypertensive.  No smoking risk factor for aneurysm.  Social Determinants of Health: Spanish translator was used for the full history and exam.  Explained to the patient and family using a Engineer, structural the need for outpatient follow-up with his PCP for possible pneumonia, which we are treating with 4 more days of azithromycin .  I recommend at this point that he discontinue his other medications I believe he is having side effects from them, particular prednisone , which make him feel jittery and awful.  I do not think this is an asthma exacerbation.  I will prescribe him 7 days of potassium supplementation for his hypokalemia.  I also do not have any concern at this point for pulmonary embolism or ACS or other life-threatening cause of the patient's symptoms.  I think he is stable for outpatient follow-up.  I did provide written instructions for follow-up regarding his hypertrophy and osteophytic findings on thoracic imaging, which may be contributing to his pain.  Can see PCP for this issue.  Disposition:  After consideration of the diagnostic results and the patient's response to treatment, I feel that the patent would benefit from outpatient PCP follow-up.      Final diagnoses:  Pneumonia due to infectious organism, unspecified laterality, unspecified part of lung  Hypokalemia  Midline thoracic back pain, unspecified chronicity    ED Discharge Orders          Ordered    potassium chloride  SA (KLOR-CON  M) 20 MEQ tablet  Daily        01/23/24 1153    azithromycin  (ZITHROMAX ) 250 MG tablet  Daily       Note to Pharmacy: Initial dose given in ER   01/23/24 1153               Cottie Donnice PARAS, MD 01/23/24 1246  "

## 2024-01-23 NOTE — ED Triage Notes (Signed)
 Patient reports back and chest pain that started 1 week ago, cough, denies fever/chills/hematuria.dysuria. Patient is alert and oriented x 4. Airway patent, respirations even and unlabored. Skin normal, warm and dry.

## 2024-01-23 NOTE — Discharge Instructions (Addendum)
 Your CT scan showed that you may have a small infection or pneumonia in your right lung.  This can be a cause of chest pain.  Your scan also showed that you have extra bone growth on your spine.  This is not a life-threatening condition, but this can lead to back pain and spinal pain.  According to the radiologist, this condition might be diffuse idiopathic skeletal hyperostosis (DISH).  But this is NOT a final diagnosis in the ER today.   I recommend you follow-up with your primary care clinic for a recheck of your symptoms.

## 2024-01-24 ENCOUNTER — Other Ambulatory Visit: Payer: Self-pay

## 2024-01-26 ENCOUNTER — Ambulatory Visit (HOSPITAL_COMMUNITY)
Admission: EM | Admit: 2024-01-26 | Discharge: 2024-01-26 | Disposition: A | Discharge status: Home / Self Care | Payer: Self-pay | Attending: Family Medicine | Admitting: Family Medicine

## 2024-01-26 ENCOUNTER — Other Ambulatory Visit: Payer: Self-pay

## 2024-01-26 ENCOUNTER — Encounter (HOSPITAL_COMMUNITY): Payer: Self-pay

## 2024-01-26 DIAGNOSIS — J189 Pneumonia, unspecified organism: Secondary | ICD-10-CM

## 2024-01-26 DIAGNOSIS — R209 Unspecified disturbances of skin sensation: Secondary | ICD-10-CM

## 2024-01-26 MED ORDER — BENZONATATE 100 MG PO CAPS
100.0000 mg | ORAL_CAPSULE | Freq: Three times a day (TID) | ORAL | 0 refills | Status: DC | PRN
  Filled 2024-01-26: qty 21, 7d supply, fill #0

## 2024-01-26 NOTE — ED Triage Notes (Signed)
 Patient here today for a follow up from his visit at the hospital 3 days ago. Patient was diagnosed with pneumonia and having some pain in his chest and upper back with cough. Patient states that the pain in his chest has improved some but he has a cold sensation in his feet. Patient has been taking his medication. He also states that he was taking Prednisone  but I don't see it in his medication list.

## 2024-01-26 NOTE — ED Provider Notes (Addendum)
 " [facility]    CSN: [id number] Arrival date & time: 01/26/24  9057      History   Chief Complaint Chief Complaint  Patient presents with   Follow-up    Pneumonia    [name] is a 54 y.o. male.   HPI Here for cold sensation in his feet.  On January 6 he was seen in the emergency room and diagnosed with possible pneumonia.  Chest x-ray about 4 days prior had been negative for infiltrate.  On CT on January 6 there was 1 area of possible ground glass banding that could have been atelectasis or small airway disease.  He was therefore treated with Rocephin  and azithromycin .  He states he is also taking prednisone .  Overall he is feeling better, with less shortness of breath and less pain in his upper back and chest.  He still is coughing a good bit.  He has noted since yesterday a feeling of coldness on the bottoms of both feet.  No tingling or numbness.  He is allergic to penicillins and ibuprofen  and methocarbamol  and cyclobenzaprine . Past Medical History:  Diagnosis Date   Anxiety    Asthma    GERD (gastroesophageal reflux disease)    Herpes    Shingles    Umbilical hernia     Patient Active Problem List   Diagnosis Date Noted   Acute bilateral low back pain without sciatica 06/05/2020   Acute pain of right shoulder 06/05/2020   Asthma due to environmental allergies 12/20/2016   HSV (herpes simplex virus) infection 02/24/2015   Anxiety 04/02/2014    Past Surgical History:  Procedure Laterality Date   MOUTH SURGERY         Home Medications    Prior to Admission medications  Medication Sig Start Date End Date Taking? Authorizing Provider  benzonatate  (TESSALON ) 100 MG capsule Take 1 capsule (100 mg total) by mouth 3 (three) times daily as needed for cough. 01/26/24  Yes Vonna Sharlet POUR, MD  albuterol  (VENTOLIN  HFA) [phone] Base) MCG/ACT inhaler Inhale 1-2 puffs into the lungs every 6 (six) hours as needed for wheezing or shortness  of breath. 12/02/23   Mecum, Erin E, PA-C  azelastine  (OPTIVAR ) 0.05 % ophthalmic solution Place 1 drop into the right eye 2 (two) times daily. 01/22/24   Reddick, Johnathan B, NP  azithromycin  (ZITHROMAX ) 250 MG tablet Take 1 tablet (250 mg total) by mouth daily for 4 days. Take first 2 tablets together, then 1 every day until finished. 01/24/24 01/28/24  Cottie Donnice PARAS, MD  baclofen  5 MG TABS Take 1 tablet (5 mg total) by mouth at bedtime as needed for muscle spasms. 01/20/24   Raspet, Erin K, PA-C  levocetirizine (XYZAL  ALLERGY 24HR) 5 MG tablet Take 1 tablet (5 mg total) by mouth every evening. 01/05/24   Newlin, Enobong, MD  omeprazole  (PRILOSEC) 40 MG capsule Take 1 capsule (40 mg total) by mouth daily. Patient not taking: Reported on 01/20/2024 01/05/24   Newlin, Enobong, MD  polyethylene glycol powder (GLYCOLAX /MIRALAX ) 17 GM/SCOOP powder MIx 17 grams (1 capful) in 4- 8 ounces of liquid once daily. 01/05/24   Newlin, Enobong, MD  potassium chloride  SA (KLOR-CON  M) 20 MEQ tablet Take 1 tablet (20 mEq total) by mouth daily. 01/24/24 01/31/24  Cottie Donnice PARAS, MD  potassium chloride  SA (KLOR-CON  M) 20 MEQ tablet Take 1 tablet (20 mEq total) by mouth daily. 01/23/24   Cottie Donnice PARAS, MD  valACYclovir  (VALTREX ) 1000  MG tablet Take 1 tablet (1,000 mg total) by mouth daily. 01/06/24   Fleming, Zelda W, NP  SUMAtriptan  (IMITREX ) 50 MG tablet Take one for headache.  May repeat in 2 hours.  No more than 2 a day 05/27/17 12/09/21  Maranda Jamee Jacob, MD    Family History Family History  Problem Relation Age of Onset   Cancer Father     Social History Social History[1]   Allergies   Amoxicillin , Cyclobenzaprine , Ibuprofen , Methocarbamol , and Penicillins   Review of Systems Review of Systems   Physical Exam Triage Vital Signs ED Triage Vitals [01/26/24 1111]  Encounter Vitals Group     BP 111/66     Girls Systolic BP Percentile      Girls Diastolic BP Percentile      Boys Systolic BP  Percentile      Boys Diastolic BP Percentile      Pulse Rate 76     Resp 16     Temp 98.4 F (36.9 C)     Temp Source Oral     SpO2 95 %     Weight      Height      Head Circumference      Peak Flow      Pain Score 2     Pain Loc      Pain Education      Exclude from Growth Chart    No data found.  Updated Vital Signs BP 111/66 (BP Location: Right Arm)   Pulse 76   Temp 98.4 F (36.9 C) (Oral)   Resp 16   SpO2 95%   Visual Acuity Right Eye Distance:   Left Eye Distance:   Bilateral Distance:    Right Eye Near:   Left Eye Near:    Bilateral Near:     Physical Exam Vitals reviewed.  Constitutional:      General: He is not in acute distress.    Appearance: He is not ill-appearing, toxic-appearing or diaphoretic.  HENT:     Mouth/Throat:     Mouth: Mucous membranes are moist.  Eyes:     Extraocular Movements: Extraocular movements intact.     Pupils: Pupils are equal, round, and reactive to light.  Cardiovascular:     Rate and Rhythm: Normal rate and regular rhythm.     Heart sounds: No murmur heard. Pulmonary:     Effort: Pulmonary effort is normal. No respiratory distress.     Breath sounds: Normal breath sounds. No stridor. No wheezing, rhonchi or rales.  Musculoskeletal:     Cervical back: Neck supple.     Right lower leg: No edema.     Left lower leg: No edema.     Comments: The dorsalis pedis and posterior tibial pulses are normal and 2+ in both feet and ankles.  There is no edema of either foot.  There is no erythema.  Capillary refill is 2 seconds bilaterally in color is normal.  Feet are warm.  No ulcerations or calluses.  Lymphadenopathy:     Cervical: No cervical adenopathy.  Skin:    Coloration: Skin is not jaundiced or pale.  Neurological:     General: No focal deficit present.     Mental Status: He is alert and oriented to person, place, and time.      UC Treatments / Results  Labs (all labs ordered are listed, but only abnormal results  are displayed) Labs Reviewed - No data to display  EKG  Radiology No results found.  Procedures Procedures (including critical care time)  Medications Ordered in UC Medications - No data to display  Initial Impression / Assessment and Plan / UC Course  I have reviewed the triage vital signs and the nursing notes.  Pertinent labs & imaging results that were available during my care of the patient were reviewed by me and considered in my medical decision making (see chart for details).   His blood pressure is normal here and stable compared to prior visits.  Heart rate is normal.  Oxygen saturation is normal.  Exam of his feet is normal.  I discussed with him that this is a potential side effect possibly from his prednisone ?.  I have asked him to finish out his medications as prescribed.  I have also sent in medication for cough.  Visit is conducted in Spanish  Final Clinical Impressions(s) / UC Diagnoses   Final diagnoses:  Cold feet  Pneumonia due to infectious organism, unspecified laterality, unspecified part of lung     Discharge Instructions      Take benzonatate  100 mg, 1 tab every 8 hours as needed for cough.  Exam of your feet is normal.  It seems that the circulation is normal.  Your symptoms could be a side effect of medication  Finish taking your medications you are prescribed for your pneumonia.  (Tome benzonatato 100 mg, un comprimido cada 8 horas segn sea necesario para la tos.  El examen de sus pies es normal. Parece que la circulacin es normal. Sus sntomas podran ser un efecto secundario de la medicacin.  Termina de smurfit-stone container te recetaron para la neumona.)      ED Prescriptions     Medication Sig Dispense Auth. Provider   benzonatate  (TESSALON ) 100 MG capsule Take 1 capsule (100 mg total) by mouth 3 (three) times daily as needed for cough. 21 capsule Nalina Yeatman K, MD      PDMP not reviewed this encounter.     Vonna Sharlet POUR, MD 01/26/24 1152     [1]  Social History Tobacco Use   Smoking status: Former    Passive exposure: Past   Smokeless tobacco: Never  Vaping Use   Vaping status: Never Used  Substance Use Topics   Alcohol use: No   Drug use: No     Vonna Sharlet POUR, MD 01/26/24 1204  "

## 2024-01-26 NOTE — Discharge Instructions (Addendum)
 Take benzonatate  100 mg, 1 tab every 8 hours as needed for cough.  Exam of your feet is normal.  It seems that the circulation is normal.  Your symptoms could be a side effect of medication  Finish taking your medications you are prescribed for your pneumonia.  (Tome benzonatato 100 mg, un comprimido cada 8 horas segn sea necesario para la tos.  El examen de sus pies es normal. Parece que la circulacin es normal. Sus sntomas podran ser un efecto secundario de la medicacin.  Termina de smurfit-stone container te recetaron para la neumona.)

## 2024-01-28 ENCOUNTER — Emergency Department (HOSPITAL_COMMUNITY)
Admission: EM | Admit: 2024-01-28 | Discharge: 2024-01-28 | Disposition: A | Discharge status: Home / Self Care | Payer: Self-pay | Attending: Emergency Medicine | Admitting: Emergency Medicine

## 2024-01-28 DIAGNOSIS — Z0000 Encounter for general adult medical examination without abnormal findings: Secondary | ICD-10-CM | POA: Insufficient documentation

## 2024-01-28 DIAGNOSIS — R051 Acute cough: Secondary | ICD-10-CM | POA: Insufficient documentation

## 2024-01-28 DIAGNOSIS — Z720 Tobacco use: Secondary | ICD-10-CM | POA: Insufficient documentation

## 2024-01-28 DIAGNOSIS — Z09 Encounter for follow-up examination after completed treatment for conditions other than malignant neoplasm: Secondary | ICD-10-CM

## 2024-01-28 NOTE — ED Triage Notes (Addendum)
 Pt reports he is here for a follow up after completing medication for PNA, pt denies any worsening of symptoms, pt talking in full sentences, RR even and unlabored. Pt also seen 1/8 for a f/u as well but did have medical complaints at that time

## 2024-01-28 NOTE — ED Provider Notes (Signed)
 " [place] [facility]MENT AT [facility] Provider Note   CSN: [id number] Arrival date & time: 01/28/24  1625     Patient presents with: Follow-up   Tishawn Friedhoff is a 54 y.o. male.   54 year old male with a past medical history of bronchitis presents to the ED with a chief complaint of follow-up.  Patient tells me that he recently completed medication for underlying pneumonia, does feel improvement in his symptoms, he is here with his daughter therefore he wanted to make sure that his pneumonia had resolved.  He has not had any increase in his cough, no fevers, no worsening symptoms. [place] any underlying history of tobacco use, no leg swelling, no chest pain or other complaints reported.  The history is provided by the patient.       Prior to Admission medications  Medication Sig Start Date End Date Taking? Authorizing Provider  albuterol  (VENTOLIN  HFA) 108 (90 Base) MCG/ACT inhaler Inhale 1-2 puffs into the lungs every 6 (six) hours as needed for wheezing or shortness of breath. 12/02/23   Mecum, Erin E, PA-C  azelastine  (OPTIVAR ) 0.05 % ophthalmic solution Place 1 drop into the right eye 2 (two) times daily. 01/22/24   Reddick, Johnathan B, NP  azithromycin  (ZITHROMAX ) 250 MG tablet Take 1 tablet (250 mg total) by mouth daily for 4 days. Take first 2 tablets together, then 1 every day until finished. 01/24/24 01/28/24  Cottie Donnice PARAS, MD  baclofen  5 MG TABS Take 1 tablet (5 mg total) by mouth at bedtime as needed for muscle spasms. 01/20/24   Raspet, Erin K, PA-C  benzonatate  (TESSALON ) 100 MG capsule Take 1 capsule (100 mg total) by mouth 3 (three) times daily as needed for cough. 01/26/24   Banister, Pamela K, MD  levocetirizine (XYZAL  ALLERGY 24HR) 5 MG tablet Take 1 tablet (5 mg total) by mouth every evening. 01/05/24   Newlin, Enobong, MD  omeprazole  (PRILOSEC) 40 MG capsule Take 1 capsule (40 mg total) by mouth daily. Patient not taking: Reported on 01/20/2024 01/05/24    Newlin, Enobong, MD  polyethylene glycol powder (GLYCOLAX /MIRALAX ) 17 GM/SCOOP powder MIx 17 grams (1 capful) in 4- 8 ounces of liquid once daily. 01/05/24   Newlin, Enobong, MD  potassium chloride  SA (KLOR-CON  M) 20 MEQ tablet Take 1 tablet (20 mEq total) by mouth daily. 01/24/24 01/31/24  Cottie Donnice PARAS, MD  potassium chloride  SA (KLOR-CON  M) 20 MEQ tablet Take 1 tablet (20 mEq total) by mouth daily. 01/23/24   Cottie Donnice PARAS, MD  valACYclovir  (VALTREX ) 1000 MG tablet Take 1 tablet (1,000 mg total) by mouth daily. 01/06/24   Fleming, Zelda W, NP  SUMAtriptan  (IMITREX ) 50 MG tablet Take one for headache.  May repeat in 2 hours.  No more than 2 a day 05/27/17 12/09/21  Maranda Jamee Jacob, MD    Allergies: Amoxicillin , Cyclobenzaprine , Ibuprofen , Methocarbamol , and Penicillins    Review of Systems  Constitutional:  Negative for fever.  Respiratory:  Positive for cough. Negative for shortness of breath.     Updated Vital Signs BP 120/85   Pulse 88   Temp 98.4 F (36.9 C)   Resp 16   SpO2 98%   Physical Exam Vitals and nursing note reviewed.  Constitutional:      Appearance: Normal appearance.  HENT:     Head: Normocephalic and atraumatic.     Nose: Nose normal.     Mouth/Throat:     Mouth: Mucous membranes are moist.  Cardiovascular:  Rate and Rhythm: Normal rate.  Pulmonary:     Effort: Pulmonary effort is normal.     Breath sounds: Normal breath sounds. No decreased breath sounds, wheezing or rhonchi.  Abdominal:     General: Abdomen is flat.  Musculoskeletal:     Cervical back: Normal range of motion and neck supple.  Skin:    General: Skin is warm and dry.  Neurological:     Mental Status: He is alert and oriented to person, place, and time.     (all labs ordered are listed, but only abnormal results are displayed) Labs Reviewed - No data to display  EKG: None  Radiology: No results found.   Procedures   Medications Ordered in the ED - No data to  display                                  Medical Decision Making   Patient present to the ED requesting follow-up, he tells me that he was treated for underlying pneumonia given multiple medications and antibiotics.  He has had resolution in symptoms however he is here with his daughter who is a patient at this time, therefore he thought that he might need to have a recheck.  According to extensive records review, I do see that patient had a CT angio a few days ago, was treated with antibiotics, steroids.  He reports he has completed all the medication and his symptoms are now resolved.  His lungs are clear to auscultation, he is not having any tachypnea, no hypoxia, no wheezing on my exam to further need imaging at this time.  We discussed appropriate follow-up with PCP.  Patient hemodynamically stable for discharge.   Portions of this note were generated with Scientist, clinical (histocompatibility and immunogenetics). Dictation errors may occur despite best attempts at proofreading.   Final diagnoses:  Acute cough  Follow-up exam    ED Discharge Orders     None          Maureen Broad, PA-C 01/28/24 1750    Franklyn Sid SAILOR, MD 01/28/24 1812  "

## 2024-02-03 ENCOUNTER — Telehealth: Payer: Self-pay | Admitting: Nurse Practitioner

## 2024-02-03 ENCOUNTER — Other Ambulatory Visit: Payer: Self-pay

## 2024-02-03 ENCOUNTER — Other Ambulatory Visit: Payer: Self-pay | Admitting: *Deleted

## 2024-02-03 ENCOUNTER — Ambulatory Visit: Payer: Self-pay | Attending: *Deleted | Admitting: *Deleted

## 2024-02-03 ENCOUNTER — Encounter: Payer: Self-pay | Admitting: *Deleted

## 2024-02-03 VITALS — BP 107/68 | HR 74 | Temp 98.0°F | Ht 65.0 in | Wt 196.0 lb

## 2024-02-03 DIAGNOSIS — R051 Acute cough: Secondary | ICD-10-CM

## 2024-02-03 DIAGNOSIS — J189 Pneumonia, unspecified organism: Secondary | ICD-10-CM

## 2024-02-03 MED ORDER — LORATADINE 10 MG PO TABS
10.0000 mg | ORAL_TABLET | Freq: Every day | ORAL | 11 refills | Status: AC
  Filled 2024-02-03 – 2024-02-08 (×2): qty 30, 30d supply, fill #0

## 2024-02-03 MED ORDER — BENZONATATE 100 MG PO CAPS
100.0000 mg | ORAL_CAPSULE | Freq: Two times a day (BID) | ORAL | 0 refills | Status: DC | PRN
  Filled 2024-02-03: qty 20, 10d supply, fill #0

## 2024-02-03 MED ORDER — PSEUDOEPH-BROMPHEN-DM 30-2-10 MG/5ML PO SYRP
5.0000 mL | ORAL_SOLUTION | Freq: Four times a day (QID) | ORAL | 0 refills | Status: AC | PRN
  Filled 2024-02-03: qty 120, 6d supply, fill #0

## 2024-02-03 NOTE — Patient Instructions (Signed)
 Today we discussed your pneumonia and cough. You were evaluated and treated appropriately and are about 15 days into your pneumonia. We did discuss that your cough may be related to pneumonia, history of asthma and postnasal drip. We discussed treatment options including an albuterol  inhaler, prednisone , cough suppression. We also talked about an antihistamine as that would help her postnasal drip. I will send the antihistamine and cough suppressant to the pharmacy of your choice. I will order a chest x-ray and follow-up of your pneumonia

## 2024-02-03 NOTE — Telephone Encounter (Signed)
 Copied from CRM [id number]. Topic: General - Other >> Feb 02, 2024  3:14 PM   Zebedee SAUNDERS wrote:  Reason for CRM: Pt needs a sooner appt for hospital follow up please call pt at [phone].

## 2024-02-03 NOTE — Progress Notes (Signed)
 Came back to clinic after his visit and requested a liquid cough medicine instead of Tessalon  Perles. I ordered Bromfed 1 teaspoon 4 times daily as needed

## 2024-02-03 NOTE — Progress Notes (Signed)
 "   Patient ID: Gabriel Ball, male    DOB: [birth year]  MRN: [id number]  CC: Cough   Subjective: Interpreter Anahy [id number] Johnney Scarlata is a 54 y.o. male who presents for cough following diagnosis of pneumonia 15 days ago.  He has completed his medication (Z-Pak) and does feel improvement in his symptoms but is concerned about whether his pneumonia has resolved or not.  On 01/24/2024 he was seen in the emergency room.  Chest x-ray 4 days prior have been negative for infiltrate.  On CT angiogram of the chest there was 1 area of possible ground glass banding that could have been atelectasis versus small airway disease (right lower lobe) he was therefore treated with Rocephin , azithromycin  and prednisone .    He does continue to cough occasionally .  His cough is associated with  a tickling sensation in his throat.  It reminds him of postnasal drip that he has had with his environmental allergies in the past.  He has done well on antihistamines in the past.  He denies fever or worsening symptoms of pneumonia.  Denies underlying history of tobacco use no leg swelling, chest pain or other complaints.  Pertinent past medical history include: Asthma due to environmental allergies    Patient Active Problem List   Diagnosis Date Noted   Acute bilateral low back pain without sciatica 06/05/2020   Acute pain of right shoulder 06/05/2020   Asthma due to environmental allergies 12/20/2016   HSV (herpes simplex virus) infection 02/24/2015   Anxiety 04/02/2014     Medications Ordered Prior to Encounter[1]  Allergies[2]  Social History   Socioeconomic History   Marital status: Significant Other    Spouse name: Not on file   Number of children: Not on file   Years of education: Not on file   Highest education level: Not on file  Occupational History   Not on file  Tobacco Use   Smoking status: Former    Passive exposure: Past   Smokeless tobacco: Never  Vaping Use   Vaping status: Never  Used  Substance and Sexual Activity   Alcohol use: No   Drug use: No   Sexual activity: Yes  Other Topics Concern   Not on file  Social History Narrative   Single and lives with daughter and significant other.    Social Drivers of Health   Tobacco Use: Medium Risk (02/03/2024)   Patient History    Smoking Tobacco Use: Former    Smokeless Tobacco Use: Never    Passive Exposure: Past  Programmer, Applications: Not on [employer] Insecurity: Not on file  Transportation Needs: Not on file  Physical Activity: Not on file  Stress: Not on file  Social Connections: Not on file  Intimate Partner Violence: Not on file  Depression (PHQ2-9): Low Risk (01/31/2023)   Depression (PHQ2-9)    PHQ-2 Score: 0  Alcohol Screen: Not on file  Housing: Not on file  Utilities: Not on file  Health Literacy: Not on file    Family History  Problem Relation Age of Onset   Cancer Father     Past Surgical History:  Procedure Laterality Date   MOUTH SURGERY      ROS: Review of Systems Negative except as stated above  PHYSICAL EXAM: BP 107/68   Pulse 74   Temp 98 F (36.7 C) (Oral)   Ht 5' 5 (1.651 m)   Wt 196 lb (88.9 kg)   SpO2 98%   BMI 32.62  kg/m   Physical Exam Vitals and nursing note reviewed.  HENT:     Mouth/Throat:     Mouth: Mucous membranes are moist.     Pharynx: Oropharynx is clear.     Comments: Pharynx with obvious postnasal drip.  no exudate or abscess.   no lymphadenopathy Eyes:     Conjunctiva/sclera: Conjunctivae normal.  Cardiovascular:     Rate and Rhythm: Normal rate and regular rhythm.  Pulmonary:     Effort: Pulmonary effort is normal.     Breath sounds: Normal breath sounds.     Comments: Pulse ox 98% on room air Skin:    General: Skin is warm and dry.  Neurological:     Mental Status: He is alert. Mental status is at baseline.          Latest Ref Rng & Units 01/23/2024    9:05 AM 01/24/2023   10:00 AM 01/18/2023    8:37 AM  CMP  Glucose 70 -  99 mg/dL 851  895  90   BUN 6 - 20 mg/dL 9  9  11    Creatinine 0.61 - 1.24 mg/dL 9.27  9.17  9.18   Sodium 135 - 145 mmol/L 141  137  141   Potassium 3.5 - 5.1 mmol/L 3.1  3.5  4.2   Chloride 98 - 111 mmol/L 105  105  105   CO2 22 - 32 mmol/L 25  24  21    Calcium 8.9 - 10.3 mg/dL 9.0  9.1  9.3   Total Protein 6.5 - 8.1 g/dL  7.5  6.8   Total Bilirubin 0.0 - 1.2 mg/dL  0.8  0.5   Alkaline Phos 38 - 126 U/L  71  93   AST 15 - 41 U/L  27  27   ALT 0 - 44 U/L  29  35    Lipid Panel     Component Value Date/Time   CHOL 200 (H) 01/18/2023 0837   TRIG 148 01/18/2023 0837   HDL 28 (L) 01/18/2023 0837   CHOLHDL 7.1 (H) 01/18/2023 0837   LDLCALC 145 (H) 01/18/2023 0837    CBC    Component Value Date/Time   WBC 10.7 (H) 01/23/2024 0905   RBC 5.37 01/23/2024 0905   HGB 16.0 01/23/2024 0905   HGB 15.8 01/18/2023 0837   HCT 45.9 01/23/2024 0905   HCT 47.0 01/18/2023 0837   PLT 236 01/23/2024 0905   PLT 226 01/18/2023 0837   MCV 85.5 01/23/2024 0905   MCV 87 01/18/2023 0837   MCH 29.8 01/23/2024 0905   MCHC 34.9 01/23/2024 0905   RDW 13.4 01/23/2024 0905   RDW 13.7 01/18/2023 0837   LYMPHSABS 2.1 01/18/2023 0837   MONOABS 0.4 08/09/2022 1551   EOSABS 0.2 01/18/2023 0837   BASOSABS 0.0 01/18/2023 0837    Results for orders placed or performed during the hospital encounter of 01/23/24  Basic metabolic panel   Collection Time: 01/23/24  9:05 AM  Result Value Ref Range   Sodium 141 135 - 145 mmol/L   Potassium 3.1 (L) 3.5 - 5.1 mmol/L   Chloride 105 98 - 111 mmol/L   CO2 25 22 - 32 mmol/L   Glucose, Bld 148 (H) 70 - 99 mg/dL   BUN 9 6 - 20 mg/dL   Creatinine, Ser 9.27 0.61 - 1.24 mg/dL   Calcium 9.0 8.9 - 89.6 mg/dL   GFR, Estimated >39 >39 mL/min   Anion gap 12 5 - 15  CBC   Collection Time: 01/23/24  9:05 AM  Result Value Ref Range   WBC 10.7 (H) 4.0 - 10.5 K/uL   RBC 5.37 4.22 - 5.81 MIL/uL   Hemoglobin 16.0 13.0 - 17.0 g/dL   HCT 54.0 60.9 - 47.9 %   MCV 85.5 80.0  - 100.0 fL   MCH 29.8 26.0 - 34.0 pg   MCHC 34.9 30.0 - 36.0 g/dL   RDW 86.5 88.4 - 84.4 %   Platelets 236 150 - 400 K/uL   nRBC 0.0 0.0 - 0.2 %  Troponin T, High Sensitivity   Collection Time: 01/23/24  9:05 AM  Result Value Ref Range   Troponin T High Sensitivity <15 0 - 19 ng/L     ASSESSMENT AND PLAN:  Assessment & Plan Acute cough Despite completing his azithromycin  and prednisone  he continues to cough. He does have history of asthma as well as environmental allergies. This cough is remind him if when he has postnasal drip. His exam is suggestive of postnasal drip. An antihistamine is added today to help with postnasal drip that should decrease his cough trigger. Also added cough suppressant to decrease unnecessary cough. He is to notify the clinic if the symptoms persist or worsen Orders:   loratadine  (CLARITIN ) 10 MG tablet; Take 1 tablet (10 mg total) by mouth daily.   benzonatate  (TESSALON ) 100 MG capsule; Take 1 capsule (100 mg total) by mouth 2 (two) times daily as needed for cough.  Pneumonia of right lower lobe due to infectious organism Diagnosed 15 days ago. Found on CT angiogram and appeared as a ground glass opacity in the right lower lobe. Treated maximally with Rocephin , azithromycin  and prednisone  with improvement.  He does have a persistent cough. He is agreeable to trying Claritin  and Tessalon  Perles as above and will notify the clinic if his symptoms persist or worsen.   I did order an follow-up chest x-ray in 1 month. Plan accordingly  Orders:   DG Chest 2 View; Future       Patient was given the opportunity to ask questions.  Patient verbalized understanding of the plan and was able to repeat key elements of the plan.   This documentation was completed using Paediatric nurse.  Any transcriptional errors are unintentional.     Requested Prescriptions    No prescriptions requested or ordered in this encounter    No  follow-ups on file.  Verdie Barrows H, NP      [1]  Current Outpatient Medications on File Prior to Visit  Medication Sig Dispense Refill   albuterol  (VENTOLIN  HFA) 108 (90 Base) MCG/ACT inhaler Inhale 1-2 puffs into the lungs every 6 (six) hours as needed for wheezing or shortness of breath. 6.7 g 1   azelastine  (OPTIVAR ) 0.05 % ophthalmic solution Place 1 drop into the right eye 2 (two) times daily. 6 mL 0   baclofen  5 MG TABS Take 1 tablet (5 mg total) by mouth at bedtime as needed for muscle spasms. 5 tablet 0   levocetirizine (XYZAL  ALLERGY 24HR) 5 MG tablet Take 1 tablet (5 mg total) by mouth every evening. 30 tablet 1   polyethylene glycol powder (GLYCOLAX /MIRALAX ) 17 GM/SCOOP powder MIx 17 grams (1 capful) in 4- 8 ounces of liquid once daily. 476 g 6   potassium chloride  SA (KLOR-CON  M) 20 MEQ tablet Take 1 tablet (20 mEq total) by mouth daily. 7 tablet 0   potassium chloride  SA (KLOR-CON  M) 20 MEQ tablet Take 1 tablet (  20 mEq total) by mouth daily. 7 tablet 0   valACYclovir  (VALTREX ) 1000 MG tablet Take 1 tablet (1,000 mg total) by mouth daily. 90 tablet 3   benzonatate  (TESSALON ) 100 MG capsule Take 1 capsule (100 mg total) by mouth 3 (three) times daily as needed for cough. (Patient not taking: Reported on 02/03/2024) 21 capsule 0   omeprazole  (PRILOSEC) 40 MG capsule Take 1 capsule (40 mg total) by mouth daily. (Patient not taking: Reported on 02/03/2024) 30 capsule 1   [DISCONTINUED] SUMAtriptan  (IMITREX ) 50 MG tablet Take one for headache.  May repeat in 2 hours.  No more than 2 a day 10 tablet 0   No current facility-administered medications on file prior to visit.  [2]  Allergies Allergen Reactions   Amoxicillin  Itching    Pruritic rash   Cyclobenzaprine  Other (See Comments)    Facial Numbness/dizziness/hypersomnolence   Ibuprofen  Hives   Methocarbamol  Other (See Comments)    Abdominal pain   Penicillins Palpitations    Has patient had a PCN reaction causing immediate  rash, facial/tongue/throat swelling, SOB or lightheadedness with hypotension: No Has patient had a PCN reaction causing severe rash involving mucus membranes or skin necrosis: No Has patient had a PCN reaction that required hospitalization: No Has patient had a PCN reaction occurring within the last 10 years: No If all of the above answers are NO, then may proceed with Cephalosporin use.   "

## 2024-02-03 NOTE — Telephone Encounter (Signed)
 Attempted to contact patient, unable to reach. Call disconnected before a voicemail could be left.

## 2024-02-08 ENCOUNTER — Other Ambulatory Visit: Payer: Self-pay

## 2024-02-23 ENCOUNTER — Emergency Department (HOSPITAL_COMMUNITY)
Admission: EM | Admit: 2024-02-23 | Discharge: 2024-02-23 | Disposition: A | Discharge status: Home / Self Care | Payer: Self-pay | Source: Home / Self Care | Attending: Emergency Medicine | Admitting: Emergency Medicine

## 2024-02-23 ENCOUNTER — Other Ambulatory Visit: Payer: Self-pay

## 2024-02-23 ENCOUNTER — Emergency Department (HOSPITAL_COMMUNITY): Payer: Self-pay

## 2024-02-23 DIAGNOSIS — R052 Subacute cough: Secondary | ICD-10-CM

## 2024-02-23 DIAGNOSIS — R051 Acute cough: Secondary | ICD-10-CM

## 2024-02-23 DIAGNOSIS — R0789 Other chest pain: Secondary | ICD-10-CM

## 2024-02-23 DIAGNOSIS — R079 Chest pain, unspecified: Secondary | ICD-10-CM

## 2024-02-23 LAB — CBC
HCT: 47.3 % (ref 39.0–52.0)
Hemoglobin: 15.8 g/dL (ref 13.0–17.0)
MCH: 28.9 pg (ref 26.0–34.0)
MCHC: 33.4 g/dL (ref 30.0–36.0)
MCV: 86.6 fL (ref 80.0–100.0)
Platelets: 238 10*3/uL (ref 150–400)
RBC: 5.46 MIL/uL (ref 4.22–5.81)
RDW: 13.2 % (ref 11.5–15.5)
WBC: 7.1 10*3/uL (ref 4.0–10.5)
nRBC: 0 % (ref 0.0–0.2)

## 2024-02-23 LAB — BASIC METABOLIC PANEL WITH GFR
Anion gap: 10 (ref 5–15)
BUN: 10 mg/dL (ref 6–20)
CO2: 26 mmol/L (ref 22–32)
Calcium: 9.3 mg/dL (ref 8.9–10.3)
Chloride: 108 mmol/L (ref 98–111)
Creatinine, Ser: 0.69 mg/dL (ref 0.61–1.24)
GFR, Estimated: >60 mL/min
Glucose, Bld: 122 mg/dL — ABNORMAL HIGH (ref 70–99)
Potassium: 3.5 mmol/L (ref 3.5–5.1)
Sodium: 144 mmol/L (ref 135–145)

## 2024-02-23 LAB — TROPONIN T, HIGH SENSITIVITY
Troponin T High Sensitivity: 7 ng/L (ref 0–19)
Troponin T High Sensitivity: <6 ng/L (ref 0–19)

## 2024-02-23 MED ORDER — GUAIFENESIN 200 MG PO TABS
200.0000 mg | ORAL_TABLET | ORAL | 0 refills | Status: AC | PRN
  Filled 2024-02-23: qty 30, 5d supply, fill #0

## 2024-02-23 MED ORDER — BENZONATATE 100 MG PO CAPS
100.0000 mg | ORAL_CAPSULE | Freq: Three times a day (TID) | ORAL | 0 refills | Status: AC
  Filled 2024-02-23: qty 21, 7d supply, fill #0

## 2024-02-23 MED ORDER — OMEPRAZOLE 40 MG PO CPDR
40.0000 mg | DELAYED_RELEASE_CAPSULE | Freq: Every day | ORAL | 0 refills | Status: AC
  Filled 2024-02-23: qty 30, 30d supply, fill #0

## 2024-02-23 MED ORDER — BENZONATATE 100 MG PO CAPS
100.0000 mg | ORAL_CAPSULE | Freq: Two times a day (BID) | ORAL | 0 refills | Status: AC | PRN
  Filled 2024-02-23: qty 20, 10d supply, fill #0

## 2024-02-23 NOTE — ED Triage Notes (Addendum)
 Pt reports he is concerned that he is coughing and has a pressure in his chest X 1 week, pt reports was here a few weeks ago do PNA. Pt denies n/v/ or SOB associated with chest but is diaphoretic in triage

## 2024-02-23 NOTE — Discharge Instructions (Addendum)
 You have been evaluated for your symptoms.  Fortunately chest x-ray today did not show any signs of pneumonia.  Your blood work all normal.  Your cough could be a lingering cough from a prior pneumonia or it could also be due to heartburn causing acid reflux.  Take medication as prescribed and follow-up with your primary care doctor further care.  Se le han realizado pruebas para evaluar sus sntomas. Afortunadamente, la radiografa de trax de hoy no mostr signos de neumona. Los anlisis de sangre son normales. Su tos podra ser ignacia tos persistente despus de una neumona anterior o tambin podra deberse a acidez estomacal con reflujo cido. Tome la medicacin segn lo recetado y consulte con su mdico de cabecera para un seguimiento posterior.

## 2024-02-23 NOTE — ED Provider Notes (Cosign Needed)
 " [facility] AT [facility] Provider Note   CSN: [id number] Arrival date & time: 02/23/24  1004     Patient presents with: Chest Pain   Dustine Stickler is a 54 y.o. male.   The history is provided by the patient and medical records. The history is limited by a language barrier. A language interpreter was used.  Chest Pain    54 year old Spanish-speaking male with history of asthma, anxiety, GERD, presenting to the ED with complaints of chest discomfort.  History obtained using language interpreter.  Patient states a month ago he was having cough and he was evaluated in the ER.  He was diagnosed with pneumonia.  He was subsequently discharged home with medication.  He did report improvement of symptoms however for the past several days he has having similar symptoms.  Endorsed having persistent nonproductive cough, some chest discomfort, mild congestion.  He does not endorse any fever or chills no nausea, vomiting or diarrhea and specifically no shortness of breath.  He denies any significant cardiac history.  He denies alcohol or tobacco use.  No prior history of PE or DVT.  Does endorse feeling a bit hot.  Denies history of hypertension taking any ACE inhibitor.  Prior to Admission medications  Medication Sig Start Date End Date Taking? Authorizing Provider  albuterol  (VENTOLIN  HFA) 108 (90 Base) MCG/ACT inhaler Inhale 1-2 puffs into the lungs every 6 (six) hours as needed for wheezing or shortness of breath. 12/02/23   Mecum, Erin E, PA-C  azelastine  (OPTIVAR ) 0.05 % ophthalmic solution Place 1 drop into the right eye 2 (two) times daily. 01/22/24   Reddick, Johnathan B, NP  baclofen  5 MG TABS Take 1 tablet (5 mg total) by mouth at bedtime as needed for muscle spasms. 01/20/24   Raspet, Erin K, PA-C  benzonatate  (TESSALON ) 100 MG capsule Take 1 capsule (100 mg total) by mouth 2 (two) times daily as needed for cough. 02/03/24   Placey, Ronal Caldron, NP   brompheniramine-pseudoephedrine -DM 30-2-10 MG/5ML syrup Take 5 mLs by mouth 4 (four) times daily as needed. 02/03/24   Placey, Ronal Caldron, NP  loratadine  (CLARITIN ) 10 MG tablet Take 1 tablet (10 mg total) by mouth daily. 02/03/24   Placey, Ronal Caldron, NP  omeprazole  (PRILOSEC) 40 MG capsule Take 1 capsule (40 mg total) by mouth daily. Patient not taking: Reported on 02/03/2024 01/05/24   Newlin, Enobong, MD  polyethylene glycol powder (GLYCOLAX /MIRALAX ) 17 GM/SCOOP powder MIx 17 grams (1 capful) in 4- 8 ounces of liquid once daily. 01/05/24   Newlin, Enobong, MD  potassium chloride  SA (KLOR-CON  M) 20 MEQ tablet Take 1 tablet (20 mEq total) by mouth daily. 01/24/24 02/03/24  Cottie Donnice PARAS, MD  potassium chloride  SA (KLOR-CON  M) 20 MEQ tablet Take 1 tablet (20 mEq total) by mouth daily. 01/23/24   Cottie Donnice PARAS, MD  valACYclovir  (VALTREX ) 1000 MG tablet Take 1 tablet (1,000 mg total) by mouth daily. 01/06/24   Fleming, Zelda W, NP  SUMAtriptan  (IMITREX ) 50 MG tablet Take one for headache.  May repeat in 2 hours.  No more than 2 a day 05/27/17 12/09/21  Maranda Jamee Jacob, MD    Allergies: Amoxicillin , Cyclobenzaprine , Ibuprofen , Methocarbamol , and Penicillins    Review of Systems  Cardiovascular:  Positive for chest pain.  All other systems reviewed and are negative.   Updated Vital Signs BP 121/67 (BP Location: Left Arm)   Pulse 93   Temp 98.3 F (36.8 C) (Oral)  Resp 18   SpO2 98%   Physical Exam Constitutional:      General: He is not in acute distress.    Appearance: He is well-developed.     Comments: Patient is coughing in the room but in no acute respiratory discomfort  HENT:     Head: Atraumatic.  Eyes:     Conjunctiva/sclera: Conjunctivae normal.  Cardiovascular:     Rate and Rhythm: Normal rate and regular rhythm.     Pulses: Normal pulses.     Heart sounds: Normal heart sounds.  Pulmonary:     Effort: Pulmonary effort is normal.     Breath sounds: Normal breath  sounds. No wheezing, rhonchi or rales.  Abdominal:     Palpations: Abdomen is soft.     Tenderness: There is no abdominal tenderness.  Musculoskeletal:     Cervical back: Normal range of motion and neck supple.  Skin:    Findings: No rash.  Neurological:     Mental Status: He is alert and oriented to person, place, and time.     (all labs ordered are listed, but only abnormal results are displayed) Labs Reviewed  BASIC METABOLIC PANEL WITH GFR - Abnormal; Notable for the following components:      Result Value   Glucose, Bld 122 (*)    All other components within normal limits  CBC  TROPONIN T, HIGH SENSITIVITY  TROPONIN T, HIGH SENSITIVITY    EKG: None  Date: 02/23/2024  Rate: 94  Rhythm: normal sinus rhythm  QRS Axis: normal  Intervals: normal  ST/T Wave abnormalities: normal  Conduction Disutrbances: none  Narrative Interpretation:   Old EKG Reviewed: No significant changes noted    Radiology: DG Chest 2 View Result Date: 02/23/2024 CLINICAL DATA:  Chest pressure EXAM: CHEST - 2 VIEW COMPARISON:  January 20, 2024 FINDINGS: The heart size and mediastinal contours are within normal limits. Both lungs are clear. The visualized skeletal structures are unremarkable. IMPRESSION: Normal Electronically Signed   By: Nancyann Burns M.D.   On: 02/23/2024 10:37     Procedures   Medications Ordered in the ED - No data to display                                  Medical Decision Making  BP 110/83 (BP Location: Left Arm)   Pulse 88   Temp 98.7 F (37.1 C) (Oral)   Resp 18   Ht 5' 5 (1.651 m)   Wt 86.2 kg   SpO2 100%   BMI 31.62 kg/m   20:53 PM  54 year old Spanish-speaking male with history of asthma, anxiety, GERD, presenting to the ED with complaints of chest discomfort.  History obtained using language interpreter.  Patient states a month ago he was having cough and he was evaluated in the ER.  He was diagnosed with pneumonia.  He was subsequently discharged home  with medication.  He did report improvement of symptoms however for the past several days he has having similar symptoms.  Endorsed having persistent nonproductive cough, some chest discomfort, mild congestion.  He does not endorse any fever or chills no nausea, vomiting or diarrhea and specifically no shortness of breath.  He denies any significant cardiac history.  He denies alcohol or tobacco use.  No prior history of PE or DVT.  Does endorse feeling a bit hot.  Denies history of hypertension taking any ACE inhibitor.  Exam overall  reassuring.  Patient was coughing in the room but coughing is nonproductive.  His lungs are clear abdomen soft nontender heart with normal rate and rhythm.  -Labs ordered, independently viewed and interpreted by me.  Labs remarkable for normal delta troponin, heart score of 1 low risk of Mace.  Electrolyte panels are reassuring -The patient was maintained on a cardiac monitor.  I personally viewed and interpreted the cardiac monitored which showed an underlying rhythm of: Sinus rhythm -Imaging independently viewed and interpreted by me and I agree with radiologist's interpretation.  Result remarkable for chest x-ray unremarkable -This patient presents to the ED for concern of chest pain, this involves an extensive number of treatment options, and is a complaint that carries with it a high risk of complications and morbidity.  The differential diagnosis includes viral illness, post pneumonia cough, pleurisy, PE, pneumonia, PTX, GERD -Co morbidities that complicate the patient evaluation includes asthma, GERD, anxiety -Treatment includes reassurance -Reevaluation of the patient after these medicines showed that the patient stayed the same -PCP office notes or outside notes reviewed -Escalation to admission/observation considered: patients feels much better, is comfortable with discharge, and will follow up with PCP -Prescription medication considered, patient comfortable  with Tessalon , guaifenesin  -Social Determinant of Health considered which includes tobacco use      Final diagnoses:  Nonspecific chest pain  Subacute cough    ED Discharge Orders          Ordered    omeprazole  (PRILOSEC) 40 MG capsule  Daily        02/23/24 1437    benzonatate  (TESSALON ) 100 MG capsule  Every 8 hours        02/23/24 1437    benzonatate  (TESSALON ) 100 MG capsule  2 times daily PRN        02/23/24 1437    guaiFENesin  200 MG tablet  Every 4 hours PRN        02/23/24 1437               Nivia Colon, PA-C 02/23/24 1438  "

## 2024-02-23 NOTE — ED Notes (Signed)
PA at bedside assessing pt at this time.
